# Patient Record
Sex: Female | Born: 1975
Health system: Southern US, Community
[De-identification: ages and names within clinical notes are randomized; demographics above are authoritative.]

## PROBLEM LIST (undated history)

## (undated) DIAGNOSIS — R569 Unspecified convulsions: Secondary | ICD-10-CM

## (undated) DIAGNOSIS — I619 Nontraumatic intracerebral hemorrhage, unspecified: Secondary | ICD-10-CM

## (undated) DIAGNOSIS — D649 Anemia, unspecified: Secondary | ICD-10-CM

## (undated) DIAGNOSIS — I639 Cerebral infarction, unspecified: Secondary | ICD-10-CM

## (undated) NOTE — *Deleted (*Deleted)
ANTICOAGULATION CONSULT NOTE  Pharmacy Consult for Warfarin with Lovenox bridge Indication: Dural venous sinus thrombosis  No Known Allergies  Patient Measurements: Height: 5' (152.4 cm) Weight: 66.2 kg (145 lb 15.1 oz) IBW/kg (Calculated) : 45.5 Heparin Dosing Weight: 59.7 kg  Vital Signs: Temp: 99.8 F (37.7 C) (10/20 0822) Temp Source: Oral (10/20 0822) BP: 145/76 (10/20 0900) Pulse Rate: 41 (10/20 0900)  Labs: Recent Labs    04/06/20 0735 04/06/20 0735 04/06/20 2242 04/07/20 0055 04/07/20 0602 04/07/20 0850 04/08/20 0526  HGB 7.2*   < >  --   --  10.7*  --  10.5*  HCT 24.7*  --   --   --  34.1*  --  32.6*  PLT 462*  --   --   --  422*  --  387  HEPARINUNFRC 0.80*  --  0.28*  --   --  0.28*  --   CREATININE 0.62  --   --  0.62  --   --  0.58   < > = values in this interval not displayed.    Estimated Creatinine Clearance: 75.4 mL/min (by C-G formula based on SCr of 0.58 mg/dL).   Medical History: Past Medical History:  Diagnosis Date  . 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 INR 1.8 on admission which was reversed with Vit K. Currently on treatment dose Lovenox. Now resuming warfarin    H/H low stable, Plt wnl. INR ***  Home warfarin dose: 4 mg daily   Goal of Therapy:  INR 2-3 Monitor platelets by anticoagulation protocol: Yes   Plan:  Lovenox 65 mg (1 mg/kg) twice daily  Warfarin *** Monitor daily CBC and for s/s of bleeding    Vinnie Level, PharmD., BCPS, BCCCP Clinical Pharmacist Please refer to [facility] for unit-specific pharmacist

---

## 2004-05-18 ENCOUNTER — Ambulatory Visit (HOSPITAL_COMMUNITY): Admission: RE | Admit: 2004-05-18 | Discharge: 2004-05-18 | Payer: Self-pay | Admitting: Obstetrics and Gynecology

## 2004-09-10 ENCOUNTER — Ambulatory Visit: Payer: Self-pay

## 2005-04-01 ENCOUNTER — Observation Stay: Payer: Self-pay

## 2005-04-02 ENCOUNTER — Inpatient Hospital Stay: Payer: Self-pay

## 2007-05-05 ENCOUNTER — Emergency Department: Payer: Self-pay | Admitting: Emergency Medicine

## 2008-10-14 ENCOUNTER — Ambulatory Visit: Payer: Self-pay | Admitting: Family Medicine

## 2010-06-22 ENCOUNTER — Ambulatory Visit: Payer: Self-pay | Admitting: Family Medicine

## 2010-07-02 ENCOUNTER — Inpatient Hospital Stay (HOSPITAL_COMMUNITY)
Admission: AD | Admit: 2010-07-02 | Discharge: 2010-07-02 | Payer: Self-pay | Source: Home / Self Care | Attending: Obstetrics & Gynecology | Admitting: Obstetrics & Gynecology

## 2010-07-05 LAB — URINALYSIS, ROUTINE W REFLEX MICROSCOPIC
Bilirubin Urine: NEGATIVE
Ketones, ur: NEGATIVE mg/dL
Leukocytes, UA: NEGATIVE
Nitrite: NEGATIVE
Protein, ur: NEGATIVE mg/dL
Specific Gravity, Urine: 1.015 (ref 1.005–1.030)
Urine Glucose, Fasting: NEGATIVE mg/dL
Urobilinogen, UA: 0.2 mg/dL (ref 0.0–1.0)
pH: 6.5 (ref 5.0–8.0)

## 2010-07-05 LAB — URINE MICROSCOPIC-ADD ON

## 2010-07-05 LAB — POCT PREGNANCY, URINE: Preg Test, Ur: POSITIVE

## 2010-09-13 ENCOUNTER — Encounter: Payer: Self-pay | Admitting: Obstetrics and Gynecology

## 2011-02-03 ENCOUNTER — Observation Stay: Payer: Self-pay

## 2011-02-12 ENCOUNTER — Inpatient Hospital Stay: Payer: Self-pay

## 2013-09-13 ENCOUNTER — Ambulatory Visit: Payer: Self-pay | Admitting: Family Medicine

## 2020-03-25 ENCOUNTER — Emergency Department
Admission: EM | Admit: 2020-03-25 | Discharge: 2020-03-25 | Disposition: A | Discharge status: Home / Self Care | Payer: Medicaid Other | Attending: Emergency Medicine | Admitting: Emergency Medicine

## 2020-03-25 ENCOUNTER — Other Ambulatory Visit (HOSPITAL_COMMUNITY): Payer: Self-pay

## 2020-03-25 ENCOUNTER — Inpatient Hospital Stay (HOSPITAL_COMMUNITY): Payer: Medicaid Other

## 2020-03-25 ENCOUNTER — Emergency Department: Payer: Medicaid Other

## 2020-03-25 ENCOUNTER — Other Ambulatory Visit: Payer: Self-pay

## 2020-03-25 ENCOUNTER — Emergency Department (HOSPITAL_COMMUNITY): Payer: Medicaid Other

## 2020-03-25 ENCOUNTER — Inpatient Hospital Stay (HOSPITAL_COMMUNITY)
Admission: EM | Admit: 2020-03-25 | Discharge: 2020-03-31 | DRG: 064 | Disposition: A | Discharge status: Rehabilitation Facility | Payer: Medicaid Other | Source: Other Acute Inpatient Hospital | Attending: Neurology | Admitting: Neurology

## 2020-03-25 DIAGNOSIS — Z20822 Contact with and (suspected) exposure to covid-19: Secondary | ICD-10-CM | POA: Diagnosis present

## 2020-03-25 DIAGNOSIS — Z7289 Other problems related to lifestyle: Secondary | ICD-10-CM

## 2020-03-25 DIAGNOSIS — E663 Overweight: Secondary | ICD-10-CM | POA: Diagnosis not present

## 2020-03-25 DIAGNOSIS — J690 Pneumonitis due to inhalation of food and vomit: Secondary | ICD-10-CM

## 2020-03-25 DIAGNOSIS — G08 Intracranial and intraspinal phlebitis and thrombophlebitis: Secondary | ICD-10-CM | POA: Diagnosis present

## 2020-03-25 DIAGNOSIS — Z6828 Body mass index (BMI) 28.0-28.9, adult: Secondary | ICD-10-CM

## 2020-03-25 DIAGNOSIS — I619 Nontraumatic intracerebral hemorrhage, unspecified: Secondary | ICD-10-CM

## 2020-03-25 DIAGNOSIS — E785 Hyperlipidemia, unspecified: Secondary | ICD-10-CM | POA: Diagnosis not present

## 2020-03-25 DIAGNOSIS — R42 Dizziness and giddiness: Secondary | ICD-10-CM | POA: Insufficient documentation

## 2020-03-25 DIAGNOSIS — I612 Nontraumatic intracerebral hemorrhage in hemisphere, unspecified: Secondary | ICD-10-CM

## 2020-03-25 DIAGNOSIS — R4182 Altered mental status, unspecified: Secondary | ICD-10-CM | POA: Diagnosis present

## 2020-03-25 DIAGNOSIS — I611 Nontraumatic intracerebral hemorrhage in hemisphere, cortical: Secondary | ICD-10-CM | POA: Diagnosis present

## 2020-03-25 DIAGNOSIS — H5347 Heteronymous bilateral field defects: Secondary | ICD-10-CM | POA: Diagnosis not present

## 2020-03-25 DIAGNOSIS — I677 Cerebral arteritis, not elsewhere classified: Secondary | ICD-10-CM | POA: Insufficient documentation

## 2020-03-25 DIAGNOSIS — R110 Nausea: Secondary | ICD-10-CM | POA: Diagnosis not present

## 2020-03-25 DIAGNOSIS — D649 Anemia, unspecified: Secondary | ICD-10-CM | POA: Diagnosis present

## 2020-03-25 DIAGNOSIS — G936 Cerebral edema: Secondary | ICD-10-CM | POA: Diagnosis present

## 2020-03-25 DIAGNOSIS — D6859 Other primary thrombophilia: Secondary | ICD-10-CM | POA: Diagnosis present

## 2020-03-25 DIAGNOSIS — H532 Diplopia: Secondary | ICD-10-CM | POA: Diagnosis present

## 2020-03-25 DIAGNOSIS — G441 Vascular headache, not elsewhere classified: Secondary | ICD-10-CM | POA: Diagnosis not present

## 2020-03-25 DIAGNOSIS — R569 Unspecified convulsions: Secondary | ICD-10-CM | POA: Diagnosis present

## 2020-03-25 DIAGNOSIS — R2689 Other abnormalities of gait and mobility: Secondary | ICD-10-CM | POA: Diagnosis not present

## 2020-03-25 DIAGNOSIS — I636 Cerebral infarction due to cerebral venous thrombosis, nonpyogenic: Principal | ICD-10-CM | POA: Diagnosis present

## 2020-03-25 DIAGNOSIS — I6389 Other cerebral infarction: Secondary | ICD-10-CM | POA: Diagnosis not present

## 2020-03-25 DIAGNOSIS — R519 Headache, unspecified: Secondary | ICD-10-CM | POA: Diagnosis present

## 2020-03-25 DIAGNOSIS — E876 Hypokalemia: Secondary | ICD-10-CM | POA: Diagnosis present

## 2020-03-25 DIAGNOSIS — R197 Diarrhea, unspecified: Secondary | ICD-10-CM | POA: Diagnosis not present

## 2020-03-25 DIAGNOSIS — I823 Embolism and thrombosis of renal vein: Secondary | ICD-10-CM | POA: Diagnosis present

## 2020-03-25 DIAGNOSIS — R7401 Elevation of levels of liver transaminase levels: Secondary | ICD-10-CM | POA: Diagnosis not present

## 2020-03-25 DIAGNOSIS — I8290 Acute embolism and thrombosis of unspecified vein: Secondary | ICD-10-CM

## 2020-03-25 DIAGNOSIS — E782 Mixed hyperlipidemia: Secondary | ICD-10-CM | POA: Diagnosis not present

## 2020-03-25 DIAGNOSIS — I610 Nontraumatic intracerebral hemorrhage in hemisphere, subcortical: Secondary | ICD-10-CM | POA: Diagnosis not present

## 2020-03-25 HISTORY — DX: Intracranial and intraspinal phlebitis and thrombophlebitis: G08

## 2020-03-25 HISTORY — DX: Nontraumatic intracerebral hemorrhage, unspecified: I61.9

## 2020-03-25 LAB — CBC WITH DIFFERENTIAL/PLATELET
Abs Immature Granulocytes: 0.03 10*3/uL (ref 0.00–0.07)
Basophils Absolute: 0 10*3/uL (ref 0.0–0.1)
Basophils Relative: 0 %
Eosinophils Absolute: 0 10*3/uL (ref 0.0–0.5)
Eosinophils Relative: 0 %
HCT: 28.7 % — ABNORMAL LOW (ref 36.0–46.0)
Hemoglobin: 9.2 g/dL — ABNORMAL LOW (ref 12.0–15.0)
Immature Granulocytes: 0 %
Lymphocytes Relative: 22 %
Lymphs Abs: 2.2 10*3/uL (ref 0.7–4.0)
MCH: 22.5 pg — ABNORMAL LOW (ref 26.0–34.0)
MCHC: 32.1 g/dL (ref 30.0–36.0)
MCV: 70.2 fL — ABNORMAL LOW (ref 80.0–100.0)
Monocytes Absolute: 0.5 10*3/uL (ref 0.1–1.0)
Monocytes Relative: 5 %
Neutro Abs: 7.2 10*3/uL (ref 1.7–7.7)
Neutrophils Relative %: 73 %
Platelets: 422 10*3/uL — ABNORMAL HIGH (ref 150–400)
RBC: 4.09 MIL/uL (ref 3.87–5.11)
RDW: 18.7 % — ABNORMAL HIGH (ref 11.5–15.5)
WBC: 9.9 10*3/uL (ref 4.0–10.5)
nRBC: 0 % (ref 0.0–0.2)

## 2020-03-25 LAB — GLUCOSE, CAPILLARY: Glucose-Capillary: 95 mg/dL (ref 70–99)

## 2020-03-25 LAB — APTT: aPTT: 24 seconds (ref 24–36)

## 2020-03-25 LAB — COMPREHENSIVE METABOLIC PANEL
ALT: 55 U/L — ABNORMAL HIGH (ref 0–44)
AST: 48 U/L — ABNORMAL HIGH (ref 15–41)
Albumin: 3.9 g/dL (ref 3.5–5.0)
Alkaline Phosphatase: 53 U/L (ref 38–126)
Anion gap: 9 (ref 5–15)
BUN: 10 mg/dL (ref 6–20)
CO2: 20 mmol/L — ABNORMAL LOW (ref 22–32)
Calcium: 8.8 mg/dL — ABNORMAL LOW (ref 8.9–10.3)
Chloride: 106 mmol/L (ref 98–111)
Creatinine, Ser: 0.6 mg/dL (ref 0.44–1.00)
GFR calc non Af Amer: >60 mL/min (ref >60)
Glucose, Bld: 99 mg/dL (ref 70–99)
Potassium: 3.2 mmol/L — ABNORMAL LOW (ref 3.5–5.1)
Sodium: 135 mmol/L (ref 135–145)
Total Bilirubin: 0.4 mg/dL (ref 0.3–1.2)
Total Protein: 7.4 g/dL (ref 6.5–8.1)

## 2020-03-25 LAB — PROTIME-INR
INR: 1.1 (ref 0.8–1.2)
Prothrombin Time: 13.5 seconds (ref 11.4–15.2)

## 2020-03-25 LAB — ANTITHROMBIN III: AntiThromb III Func: 100 % (ref 75–120)

## 2020-03-25 MED ORDER — CLEVIDIPINE BUTYRATE 0.5 MG/ML IV EMUL: 0.0000 mg/h | INTRAVENOUS | Status: DC

## 2020-03-25 MED ORDER — FENTANYL CITRATE (PF) 100 MCG/2ML IJ SOLN
50.0000 ug | INTRAMUSCULAR | Status: DC | PRN
  Administered 2020-03-25 (×2): 50 ug via INTRAVENOUS
  Filled 2020-03-25 (×2): qty 2

## 2020-03-25 MED ORDER — LORAZEPAM 2 MG/ML IJ SOLN
INTRAMUSCULAR | Status: AC
  Filled 2020-03-25: qty 1

## 2020-03-25 MED ORDER — HEPARIN (PORCINE) 25000 UT/250ML-% IV SOLN
900.0000 [IU]/h | INTRAVENOUS | Status: DC
  Administered 2020-03-25: 900 [IU]/h via INTRAVENOUS
  Filled 2020-03-25: qty 250

## 2020-03-25 MED ORDER — SENNOSIDES-DOCUSATE SODIUM 8.6-50 MG PO TABS
1.0000 | ORAL_TABLET | Freq: Two times a day (BID) | ORAL | Status: DC
  Administered 2020-03-26 – 2020-03-31 (×10): 1 via ORAL
  Filled 2020-03-25 (×10): qty 1

## 2020-03-25 MED ORDER — ACETAMINOPHEN 160 MG/5ML PO SOLN: 650.0000 mg | ORAL | Status: DC | PRN

## 2020-03-25 MED ORDER — LORAZEPAM 2 MG/ML IJ SOLN
2.0000 mg | Freq: Once | INTRAMUSCULAR | Status: AC
  Administered 2020-03-25: 2 mg via INTRAVENOUS

## 2020-03-25 MED ORDER — PANTOPRAZOLE SODIUM 40 MG IV SOLR
40.0000 mg | Freq: Every day | INTRAVENOUS | Status: DC
  Administered 2020-03-26: 40 mg via INTRAVENOUS
  Filled 2020-03-25: qty 40

## 2020-03-25 MED ORDER — ACETAMINOPHEN 325 MG PO TABS
650.0000 mg | ORAL_TABLET | ORAL | Status: DC | PRN
  Administered 2020-03-26 – 2020-03-31 (×17): 650 mg via ORAL
  Filled 2020-03-25 (×17): qty 2

## 2020-03-25 MED ORDER — ACETAMINOPHEN 325 MG PO TABS: 650.0000 mg | ORAL_TABLET | Freq: Four times a day (QID) | ORAL | Status: DC

## 2020-03-25 MED ORDER — POTASSIUM CHLORIDE 10 MEQ/100ML IV SOLN
10.0000 meq | INTRAVENOUS | Status: AC
  Administered 2020-03-25 – 2020-03-26 (×4): 10 meq via INTRAVENOUS
  Filled 2020-03-25 (×4): qty 100

## 2020-03-25 MED ORDER — ACETAMINOPHEN 650 MG RE SUPP: 650.0000 mg | RECTAL | Status: DC | PRN

## 2020-03-25 MED ORDER — FENTANYL CITRATE (PF) 100 MCG/2ML IJ SOLN
50.0000 ug | Freq: Once | INTRAMUSCULAR | Status: AC
Start: 2020-03-25 — End: 2020-03-25
  Administered 2020-03-25: 50 ug via INTRAVENOUS
  Filled 2020-03-25: qty 2

## 2020-03-25 MED ORDER — GADOBUTROL 1 MMOL/ML IV SOLN
6.0000 mL | Freq: Once | INTRAVENOUS | Status: AC | PRN
  Administered 2020-03-25: 6 mL via INTRAVENOUS

## 2020-03-25 MED ORDER — LEVETIRACETAM IN NACL 500 MG/100ML IV SOLN
500.0000 mg | Freq: Two times a day (BID) | INTRAVENOUS | Status: DC
  Administered 2020-03-26 – 2020-03-27 (×3): 500 mg via INTRAVENOUS
  Filled 2020-03-25 (×3): qty 100

## 2020-03-25 MED ORDER — LABETALOL HCL 5 MG/ML IV SOLN: 20.0000 mg | Freq: Once | INTRAVENOUS | Status: DC

## 2020-03-25 MED ORDER — ONDANSETRON HCL 4 MG/2ML IJ SOLN
4.0000 mg | Freq: Once | INTRAMUSCULAR | Status: AC
  Administered 2020-03-25: 4 mg via INTRAVENOUS
  Filled 2020-03-25: qty 2

## 2020-03-25 MED ORDER — STROKE: EARLY STAGES OF RECOVERY BOOK: Freq: Once | Status: DC

## 2020-03-25 NOTE — ED Provider Notes (Signed)
[facility] Emergency Department Provider Note  Time seen: 2:42 PM  I have reviewed the triage vital signs and the nursing notes. Spanish interpreter used for this evaluation  HISTORY  Chief Complaint Headache   HPI LEANNE SISLER is a 44 y.o. female with no past medical history who presents to the emergency department for 3 days a headache along with dizziness this morning.   According to the patient for the past 3 days she has been experiencing a moderate to significant headache, has been using over-the-counter medications without relief.  Patient states this morning the pain became worse along with blurred vision from the right eye, dizziness.  Patient denies any vomiting.  Denies any fever.  No focal weakness or numbness.  No difficulty speaking thinking or ambulating per patient.  History reviewed. No pertinent past medical history.  There are no problems to display for this patient.   History reviewed. No pertinent surgical history.  Prior to Admission medications   Not on File    No Known Allergies  No family history on file.  Social History Social History   Tobacco Use  . Smoking status: Never Smoker  . Smokeless tobacco: Never Used  Substance Use Topics  . Alcohol use: Not Currently  . Drug use: Never    Review of Systems Constitutional: Negative for fever.  Positive for dizziness. Cardiovascular: Negative for chest pain. Respiratory: Negative for shortness of breath. Gastrointestinal: Negative for abdominal pain, vomiting  Musculoskeletal: Negative for musculoskeletal complaints Skin: Negative for skin complaints  Neurological: Significant headache x3 days, worse this morning All other ROS negative  ____________________________________________   PHYSICAL EXAM:  VITAL SIGNS: ED Triage Vitals  Enc Vitals Group     BP 03/25/20 1228 124/63     Pulse Rate 03/25/20 1228 68     Resp 03/25/20 1228 20     Temp 03/25/20 1228 98.9  F (37.2 C)     Temp Source 03/25/20 1228 Oral     SpO2 03/25/20 1228 100 %     Weight 03/25/20 1239 140 lb (63.5 kg)     Height 03/25/20 1239 4' 11.06" (1.5 m)     Head Circumference --      Peak Flow --      Pain Score 03/25/20 1239 8     Pain Loc --      Pain Edu? --      Excl. in [place]? --     Constitutional: Alert and oriented. Well appearing and in no distress. Eyes: Normal exam ENT      Head: Normocephalic and atraumatic.      Mouth/Throat: Mucous membranes are moist. Cardiovascular: Normal rate, regular rhythm. No murmur Respiratory: Normal respiratory effort without tachypnea nor retractions. Breath sounds are clear  Gastrointestinal: Soft and nontender. No distention.   Musculoskeletal: Nontender with normal range of motion in all extremities.  Neurologic:  Normal speech and language. No gross focal neurologic deficits Skin:  Skin is warm, dry and intact.  Psychiatric: Mood and affect are normal.  ____________________________________________    EKG  EKG viewed and interpreted by myself shows a normal sinus rhythm at 63 bpm with a narrow QRS, normal axis, normal intervals, nonspecific ST changes.  ____________________________________________    RADIOLOGY  CT scan of the head shows hemorrhagic process in the left parietal brain with surrounding edema.  MRI of the brain shows what appears to be most consistent with dural sinus thrombosis with hemorrhagic conversion of an infarct  ____________________________________________  INITIAL IMPRESSION / ASSESSMENT AND PLAN / ED COURSE  Pertinent labs & imaging results that were available during my care of the patient were reviewed by me and considered in my medical decision making (see chart for details).   Patient presents to the emergency department for 3 days of headache acutely worse today along with dizziness.  CT scan of the head is concerning for possible hemorrhagic process recommend MRI.  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  Spoke to neurosurgery as well as neurology.  Neurosurgery (Dr. Cari Caraway) has been down to see the patient.  Neurology recommends transfer to neuro ICU at [facility].  She is currently speaking to her colleague [facility] to help arrange for transfer.  Neurology consultation pending for further recommendations.  Patient care signed out to Dr. Archie Balboa.  Maudean I Fasig was evaluated in Emergency Department on 03/25/2020 for the symptoms described in the history of present illness. She was evaluated in the context of the global COVID-19 pandemic, which necessitated consideration that the patient might be at risk for infection with the SARS-CoV-2 virus that causes COVID-19. Institutional protocols and algorithms that pertain to the evaluation of patients at risk for COVID-19 are in a state of rapid change based on information released by regulatory bodies including the CDC and federal and state organizations. These policies and algorithms were followed during the patient's care in the ED.  CRITICAL CARE Performed by: Harvest Dark   Total critical care time: 30 minutes  Critical care time was exclusive of separately billable procedures and treating other patients.  Critical care was necessary to treat or prevent imminent or life-threatening deterioration.  Critical care was time spent personally by me on the following activities: development of treatment plan with patient and/or surrogate as well as nursing, discussions with consultants, evaluation of patient's response to treatment, examination of patient, obtaining history from patient or surrogate, ordering and performing treatments and interventions, ordering and review of laboratory studies, ordering and review of radiographic studies, pulse oximetry and re-evaluation of patient's  condition.  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

## 2020-03-25 NOTE — Progress Notes (Signed)
vLTM setup same leads used for baseline EEG today.  Neurology notified.  Event button tested.

## 2020-03-25 NOTE — ED Notes (Signed)
Dr. Ralene Bathe noted pt to be seizing, 2mg  ativan and 1500mg  keppra given IV per verbal order. NRB applied, pt with snoring respirations, pt transported to CT with this RN and Dr. Ralene Bathe.

## 2020-03-25 NOTE — ED Triage Notes (Signed)
Pt arrived via [facility] from [facility], pt with headache. Heparin infusing @ 900 units/hr. Pt alert, having difficulty following commands.

## 2020-03-25 NOTE — Progress Notes (Cosign Needed)
EEG complete - results pending 

## 2020-03-25 NOTE — ED Triage Notes (Signed)
Pt to the er for headache x 3 days. Pt has been taking IBU with no relief. Pt states pain is sever and has difficulty with vision in the right eye. No medical hx

## 2020-03-25 NOTE — Consult Note (Signed)
Referring Physician:  No referring provider defined for this encounter.  Primary Physician:  [facility]  Chief Complaint:  headache  History of Present Illness: 03/25/2020 Erica Rosario is a 44 y.o. female who presents with the chief complaint of headache for several days, worsening today.  She was brought in for evaluation, and found to have a likely venous infarct and concern for dural sinus thrombosis of the superior sagittal sinus.  She reports some issues with her vision on the R, but feels her thinking is normal. She does report nausea and vomiting.   Review of Systems:  A 10 point review of systems is negative, except for the pertinent positives and negatives detailed in the HPI.  Past Medical History: History reviewed. No pertinent past medical history.  Past Surgical History: History reviewed. No pertinent surgical history.  Allergies: Allergies as of 03/25/2020  . (No Known Allergies)    Medications: No current facility-administered medications for this encounter. No current outpatient medications on file.   Social History: Social History   Tobacco Use  . Smoking status: Never Smoker  . Smokeless tobacco: Never Used  Substance Use Topics  . Alcohol use: Not Currently  . Drug use: Never    Family Medical History: No family history on file.  Physical Examination: Vitals:   03/25/20 1320 03/25/20 1441  BP: 113/69 (!) 113/54  Pulse: (!) 58 61  Resp: 13 17  Temp:    SpO2: 100% 100%     General: Patient is well developed, well nourished, calm, collected, and in mild apparent distress.  Psychiatric: Patient is non-anxious.  Head:  Pupils equal, round, and reactive to light.  ENT:  Oral mucosa appears well hydrated.  Neck:   Supple.  Full range of motion.  Respiratory: Patient is breathing without any difficulty.  Extremities: No edema.  Vascular: Palpable pulses in dorsal pedal vessels.  Skin:   On exposed  skin, there are no abnormal skin lesions.  NEUROLOGICAL:  General: In mild distress.  OE spontaneously. Follows commands. Awake, alert, oriented to person, place, and time.  Pupils equal round and reactive to light.  Facial tone is symmetric.  Tongue protrusion is midline.  There is no pronator drift.  VF shows deficit in the mid and lower R visual fields of both eyes.  Strength: Side Biceps Triceps Deltoid Interossei Grip Wrist Ext. Wrist Flex.  R 5 5 5 5 5 5 5   L 5 5 5 5 5 5 5    Side Iliopsoas Quads Hamstring PF DF EHL  R 5 5 5 5 5 5   L 5 5 5 5 5 5    Reflexes are 1+ and symmetric at the biceps, triceps, brachioradialis, patella and achilles.   Bilateral upper and lower extremity sensation is intact to light touch.  Gait is untested. Hoffman's is absent.  Imaging: MRI brain 03/25/20 IMPRESSION: 1. Thrombosis of the posterior aspect of the superior sagittal sinus with hemorrhagic transformation of a venous infarct in the left occipital lobe, as detailed above. There is thrombosis of a draining cortical vein in the region of hemorrhage, as detailed above. 2. There are two rounded areas of enhancement within the left occipital hemorrhage which may represent active/ongoing hemorrhage. 3. When compared to same day CT, similar surrounding edema/mass effect with slight rightward midline shift.  Critical Value/emergent results were called by telephone at the time of interpretation on 03/25/2020 at 2:38 pm to provider [facility] , who verbally acknowledged these results.  Electronically Signed   By: Margaretha Sheffield MD   On: 03/25/2020 14:51  I have personally reviewed the images and agree with the above interpretation.  Labs: CBC Latest Ref Rng & Units 03/25/2020  WBC 4.0 - 10.5 K/uL 9.9  Hemoglobin 12.0 - 15.0 g/dL 9.2(L)  Hematocrit 36 - 46 % 28.7(L)  Platelets 150 - 400 K/uL 422(H)       Assessment and Plan: [name] is a pleasant 43 y.o. 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 She has a R VF cut.  I defer management to my colleagues in neurology.  Even with evidence of intraparenchymal hemorrhage, therapeutic anticoagulation is typically the recommended treatment, though I defer to neurology for definitive management.    I would not recommend surgical intervention.    Per the neurologist's recommendation, the patient may end up transferring to a dedicated neuro ICU.   Mianna Iezzi K. Izora Ribas MD, [place] Dept. of Neurosurgery

## 2020-03-25 NOTE — ED Notes (Signed)
Called [facility] spoke with [place] ref. Potential transfer

## 2020-03-25 NOTE — Consult Note (Addendum)
Neurology Consultation Reason for Consult: CVST  Referring Physician: Dr. Harvest Dark  CC: Headache   History is obtained from: Patient and chart review   HPI: Erica Rosario is a 44 y.o. female with no significant past medical history.  She has been having headaches for the past 3 days with associated neck stiffness.  The headaches do wake her up at night out of sleep.  She does think that they are better laying down than sitting up.  She has been having some blurry vision and some double vision when looking to the right.  She denies any pulsatile tinnitus.  She denies any fevers, chills, sweats, cough, cold, rashes, urinary symptoms, bowel or bladder incontinence, constipation.  She denies any prior history of clotting disorder.  She denies any family history of clotting disorder.  She does not think she is pregnant but her next menstrual period is due to start in the next 3 days.  She states she is not sexually active.  She is not taking any medications other than pain medications for the headache which she states do help.  She received 2 doses of the Pfizer vaccine for COVID-19 back in March 2021  LKW: 3 days ago  tPA given?: No, due to hemorrhage  Premorbid modified rankin scale:      0 - No symptoms.  ICH Score: 0  Time performed: 5:00 pm on 10/6 GCS: 13-15 is 0 points Infratentorial: No.. If yes, 1 point Volume: <30cc is 0 points  Age: 44 y.o.. >80 is 1 point Intraventricular extension is 1 point A Score of 0 points has a 30 day mortality of 0%. Stroke. 2001 Apr;32(4):891-7.   ROS: A 14 point ROS was performed and is negative except as noted in the HPI.  History reviewed. No pertinent past medical history.  No family history on file. She specifically denies any family history of clotting disorder She reports her mother passed away of a stroke at age 49  Social History:  reports that she has never smoked. She has never used smokeless tobacco. She reports previous alcohol  use. She reports that she does not use drugs.   Exam: Current vital signs: BP (!) 113/54   Pulse 61   Temp 98.9 F (37.2 C) (Oral)   Resp 17   Ht 4' 11.06" (1.5 m)   Wt 63.5 kg   LMP 02/24/2020   SpO2 100%   BMI 28.22 kg/m  Vital signs in last 24 hours: Temp:  [98.9 F (37.2 C)] 98.9 F (37.2 C) (10/06 1228) Pulse Rate:  [58-68] 61 (10/06 1441) Resp:  [13-20] 17 (10/06 1441) BP: (113-124)/(54-69) 113/54 (10/06 1441) SpO2:  [100 %] 100 % (10/06 1441) Weight:  [63.5 kg] 63.5 kg (10/06 1239)   Physical Exam  Constitutional: Appears well-developed and well-nourished.  Psych: Affect appropriate to situation Eyes: No scleral injection HENT: No OP obstrucion MSK: no joint deformities.  Cardiovascular: Normal rate and regular rhythm.  Respiratory: Effort normal, non-labored breathing GI: Soft.  No distension. There is no tenderness.  Skin: WDI  Neuro: Mental Status: Patient is awake, alert, oriented to person, place, month, year, and situation. Patient is able to give a clear and coherent history. No signs of aphasia or neglect Cranial Nerves: II: Visual Fields are full. Pupils are equal, round, and reactive to light.   III,IV, VI: EOMI without ptosis; mild diplopia on right gaze  V: Facial sensation is symmetric to temperature VII: Facial movement is symmetric.  VIII: hearing is  intact to voice X: Uvula elevates symmetrically XI: Shoulder shrug is symmetric. XII: tongue is midline without atrophy or fasciculations.  Motor: Tone is normal. Bulk is normal. 5/5 strength was present in all four extremities.  Sensory: Sensation is symmetric to light touch and temperature in the arms and legs. Deep Tendon Reflexes: 2+ and symmetric in the biceps and patellae.  Cerebellar: FNF and HKS are intact bilaterally   I have reviewed labs in epic and the results pertinent to this consultation are: PT 13.5 INR 1.1 PTT 24 Hemoglobin 9.2, microcytic anemia, elevated RDW,   platelets 422  I have personally reviewed the images obtained:  Head CT with hemorrhage not respecting a typical vascular distribution MRI brain with without contrast with cerebral venous thrombosis underlying a stable hemorrhage  Impression: 44 year old woman with no significant past medical history presenting with a cerebral venous sinus thrombosis creating hemorrhage.  Patient was counseled that despite the cerebral hemorrhage she will need heparin to resolve the hemorrhage as the etiology of the hemorrhage is poor cerebral blood flow drainage due to the clot.  Hypercoagulable panel was collected prior to initiating heparin to optimize diagnostic yield.  At this time she does not report any subacute symptoms and her basic laboratory symptoms are reassuring, so I have not immediately pursued malignancy work-up, though this should be considered if the other work-up is unrevealing  Recommendations:  # CVST  -Neuro ICU admission to the stroke service at [facility] discussed with Dr. Cheral Marker and Dr. Malen Gauze -Systolic blood pressure goal less than 140 -Hypercoagulable panel -Follow-up pregnancy test -B12 and MMA given elevated RDW -Low goal no bolus heparin drip requested via pharmacy consult -Every hour neurological checks -Repeat head CT at 8 PM  # Pain control  -Reasonable to temporarily control headaches with opiates as needed, but please avoid high doses to avoid clotting neurological exam -Schedule Tylenol 650 mg every 6 hours for 2 days to help with multimodal pain control and reduce opiate burden  Patient is Spanish-speaking only and an interpreter was used to aid in performing the examination as well as explaining the diagnosis and treatment plan.  All of the patient's questions were answered. 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

## 2020-03-25 NOTE — H&P (Signed)
Neurology H&P - [place] admission   CC: headache  History is obtained from: Chart, tele neurology consult at [facility]  HPI: Erica Rosario is a 44 y.o. female no PMH, presented to [facility] regional hospital this afternoon with complaints of headache and neck stiffness.  A code stroke was activated-she was seen by Dr. Curly Shores of our service.  She reported that headaches had been going on for 3 days with associated neck stiffness.  Headaches did wake her up at nights.  Headaches were better laying down and worsened and standing up.  Also complained of blurred and double vision mostly looking to the right.  No tinnitus.  No fevers chills cough cold rashes urinary symptoms bowel or bladder incontinence.  No bleeding or clotting disorders.  No family history of clotting disorders.  Not sexually active.  Not taking any medications other than the pain medications headache. Has been vaccinated against COVID-19 in March 2021.  Brain imaging revealed ICH which is close to the superior left parietal lobe in proximity with the venous drainage making suspicion for a venous infarct that might have bled. MRI brain confirmed the bleed and also revealed a cerebral venous thrombosis on superior sagittal sinus higher up with a thrombus also in the draining cortical vein.  Transferred from the ER at [facility] to ER at [facility] as no ICU beds are available due to critical national shortage of beds.  I was asked to see and admit the patient.  As she was being evaluated by the ED providers in the ED at [facility], she had a witnessed tonic-clonic seizure for which she received 2 mg of Ativan and was loaded with Keppra.  On initial examination by ED providers here, she was not consistently following commands and was drowsy.  Examination at [facility] was essentially unremarkable and nearly normal.   Repeat head CT at [facility] showed essentially stable hemorrhagic transformed venous infarction in the  left parietal region with a possible single new focus of hemorrhage at the vertex along with somewhat hyperdense superior sagittal sinus.   LKW: 3 days ago tpa given?: no, ICH Premorbid modified Rankin scale (mRS): 0  ROS: A 14 point ROS was performed and is negative except as noted in the HPI.  Unable to obtain due to altered mental status.   No past medical history on file.  No family history on file.   Social History:   reports that she has never smoked. She has never used smokeless tobacco. She reports previous alcohol use. She reports that she does not use drugs.  Medications  Current Facility-Administered Medications:  .  LORazepam (ATIVAN) 2 MG/ML injection, , , ,  .  LORazepam (ATIVAN) injection 2 mg, 2 mg, Intravenous, Once, Quintella Reichert, MD No current outpatient medications on file.   Exam: Current vital signs: BP 112/64 (BP Location: Left Arm)   Pulse 88   Resp (!) 24   LMP 02/24/2020   SpO2 98%  Vital signs in last 24 hours: Temp:  [98 F (36.7 C)-98.9 F (37.2 C)] 98 F (36.7 C) (10/06 2041) Pulse Rate:  [50-88] 88 (10/06 2140) Resp:  [13-26] 24 (10/06 2140) BP: (108-128)/(54-69) 112/64 (10/06 2140) SpO2:  [97 %-100 %] 98 % (10/06 2140) Weight:  [63.5 kg] 63.5 kg (10/06 1239) I saw the patient immediately after she had a seizure and was given Ativan. General: Obtunded, responds only to noxious stimulation as below. HEENT: Normocephalic atraumatic CVS: Regular rate rhythm Respiratory: Maintaining airway, breathing  well saturating normally on room air with normal breath sounds. Abdomen nondistended nontender Extremities warm well perfused Neurological exam She is obtunded, does not open eyes to voice or noxious stimulation. Does not follow any commands Cranial nerves: Pupils equal round reactive to light, extraocular movements difficult to assess, no obvious gaze deviation or preference, face appears symmetric. Motor exam: To noxious immolation,  there is strong withdrawal on the left but a weaker withdrawal on the right. Sensory exam: As above Coordination cannot be assessed   Labs I have reviewed labs in epic and the results pertinent to this consultation are:  CBC    Component Value Date/Time   WBC 9.9 03/25/2020 1229   RBC 4.09 03/25/2020 1229   HGB 9.2 (L) 03/25/2020 1229   HCT 28.7 (L) 03/25/2020 1229   PLT 422 (H) 03/25/2020 1229   MCV 70.2 (L) 03/25/2020 1229   MCH 22.5 (L) 03/25/2020 1229   MCHC 32.1 03/25/2020 1229   RDW 18.7 (H) 03/25/2020 1229   LYMPHSABS 2.2 03/25/2020 1229   MONOABS 0.5 03/25/2020 1229   EOSABS 0.0 03/25/2020 1229   BASOSABS 0.0 03/25/2020 1229    CMP     Component Value Date/Time   NA 135 03/25/2020 1229   K 3.2 (L) 03/25/2020 1229   CL 106 03/25/2020 1229   CO2 20 (L) 03/25/2020 1229   GLUCOSE 99 03/25/2020 1229   BUN 10 03/25/2020 1229   CREATININE 0.60 03/25/2020 1229   CALCIUM 8.8 (L) 03/25/2020 1229   PROT 7.4 03/25/2020 1229   ALBUMIN 3.9 03/25/2020 1229   AST 48 (H) 03/25/2020 1229   ALT 55 (H) 03/25/2020 1229   ALKPHOS 53 03/25/2020 1229   BILITOT 0.4 03/25/2020 1229   GFRNONAA >60 03/25/2020 1229      Imaging I have reviewed the images obtained:  CT-scan of the brain-from [place] [facility] revealing ICH in the left parietal lobe and an atypical vascular distribution concerning for a venous infarct with hemorrhagic transformation. MRI brain confirms the bleed and also MRI brain with contrast shows a superior sagittal sinus thrombus along with a cortical vein thrombus on the left which probably caused the venous infarct and that had hemorrhagic transformation. Repeat CT head at [place] [facility] unchanged. Obtaining stat CT head at [facility] due to the recent seizure.   Assessment:  44 year old with no significant past medical history coming in with 3 days worth of headaches and noted to have an atypical looking intracerebral hemorrhage which likely is  hemorrhagic transformation of a venous infarct because the MRI imaging with contrast shows dural venous sinus thrombosis the superior sagittal sinus and a draining cortical vein. Unclear etiology-has no significant past medical history. Started on heparin drip at the outside hospital and transferred to [facility] for higher level of care. While at [facility] ER as she arrived, she had a witnessed generalized tonic-clonic seizure for which she received Ativan and was loaded with Keppra.   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 Based on EEG results we will decide if need second antiepileptic. Closely monitor for further seizure activity clinically. Low threshold for intubation. Possible aspiration pneumonia due to seizure and depressed level of consciousness-we will do chest x-ray.  Hold off antibiotics for now.  Blood pressure goal: Systolic blood pressure less than 140. Given this very tricky situation with an [place] along with dural venous sinus thrombosis, she will need to be on a heparin drip-no bolus low heparin level goal. Low threshold for repeat CT with any neuro change. Check labs in the morning Check toxicology screen Replete electrolytes as necessary N.p.o. until cleared by bedside swallow formal swallow evaluation  Full code  Present on admission: ICH, acute ischemic stroke, dural venous sinus thrombosis, seizures, status epilepticus, possible aspiration pneumonia.  Husband arrived later-spoke with him via telemetry interpreter for [place].  Answered all questions.  -- Amie Portland, MD [facility] Pager: [phone] If 7pm to 7am, please call on call as  listed on AMION.   CRITICAL CARE ATTESTATION Performed by: Amie Portland, MD Total critical care time: 17minutes Critical care time was exclusive of separately billable procedures and treating other patients and/or supervising APPs/Residents/Students Critical care was necessary to treat or prevent imminent or life-threatening deterioration due to dural venous sinus thrombosis, intracerebral hemorrhage, seizures, status epilepticus This patient is critically ill and at significant risk for neurological worsening and/or death and care requires constant monitoring. Critical care was time spent personally by me on the following activities: development of treatment plan with patient and/or surrogate as well as nursing, discussions with consultants, evaluation of patient's response to treatment, examination of patient, obtaining history from patient or surrogate, ordering and performing treatments and interventions, ordering and review of laboratory studies, ordering and review of radiographic studies, pulse oximetry, re-evaluation of patient's condition, participation in multidisciplinary rounds and medical decision making of high complexity in the care of this patient.

## 2020-03-25 NOTE — ED Notes (Signed)
Pt responsive to pain at this time, no seizure activity noted, EEG tech at bedside.

## 2020-03-25 NOTE — ED Provider Notes (Signed)
[facility] EMERGENCY DEPARTMENT Provider Note   CSN: [id number] Arrival date & time: 03/25/20  2138     History Chief Complaint  Patient presents with  . Headache    Erica Rosario is a 44 y.o. female.  The history is provided by the patient and medical records. A language interpreter was used.  Headache  Erica Rosario is a 44 y.o. female who presents to the Emergency Department complaining of headache, dural sinus thrombosis. Level V caveat due to acuity of condition and language barrier. She presents the emergency department as a transfer from [facility] for dural sinus thrombosis with hemorrhagic transformation. She was started on heparin drip. Patient complained of headache to critical care transfer service. On ED arrival patient unable to provide meaningful communication.    No past medical history on file.  Patient Active Problem List   Diagnosis Date Noted  . ICH (intracerebral hemorrhage) ([place]) 03/25/2020    No past surgical history on file.   OB History   No obstetric history on file.     No family history on file.  Social History   Tobacco Use  . Smoking status: Never Smoker  . Smokeless tobacco: Never Used  Substance Use Topics  . Alcohol use: Not Currently  . Drug use: Never    Home Medications Prior to Admission medications   Not on File    Allergies    Patient has no known allergies.  Review of Systems   Review of Systems  Unable to perform ROS: Mental status change  Neurological: Positive for headaches.    Physical Exam Updated Vital Signs BP (!) 99/48   Pulse 70   Resp 19   LMP 02/24/2020   SpO2 100%   Physical Exam Vitals and nursing note reviewed.  Constitutional:      Appearance: She is well-developed.  HENT:     Head: Normocephalic and atraumatic.  Cardiovascular:     Rate and Rhythm: Normal rate and regular rhythm.     Heart sounds: No murmur heard.   Pulmonary:      Effort: Pulmonary effort is normal. No respiratory distress.     Breath sounds: Normal breath sounds.  Abdominal:     Palpations: Abdomen is soft.     Tenderness: There is no abdominal tenderness. There is no guarding or rebound.  Musculoskeletal:        General: No tenderness.  Skin:    General: Skin is warm and dry.  Neurological:     Mental Status: She is alert.     Comments: Confused. Unable to follow basic commands. Moves all extremities but preferentially has the right arm drawn up and the right hand clenched.  Psychiatric:        Behavior: Behavior normal.     ED Results / Procedures / Treatments   Labs (all labs ordered are listed, but only abnormal results are displayed) Labs Reviewed  HIV ANTIBODY (ROUTINE TESTING W REFLEX)  HEMOGLOBIN A1C  RAPID URINE DRUG SCREEN, HOSP PERFORMED    EKG None  Radiology CT Head Wo Contrast  Result Date: 03/25/2020 CLINICAL DATA:  Initial evaluation for acute seizure. EXAM: CT HEAD WITHOUT CONTRAST TECHNIQUE: Contiguous axial images were obtained from the base of the skull through the vertex without intravenous contrast. COMPARISON:  Prior CT from earlier the same day. FINDINGS: Brain: Venous infarction with hemorrhagic conversion involving the left parieto-occipital region again seen, not significantly changed in size and morphology as compared  to prior CT from earlier the same day. Surrounding vasogenic edema and regional mass effect is relatively similar. Associated left-to-right shift measures up to 4 mm, unchanged. Partial effacement of the left lateral ventricle without hydrocephalus or trapping, also similar. Basilar cisterns remain patent. No transtentorial herniation. No other acute intracranial hemorrhage or large vessel territory infarct. No visible extra-axial fluid collection. Vascular: No hyperdense vessel. Skull: Scalp soft tissues and calvarium remain within normal limits. Sinuses/Orbits: Globes and orbital soft tissues  demonstrate no new finding.Paranasal sinuses and mastoid air cells remain clear. Other: None. IMPRESSION: 1. No significant interval change in size and morphology of left parieto-occipital lobe venous infarction with hemorrhagic conversion. Surrounding vasogenic edema and regional mass effect with 4 mm of left-to-right shift, unchanged. No hydrocephalus or trapping. 2. No other new acute intracranial abnormality. Electronically Signed   By: Jeannine Boga M.D.   On: 03/25/2020 22:51   CT HEAD WO CONTRAST  Result Date: 03/25/2020 CLINICAL DATA:  Headaches and neck stiffness.  Follow-up hemorrhage. EXAM: CT HEAD WITHOUT CONTRAST TECHNIQUE: Contiguous axial images were obtained from the base of the skull through the vertex without intravenous contrast. COMPARISON:  Earlier same day FINDINGS: Brain: Venous infarction with hemorrhagic transformation in the left parietal region shows slightly more edema. The majority of the foci of internal hemorrhage are unchanged. There is a single new focus of hemorrhage at the vertex as marked by an arrow measuring 7 mm. The others appear stable. Mild swelling and mass effect no midline shift. No new area of infarction identified. No hydrocephalus. No evidence of subarachnoid extension. Vascular: Somewhat hyperdense superior sagittal sinus consistent with thrombosis shown by MRI. Skull: Negative Sinuses/Orbits: Clear/normal Other: None IMPRESSION: 1. Venous infarction with hemorrhagic transformation in the left parietal region shows slightly more edema. The majority of the foci of internal hemorrhage are unchanged. There is a single new focus of hemorrhage at the vertex as marked by an arrow measuring 7 mm. The others appear stable. 2. Somewhat hyperdense superior sagittal sinus consistent with thrombosis shown by MRI. Electronically Signed   By: Nelson Chimes M.D.   On: 03/25/2020 20:28   CT Head Wo Contrast  Result Date: 03/25/2020 CLINICAL DATA:  Three day history of  headache and visual disturbance. EXAM: CT HEAD WITHOUT CONTRAST TECHNIQUE: Contiguous axial images were obtained from the base of the skull through the vertex without intravenous contrast. COMPARISON:  None. FINDINGS: Brain: There is a hemorrhagic process in the left posterior parietal/upper occipital region with moderate surrounding edema which appears to extend all the way to the cortex on the coronal images. This could be a hemorrhagic infarction, an AVM, a hypertensive process or less likely PRES syndrome. Recommend MRI brain without and with contrast for further evaluation. No significant mass effect or midline shift. No extra-axial fluid collections are identified. The right cerebral hemisphere appears normal. The brainstem and cerebellum are unremarkable. Vascular: No vascular calcifications or hyperdense vessels. Skull: No skull fracture or bone lesions. Sinuses/Orbits: The paranasal sinuses and mastoid air cells are clear. The globes are intact. Other: No scalp lesions or scalp hematoma. IMPRESSION: 1. Hemorrhagic process in the left posterior parietal region with moderate surrounding edema. This could be a hemorrhagic infarction, an AVM, a hypertensive process or less likely PRES syndrome. Recommend MRI brain without and with contrast for further evaluation. 2. No significant mass effect or midline shift. These results were called by telephone at the time of interpretation on 03/25/2020 at 1:16 pm to provider [facility] ,  who verbally acknowledged these results. Electronically Signed   By: Marijo Sanes M.D.   On: 03/25/2020 13:16   MR Brain W and Wo Contrast  Result Date: 03/25/2020 CLINICAL DATA:  Brain mass or lesion. EXAM: MRI HEAD WITHOUT AND WITH CONTRAST TECHNIQUE: Multiplanar, multiecho pulse sequences of the brain and surrounding structures were obtained without and with intravenous contrast. CONTRAST:  31mL GADAVIST GADOBUTROL 1 MMOL/ML IV SOLN COMPARISON:  None. FINDINGS: Brain:  Redemonstrated left occipital hemorrhage. Similar surrounding edema with local mass effect and mild rightward midline shift. No evidence of underlying mass lesion although acute blood products limit evaluation. There is a rounded area of arterial enhancement with in the hemorrhage centrally (see series 18, image 109) and superiorly within the hemorrhage (series 18, image 131) which may represent active/ongoing hemorrhage. On postcontrast imaging there is evidence of dural sinus thrombosis within the superior sagittal sinus posteriorly (for example see series 18, images 138 through 151) with thrombosis of a draining cortical vein in the region of hemorrhage (for example see series 18, images 123 through 136). No hydrocephalus. Major arterial flow voids are maintained at the skull base. Skull and upper cervical spine: Normal marrow signal. Sinuses/Orbits: Negative. Other: No mastoid effusions. IMPRESSION: 1. Thrombosis of the posterior aspect of the superior sagittal sinus with hemorrhagic transformation of a venous infarct in the left occipital lobe, as detailed above. There is thrombosis of a draining cortical vein in the region of hemorrhage, as detailed above. 2. There are two rounded areas of enhancement within the left occipital hemorrhage which may represent active/ongoing hemorrhage. 3. When compared to same day CT, similar surrounding edema/mass effect with slight rightward midline shift. Critical Value/emergent results were called by telephone at the time of interpretation on 03/25/2020 at 2:38 pm to provider [facility] , who verbally acknowledged these results. Electronically Signed   By: Margaretha Sheffield MD   On: 03/25/2020 14:51    Procedures Procedures (including critical care time) CRITICAL CARE Performed by: Quintella Reichert   Total critical care time: 35 minutes  Critical care time was exclusive of separately billable procedures and treating other patients.  Critical care was  necessary to treat or prevent imminent or life-threatening deterioration.  Critical care was time spent personally by me on the following activities: development of treatment plan with patient and/or surrogate as well as nursing, discussions with consultants, evaluation of patient's response to treatment, examination of patient, obtaining history from patient or surrogate, ordering and performing treatments and interventions, ordering and review of laboratory studies, ordering and review of radiographic studies, pulse oximetry and re-evaluation of patient's condition.  Medications Ordered in ED Medications  LORazepam (ATIVAN) 2 MG/ML injection (has no administration in time range)   stroke: mapping our early stages of recovery book (has no administration in time range)  acetaminophen (TYLENOL) tablet 650 mg (has no administration in time range)    Or  acetaminophen (TYLENOL) 160 MG/5ML solution 650 mg (has no administration in time range)    Or  acetaminophen (TYLENOL) suppository 650 mg (has no administration in time range)  senna-docusate (Senokot-S) tablet 1 tablet (1 tablet Oral Not Given 03/25/20 2306)  pantoprazole (PROTONIX) injection 40 mg (has no administration in time range)  labetalol (NORMODYNE) injection 20 mg (0 mg Intravenous Hold 03/25/20 2306)    And  clevidipine (CLEVIPREX) infusion 0.5 mg/mL (0 mg/hr Intravenous Hold 03/25/20 2324)  levETIRAcetam (KEPPRA) IVPB 500 mg/100 mL premix (has no administration in time range)  potassium chloride 10 mEq in  100 mL IVPB (10 mEq Intravenous New Bag/Given 03/25/20 2333)  LORazepam (ATIVAN) injection 2 mg (2 mg Intravenous Given 03/25/20 2210)    ED Course  I have reviewed the triage vital signs and the nursing notes.  Pertinent labs & imaging results that were available during my care of the patient were reviewed by me and considered in my medical decision making (see chart for details).    MDM Rules/Calculators/A&P                          Patient transferred from [facility] with dural sinus thrombosis with hemorrhagic transformation.  Pt with worsening mental status on ED presentation.  On recheck 10 minutes later pt aphasic but following commands, moving all extremities.  A few minutes later patient developed seizure activity with arching of back, right sided gaze.  She was treated with Keppra, ativan and Neurology consulted.  Plan to admit for further treatment.    Final Clinical Impression(s) / ED Diagnoses Final diagnoses:  Seizure ([place])  Dural sinus thrombosis  Left-sided nontraumatic intracerebral hemorrhage, unspecified cerebral location [facility])    Rx / DC Orders ED Discharge Orders    None       Quintella Reichert, MD 03/25/20 2341

## 2020-03-25 NOTE — Progress Notes (Signed)
ANTICOAGULATION CONSULT NOTE  Pharmacy Consult for heparin Indication: cerebral venous thrombosis  No Known Allergies  Patient Measurements: Height: 4' 11.06" (150 cm) Weight: 63.5 kg (140 lb) IBW/kg (Calculated) : 43.33 Heparin Dosing Weight: 57 kg  Vital Signs: Temp: 98.9 F (37.2 C) (10/06 1228) Temp Source: Oral (10/06 1228) BP: 108/60 (10/06 1600) Pulse Rate: 50 (10/06 1600)  Labs: Recent Labs    03/25/20 1229 03/25/20 1627  HGB 9.2*  --   HCT 28.7*  --   PLT 422*  --   APTT  --  24  CREATININE 0.60  --     Estimated Creatinine Clearance: 72.1 mL/min (by C-G formula based on SCr of 0.6 mg/dL).    Assessment: 44 year old female with dural venous sinus thrombosis with hemorrhage conversion of a L occipital lobe infarction. No surgical intervention per Neurosurgery. Pharmacy consulted for heparin drip for management.  Goal of Therapy:  Heparin level 0.3-0.7 units/ml Monitor platelets by anticoagulation protocol: Yes   Plan:  Consult to start heparin at low dose with no bolus. Will start heparin drip at 900 units/hr which is just below recommended starting rate for VTE management based on patient's dosing weight. Check HL 10/7 at 0100. CBC daily while on heparin drip.  Tawnya Crook, PharmD 03/25/2020,5:41 PM

## 2020-03-26 ENCOUNTER — Inpatient Hospital Stay (HOSPITAL_COMMUNITY): Payer: Medicaid Other

## 2020-03-26 DIAGNOSIS — I6389 Other cerebral infarction: Secondary | ICD-10-CM

## 2020-03-26 DIAGNOSIS — R569 Unspecified convulsions: Secondary | ICD-10-CM

## 2020-03-26 DIAGNOSIS — I611 Nontraumatic intracerebral hemorrhage in hemisphere, cortical: Secondary | ICD-10-CM

## 2020-03-26 LAB — URINALYSIS, COMPLETE (UACMP) WITH MICROSCOPIC
Bilirubin Urine: NEGATIVE
Glucose, UA: NEGATIVE mg/dL
Hgb urine dipstick: NEGATIVE
Ketones, ur: 5 mg/dL — AB
Leukocytes,Ua: NEGATIVE
Nitrite: NEGATIVE
Protein, ur: NEGATIVE mg/dL
Specific Gravity, Urine: 1.01 (ref 1.005–1.030)
pH: 6 (ref 5.0–8.0)

## 2020-03-26 LAB — COMPREHENSIVE METABOLIC PANEL
ALT: 44 U/L (ref 0–44)
AST: 32 U/L (ref 15–41)
Albumin: 3.2 g/dL — ABNORMAL LOW (ref 3.5–5.0)
Alkaline Phosphatase: 50 U/L (ref 38–126)
Anion gap: 12 (ref 5–15)
BUN: 6 mg/dL (ref 6–20)
CO2: 16 mmol/L — ABNORMAL LOW (ref 22–32)
Calcium: 8.2 mg/dL — ABNORMAL LOW (ref 8.9–10.3)
Chloride: 109 mmol/L (ref 98–111)
Creatinine, Ser: 0.61 mg/dL (ref 0.44–1.00)
GFR calc non Af Amer: >60 mL/min (ref >60)
Glucose, Bld: 94 mg/dL (ref 70–99)
Potassium: 3.9 mmol/L (ref 3.5–5.1)
Sodium: 137 mmol/L (ref 135–145)
Total Bilirubin: 0.3 mg/dL (ref 0.3–1.2)
Total Protein: 6.4 g/dL — ABNORMAL LOW (ref 6.5–8.1)

## 2020-03-26 LAB — TSH: TSH: 0.652 u[IU]/mL (ref 0.350–4.500)

## 2020-03-26 LAB — ECHOCARDIOGRAM COMPLETE
Area-P 1/2: 2.34 cm2
Height: 59 in
S' Lateral: 3.1 cm
Weight: 2239.87 oz

## 2020-03-26 LAB — LIPID PANEL
Cholesterol: 210 mg/dL — ABNORMAL HIGH (ref 0–200)
HDL: 53 mg/dL (ref >40)
Total CHOL/HDL Ratio: 4 RATIO
Triglycerides: 132 mg/dL (ref <150)
VLDL: 26 mg/dL (ref 0–40)

## 2020-03-26 LAB — RESPIRATORY PANEL BY RT PCR (FLU A&B, COVID)
Influenza A by PCR: NEGATIVE
Influenza B by PCR: NEGATIVE
SARS Coronavirus 2 by RT PCR: NEGATIVE

## 2020-03-26 LAB — HEPARIN LEVEL (UNFRACTIONATED)
Heparin Unfractionated: 0.39 IU/mL (ref 0.30–0.70)
Heparin Unfractionated: 0.48 IU/mL (ref 0.30–0.70)

## 2020-03-26 LAB — CBC
HCT: 28.4 % — ABNORMAL LOW (ref 36.0–46.0)
Hemoglobin: 8.6 g/dL — ABNORMAL LOW (ref 12.0–15.0)
MCH: 22.5 pg — ABNORMAL LOW (ref 26.0–34.0)
MCHC: 30.3 g/dL (ref 30.0–36.0)
MCV: 74.2 fL — ABNORMAL LOW (ref 80.0–100.0)
Platelets: 359 10*3/uL (ref 150–400)
RBC: 3.83 MIL/uL — ABNORMAL LOW (ref 3.87–5.11)
RDW: 18.6 % — ABNORMAL HIGH (ref 11.5–15.5)
WBC: 13.9 10*3/uL — ABNORMAL HIGH (ref 4.0–10.5)
nRBC: 0 % (ref 0.0–0.2)

## 2020-03-26 LAB — HIV ANTIBODY (ROUTINE TESTING W REFLEX): HIV Screen 4th Generation wRfx: NONREACTIVE

## 2020-03-26 LAB — RPR: RPR Ser Ql: NONREACTIVE

## 2020-03-26 LAB — HOMOCYSTEINE: Homocysteine: 4.9 umol/L (ref 0.0–14.5)

## 2020-03-26 LAB — MRSA PCR SCREENING: MRSA by PCR: NEGATIVE

## 2020-03-26 LAB — HEMOGLOBIN A1C
Hgb A1c MFr Bld: 5.6 % (ref 4.8–5.6)
Mean Plasma Glucose: 114.02 mg/dL

## 2020-03-26 LAB — RAPID URINE DRUG SCREEN, HOSP PERFORMED
Amphetamines: NOT DETECTED
Barbiturates: NOT DETECTED
Benzodiazepines: POSITIVE — AB
Cocaine: NOT DETECTED
Opiates: NOT DETECTED
Tetrahydrocannabinol: NOT DETECTED

## 2020-03-26 LAB — VITAMIN B12: Vitamin B-12: 266 pg/mL (ref 180–914)

## 2020-03-26 LAB — LDL CHOLESTEROL, DIRECT: Direct LDL: 151.9 mg/dL — ABNORMAL HIGH (ref 0–99)

## 2020-03-26 MED ORDER — SODIUM CHLORIDE 0.9 % IV SOLN: INTRAVENOUS | Status: DC

## 2020-03-26 MED ORDER — TRAMADOL HCL 50 MG PO TABS
50.0000 mg | ORAL_TABLET | Freq: Four times a day (QID) | ORAL | Status: DC | PRN
  Administered 2020-03-26 – 2020-03-27 (×2): 50 mg via ORAL
  Filled 2020-03-26 (×2): qty 1

## 2020-03-26 MED ORDER — HEPARIN (PORCINE) 25000 UT/250ML-% IV SOLN
650.0000 [IU]/h | INTRAVENOUS | Status: DC
  Administered 2020-03-26 – 2020-03-28 (×2): 900 [IU]/h via INTRAVENOUS
  Administered 2020-03-29 – 2020-03-31 (×2): 650 [IU]/h via INTRAVENOUS
  Filled 2020-03-26 (×4): qty 250

## 2020-03-26 MED ORDER — CHLORHEXIDINE GLUCONATE CLOTH 2 % EX PADS
6.0000 | MEDICATED_PAD | Freq: Every day | CUTANEOUS | Status: DC
  Administered 2020-03-26 – 2020-03-28 (×2): 6 via TOPICAL

## 2020-03-26 NOTE — Progress Notes (Signed)
ANTICOAGULATION CONSULT NOTE - Follow Up Consult  Pharmacy Consult for Heparin Indication: Dural SVT with hemorrhagic conversion  No Known Allergies  Patient Measurements: Height: 4\' 11"  (149.9 cm) Weight: 63.5 kg (139 lb 15.9 oz) IBW/kg (Calculated) : 43.2 Heparin Dosing Weight: 56.8  Vital Signs: Temp: 99.6 F (37.6 C) (10/07 0105) Temp Source: Axillary (10/07 0105) BP: 93/51 (10/07 0600) Pulse Rate: 60 (10/07 0600)  Labs: Recent Labs    03/25/20 1229 03/25/20 1627 03/25/20 1749  HGB 9.2*  --   --   HCT 28.7*  --   --   PLT 422*  --   --   APTT  --  24  --   LABPROT  --   --  13.5  INR  --   --  1.1  CREATININE 0.60  --   --     Estimated Creatinine Clearance: 71.9 mL/min (by C-G formula based on SCr of 0.6 mg/dL).   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 Pharmacy was consulted to start Heparin for anticoagulation - low goal and no bolus.  Heparin was started at [facility] around 1800 on 10/6 with the initial level due at 0100 on 10/7 AM. The patient transferred to [facility] on 10/6 and heparin was continued overnight without any levels drawn.   A heparin level morning was therapeutic (HL 0.39, goal of 0.3-0.5). Hgb/Hct slight drop, plts wnl. No bleeding or issues noted per RN.   Goal of Therapy:  Heparin level 0.3-0.5 units/ml Monitor platelets by anticoagulation protocol: Yes   Plan:  - Continue Heparin at 900 units/hr - Will continue to monitor for any signs/symptoms of bleeding and will follow up with heparin level in 6 hours to confirm therapeutic  Thank you for allowing pharmacy to be a part of this patients care.  Alycia Rossetti, PharmD, BCPS Clinical Pharmacist Clinical phone for 03/26/2020:  [phone] 03/26/2020 8:44 AM   **Pharmacist phone directory can now be found on [email or web address] ([email or web address]).  Listed under [place].

## 2020-03-26 NOTE — Progress Notes (Signed)
ANTICOAGULATION CONSULT NOTE - Follow Up Consult  Pharmacy Consult for Heparin Indication: Dural SVT with hemorrhagic conversion  No Known Allergies  Patient Measurements: Height: 4\' 11"  (149.9 cm) Weight: 63.5 kg (139 lb 15.9 oz) IBW/kg (Calculated) : 43.2 Heparin Dosing Weight: 56.8  Vital Signs: Temp: 99.6 F (37.6 C) (10/07 1700) Temp Source: Oral (10/07 1700) BP: 118/67 (10/07 1700) Pulse Rate: 72 (10/07 1700)  Labs: Recent Labs    03/25/20 1229 03/25/20 1627 03/25/20 1749 03/26/20 1010 03/26/20 1549  HGB 9.2*  --   --  8.6*  --   HCT 28.7*  --   --  28.4*  --   PLT 422*  --   --  359  --   APTT  --  24  --   --   --   LABPROT  --   --  13.5  --   --   INR  --   --  1.1  --   --   HEPARINUNFRC  --   --   --  0.39 0.48  CREATININE 0.60  --   --  0.61  --     Estimated Creatinine Clearance: 71.9 mL/min (by C-G formula based on SCr of 0.61 mg/dL).   Medications:  Infusions:  . sodium chloride 50 mL/hr at 03/26/20 1735  . clevidipine Stopped (03/25/20 2324)  . heparin 900 Units/hr (03/26/20 1400)  . 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 Pharmacy was consulted to start Heparin for anticoagulation - low goal and no bolus. It is noted that Heparin was started at [facility] around 1800 on 10/6 with the initial level due at 0100 on 10/7 AM. The patient transferred to [facility] on 10/6 and heparin was continued overnight without any levels drawn.   Heparin level remains therapeutic at 0.48 this PM (therapeutic x2 results) but is trending up to higher end of goal range 0.3 to 0.5. H/H is slightly down from admission. Platelets are within normal limits. No bleeding or issues noted per RN.   Goal of Therapy:  Heparin level 0.3-0.5 units/ml Monitor platelets by anticoagulation protocol: Yes   Plan:  - Reduce to  850 units/hr to prevent accumulation above goal.  - Will continue to monitor for any signs/symptoms of bleeding and will follow up with heparin level in 6 hours to confirm therapeutic  Thank you for allowing pharmacy to be a part of this patient's care.  Sloan Leiter, PharmD, BCPS, BCCCP Clinical Pharmacist Please refer to [facility] for [place] numbers 03/26/2020 6:42 PM   **Pharmacist phone directory can now be found on [email or web address] ([email or web address]).  Listed under [place].

## 2020-03-26 NOTE — Progress Notes (Signed)
PT Cancellation Note  Patient Details Name: Erica Rosario MRN: [id number] DOB: 1976-04-05   Cancelled Treatment:    Reason Eval/Treat Not Completed: Active bedrest order; will await updated activity orders.    Reginia Naas 03/26/2020, 8:36 AM  Magda Kiel, PT Acute Rehabilitation Services OJJKK:[phone] [phone] 03/26/2020

## 2020-03-26 NOTE — Progress Notes (Signed)
LTM reconnected- pt was moved. Maintenance also completed; no skin breakdown was seen under Fp1, Fp2 or A2.

## 2020-03-26 NOTE — Progress Notes (Signed)
STROKE TEAM PROGRESS NOTE   INTERVAL HISTORY Her RN, husband and niece are at the bedside. Spanish interpreter also present on the [place]. Pt still complain of slight HA and neck stiffness but no N/V. Still complain of vision changes. On exam, she has right HH. She admitted that she is on OCP with OTC 28 (ortho tri-cyclen), but not smoking.   Vitals:   03/26/20 0830 03/26/20 0845 03/26/20 0900 03/26/20 1000  BP: 107/71  103/74 (!) 98/59  Pulse: 76 76 72 74  Resp: 14 (!) 21 14 18   Temp: 99.6 F (37.6 C)     TempSrc: Oral     SpO2: 100% 100% 100% 99%  Weight:      Height:       CBC:  Recent Labs  Lab 03/25/20 1229  WBC 9.9  NEUTROABS 7.2  HGB 9.2*  HCT 28.7*  MCV 70.2*  PLT 947*   Basic Metabolic Panel:  Recent Labs  Lab 03/25/20 1229  NA 135  K 3.2*  CL 106  CO2 20*  GLUCOSE 99  BUN 10  CREATININE 0.60  CALCIUM 8.8*   Lipid Panel:  Recent Labs  Lab 03/26/20 1010  CHOL 210*  TRIG 132  HDL 53  CHOLHDL 4.0  VLDL 26  LDLCALC NOT CALCULATED   HgbA1c:  Recent Labs  Lab 03/26/20 0353  HGBA1C 5.6   Urine Drug Screen: No results for input(s): LABOPIA, COCAINSCRNUR, LABBENZ, AMPHETMU, THCU, LABBARB in the last 168 hours.  Alcohol Level No results for input(s): ETH in the last 168 hours.  IMAGING past 24 hours CT Head Wo Contrast  Result Date: 03/25/2020 CLINICAL DATA:  Initial evaluation for acute seizure. EXAM: CT HEAD WITHOUT CONTRAST TECHNIQUE: Contiguous axial images were obtained from the base of the skull through the vertex without intravenous contrast. COMPARISON:  Prior CT from earlier the same day. FINDINGS: Brain: Venous infarction with hemorrhagic conversion involving the left parieto-occipital region again seen, not significantly changed in size and morphology as compared to prior CT from earlier the same day. Surrounding vasogenic edema and regional mass effect is relatively similar. Associated left-to-right shift measures up to 4 mm, unchanged. Partial  effacement of the left lateral ventricle without hydrocephalus or trapping, also similar. Basilar cisterns remain patent. No transtentorial herniation. No other acute intracranial hemorrhage or large vessel territory infarct. No visible extra-axial fluid collection. Vascular: No hyperdense vessel. Skull: Scalp soft tissues and calvarium remain within normal limits. Sinuses/Orbits: Globes and orbital soft tissues demonstrate no new finding.Paranasal sinuses and mastoid air cells remain clear. Other: None. IMPRESSION: 1. No significant interval change in size and morphology of left parieto-occipital lobe venous infarction with hemorrhagic conversion. Surrounding vasogenic edema and regional mass effect with 4 mm of left-to-right shift, unchanged. No hydrocephalus or trapping. 2. No other new acute intracranial abnormality. Electronically Signed   By: Jeannine Boga M.D.   On: 03/25/2020 22:51   CT HEAD WO CONTRAST  Result Date: 03/25/2020 CLINICAL DATA:  Headaches and neck stiffness.  Follow-up hemorrhage. EXAM: CT HEAD WITHOUT CONTRAST TECHNIQUE: Contiguous axial images were obtained from the base of the skull through the vertex without intravenous contrast. COMPARISON:  Earlier same day FINDINGS: Brain: Venous infarction with hemorrhagic transformation in the left parietal region shows slightly more edema. The majority of the foci of internal hemorrhage are unchanged. There is a single new focus of hemorrhage at the vertex as marked by an arrow measuring 7 mm. The others appear stable. Mild swelling and mass effect  no midline shift. No new area of infarction identified. No hydrocephalus. No evidence of subarachnoid extension. Vascular: Somewhat hyperdense superior sagittal sinus consistent with thrombosis shown by MRI. Skull: Negative Sinuses/Orbits: Clear/normal Other: None IMPRESSION: 1. Venous infarction with hemorrhagic transformation in the left parietal region shows slightly more edema. The majority  of the foci of internal hemorrhage are unchanged. There is a single new focus of hemorrhage at the vertex as marked by an arrow measuring 7 mm. The others appear stable. 2. Somewhat hyperdense superior sagittal sinus consistent with thrombosis shown by MRI. Electronically Signed   By: Nelson Chimes M.D.   On: 03/25/2020 20:28   CT Head Wo Contrast  Result Date: 03/25/2020 CLINICAL DATA:  Three day history of headache and visual disturbance. EXAM: CT HEAD WITHOUT CONTRAST TECHNIQUE: Contiguous axial images were obtained from the base of the skull through the vertex without intravenous contrast. COMPARISON:  None. FINDINGS: Brain: There is a hemorrhagic process in the left posterior parietal/upper occipital region with moderate surrounding edema which appears to extend all the way to the cortex on the coronal images. This could be a hemorrhagic infarction, an AVM, a hypertensive process or less likely PRES syndrome. Recommend MRI brain without and with contrast for further evaluation. No significant mass effect or midline shift. No extra-axial fluid collections are identified. The right cerebral hemisphere appears normal. The brainstem and cerebellum are unremarkable. Vascular: No vascular calcifications or hyperdense vessels. Skull: No skull fracture or bone lesions. Sinuses/Orbits: The paranasal sinuses and mastoid air cells are clear. The globes are intact. Other: No scalp lesions or scalp hematoma. IMPRESSION: 1. Hemorrhagic process in the left posterior parietal region with moderate surrounding edema. This could be a hemorrhagic infarction, an AVM, a hypertensive process or less likely PRES syndrome. Recommend MRI brain without and with contrast for further evaluation. 2. No significant mass effect or midline shift. These results were called by telephone at the time of interpretation on 03/25/2020 at 1:16 pm to provider [facility] , who verbally acknowledged these results. Electronically Signed   By: Marijo Sanes M.D.   On: 03/25/2020 13:16   MR Brain W and Wo Contrast  Result Date: 03/25/2020 CLINICAL DATA:  Brain mass or lesion. EXAM: MRI HEAD WITHOUT AND WITH CONTRAST TECHNIQUE: Multiplanar, multiecho pulse sequences of the brain and surrounding structures were obtained without and with intravenous contrast. CONTRAST:  34mL GADAVIST GADOBUTROL 1 MMOL/ML IV SOLN COMPARISON:  None. FINDINGS: Brain: Redemonstrated left occipital hemorrhage. Similar surrounding edema with local mass effect and mild rightward midline shift. No evidence of underlying mass lesion although acute blood products limit evaluation. There is a rounded area of arterial enhancement with in the hemorrhage centrally (see series 18, image 109) and superiorly within the hemorrhage (series 18, image 131) which may represent active/ongoing hemorrhage. On postcontrast imaging there is evidence of dural sinus thrombosis within the superior sagittal sinus posteriorly (for example see series 18, images 138 through 151) with thrombosis of a draining cortical vein in the region of hemorrhage (for example see series 18, images 123 through 136). No hydrocephalus. Major arterial flow voids are maintained at the skull base. Skull and upper cervical spine: Normal marrow signal. Sinuses/Orbits: Negative. Other: No mastoid effusions. IMPRESSION: 1. Thrombosis of the posterior aspect of the superior sagittal sinus with hemorrhagic transformation of a venous infarct in the left occipital lobe, as detailed above. There is thrombosis of a draining cortical vein in the region of hemorrhage, as detailed above. 2. There are  two rounded areas of enhancement within the left occipital hemorrhage which may represent active/ongoing hemorrhage. 3. When compared to same day CT, similar surrounding edema/mass effect with slight rightward midline shift. Critical Value/emergent results were called by telephone at the time of interpretation on 03/25/2020 at 2:38 pm to provider  [facility] , who verbally acknowledged these results. Electronically Signed   By: Margaretha Sheffield MD   On: 03/25/2020 14:51   Chest Port 1 View  Result Date: 03/26/2020 CLINICAL DATA:  Aspiration pneumonia EXAM: PORTABLE CHEST 1 VIEW COMPARISON:  None. FINDINGS: The heart size and mediastinal contours are within normal limits. Hazy airspace opacity seen at the left lung base. There is streaky airspace opacity seen within the right mid lung. The visualized skeletal structures are unremarkable. IMPRESSION: Hazy airspace opacity at the left lung base, likely atelectasis and/or infectious etiology. Electronically Signed   By: Prudencio Pair M.D.   On: 03/26/2020 00:43    PHYSICAL EXAM  Temp:  [98 F (36.7 C)-99.6 F (37.6 C)] 99.6 F (37.6 C) (10/07 0830) Pulse Rate:  [50-88] 74 (10/07 1000) Resp:  [13-27] 18 (10/07 1000) BP: (93-128)/(48-109) 98/59 (10/07 1000) SpO2:  [97 %-100 %] 99 % (10/07 1000) Weight:  [63.5 kg] 63.5 kg (10/07 0800)  General - Well nourished, well developed, in no apparent distress.  Ophthalmologic - fundi not visualized due to noncooperation.  Cardiovascular - Regular rhythm and rate.  Mental Status -  Level of arousal and orientation to month, place, and person were intact, but not to year. Language including expression, naming, repetition, comprehension was assessed and found intact. Fund of Knowledge was assessed and was intact.  Cranial Nerves II - XII - II - Visual field right hemianopia. III, IV, VI - Extraocular movements intact. V - Facial sensation intact bilaterally. VII - Facial movement intact bilaterally. VIII - Hearing & vestibular intact bilaterally. X - Palate elevates symmetrically. XI - Chin turning & shoulder shrug intact bilaterally. XII - Tongue protrusion intact.  Motor Strength - The patient's strength was normal in all extremities and pronator drift was absent.  Bulk was normal and fasciculations were absent.   Motor Tone -  Muscle tone was assessed at the neck and appendages and was normal.  Reflexes - The patient's reflexes were symmetrical in all extremities and she had no pathological reflexes.  Sensory - Light touch, temperature/pinprick were assessed and were symmetrical.    Coordination - The patient had normal movements in the hands with no ataxia or dysmetria.  Tremor was absent.  Gait and Station - deferred.   ASSESSMENT/PLAN Ms. Adler I Comes is a 44 y.o. female with no PMH presenting ro [facility] with HA and neck stiffness.   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 Also thrombosis draining cortical vein. 2 areas enhancement in L occipital hemorrhage possible ongoing hemorrhage. Similar edema w/ slight R shift.  CT head 10/6 2028 venous infarct w/ hemorrhagic transformation L parietal slight increase edema, most hemorrhage foci unchanged. Single new hemorrhage at vertex. Hyperdense sagittal sinus (thrombosis).  CT head 10/6 2251 stable  Pan CT to rule out malignancy  2D Echo pending  LDL UTC, TC 210, TG 132, direct LDL pending  HgbA1c 5.6  UDS pending  Hypercoagulable labs pending  VTE prophylaxis - SCDs   No antithrombotic prior to admission, now on heparin IV.   Therapy recommendations:  pending   Disposition:  pending   Seizure   GTC in ER   S/p ativan and keppra load  Continue keppra 500 bid  On LTM EEG - report pending  Seizure precautions  OCP use  On OTC 28 for contraception   Not smoker  Recommend to avoid OCP use, especially estrogen use  Other Stroke Risk Factors  Hx ETOH use  Overweight, Body mass index is 28.27 kg/m., recommend weight loss, diet and exercise  as appropriate   Other Active Problems  Anemia, Hgb 9.2->8.6  Leukocytosis WBC 9.9->13.9 - UA pending  Hypokalemia, K 3.2->3.9    Hospital day # 1  This patient is critically ill due to 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 This patient's care requires constant monitoring of vital signs, hemodynamics, respiratory and cardiac monitoring, review of multiple databases, neurological assessment, discussion with family, other specialists and medical decision making of high complexity. I spent 45 minutes of neurocritical care time in the care of this patient. I had long discussion with husband and niece at bedside, updated pt current condition, treatment plan and potential prognosis, and answered all the questions.  They expressed understanding and appreciation.    Rosalin Hawking, MD PhD Stroke Neurology 03/26/2020 4:47 PM   To contact Stroke Continuity provider, please refer to [email or web address] After hours, contact General Neurology

## 2020-03-26 NOTE — Progress Notes (Signed)
OT Cancellation Note  Patient Details Name: Erica Rosario MRN: [id number] DOB: [birth year]   Cancelled Treatment:    Reason Eval/Treat Not Completed: Active bedrest order  [place], OT/L   Acute OT Clinical Specialist Acute Rehabilitation Services Pager [phone] Office [phone]  03/26/2020, 6:42 AM

## 2020-03-26 NOTE — Evaluation (Signed)
Speech Language Pathology Evaluation Patient Details Name: Erica Rosario MRN: [id number] DOB: [birth year] Today's Date: 03/26/2020 Time: 3664-4034 SLP Time Calculation (min) (ACUTE ONLY): 26 min  Problem List:  Patient Active Problem List   Diagnosis Date Noted  . ICH (intracerebral hemorrhage) ([place]) 03/25/2020   Past Medical History: No past medical history on file. Past Surgical History: No past surgical history on file. HPI:  Erica Rosario is a 44 y.o. female no PMH, presented to [facility] ED with complaints of ongoing headache and neck stiffness. Additional complaints of blurred and double vision mostly looking to the right. CT-scan of the brain from [facility] revealing [place] in the left parietal lobe and an atypical vascular distribution concerning for a venous infarct with hemorrhagic transformation. 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 As she was being evaluated by the ED providers after transfer to [facility], she had a witnessed tonic-clonic seizure. Possible aspiration pneumonia due to seizure and depressed level of consciousness- CXR revealed hazy airspace opacity at the left lung base, likely atelectasis and/or infectious etiology.   Assessment / Plan / Recommendation Clinical Impression  Pt demonstrates primary cognitive and visual impairments that impact her ability to complete basic functional and verbal problem solving tasks. Scored 10/30 on SLUMS administered in [place]. Pts working memory is significantly impaired. Her pain level at the time of assessment was only a two, but pt was able to store or retrieve information for delayed recall or a simple verbal reasoning task. Language comprehension is in tact, but pt need multiple frequent repetition to process multistep tasks. Her right field of vision is significanlty impaired, and she  particularly struggles with reading and writing tasks with some perseveration, poor formulation of letters and numbers. She will need f/u SLP intervenitons to target cognition and visual language. Will f/u acutely, recommend CIR at next level of care.     SLP Assessment       Follow Up Recommendations  Inpatient Rehab    Frequency and Duration min 2x/week  2 weeks      SLP Evaluation Cognition  Overall Cognitive Status: Impaired/Different from baseline Arousal/Alertness: Awake/alert Orientation Level: Oriented to person;Oriented to time;Oriented to situation Attention: Focused;Sustained;Selective Focused Attention: Appears intact Sustained Attention: Appears intact Selective Attention: Impaired Memory: Impaired Memory Impairment: Storage deficit;Retrieval deficit Awareness: Appears intact Problem Solving: Impaired Problem Solving Impairment: Verbal basic;Functional basic Safety/Judgment: Appears intact       Comprehension  Auditory Comprehension Overall Auditory Comprehension: Appears within functional limits for tasks assessed Visual Recognition/Discrimination Discrimination: Not tested Reading Comprehension Reading Status: Impaired    Expression Verbal Expression Overall Verbal Expression: Appears within functional limits for tasks assessed   Oral / Motor  Oral Motor/Sensory Function Overall Oral Motor/Sensory Function: Within functional limits Motor Speech Overall Motor Speech: Appears within functional limits for tasks assessed   GO                   Herbie [place], MA [place] Pager [phone] Office [phone]  Lynann Beaver 03/26/2020, 11:52 AM

## 2020-03-26 NOTE — Progress Notes (Signed)
Stat EEG with no status epilepticus. Formal read in AM -- Amie Portland, MD [facility] Pager: [phone] If 7pm to 7am, please call on call as listed on AMION.

## 2020-03-26 NOTE — Procedures (Signed)
Patient Name: Erica Rosario  MRN: [id number]  Epilepsy Attending: Lora Havens  Referring Physician/Provider: Dr Amie Portland Duration: 03/25/2020 2318 to 0112, 03/26/2020 0836 to 03/27/2020 0636  Patient history: 44yo F with venous ischemic infarct with hemorrhagic transformation presented with seizure. EEG to evaluate for seizure.   Level of alertness: awake, asleep  AEDs during EEG study: LEV  Technical aspects: This EEG study was done with scalp electrodes positioned according to the 10-20 International system of electrode placement. Electrical activity was acquired at a sampling rate of 500Hz  and reviewed with a high frequency filter of 70Hz  and a low frequency filter of 1Hz . EEG data were recorded continuously and digitally stored.   Description: EEG showed posterior dominant rhythm of 9 Hz activity of moderate voltage (25-35 uV) seen predominantly in posterior head regions, symmetric and reactive to eye opening and eye closing. Sleep was characterized by vertex waves, sleep spindles (12-14hz ), maximal frontocentral region. EEG also showed continuous 3-6Hz  theta- delta slowing in left frontotemporal region. Spikes were also seen in bilateral parieto-occipital region. Hyperventilation and photic stimulation were not performed.     ABNORMALITY - Continuous slow, left frontotemporal - Spikes, bilateral parieto-occipital region  IMPRESSION: This study showed evidence of epileptogenicity arising from bilateral parieto-occipital region as well as cortical dysfunction in left frontotemporal region likely secondary to underlying hemorrhage. No seizures were seen during this study.  Latosha Gaylord Barbra Sarks

## 2020-03-26 NOTE — Progress Notes (Signed)
Still awaiting Hep level results. [place] discovered first phlebotomist did not get a sample. (I saw her in room applying tournaquet and assumed she had drawn sample). New phlebotomist to bedside, Erica Rosario drew lab and is tubing it down at this time (1014).

## 2020-03-26 NOTE — Progress Notes (Signed)
  Echocardiogram 2D Echocardiogram has been performed.  Jennette Dubin 03/26/2020, 2:23 PM

## 2020-03-26 NOTE — Progress Notes (Signed)
Pt with infusing heparin gtt and no heparin level. Stat heparin level ordered and MD notified.

## 2020-03-27 ENCOUNTER — Inpatient Hospital Stay (HOSPITAL_COMMUNITY): Payer: Medicaid Other

## 2020-03-27 DIAGNOSIS — E782 Mixed hyperlipidemia: Secondary | ICD-10-CM

## 2020-03-27 DIAGNOSIS — I8290 Acute embolism and thrombosis of unspecified vein: Secondary | ICD-10-CM

## 2020-03-27 LAB — CARDIOLIPIN ANTIBODIES, IGG, IGM, IGA
Anticardiolipin IgA: <9 APL U/mL (ref 0–11)
Anticardiolipin IgG: <9 GPL U/mL (ref 0–14)
Anticardiolipin IgM: 19 MPL U/mL — ABNORMAL HIGH (ref 0–12)

## 2020-03-27 LAB — BASIC METABOLIC PANEL
Anion gap: 7 (ref 5–15)
BUN: 6 mg/dL (ref 6–20)
CO2: 19 mmol/L — ABNORMAL LOW (ref 22–32)
Calcium: 7.8 mg/dL — ABNORMAL LOW (ref 8.9–10.3)
Chloride: 111 mmol/L (ref 98–111)
Creatinine, Ser: 0.68 mg/dL (ref 0.44–1.00)
GFR calc non Af Amer: >60 mL/min (ref >60)
Glucose, Bld: 96 mg/dL (ref 70–99)
Potassium: 3.7 mmol/L (ref 3.5–5.1)
Sodium: 137 mmol/L (ref 135–145)

## 2020-03-27 LAB — PROTEIN C ACTIVITY: Protein C Activity: 116 % (ref 73–180)

## 2020-03-27 LAB — PROTEIN S, TOTAL: Protein S Ag, Total: 55 % — ABNORMAL LOW (ref 60–150)

## 2020-03-27 LAB — PROTEIN C, TOTAL: Protein C, Total: 106 % (ref 60–150)

## 2020-03-27 LAB — CBC
HCT: 26 % — ABNORMAL LOW (ref 36.0–46.0)
Hemoglobin: 7.8 g/dL — ABNORMAL LOW (ref 12.0–15.0)
MCH: 22.1 pg — ABNORMAL LOW (ref 26.0–34.0)
MCHC: 30 g/dL (ref 30.0–36.0)
MCV: 73.7 fL — ABNORMAL LOW (ref 80.0–100.0)
Platelets: 325 10*3/uL (ref 150–400)
RBC: 3.53 MIL/uL — ABNORMAL LOW (ref 3.87–5.11)
RDW: 18.8 % — ABNORMAL HIGH (ref 11.5–15.5)
WBC: 8.9 10*3/uL (ref 4.0–10.5)
nRBC: 0 % (ref 0.0–0.2)

## 2020-03-27 LAB — LUPUS ANTICOAGULANT PANEL
DRVVT: 31.8 s (ref 0.0–47.0)
PTT Lupus Anticoagulant: 24.3 s (ref 0.0–51.9)

## 2020-03-27 LAB — BETA-2-GLYCOPROTEIN I ABS, IGG/M/A
Beta-2 Glyco I IgG: <9 GPI IgG units (ref 0–20)
Beta-2-Glycoprotein I IgA: 17 GPI IgA units (ref 0–25)
Beta-2-Glycoprotein I IgM: <9 GPI IgM units (ref 0–32)

## 2020-03-27 LAB — HEPARIN LEVEL (UNFRACTIONATED)
Heparin Unfractionated: 0.29 IU/mL — ABNORMAL LOW (ref 0.30–0.70)
Heparin Unfractionated: 0.29 IU/mL — ABNORMAL LOW (ref 0.30–0.70)
Heparin Unfractionated: 0.38 IU/mL (ref 0.30–0.70)

## 2020-03-27 LAB — PROTEIN S ACTIVITY: Protein S Activity: 42 % — ABNORMAL LOW (ref 63–140)

## 2020-03-27 LAB — HEMOGLOBIN AND HEMATOCRIT, BLOOD
HCT: 26.6 % — ABNORMAL LOW (ref 36.0–46.0)
Hemoglobin: 7.9 g/dL — ABNORMAL LOW (ref 12.0–15.0)

## 2020-03-27 MED ORDER — BUTALBITAL-APAP-CAFFEINE 50-325-40 MG PO TABS
1.0000 | ORAL_TABLET | Freq: Three times a day (TID) | ORAL | Status: DC | PRN
  Administered 2020-03-27 – 2020-03-31 (×7): 1 via ORAL
  Filled 2020-03-27 (×7): qty 1

## 2020-03-27 MED ORDER — IOHEXOL 300 MG/ML  SOLN
100.0000 mL | Freq: Once | INTRAMUSCULAR | Status: AC | PRN
  Administered 2020-03-27: 100 mL via INTRAVENOUS

## 2020-03-27 MED ORDER — ATORVASTATIN CALCIUM 40 MG PO TABS
40.0000 mg | ORAL_TABLET | Freq: Every day | ORAL | Status: DC
  Administered 2020-03-27 – 2020-03-31 (×5): 40 mg via ORAL
  Filled 2020-03-27 (×5): qty 1

## 2020-03-27 MED ORDER — LEVETIRACETAM 500 MG PO TABS
500.0000 mg | ORAL_TABLET | Freq: Two times a day (BID) | ORAL | Status: DC
  Administered 2020-03-27 – 2020-03-31 (×8): 500 mg via ORAL
  Filled 2020-03-27 (×8): qty 1

## 2020-03-27 MED ORDER — PANTOPRAZOLE SODIUM 40 MG PO TBEC
40.0000 mg | DELAYED_RELEASE_TABLET | Freq: Every day | ORAL | Status: DC
  Administered 2020-03-27 – 2020-03-31 (×5): 40 mg via ORAL
  Filled 2020-03-27 (×5): qty 1

## 2020-03-27 MED ORDER — LABETALOL HCL 5 MG/ML IV SOLN: 10.0000 mg | INTRAVENOUS | Status: DC | PRN

## 2020-03-27 NOTE — Progress Notes (Signed)
ANTICOAGULATION CONSULT NOTE - Follow Up Consult  Pharmacy Consult for Heparin Indication: Dural SVT with hemorrhagic conversion  No Known Allergies  Patient Measurements: Height: 4\' 11"  (149.9 cm) Weight: 63.5 kg (139 lb 15.9 oz) IBW/kg (Calculated) : 43.2 Heparin Dosing Weight: 56.8  Vital Signs: Temp: 99 F (37.2 C) (10/08 0400) Temp Source: Oral (10/08 0400) BP: 105/64 (10/08 0900) Pulse Rate: 68 (10/08 0900)  Labs: Recent Labs    03/25/20 1229 03/25/20 1229 03/25/20 1627 03/25/20 1749 03/26/20 1010 03/26/20 1010 03/26/20 1549 03/27/20 0109 03/27/20 0704  HGB 9.2*   < >  --   --  8.6*   < >  --  7.8* 7.9*  HCT 28.7*   < >  --   --  28.4*  --   --  26.0* 26.6*  PLT 422*  --   --   --  359  --   --  325  --   APTT  --   --  24  --   --   --   --   --   --   LABPROT  --   --   --  13.5  --   --   --   --   --   INR  --   --   --  1.1  --   --   --   --   --   HEPARINUNFRC  --   --   --   --  0.39   < > 0.48 0.29* 0.29*  CREATININE 0.60  --   --   --  0.61  --   --  0.68  --    < > = values in this interval not displayed.    Estimated Creatinine Clearance: 71.9 mL/min (by C-G formula based on SCr of 0.68 mg/dL).  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  Heparin just below goal range.   Goal of Therapy:  Heparin level 0.3-0.5 units/ml Monitor platelets by anticoagulation protocol: Yes   Plan:  Will increase heparin to 900 units/hr Recheck Heparin level in 6 hours Monitor for signs and symptoms of bleeding, qam HL and CBC  Alanda Slim, PharmD, [facility] Clinical Pharmacist Please see AMION for all Pharmacists' Contact Phone Numbers 03/27/2020, 9:31 AM

## 2020-03-27 NOTE — Progress Notes (Signed)
ANTICOAGULATION CONSULT NOTE - Follow Up Consult  Pharmacy Consult for Heparin Indication: Dural SVT with hemorrhagic conversion  No Known Allergies  Patient Measurements: Height: 4\' 11"  (149.9 cm) Weight: 63.5 kg (139 lb 15.9 oz) IBW/kg (Calculated) : 43.2 Heparin Dosing Weight: 56.8  Vital Signs: Temp: 99.4 F (37.4 C) (10/08 0000) Temp Source: Axillary (10/08 0000) BP: 99/57 (10/08 0200) Pulse Rate: 57 (10/08 0200)  Labs: Recent Labs    03/25/20 1229 03/25/20 1229 03/25/20 1627 03/25/20 1749 03/26/20 1010 03/26/20 1549 03/27/20 0109  HGB 9.2*   < >  --   --  8.6*  --  7.8*  HCT 28.7*  --   --   --  28.4*  --  26.0*  PLT 422*  --   --   --  359  --  325  APTT  --   --  24  --   --   --   --   LABPROT  --   --   --  13.5  --   --   --   INR  --   --   --  1.1  --   --   --   HEPARINUNFRC  --   --   --   --  0.39 0.48 0.29*  CREATININE 0.60  --   --   --  0.61  --  0.68   < > = values in this interval not displayed.    Estimated Creatinine Clearance: 71.9 mL/min (by C-G formula based on SCr of 0.68 mg/dL).  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  Heparin just below goal range after previous dose change.   Goal of Therapy:  Heparin level 0.3-0.5 units/ml Monitor platelets by anticoagulation protocol: Yes   Plan:  Will continue heparin at current for now, recheck level in 6 hours, and adjust rate at that time if level drops further or remains the same  Phillis Knack, PharmD, BCPS

## 2020-03-27 NOTE — Progress Notes (Signed)
Inpatient Rehab Admissions Coordinator Note:   Per PT/OT/ST recommendations, pt was screened for CIR candidacy by Gayland Curry, MS, CCC-SLP.  At this time we are recommending an Inpatient Rehab consult.  [facility] will consult attending to request consult order.  Please contact me with questions.    Gayland Curry, [place], [place] Admissions Coordinator [phone] 03/27/20 4:15 PM

## 2020-03-27 NOTE — Evaluation (Signed)
Occupational Therapy Evaluation Patient Details Name: Erica Rosario MRN: [id number] DOB: [birth year] Today's Date: 03/27/2020    History of Present Illness Erica Rosario is a 44 y.o. female no PMH, presented to [facility] regional hospital this afternoon with complaints of headache and neck stiffness.Brain imaging revealed ICH which is close to the superior left parietal lobe in proximity with the venous drainage making suspicion for a venous infarct that might have bled.MRI brain confirmed the bleed and also revealed a cerebral venous thrombosis on superior sagittal sinus higher up with a thrombus also in the draining cortical vein.   Clinical Impression   This 44 yo female admitted with above presents to acute OT with PLOF of being totally independent with her basic ADLs, IADLs, driving, and working occasionally as a Regulatory affairs officer. Currently she is setup/S-min A with basic ADLs with a very slow pace when she is up on her feet due to feeling dizzy and decreased vision on the right. She will benefit from acute OT with follow up OT on CIR to work on balance and vision which are affecting her safety and independence with basic ADLs. We will continue to follow.    Follow Up Recommendations  CIR;Supervision/Assistance - 24 hour    Equipment Recommendations  Tub/shower seat       Precautions / Restrictions Precautions Precautions: Fall Restrictions Weight Bearing Restrictions: No      Mobility Bed Mobility Overal bed mobility: Needs Assistance Bed Mobility: Supine to Sit     Supine to sit: Min guard     General bed mobility comments: Pt reported feeling dizzy when she first sat up (tendency to keep eyes shut--reports she feels like she is moving not the room)  Transfers Overall transfer level: Needs assistance Equipment used: 1 person hand held assist Transfers: Sit to/from [facility] Sit to Stand: Min assist Stand pivot transfers: Min assist;+2 safety/equipment             Balance Overall balance assessment: Needs assistance Sitting-balance support: No upper extremity supported;Feet unsupported Sitting balance-Leahy Scale: Good Sitting balance - Comments: Able to sit EOB and donn socks by crossing one leg over the other without LOB, did have to take extra time before she did this due to feeling dizzy   Standing balance support: Single extremity supported Standing balance-Leahy Scale: Poor Standing balance comment: reports dizziness with this position change as well, but abates with time. Reports she feels off balance on her feet                           ADL either performed or assessed with clinical judgement   ADL Overall ADL's : Needs assistance/impaired Eating/Feeding: Supervision/ safety;Sitting Eating/Feeding Details (indicate cue type and reason): due to vision Grooming: Set up;Supervision/safety;Sitting Grooming Details (indicate cue type and reason): due to vision Upper Body Bathing: Set up;Supervision/ safety;Sitting Upper Body Bathing Details (indicate cue type and reason): due to vision Lower Body Bathing: Minimal assistance;Sit to/from stand   Upper Body Dressing : Supervision/safety;Set up;Sitting Upper Body Dressing Details (indicate cue type and reason): due to vision Lower Body Dressing: Minimal assistance;Sit to/from stand   Toilet Transfer: Minimal assistance;Ambulation Toilet Transfer Details (indicate cue type and reason): bed>recliner>door>back to recliner (slow pace due to decreased balance) Toileting- Clothing Manipulation and Hygiene: Minimal assistance;Sit to/from stand               Vision Baseline Vision/History: Wears glasses Wears Glasses: Reading only Patient Visual Report:  No change from baseline Vision Assessment?: Yes Eye Alignment: Within Functional Limits Ocular Range of Motion: Within Functional Limits Tracking/Visual Pursuits: Able to track stimulus in all quads without  difficulty Saccades: Additional eye shifts occurred during testing;Decreased speed of saccadic movement Visual Fields:  (definitely right eye with lateral vision deficits unsure if left eye with right visual deficits as well)            Pertinent Vitals/Pain Pain Assessment: No/denies pain     Hand Dominance  right   Extremity/Trunk Assessment Upper Extremity Assessment Upper Extremity Assessment: RUE deficits/detail RUE Deficits / Details: very slight drift when eyes are closed and asked to keep arms up in the air           Communication  prefers Spanish   Cognition Arousal/Alertness: Awake/alert Behavior During Therapy: WFL for tasks assessed/performed Overall Cognitive Status: Impaired/Different from baseline Area of Impairment: Safety/judgement;Problem solving                         Safety/Judgement: Decreased awareness of safety;Decreased awareness of deficits   Problem Solving: Requires verbal cues General Comments: Due to decreased vision on right she is unaware of items on her right              Home Living Family/patient expects to be discharged to:: Private residence Living Arrangements: Spouse/significant other Available Help at Discharge: Family;Available 24 hours/day Type of Home: House Home Access: Level entry     Home Layout: Multi-level (pt does not use basement)     Bathroom Shower/Tub: Tub/shower unit;Curtain   Bathroom Toilet: Standard     Home Equipment: Grab bars - tub/shower;Hand held shower head;Shower seat;Grab bars - toilet (toilet bar on left)                   OT Problem List: Decreased activity tolerance;Impaired balance (sitting and/or standing);Impaired vision/perception;Decreased safety awareness;Decreased cognition      OT Treatment/Interventions: Self-care/ADL training;DME and/or AE instruction;Patient/family education;Balance training;Visual/perceptual remediation/compensation    OT Goals(Current goals  can be found in the care plan section) Acute Rehab OT Goals Patient Stated Goal: to get better and go home OT Goal Formulation: With patient/family Time For Goal Achievement: 04/10/20 Potential to Achieve Goals: Good  OT Frequency: Min 2X/week           Co-evaluation PT/OT/SLP Co-Evaluation/Treatment: Yes Reason for Co-Treatment: For patient/therapist safety;To address functional/ADL transfers   OT goals addressed during session: Strengthening/ROM;ADL's and self-care      AM-PAC OT "6 Clicks" Daily Activity     Outcome Measure Help from another person eating meals?: A Little Help from another person taking care of personal grooming?: A Little Help from another person toileting, which includes using toliet, bedpan, or urinal?: A Little Help from another person bathing (including washing, rinsing, drying)?: A Little Help from another person to put on and taking off regular upper body clothing?: A Little Help from another person to put on and taking off regular lower body clothing?: A Little 6 Click Score: 18   End of Session Equipment Utilized During Treatment: Gait belt Nurse Communication: Mobility status (pt reports nausea, decreased vision on right)  Activity Tolerance:  (limited by intermittent dizziness and at end of session reported nausea as well) Patient left: in chair;with call bell/phone within reach;with chair alarm set;with family/visitor present  OT Visit Diagnosis: Unsteadiness on feet (R26.81);Other abnormalities of gait and mobility (R26.89);Low vision, both eyes (H54.2)  Time: 4235-3614 OT Time Calculation (min): 44 min Charges:  OT General Charges $OT Visit: 1 Visit OT Evaluation $OT Eval Moderate Complexity: 1 Mod OT Treatments $Self Care/Home Management : 8-22 mins  Golden Circle, OTR/L Acute [employer] Pager [phone] Office [phone]     Almon Register 03/27/2020, 1:38 PM

## 2020-03-27 NOTE — Procedures (Signed)
Patient Name: Erica Rosario  MRN: [id number]  Epilepsy Attending: Lora Havens  Referring Physician/Provider: Dr Amie Portland Duration: 03/27/2020 0636 to 03/27/2020 1124  Patient history: 44yo F with venous ischemic infarct with hemorrhagic transformation presented with seizure. EEG to evaluate for seizure.   Level of alertness: awake, asleep  AEDs during EEG study: LEV  Technical aspects: This EEG study was done with scalp electrodes positioned according to the 10-20 International system of electrode placement. Electrical activity was acquired at a sampling rate of 500Hz  and reviewed with a high frequency filter of 70Hz  and a low frequency filter of 1Hz . EEG data were recorded continuously and digitally stored.   Description: EEG showed posterior dominant rhythm of 9 Hz activity of moderate voltage (25-35 uV) seen predominantly in posterior head regions, symmetric and reactive to eye opening and eye closing. Sleep was characterized by vertex waves, sleep spindles (12-14hz ), maximal frontocentral region. EEG also showed continuous 3-6Hz  theta- delta slowing in left frontotemporal region. Spikes were also seen in bilateral parieto-occipital region. Hyperventilation and photic stimulation were not performed.     ABNORMALITY - Continuous slow, left frontotemporal - Spikes, bilateral parieto-occipital region  IMPRESSION: This study showed evidence of epileptogenicity arising from bilateral parieto-occipital region as well as cortical dysfunction in left frontotemporal region likely secondary to underlying hemorrhage. No seizures were seen during this study.  Shia Delaine Barbra Sarks

## 2020-03-27 NOTE — Progress Notes (Signed)
LTM EEG discontinued - no skin breakdown at unhook.   

## 2020-03-27 NOTE — Progress Notes (Signed)
Pt transferred to 3 [facility] room 6. Husband with pt. Report given to [facility].

## 2020-03-27 NOTE — Progress Notes (Signed)
OT Cancellation Note  Patient Details Name: Erica Rosario MRN: [id number] DOB: 1975/07/13   Cancelled Treatment:    Reason Eval/Treat Not Completed: Active bedrest order. Pt continues on bedrest, will monitor for increased activity orders and eval as appropriate.  Golden Circle, OTR/L Acute Rehab Services Pager [phone] Office [phone]     Erica Rosario 03/27/2020, 8:29 AM

## 2020-03-27 NOTE — Progress Notes (Signed)
ANTICOAGULATION CONSULT NOTE - Follow Up Consult  Pharmacy Consult for Heparin Indication: Dural SVT with Hemorrhagic Conversion  No Known Allergies  Patient Measurements: Height: 4\' 11"  (149.9 cm) Weight: 63.5 kg (139 lb 15.9 oz) IBW/kg (Calculated) : 43.2 Heparin Dosing Weight: 56.8 kg  Vital Signs: Temp: 98.4 F (36.9 C) (10/08 1554) Temp Source: Oral (10/08 1554) BP: 114/62 (10/08 1554) Pulse Rate: 56 (10/08 1554)  Labs: Recent Labs    03/25/20 1229 03/25/20 1229 03/25/20 1627 03/25/20 1749 03/26/20 1010 03/26/20 1549 03/27/20 0109 03/27/20 0704 03/27/20 1604  HGB 9.2*   < >  --   --  8.6*  --  7.8* 7.9*  --   HCT 28.7*   < >  --   --  28.4*  --  26.0* 26.6*  --   PLT 422*  --   --   --  359  --  325  --   --   APTT  --   --  24  --   --   --   --   --   --   LABPROT  --   --   --  13.5  --   --   --   --   --   INR  --   --   --  1.1  --   --   --   --   --   HEPARINUNFRC  --   --   --   --  0.39   < > 0.29* 0.29* 0.38  CREATININE 0.60  --   --   --  0.61  --  0.68  --   --    < > = values in this interval not displayed.    Estimated Creatinine Clearance: 71.9 mL/min (by C-G formula based on SCr of 0.68 mg/dL).  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  Heparin level ~6 hrs after heparin infusion was increased to 900 units/hr was 0.38 units/ml, which is within the goal range for this pt. H/H 7.9/26.6, platelets 325. Per RN, no issues with IV or bleeding observed.  Goal of Therapy:  Heparin level 0.3-0.5 units/ml Monitor platelets by anticoagulation protocol: Yes   Plan:  Continue heparin infusion at 900 units/hr Check confirmatory heparin level in ~6 hrs Monitor daily heparin level, CBC Monitor for signs and symptoms of bleeding  Gillermina Hu, PharmD, BCPS, [facility] Clinical Pharmacist 03/27/2020, 6:52 PM

## 2020-03-27 NOTE — Evaluation (Signed)
Physical Therapy Evaluation Patient Details Name: Erica Rosario MRN: [id number] DOB: 10-01-1975 Today's Date: 03/27/2020   History of Present Illness  Erica Rosario is a 44 y.o. female no PMH, presented to [facility] regional hospital this afternoon with complaints of headache and neck stiffness.Brain imaging revealed ICH which is close to the superior left parietal lobe in proximity with the venous drainage making suspicion for a venous infarct that might have bled.MRI brain confirmed the bleed and also revealed a cerebral venous thrombosis on superior sagittal sinus higher up with a thrombus also in the draining cortical vein.  Clinical Impression  Pt presents to PT with deficits in functional mobility, gait, balance, vision, attention. Pt reports dizziness throughout session which is improved with closure of one eye. Pt is generally unsteady and demonstrates impaired R sided vision, bumping into multiple objects on R side during ambulation. Pt also demonstrates impaired awareness of these deficits and is unable to implement compensatory strategies after provided by PT during session. Pt will benefit from continued acute PT POC to improve gait, balance, and safety awareness. PT recommends CIR placement at this time as the pt demonstrates the potential to return to independent mobility and has great family support.    Follow Up Recommendations CIR;Supervision/Assistance - 24 hour    Equipment Recommendations   (RW vs cane pending progress)    Recommendations for Other Services Rehab consult     Precautions / Restrictions Precautions Precautions: Fall Precaution Comments: R sided vision deficits Restrictions Weight Bearing Restrictions: No      Mobility  Bed Mobility Overal bed mobility: Needs Assistance Bed Mobility: Supine to Sit     Supine to sit: Min guard     General bed mobility comments: Pt reported feeling dizzy when she first sat up (tendency to keep eyes shut--reports  she feels like she is moving not the room)  Transfers Overall transfer level: Needs assistance Equipment used: 1 person hand held assist Transfers: Sit to/from [facility] Sit to Stand: Min assist Stand pivot transfers: Min assist;+2 safety/equipment       General transfer comment: increased lateral sway  Ambulation/Gait Ambulation/Gait assistance: Min assist Gait Distance (Feet): 25 Feet Assistive device: 1 person hand held assist Gait Pattern/deviations: Step-to pattern Gait velocity: reduced Gait velocity interpretation: <1.31 ft/sec, indicative of household ambulator General Gait Details: pt with increased lateral sway, impaired attention to right side bumping into interpreter Ipad as well as hospital bed  Stairs            Wheelchair Mobility    Modified Rankin (Stroke Patients Only) Modified Rankin (Stroke Patients Only) Pre-Morbid Rankin Score: No symptoms Modified Rankin: Moderately severe disability     Balance Overall balance assessment: Needs assistance Sitting-balance support: No upper extremity supported;Feet unsupported Sitting balance-Leahy Scale: Good Sitting balance - Comments: Able to sit EOB and donn socks by crossing one leg over the other without LOB, did have to take extra time before she did this due to feeling dizzy   Standing balance support: Single extremity supported Standing balance-Leahy Scale: Poor Standing balance comment: reports dizziness with this position change as well, but abates with time. Reports she feels off balance on her feet                             Pertinent Vitals/Pain Pain Assessment: No/denies pain    Home Living Family/patient expects to be discharged to:: Private residence Living Arrangements: Spouse/significant other Available  Help at Discharge: Family;Available 24 hours/day Type of Home: House Home Access: Level entry     Home Layout: Multi-level (basement, does not need to  access) Home Equipment: Grab bars - tub/shower;Hand held shower head;Shower seat;Grab bars - toilet      Prior Function Level of Independence: Independent         Comments: works sewing     Journalist, newspaper        Extremity/Trunk Assessment   Upper Extremity Assessment Upper Extremity Assessment: RUE deficits/detail RUE Deficits / Details: very slight drift when eyes are closed and asked to keep arms up in the air    Lower Extremity Assessment Lower Extremity Assessment: Overall WFL for tasks assessed    Cervical / Trunk Assessment Cervical / Trunk Assessment: Normal  Communication   Communication: Prefers language other than English  Cognition Arousal/Alertness: Awake/alert Behavior During Therapy: WFL for tasks assessed/performed Overall Cognitive Status: Impaired/Different from baseline Area of Impairment: Safety/judgement;Problem solving                         Safety/Judgement: Decreased awareness of safety;Decreased awareness of deficits Awareness: Emergent Problem Solving: Requires verbal cues General Comments: Due to decreased vision on right she is unaware of items on her right      General Comments General comments (skin integrity, edema, etc.): VSS on RA, BP stable despite reports of dizziness. Pt with slowed coordination of RUE as well as impaired saccades to R side    Exercises     Assessment/Plan    PT Assessment Patient needs continued PT services  PT Problem List Decreased balance;Decreased mobility;Decreased safety awareness;Decreased knowledge of precautions       PT Treatment Interventions DME instruction;Gait training;Functional mobility training;Balance training;Therapeutic activities;Neuromuscular re-education;Cognitive remediation;Patient/family education    PT Goals (Current goals can be found in the Care Plan section)  Acute Rehab PT Goals Patient Stated Goal: to get better and go home PT Goal Formulation: With  patient/family Time For Goal Achievement: 04/10/20 Potential to Achieve Goals: Good    Frequency Min 4X/week   Barriers to discharge        Co-evaluation PT/OT/SLP Co-Evaluation/Treatment: Yes Reason for Co-Treatment: For patient/therapist safety;To address functional/ADL transfers PT goals addressed during session: Mobility/safety with mobility;Balance;Strengthening/ROM OT goals addressed during session: Strengthening/ROM;ADL's and self-care       AM-PAC PT "6 Clicks" Mobility  Outcome Measure Help needed turning from your back to your side while in a flat bed without using bedrails?: None Help needed moving from lying on your back to sitting on the side of a flat bed without using bedrails?: None Help needed moving to and from a bed to a chair (including a wheelchair)?: A Little Help needed standing up from a chair using your arms (e.g., wheelchair or bedside chair)?: A Little Help needed to walk in hospital room?: A Little Help needed climbing 3-5 steps with a railing? : A Lot 6 Click Score: 19    End of Session Equipment Utilized During Treatment: Gait belt Activity Tolerance: Patient tolerated treatment well Patient left: in chair;with call bell/phone within reach;with chair alarm set;with family/visitor present Nurse Communication: Mobility status PT Visit Diagnosis: Unsteadiness on feet (R26.81);Other abnormalities of gait and mobility (R26.89);Other symptoms and signs involving the nervous system (R29.898)    Time: 9604-5409 PT Time Calculation (min) (ACUTE ONLY): 42 min   Charges:   PT Evaluation $PT Eval Moderate Complexity: 1 Mod  Zenaida Niece, PT, DPT Acute Rehabilitation Pager: [phone]   Zenaida Niece 03/27/2020, 3:25 PM

## 2020-03-27 NOTE — Progress Notes (Addendum)
STROKE TEAM PROGRESS NOTE   INTERVAL HISTORY Husband at bedside.  Spanish interpreter via [employer].  Patient still complaining of mild headache bilateral frontal area, no nausea vomiting.  Slight lethargic today, but as per RN, neuro unchanged.  On heparin IV, pending CT in a.m.  Pan CT no malignancy but found to have nonocclusive thrombus identified in both the left and right renal veins.  Vitals:   03/27/20 0600 03/27/20 0700 03/27/20 0800 03/27/20 0900  BP:   101/64 105/64  Pulse: (!) 55 73 (!) 54 68  Resp: 15 17 (!) 0 19  Temp:      TempSrc:      SpO2: 99% 98% 98% 99%  Weight:      Height:       CBC:  Recent Labs  Lab 03/25/20 1229 03/25/20 1229 03/26/20 1010 03/26/20 1010 03/27/20 0109 03/27/20 0704  WBC 9.9   < > 13.9*  --  8.9  --   NEUTROABS 7.2  --   --   --   --   --   HGB 9.2*   < > 8.6*   < > 7.8* 7.9*  HCT 28.7*   < > 28.4*   < > 26.0* 26.6*  MCV 70.2*   < > 74.2*  --  73.7*  --   PLT 422*   < > 359  --  325  --    < > = values in this interval not displayed.   Basic Metabolic Panel:  Recent Labs  Lab 03/26/20 1010 03/27/20 0109  NA 137 137  K 3.9 3.7  CL 109 111  CO2 16* 19*  GLUCOSE 94 96  BUN 6 6  CREATININE 0.61 0.68  CALCIUM 8.2* 7.8*   Lipid Panel:  Recent Labs  Lab 03/26/20 1010  CHOL 210*  TRIG 132  HDL 53  CHOLHDL 4.0  VLDL 26  LDLCALC NOT CALCULATED   HgbA1c:  Recent Labs  Lab 03/26/20 0353  HGBA1C 5.6   Urine Drug Screen:  Recent Labs  Lab 03/26/20 1647  LABOPIA NONE DETECTED  COCAINSCRNUR NONE DETECTED  LABBENZ POSITIVE*  AMPHETMU NONE DETECTED  THCU NONE DETECTED  LABBARB NONE DETECTED    Alcohol Level No results for input(s): ETH in the last 168 hours.  IMAGING past 24 hours CT CHEST ABDOMEN PELVIS W CONTRAST  Result Date: 03/27/2020 CLINICAL DATA:  Left parietooccipital lobe venous infarct. EXAM: CT CHEST, ABDOMEN, AND PELVIS WITH CONTRAST TECHNIQUE: Multidetector CT imaging of the chest, abdomen and pelvis  was performed following the standard protocol during bolus administration of intravenous contrast. CONTRAST:  165mL OMNIPAQUE IOHEXOL 300 MG/ML  SOLN COMPARISON:  None. FINDINGS: CT CHEST FINDINGS Cardiovascular: The heart size is normal. No substantial pericardial effusion. No thoracic aortic aneurysm. Mediastinum/Nodes: No mediastinal lymphadenopathy. There is no hilar lymphadenopathy. The esophagus has normal imaging features. There is no axillary lymphadenopathy. Lungs/Pleura: Dependent basilar atelectasis noted in both lower lobes. Somewhat confluent opacity in the left lower lobe on 77/7 is probably atelectatic although component of infectious/inflammatory airspace disease not entirely excluded. No pleural effusion. Musculoskeletal: No worrisome lytic or sclerotic osseous abnormality. CT ABDOMEN PELVIS FINDINGS Hepatobiliary: 11 mm hypervascular lesion noted inferior right liver (54/3). Geographic fatty deposition noted in the liver parenchyma with some sparing along the gallbladder fossa. There is no evidence for gallstones, gallbladder wall thickening, or pericholecystic fluid. No intrahepatic or extrahepatic biliary dilation. Pancreas: No focal mass lesion. No dilatation of the main duct. No intraparenchymal cyst. No  peripancreatic edema. Spleen: No splenomegaly. No focal mass lesion. Adrenals/Urinary Tract: No adrenal nodule or mass. Kidneys unremarkable. No evidence for hydroureter. The urinary bladder appears normal for the degree of distention. Stomach/Bowel: Stomach is unremarkable. No gastric wall thickening. No evidence of outlet obstruction. Duodenum is normally positioned as is the ligament of Treitz. No small bowel wall thickening. No small bowel dilatation. The terminal ileum is normal. The appendix is normal. No gross colonic mass. No colonic wall thickening. Vascular/Lymphatic: No abdominal aortic aneurysm. No abdominal aortic atherosclerotic calcification. The portal vein, superior mesenteric  vein and splenic vein are patent. Nonocclusive filling defects are identified in both left renal veins, visible on axial image 59/3 and confirmed on renal delay image 16/8. Both defects are well demonstrated on coronal imaging (see left renal vein on 69/5 and right renal vein on 74/5). Reproductive: Uterus appears heterogeneous with probable posterior left subserosal fibroid. Uterus measures 4.8 x 11.9 x 6.5 cm. There is no adnexal mass. Other: No intraperitoneal free fluid. Musculoskeletal: No worrisome lytic or sclerotic osseous abnormality. IMPRESSION: 1. Nonocclusive thrombus identified in both the left and right renal veins. 2. 11 mm hypervascular lesion noted inferior right liver. This is probably benign and may be a a flash filling hemangioma or vascular malformation. Abdominal MRI without and with contrast recommended to further evaluate. 3. Dependent basilar atelectasis in both lower lobes. Somewhat confluent opacity in the left lower lobe is probably atelectatic although component of infectious/inflammatory airspace disease not entirely excluded. 4. Hepatic steatosis. Electronically Signed   By: Misty Stanley M.D.   On: 03/27/2020 07:25   ECHOCARDIOGRAM COMPLETE  Result Date: 03/26/2020    ECHOCARDIOGRAM REPORT   Patient Name:   Erica Rosario Date of Exam: 03/26/2020 Medical Rec #:  [id number]       Height:       59.1 in Accession #:    [id number]      Weight:       140.0 lb Date of Birth:  1975-12-12      BSA:          1.586 m Patient Age:    44 years        BP:           113/59 mmHg Patient Gender: F               HR:           77 bpm. Exam Location:  Inpatient Procedure: 2D Echo Indications:    Stroke I163.9  History:        Patient has no prior history of Echocardiogram examinations.                 Intracerebral Hemorrhage.  Sonographer:    Mikki Santee RDCS (AE) Referring Phys: [id number] [place]  1. Left ventricular ejection fraction, by estimation, is 60 to 65%. The left  ventricle has normal function. The left ventricle has no regional wall motion abnormalities. Left ventricular diastolic parameters were normal.  2. Right ventricular systolic function is normal. The right ventricular size is normal. There is normal pulmonary artery systolic pressure. The estimated right ventricular systolic pressure is 29.5 mmHg.  3. The mitral valve is normal in structure. No evidence of mitral valve regurgitation. No evidence of mitral stenosis.  4. The aortic valve is tricuspid. Aortic valve regurgitation is not visualized. No aortic stenosis is present.  5. The inferior vena cava is dilated in size with >50% respiratory variability, suggesting right atrial  pressure of 8 mmHg. FINDINGS  Left Ventricle: Left ventricular ejection fraction, by estimation, is 60 to 65%. The left ventricle has normal function. The left ventricle has no regional wall motion abnormalities. The left ventricular internal cavity size was normal in size. There is  no left ventricular hypertrophy. Left ventricular diastolic parameters were normal. Right Ventricle: The right ventricular size is normal. No increase in right ventricular wall thickness. Right ventricular systolic function is normal. There is normal pulmonary artery systolic pressure. The tricuspid regurgitant velocity is 2.01 m/s, and  with an assumed right atrial pressure of 8 mmHg, the estimated right ventricular systolic pressure is 40.0 mmHg. Left Atrium: Left atrial size was normal in size. Right Atrium: Right atrial size was normal in size. Pericardium: There is no evidence of pericardial effusion. Mitral Valve: The mitral valve is normal in structure. No evidence of mitral valve regurgitation. No evidence of mitral valve stenosis. Tricuspid Valve: The tricuspid valve is normal in structure. Tricuspid valve regurgitation is trivial. Aortic Valve: The aortic valve is tricuspid. Aortic valve regurgitation is not visualized. No aortic stenosis is present.  Pulmonic Valve: The pulmonic valve was normal in structure. Pulmonic valve regurgitation is not visualized. Aorta: The aortic root is normal in size and structure. Venous: The inferior vena cava is dilated in size with greater than 50% respiratory variability, suggesting right atrial pressure of 8 mmHg. IAS/Shunts: No atrial level shunt detected by color flow Doppler.  LEFT VENTRICLE PLAX 2D LVIDd:         4.50 cm  Diastology LVIDs:         3.10 cm  LV e' medial:    12.20 cm/s LV PW:         0.90 cm  LV E/e' medial:  6.3 LV IVS:        0.70 cm  LV e' lateral:   8.59 cm/s LVOT diam:     1.90 cm  LV E/e' lateral: 8.9 LV SV:         65 LV SV Index:   41 LVOT Area:     2.84 cm  RIGHT VENTRICLE RV S prime:     13.40 cm/s TAPSE (M-mode): 1.7 cm LEFT ATRIUM             Index       RIGHT ATRIUM           Index LA diam:        3.40 cm 2.14 cm/m  RA Area:     11.80 cm LA Vol (A2C):   49.1 ml 30.96 ml/m RA Volume:   23.10 ml  14.57 ml/m LA Vol (A4C):   32.1 ml 20.24 ml/m LA Biplane Vol: 41.8 ml 26.36 ml/m  AORTIC VALVE LVOT Vmax:   123.00 cm/s LVOT Vmean:  86.500 cm/s LVOT VTI:    0.230 m  AORTA Ao Root diam: 2.70 cm MITRAL VALVE               TRICUSPID VALVE MV Area (PHT): 2.34 cm    TR Peak grad:   16.2 mmHg MV Decel Time: 324 msec    TR Vmax:        201.00 cm/s MV E velocity: 76.50 cm/s MV A velocity: 44.10 cm/s  SHUNTS MV E/A ratio:  1.73        Systemic VTI:  0.23 m  Systemic Diam: 1.90 cm Loralie Champagne MD Electronically signed by Loralie Champagne MD Signature Date/Time: 03/26/2020/5:01:38 PM    Final     PHYSICAL EXAM    Temp:  [98.8 F (37.1 C)-99.9 F (37.7 C)] 99 F (37.2 C) (10/08 0400) Pulse Rate:  [54-88] 68 (10/08 0900) Resp:  [0-23] 19 (10/08 0900) BP: (99-118)/(56-68) 105/64 (10/08 0900) SpO2:  [98 %-100 %] 99 % (10/08 0900)  General - Well nourished, well developed, in no apparent distress.  Ophthalmologic - fundi not visualized due to  noncooperation.  Cardiovascular - Regular rhythm and rate.  Mental Status -  Level of arousal and orientation to time, place, and person were intact. Language including expression, naming, repetition, comprehension was assessed and found intact. Fund of Knowledge was assessed and was intact.  Cranial Nerves II - XII - II - Visual field right hemianopia but able to see shadow on the right visual field. III, IV, VI - Extraocular movements intact. V - Facial sensation intact bilaterally. VII - Facial movement intact bilaterally. VIII - Hearing & vestibular intact bilaterally. X - Palate elevates symmetrically. XI - Chin turning & shoulder shrug intact bilaterally. XII - Tongue protrusion intact.  Motor Strength - The patient's strength was normal in all extremities and pronator drift was absent.  Bulk was normal and fasciculations were absent.   Motor Tone - Muscle tone was assessed at the neck and appendages and was normal.  Reflexes - The patient's reflexes were symmetrical in all extremities and she had no pathological reflexes.  Sensory - Light touch, temperature/pinprick were assessed and were symmetrical.    Coordination - The patient had normal movements in the hands with no ataxia or dysmetria.  Tremor was absent.  Gait and Station - deferred.   ASSESSMENT/PLAN Erica Rosario is a 44 y.o. female with no PMH presenting ro [facility] with HA and neck stiffness.   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 Also thrombosis draining cortical vein. 2 areas enhancement in L occipital hemorrhage possible ongoing hemorrhage. Similar edema w/ slight R shift.  CT head 10/6  2028 venous infarct w/ hemorrhagic transformation L parietal slight increase edema, most hemorrhage foci unchanged. Single new hemorrhage at vertex. Hyperdense sagittal sinus (thrombosis).  CT head 10/6 2251 stable  Pan CT neg for malignancy, B renal vein nonocclusive thrombosis.  CT/CTA/CTV pending in am  2D Echo EF 60-65%. No source of embolus   LDL UTC, TC 210, TG 132, direct LDL 151.9  HgbA1c 5.6  UDS +benzos  Hypercoagulable labs pending  (prot S total and activity mildly low - not accurate on heparin)  VTE prophylaxis - heparin IV and SCDs   No antithrombotic prior to admission, now on heparin IV.   Therapy recommendations:  pending   Disposition:  pending   Seizure   GTC seizure in ER   S/p ativan and keppra load  Continue keppra 500 bid  LTM EEG - L frontotemporal continuous slowing, B parieto-occipital spikes   D/c LTM EEG  Seizure precautions  ?? Hypercoagulable state  Cerebral central venous thrombosis  Bilateral renal vein nonocclusive thrombosis  OCP use  Hypercoagulable work-up pending  (prot S total and activity mildly low - but on heparin IV so  not accurate)  On heparin IV  OCP use  On OTC 28 for contraception   Not smoker  Recommend to avoid OCP use, especially estrogen use  Hyperlipidemia  LDL UTC, TC 210, TG 132, direct LDL 151.9  on no statin PTA  Add lipitor 40  Continue lipitor on discharge   Other Stroke Risk Factors  Hx ETOH use  Overweight, Body mass index is 28.27 kg/m., recommend weight loss, diet and exercise as appropriate   Other Active Problems  Anemia, Hgb 9.2->8.6->7.8->7.9  Leukocytosis WBC 9.9->13.9->8.9 UA neg  Hypokalemia, K 3.2->3.9->3.7    Hospital day # 2  This patient is critically ill due to hypercoagulable state, cerebral venous thrombosis, renal vein thrombosis, venous infarct with hemorrhagic conversion, seizure and at significant risk of neurological worsening, death form venous  thrombosis tension, hemorrhage due to heparin, status epilepticus, brain herniation, cerebral edema. This patient's care requires constant monitoring of vital signs, hemodynamics, respiratory and cardiac monitoring, review of multiple databases, neurological assessment, discussion with family, other specialists and medical decision making of high complexity. I spent 40 minutes of neurocritical care time in the care of this patient. I had long discussion with patient and husband at bedside, updated pt current condition, treatment plan and potential prognosis, and answered all the questions.  They expressed understanding and appreciation.    Rosalin Hawking, MD PhD Stroke Neurology 03/27/2020 10:55 AM   To contact Stroke Continuity provider, please refer to [email or web address] After hours, contact General Neurology

## 2020-03-28 ENCOUNTER — Inpatient Hospital Stay (HOSPITAL_COMMUNITY): Payer: Medicaid Other

## 2020-03-28 ENCOUNTER — Encounter (HOSPITAL_COMMUNITY): Payer: Self-pay | Admitting: Student in an Organized Health Care Education/Training Program

## 2020-03-28 DIAGNOSIS — I610 Nontraumatic intracerebral hemorrhage in hemisphere, subcortical: Secondary | ICD-10-CM

## 2020-03-28 LAB — HEPARIN LEVEL (UNFRACTIONATED)
Heparin Unfractionated: 0.62 IU/mL (ref 0.30–0.70)
Heparin Unfractionated: 0.58 IU/mL (ref 0.30–0.70)

## 2020-03-28 LAB — BASIC METABOLIC PANEL
Anion gap: 10 (ref 5–15)
BUN: <5 mg/dL — ABNORMAL LOW (ref 6–20)
CO2: 19 mmol/L — ABNORMAL LOW (ref 22–32)
Calcium: 7.9 mg/dL — ABNORMAL LOW (ref 8.9–10.3)
Chloride: 110 mmol/L (ref 98–111)
Creatinine, Ser: 0.62 mg/dL (ref 0.44–1.00)
GFR, Estimated: >60 mL/min (ref >60)
Glucose, Bld: 86 mg/dL (ref 70–99)
Potassium: 3.3 mmol/L — ABNORMAL LOW (ref 3.5–5.1)
Sodium: 139 mmol/L (ref 135–145)

## 2020-03-28 LAB — CBC
HCT: 25.8 % — ABNORMAL LOW (ref 36.0–46.0)
Hemoglobin: 7.6 g/dL — ABNORMAL LOW (ref 12.0–15.0)
MCH: 22 pg — ABNORMAL LOW (ref 26.0–34.0)
MCHC: 29.5 g/dL — ABNORMAL LOW (ref 30.0–36.0)
MCV: 74.8 fL — ABNORMAL LOW (ref 80.0–100.0)
Platelets: 312 10*3/uL (ref 150–400)
RBC: 3.45 MIL/uL — ABNORMAL LOW (ref 3.87–5.11)
RDW: 18.9 % — ABNORMAL HIGH (ref 11.5–15.5)
WBC: 8 10*3/uL (ref 4.0–10.5)
nRBC: 0 % (ref 0.0–0.2)

## 2020-03-28 MED ORDER — IOHEXOL 350 MG/ML SOLN
75.0000 mL | Freq: Once | INTRAVENOUS | Status: AC | PRN
  Administered 2020-03-28: 75 mL via INTRAVENOUS

## 2020-03-28 MED ORDER — WARFARIN SODIUM 6 MG PO TABS
6.0000 mg | ORAL_TABLET | Freq: Once | ORAL | Status: AC
  Administered 2020-03-28: 6 mg via ORAL
  Filled 2020-03-28: qty 1

## 2020-03-28 MED ORDER — COUMADIN BOOK
Freq: Once | Status: DC
  Filled 2020-03-28 (×2): qty 1

## 2020-03-28 MED ORDER — POTASSIUM CHLORIDE CRYS ER 20 MEQ PO TBCR
20.0000 meq | EXTENDED_RELEASE_TABLET | Freq: Two times a day (BID) | ORAL | Status: AC
  Administered 2020-03-28 – 2020-03-29 (×4): 20 meq via ORAL
  Filled 2020-03-28 (×4): qty 1

## 2020-03-28 MED ORDER — WARFARIN - PHARMACIST DOSING INPATIENT: Freq: Every day | Status: DC

## 2020-03-28 NOTE — Progress Notes (Signed)
STROKE TEAM PROGRESS NOTE   INTERVAL HISTORY Family member present and acts as interpreter. Pt speaks little [place]. Start warfarin per pharmacy. Pt instructed not to take BCPs.  She states she is doing well.  She had mild headache yesterday which responded to Tylenol.  She has no complaints this morning.  She wants to go home.  Hypercoagulable lab work show slightly decreased protein S and IgM antiphospholipid antibodies marginally elevated at 19.  Patient has had a history of only one miscarriage at 4 weeks but no history of spontaneous DVT or pulmonary embolism in the past  Vitals:   03/27/20 2331 03/28/20 0413 03/28/20 0832 03/28/20 1154  BP: (!) 99/54 (!) 105/51 116/68 111/64  Pulse: (!) 57 (!) 57 60 63  Resp: 18 17 18 18   Temp: 98.9 F (37.2 C) 98.8 F (37.1 C) 98.4 F (36.9 C) 98.7 F (37.1 C)  TempSrc: Oral Oral Oral Oral  SpO2: 99% 99% 100% 100%  Weight:      Height:       CBC:  Recent Labs  Lab 03/25/20 1229 03/26/20 1010 03/27/20 0109 03/27/20 0109 03/27/20 0704 03/28/20 0116  WBC 9.9   < > 8.9  --   --  8.0  NEUTROABS 7.2  --   --   --   --   --   HGB 9.2*   < > 7.8*   < > 7.9* 7.6*  HCT 28.7*   < > 26.0*   < > 26.6* 25.8*  MCV 70.2*   < > 73.7*  --   --  74.8*  PLT 422*   < > 325  --   --  312   < > = values in this interval not displayed.   Basic Metabolic Panel:  Recent Labs  Lab 03/27/20 0109 03/28/20 0116  NA 137 139  K 3.7 3.3*  CL 111 110  CO2 19* 19*  GLUCOSE 96 86  BUN 6 <5*  CREATININE 0.68 0.62  CALCIUM 7.8* 7.9*   Lipid Panel:  Recent Labs  Lab 03/26/20 1010  CHOL 210*  TRIG 132  HDL 53  CHOLHDL 4.0  VLDL 26  LDLCALC NOT CALCULATED   HgbA1c:  Recent Labs  Lab 03/26/20 0353  HGBA1C 5.6   Urine Drug Screen:  Recent Labs  Lab 03/26/20 1647  LABOPIA NONE DETECTED  COCAINSCRNUR NONE DETECTED  LABBENZ POSITIVE*  AMPHETMU NONE DETECTED  THCU NONE DETECTED  LABBARB NONE DETECTED    Alcohol Level No results for input(s):  ETH in the last 168 hours.  IMAGING past 24 hours CT ANGIO HEAD W OR WO CONTRAST  Result Date: 03/28/2020 CLINICAL DATA:  Follow-up venous sinus thrombosis with left parietal venous infarction. EXAM: CT HEAD WITHOUT CONTRAST CT arteriography and venography OF THE HEAD and neck TECHNIQUE: Contiguous axial images were obtained from the base of the skull through the vertex without intravenous contrast. Multidetector CT imaging of the head was performed using the standard protocol during bolus administration of intravenous contrast. Multiplanar CT image reconstructions and MIPs were obtained to evaluate the vascular anatomy. CONTRAST:  13mL OMNIPAQUE IOHEXOL 350 MG/ML SOLN COMPARISON:  Multiple previous exams most recently 03/25/2020 FINDINGS: CT HEAD Brain: Hemorrhagic infarction in the left parietal cortical and subcortical brain redemonstrated. Low-density is slightly increased, particularly along the anterior margin. Hemorrhagic foci are stable with no new or enlarging bleeds. Mild swelling with mass-effect and 1 or 2 mm of left-to-right shift and flattening of the left lateral ventricle. Vascular:  See below Skull: Negative Sinuses/Orbits: Clear/normal CTA neck: Aortic arch is normal. Right common carotid artery is widely patent to the bifurcation. Normal carotid bifurcation. Normal cervical ICA. Left common carotid artery widely patent to the bifurcation. Carotid bifurcation is normal. Normal left ICA. Both vertebral arteries widely patent at their origins and through the cervical region to the foramen magnum. CTA HEAD Anterior circulation: Both internal carotid arteries widely patent through the skull base and siphon regions. The anterior and middle cerebral vessels are normal without proximal stenosis, aneurysm or vascular malformation. Posterior circulation: Both vertebral arteries widely patent to the basilar. No basilar stenosis. Posterior circulation branch vessels are normal. Venous sinuses: See results  of CT venography. Anatomic variants: None significant. CT venography of the head Anterior aspect of the superior sagittal sinus is patent. Again demonstrated is partial thrombosis of the superior sagittal sinus at the vertex. This is not occlusive as flow can be demonstrated around the margins of the thrombus. This is fairly segmental, involving a 5-6 cm segment of the sinus. Beyond that, the distal superior sagittal sinus is patent. Both transverse sinuses are patent. Compared to the initial presentation noncontrast CT and MRI, the thrombus does not appear progressive. Thrombosed superficial draining vein on the left again visible, similar in appearance. IMPRESSION: 1. No arterial pathology in the neck. 2. No intracranial large or medium vessel occlusion or correctable proximal stenosis. 3. Partial thrombosis of the superior sagittal sinus at the vertex, not occlusive as flow can be demonstrated around the margins of the thrombus. This is fairly segmental, involving a 5-6 cm segment of the sinus. Beyond that, the distal superior sagittal sinus is patent. Thrombosed superficial draining vein on the left again visible, similar in appearance. 4. Hemorrhagic infarction in the left parietal cortical and subcortical brain redemonstrated. Low-density edema is slightly increased, particularly along the anterior margin. No new or enlarging bleeds. Mild swelling with mass-effect and 1 or 2 mm of left-to-right shift and flattening of the left lateral ventricle. Electronically Signed   By: Nelson Chimes M.D.   On: 03/28/2020 07:15   CT ANGIO NECK W OR WO CONTRAST  Result Date: 03/28/2020 CLINICAL DATA:  Follow-up venous sinus thrombosis with left parietal venous infarction. EXAM: CT HEAD WITHOUT CONTRAST CT arteriography and venography OF THE HEAD and neck TECHNIQUE: Contiguous axial images were obtained from the base of the skull through the vertex without intravenous contrast. Multidetector CT imaging of the head was  performed using the standard protocol during bolus administration of intravenous contrast. Multiplanar CT image reconstructions and MIPs were obtained to evaluate the vascular anatomy. CONTRAST:  64mL OMNIPAQUE IOHEXOL 350 MG/ML SOLN COMPARISON:  Multiple previous exams most recently 03/25/2020 FINDINGS: CT HEAD Brain: Hemorrhagic infarction in the left parietal cortical and subcortical brain redemonstrated. Low-density is slightly increased, particularly along the anterior margin. Hemorrhagic foci are stable with no new or enlarging bleeds. Mild swelling with mass-effect and 1 or 2 mm of left-to-right shift and flattening of the left lateral ventricle. Vascular: See below Skull: Negative Sinuses/Orbits: Clear/normal CTA neck: Aortic arch is normal. Right common carotid artery is widely patent to the bifurcation. Normal carotid bifurcation. Normal cervical ICA. Left common carotid artery widely patent to the bifurcation. Carotid bifurcation is normal. Normal left ICA. Both vertebral arteries widely patent at their origins and through the cervical region to the foramen magnum. CTA HEAD Anterior circulation: Both internal carotid arteries widely patent through the skull base and siphon regions. The anterior and middle cerebral vessels are  normal without proximal stenosis, aneurysm or vascular malformation. Posterior circulation: Both vertebral arteries widely patent to the basilar. No basilar stenosis. Posterior circulation branch vessels are normal. Venous sinuses: See results of CT venography. Anatomic variants: None significant. CT venography of the head Anterior aspect of the superior sagittal sinus is patent. Again demonstrated is partial thrombosis of the superior sagittal sinus at the vertex. This is not occlusive as flow can be demonstrated around the margins of the thrombus. This is fairly segmental, involving a 5-6 cm segment of the sinus. Beyond that, the distal superior sagittal sinus is patent. Both  transverse sinuses are patent. Compared to the initial presentation noncontrast CT and MRI, the thrombus does not appear progressive. Thrombosed superficial draining vein on the left again visible, similar in appearance. IMPRESSION: 1. No arterial pathology in the neck. 2. No intracranial large or medium vessel occlusion or correctable proximal stenosis. 3. Partial thrombosis of the superior sagittal sinus at the vertex, not occlusive as flow can be demonstrated around the margins of the thrombus. This is fairly segmental, involving a 5-6 cm segment of the sinus. Beyond that, the distal superior sagittal sinus is patent. Thrombosed superficial draining vein on the left again visible, similar in appearance. 4. Hemorrhagic infarction in the left parietal cortical and subcortical brain redemonstrated. Low-density edema is slightly increased, particularly along the anterior margin. No new or enlarging bleeds. Mild swelling with mass-effect and 1 or 2 mm of left-to-right shift and flattening of the left lateral ventricle. Electronically Signed   By: Nelson Chimes M.D.   On: 03/28/2020 07:15   CT VENOGRAM HEAD  Result Date: 03/28/2020 CLINICAL DATA:  Follow-up venous sinus thrombosis with left parietal venous infarction. EXAM: CT HEAD WITHOUT CONTRAST CT arteriography and venography OF THE HEAD and neck TECHNIQUE: Contiguous axial images were obtained from the base of the skull through the vertex without intravenous contrast. Multidetector CT imaging of the head was performed using the standard protocol during bolus administration of intravenous contrast. Multiplanar CT image reconstructions and MIPs were obtained to evaluate the vascular anatomy. CONTRAST:  54mL OMNIPAQUE IOHEXOL 350 MG/ML SOLN COMPARISON:  Multiple previous exams most recently 03/25/2020 FINDINGS: CT HEAD Brain: Hemorrhagic infarction in the left parietal cortical and subcortical brain redemonstrated. Low-density is slightly increased, particularly  along the anterior margin. Hemorrhagic foci are stable with no new or enlarging bleeds. Mild swelling with mass-effect and 1 or 2 mm of left-to-right shift and flattening of the left lateral ventricle. Vascular: See below Skull: Negative Sinuses/Orbits: Clear/normal CTA neck: Aortic arch is normal. Right common carotid artery is widely patent to the bifurcation. Normal carotid bifurcation. Normal cervical ICA. Left common carotid artery widely patent to the bifurcation. Carotid bifurcation is normal. Normal left ICA. Both vertebral arteries widely patent at their origins and through the cervical region to the foramen magnum. CTA HEAD Anterior circulation: Both internal carotid arteries widely patent through the skull base and siphon regions. The anterior and middle cerebral vessels are normal without proximal stenosis, aneurysm or vascular malformation. Posterior circulation: Both vertebral arteries widely patent to the basilar. No basilar stenosis. Posterior circulation branch vessels are normal. Venous sinuses: See results of CT venography. Anatomic variants: None significant. CT venography of the head Anterior aspect of the superior sagittal sinus is patent. Again demonstrated is partial thrombosis of the superior sagittal sinus at the vertex. This is not occlusive as flow can be demonstrated around the margins of the thrombus. This is fairly segmental, involving a 5-6 cm segment  of the sinus. Beyond that, the distal superior sagittal sinus is patent. Both transverse sinuses are patent. Compared to the initial presentation noncontrast CT and MRI, the thrombus does not appear progressive. Thrombosed superficial draining vein on the left again visible, similar in appearance. IMPRESSION: 1. No arterial pathology in the neck. 2. No intracranial large or medium vessel occlusion or correctable proximal stenosis. 3. Partial thrombosis of the superior sagittal sinus at the vertex, not occlusive as flow can be  demonstrated around the margins of the thrombus. This is fairly segmental, involving a 5-6 cm segment of the sinus. Beyond that, the distal superior sagittal sinus is patent. Thrombosed superficial draining vein on the left again visible, similar in appearance. 4. Hemorrhagic infarction in the left parietal cortical and subcortical brain redemonstrated. Low-density edema is slightly increased, particularly along the anterior margin. No new or enlarging bleeds. Mild swelling with mass-effect and 1 or 2 mm of left-to-right shift and flattening of the left lateral ventricle. Electronically Signed   By: Nelson Chimes M.D.   On: 03/28/2020 07:15    PHYSICAL EXAM    Temp:  [98.4 F (36.9 C)-98.9 F (37.2 C)] 98.7 F (37.1 C) (10/09 1154) Pulse Rate:  [56-63] 63 (10/09 1154) Resp:  [17-18] 18 (10/09 1154) BP: (99-116)/(51-68) 111/64 (10/09 1154) SpO2:  [99 %-100 %] 100 % (10/09 1154)  General - Well nourished, well developed middle-aged Hispanic lady, in no apparent distress.  Ophthalmologic - fundi not visualized due to noncooperation.  Cardiovascular - Regular rhythm and rate.  Mental Status -  Level of arousal and orientation to time, place, and person were intact. Language including expression, naming, repetition, comprehension was assessed and found intact. Fund of Knowledge was assessed and was intact.  Cranial Nerves II - XII - II - Visual field shows mild right hemianopsia.   III, IV, VI - Extraocular movements intact. V - Facial sensation intact bilaterally. VII - Facial movement intact bilaterally. VIII - Hearing & vestibular intact bilaterally. X - Palate elevates symmetrically. XI - Chin turning & shoulder shrug intact bilaterally. XII - Tongue protrusion intact.  Motor Strength - The patient's strength was normal in all extremities and pronator drift was absent.  Bulk was normal and fasciculations were absent.   Motor Tone - Muscle tone was assessed at the neck and appendages  and was normal.  Reflexes - The patient's reflexes were symmetrical in all extremities and she had no pathological reflexes.  Sensory - Light touch, temperature/pinprick were assessed and were symmetrical.    Coordination - The patient had normal movements in the hands with no ataxia or dysmetria.  Tremor was absent.  Gait and Station - deferred.   ASSESSMENT/PLAN Ms. Vanda I Azizi is a 44 y.o. female with no PMH presenting ro [facility] with HA and neck stiffness.   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 Also thrombosis draining cortical vein. 2 areas enhancement in L occipital hemorrhage possible ongoing hemorrhage. Similar edema w/ slight R shift.  CT head 10/6 2028 venous infarct w/ hemorrhagic transformation L parietal slight increase edema, most hemorrhage foci unchanged. Single new hemorrhage at vertex. Hyperdense sagittal sinus (thrombosis).  CT head 10/6 2251 stable  Pan CT neg for malignancy, B renal vein  nonocclusive thrombosis.  CT Venogram and CTA H&N - No arterial pathology in the neck. No intracranial large or medium vessel occlusion or correctable proximal stenosis. Partial thrombosis of the superior sagittal sinus at the vertex, not occlusive as flow can be demonstrated around the margins of the thrombus. This is fairly segmental, involving a 5-6 cm segment of the sinus. Beyond that, the distal superior sagittal sinus is patent. Thrombosed superficial draining vein on the left again visible, similar in appearance. Hemorrhagic infarction in the left parietal cortical and subcortical brain redemonstrated. Low-density edema is slightly increased, particularly along  the anterior margin. No new or enlarging bleeds. Mild swelling with mass-effect and 1 or 2 mm of left-to-right shift and flattening of the left lateral ventricle.  2D Echo EF 60-65%. No source of embolus   LDL UTC, TC 210, TG 132, direct LDL 151.9  HgbA1c 5.6  UDS +benzos  Hypercoagulable labs pending  (prot S total and activity mildly low - not accurate on heparin)  VTE prophylaxis - heparin IV and SCDs   No antithrombotic prior to admission, now on heparin IV.   Therapy recommendations:  CIR  Disposition:  pending   Seizure   GTC seizure in ER   S/p ativan and keppra load  Continue keppra 500 bid  LTM EEG - L frontotemporal continuous slowing, B parieto-occipital spikes   D/c LTM EEG  Seizure precautions  ?? Hypercoagulable state  Cerebral central venous thrombosis  Bilateral renal vein nonocclusive thrombosis  OCP use  Hypercoagulable work-up pending  (prot S total and activity mildly low - but on heparin IV so not accurate)  On heparin IV  OCP use  On OTC 28 for contraception   Not smoker  Recommend to avoid OCP use, especially estrogen use  Hyperlipidemia  LDL UTC, TC 210, TG 132, direct LDL 151.9  on no statin PTA  Add lipitor 40  Continue lipitor on discharge   Other Stroke Risk Factors  Hx ETOH use  Overweight, Body mass index is 28.27 kg/m., recommend weight loss, diet and exercise as appropriate   Other Active Problems  Anemia, Hgb 9.2->8.6->7.8->7.9->7.6 - monitor - starting warfarin.   Leukocytosis WBC 9.9->13.9->8.9 UA neg -> 8.0  Hypokalemia, K 3.2->3.9->3.7->3.3 supplement   Hospital day # 3 Continue IV heparin and start warfarin today.  Patient has no insurance will not be able to afford Pradaxa or Eliquis.  She will need at least to 3 days of overlap of IV heparin and warfarin before she can be discharged.  Pain should encouraged to keep yourself well-hydrated and drink plenty of fluids.  Continue Keppra for seizures  greater than 50% time during this 35-minute visit was spent in counseling and coordination of care about venous sinus thrombosis and hypercoagulability and answering questions.  Antony Contras, MD    To contact Stroke Continuity provider, please refer to [email or web address] After hours, contact General Neurology

## 2020-03-28 NOTE — Progress Notes (Signed)
Inpatient Rehab Admissions Coordinator:   I met with Pt. And her husband to discuss potential CIR admit. Pt. Would like to think about it , but would prefer to go home with home health or outpatient therapies if possible. She states she would like to see how she progresses with therapies for a few days before making a decision regarding CIR. I will follow up with her on Monday.  Clemens Catholic, [place], [place] Admissions Coordinator  [phone] ([place]) [phone] (office)

## 2020-03-28 NOTE — Plan of Care (Signed)
  Problem: Education: Goal: Knowledge of disease or condition will improve Outcome: Progressing Goal: Knowledge of secondary prevention will improve Outcome: Progressing Goal: Knowledge of patient specific risk factors addressed and post discharge goals established will improve Outcome: Progressing Goal: Individualized Educational Video(s) Outcome: Progressing   Problem: Coping: Goal: Will verbalize positive feelings about self Outcome: Progressing Goal: Will identify appropriate support needs Outcome: Progressing   Problem: Health Behavior/Discharge Planning: Goal: Ability to manage health-related needs will improve Outcome: Progressing   Problem: Self-Care: Goal: Ability to participate in self-care as condition permits will improve Outcome: Progressing Goal: Verbalization of feelings and concerns over difficulty with self-care will improve Outcome: Progressing   Problem: Nutrition: Goal: Risk of aspiration will decrease Outcome: Progressing Goal: Dietary intake will improve Outcome: Progressing   Problem: Nutrition: Goal: Risk of aspiration will decrease Outcome: Progressing Goal: Dietary intake will improve Outcome: Progressing   Problem: Intracerebral Hemorrhage Tissue Perfusion: Goal: Complications of Intracerebral Hemorrhage will be minimized Outcome: Progressing   Problem: Intracerebral Hemorrhage Tissue Perfusion: Goal: Complications of Intracerebral Hemorrhage will be minimized Outcome: Progressing

## 2020-03-28 NOTE — Progress Notes (Signed)
ANTICOAGULATION CONSULT NOTE - Follow Up Consult  Pharmacy Consult for Heparin and Coumadin Indication: Dural venous sinus thrombosis  No Known Allergies  Patient Measurements: Height: 4\' 11"  (149.9 cm) Weight: 63.5 kg (139 lb 15.9 oz) IBW/kg (Calculated) : 43.2 Heparin Dosing Weight:   Vital Signs: Temp: 98.7 F (37.1 C) (10/09 1154) Temp Source: Oral (10/09 1154) BP: 111/64 (10/09 1154) Pulse Rate: 63 (10/09 1154)  Labs: Recent Labs     0000 03/25/20 1627 03/25/20 1749 03/26/20 1010 03/26/20 1549 03/27/20 0109 03/27/20 0109 03/27/20 0704 03/27/20 0704 03/27/20 1604 03/28/20 0116 03/28/20 1102  HGB   < >  --   --  8.6*  --  7.8*   < > 7.9*  --   --  7.6*  --   HCT   < >  --   --  28.4*  --  26.0*  --  26.6*  --   --  25.8*  --   PLT  --   --   --  359  --  325  --   --   --   --  312  --   APTT  --  24  --   --   --   --   --   --   --   --   --   --   LABPROT  --   --  13.5  --   --   --   --   --   --   --   --   --   INR  --   --  1.1  --   --   --   --   --   --   --   --   --   HEPARINUNFRC  --   --   --  0.39   < > 0.29*   < > 0.29*   < > 0.38 0.62 0.58  CREATININE  --   --   --  0.61  --  0.68  --   --   --   --  0.62  --    < > = values in this interval not displayed.    Estimated Creatinine Clearance: 71.9 mL/min (by C-G formula based on SCr of 0.62 mg/dL).   Assessment: Anticoag: Heparin for dural venous sinus thrombosis, low goal and no bolus. Started at [place] level 0.58, Start Coumadin today. Hgb 7.6 low but stable.  Goal of Therapy:  Heparin level 0.3-0.5 units/ml Monitor platelets by anticoagulation protocol: Yes INR 2-3   Plan:  Dec heparin to 800 units/hr Coumadin 6mg  po x 1 tonight Daily HL, CBC, and INR Educate pt and husband.   Orbin Mayeux S. Alford Highland, PharmD, BCPS Clinical Staff Pharmacist [email or web address] Alford Highland, [employer] 03/28/2020,12:42 PM

## 2020-03-28 NOTE — Progress Notes (Signed)
ANTICOAGULATION CONSULT NOTE - Follow Up Consult  Pharmacy Consult for Heparin Indication: Dural SVT with Hemorrhagic Conversion  No Known Allergies  Patient Measurements: Height: 4\' 11"  (149.9 cm) Weight: 63.5 kg (139 lb 15.9 oz) IBW/kg (Calculated) : 43.2 Heparin Dosing Weight: 56.8 kg  Vital Signs: Temp: 98.9 F (37.2 C) (10/08 1955) Temp Source: Oral (10/08 1955) BP: 113/67 (10/08 1955) Pulse Rate: 56 (10/08 1955)  Labs: Recent Labs    03/25/20 1229 03/25/20 1627 03/25/20 1749 03/26/20 1010 03/26/20 1549 03/27/20 0109 03/27/20 0109 03/27/20 0704 03/27/20 1604 03/28/20 0116  HGB   < >  --   --  8.6*  --  7.8*   < > 7.9*  --  7.6*  HCT   < >  --   --  28.4*  --  26.0*  --  26.6*  --  25.8*  PLT   < >  --   --  359  --  325  --   --   --  312  APTT  --  24  --   --   --   --   --   --   --   --   LABPROT  --   --  13.5  --   --   --   --   --   --   --   INR  --   --  1.1  --   --   --   --   --   --   --   HEPARINUNFRC  --   --   --  0.39   < > 0.29*   < > 0.29* 0.38 0.62  CREATININE   < >  --   --  0.61  --  0.68  --   --   --  0.62   < > = values in this interval not displayed.    Estimated Creatinine Clearance: 71.9 mL/min (by C-G formula based on SCr of 0.62 mg/dL).  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  Heparin level ~6 hrs after heparin infusion was increased to 900 units/hr was 0.38 units/ml, which is within the goal range for this pt. H/H 7.9/26.6, platelets 325. Per RN, no issues with IV or bleeding observed.  10/9 AM update:  Heparin level above goal this AM No issue per RN Hgb down some-watch closely   Goal of Therapy:  Heparin level 0.3-0.5 units/ml Monitor platelets by anticoagulation protocol: Yes   Plan:  Dec heparin to 850 units/hr 1100 heparin level Monitor daily heparin level, CBC Monitor for signs and symptoms of bleeding  Narda Bonds,  PharmD, BCPS Clinical Pharmacist Phone: [phone]

## 2020-03-29 LAB — HEPARIN LEVEL (UNFRACTIONATED)
Heparin Unfractionated: 1.02 IU/mL — ABNORMAL HIGH (ref 0.30–0.70)
Heparin Unfractionated: 0.95 IU/mL — ABNORMAL HIGH (ref 0.30–0.70)
Heparin Unfractionated: 0.65 IU/mL (ref 0.30–0.70)

## 2020-03-29 LAB — BASIC METABOLIC PANEL
Anion gap: 10 (ref 5–15)
BUN: <5 mg/dL — ABNORMAL LOW (ref 6–20)
CO2: 20 mmol/L — ABNORMAL LOW (ref 22–32)
Calcium: 8.5 mg/dL — ABNORMAL LOW (ref 8.9–10.3)
Chloride: 109 mmol/L (ref 98–111)
Creatinine, Ser: 0.67 mg/dL (ref 0.44–1.00)
GFR, Estimated: >60 mL/min (ref >60)
Glucose, Bld: 90 mg/dL (ref 70–99)
Potassium: 3.6 mmol/L (ref 3.5–5.1)
Sodium: 139 mmol/L (ref 135–145)

## 2020-03-29 LAB — CBC
HCT: 27.9 % — ABNORMAL LOW (ref 36.0–46.0)
Hemoglobin: 8.2 g/dL — ABNORMAL LOW (ref 12.0–15.0)
MCH: 22 pg — ABNORMAL LOW (ref 26.0–34.0)
MCHC: 29.4 g/dL — ABNORMAL LOW (ref 30.0–36.0)
MCV: 74.8 fL — ABNORMAL LOW (ref 80.0–100.0)
Platelets: 362 10*3/uL (ref 150–400)
RBC: 3.73 MIL/uL — ABNORMAL LOW (ref 3.87–5.11)
RDW: 19 % — ABNORMAL HIGH (ref 11.5–15.5)
WBC: 7.4 10*3/uL (ref 4.0–10.5)
nRBC: 0 % (ref 0.0–0.2)

## 2020-03-29 LAB — PROTIME-INR
INR: 1.3 — ABNORMAL HIGH (ref 0.8–1.2)
Prothrombin Time: 16.1 seconds — ABNORMAL HIGH (ref 11.4–15.2)

## 2020-03-29 MED ORDER — WARFARIN SODIUM 3 MG PO TABS
3.0000 mg | ORAL_TABLET | Freq: Once | ORAL | Status: AC
  Administered 2020-03-29: 3 mg via ORAL
  Filled 2020-03-29: qty 1

## 2020-03-29 NOTE — Progress Notes (Signed)
STROKE TEAM PROGRESS NOTE   INTERVAL HISTORY Family member present and acts as interpreter.  Started warfarin per pharmacy.  INR today is 1.3.  She remains on IV heparin.    She still  had mild headache yesterday which responded to Tylenol.  Vitals:   03/28/20 1520 03/28/20 1920 03/28/20 2326 03/29/20 0342  BP: 111/60 104/71 111/63 116/65  Pulse: 65 (!) 55 63 (!) 54  Resp: 18 18 15 17   Temp: 98.8 F (37.1 C) 99.2 F (37.3 C) 98.7 F (37.1 C) 98.4 F (36.9 C)  TempSrc: Oral Oral Oral Oral  SpO2: 100% 99% 100% 100%  Weight:      Height:       CBC:  Recent Labs  Lab 03/25/20 1229 03/26/20 1010 03/28/20 0116 03/29/20 0608  WBC 9.9   < > 8.0 7.4  NEUTROABS 7.2  --   --   --   HGB 9.2*   < > 7.6* 8.2*  HCT 28.7*   < > 25.8* 27.9*  MCV 70.2*   < > 74.8* 74.8*  PLT 422*   < > 312 362   < > = values in this interval not displayed.   Basic Metabolic Panel:  Recent Labs  Lab 03/27/20 0109 03/28/20 0116  NA 137 139  K 3.7 3.3*  CL 111 110  CO2 19* 19*  GLUCOSE 96 86  BUN 6 <5*  CREATININE 0.68 0.62  CALCIUM 7.8* 7.9*   Lipid Panel:  Recent Labs  Lab 03/26/20 1010  CHOL 210*  TRIG 132  HDL 53  CHOLHDL 4.0  VLDL 26  LDLCALC NOT CALCULATED   HgbA1c:  Recent Labs  Lab 03/26/20 0353  HGBA1C 5.6   Urine Drug Screen:  Recent Labs  Lab 03/26/20 1647  LABOPIA NONE DETECTED  COCAINSCRNUR NONE DETECTED  LABBENZ POSITIVE*  AMPHETMU NONE DETECTED  THCU NONE DETECTED  LABBARB NONE DETECTED    Alcohol Level No results for input(s): ETH in the last 168 hours.  IMAGING past 24 hours No results found.  PHYSICAL EXAM    Temp:  [98.4 F (36.9 C)-99.2 F (37.3 C)] 98.4 F (36.9 C) (10/10 0342) Pulse Rate:  [54-65] 54 (10/10 0342) Resp:  [15-18] 17 (10/10 0342) BP: (104-116)/(60-71) 116/65 (10/10 0342) SpO2:  [99 %-100 %] 100 % (10/10 0342)  General - Well nourished, well developed middle-aged Hispanic lady, in no apparent distress.  Ophthalmologic - fundi  not visualized due to noncooperation.  Cardiovascular - Regular rhythm and rate.  Mental Status -  Level of arousal and orientation to time, place, and person were intact. Language including expression, naming, repetition, comprehension was assessed and found intact. Fund of Knowledge was assessed and was intact.  Cranial Nerves II - XII - II - Visual field shows mild right hemianopsia.   III, IV, VI - Extraocular movements intact. V - Facial sensation intact bilaterally. VII - Facial movement intact bilaterally. VIII - Hearing & vestibular intact bilaterally. X - Palate elevates symmetrically. XI - Chin turning & shoulder shrug intact bilaterally. XII - Tongue protrusion intact.  Motor Strength - The patient's strength was normal in all extremities and pronator drift was absent.  Bulk was normal and fasciculations were absent.   Motor Tone - Muscle tone was assessed at the neck and appendages and was normal.  Reflexes - The patient's reflexes were symmetrical in all extremities and she had no pathological reflexes.  Sensory - Light touch, temperature/pinprick were assessed and were symmetrical.  Coordination - The patient had normal movements in the hands with no ataxia or dysmetria.  Tremor was absent.  Gait and Station - deferred.   ASSESSMENT/PLAN Erica Rosario is a 44 y.o. female with no PMH presenting ro [facility] with HA and neck stiffness.   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 Also thrombosis draining cortical vein. 2 areas enhancement in L occipital hemorrhage possible ongoing hemorrhage. Similar edema w/ slight R shift.  CT head 10/6 2028 venous  infarct w/ hemorrhagic transformation L parietal slight increase edema, most hemorrhage foci unchanged. Single new hemorrhage at vertex. Hyperdense sagittal sinus (thrombosis).  CT head 10/6 2251 stable  Pan CT neg for malignancy, B renal vein nonocclusive thrombosis.  CT Venogram and CTA H&N - No arterial pathology in the neck. No intracranial large or medium vessel occlusion or correctable proximal stenosis. Partial thrombosis of the superior sagittal sinus at the vertex, not occlusive as flow can be demonstrated around the margins of the thrombus. This is fairly segmental, involving a 5-6 cm segment of the sinus. Beyond that, the distal superior sagittal sinus is patent. Thrombosed superficial draining vein on the left again visible, similar in appearance. Hemorrhagic infarction in the left parietal cortical and subcortical brain redemonstrated. Low-density edema is slightly increased, particularly along the anterior margin. No new or enlarging bleeds. Mild swelling with mass-effect and 1 or 2 mm of left-to-right shift and flattening of the left lateral ventricle.  2D Echo EF 60-65%. No source of embolus   LDL UTC, TC 210, TG 132, direct LDL 151.9  HgbA1c 5.6  UDS +benzos  Hypercoagulable labs pending  (prot S total and activity mildly low - not accurate on heparin)  VTE prophylaxis - heparin IV and SCDs   No antithrombotic prior to admission, now on heparin IV.   Therapy recommendations:  CIR  Disposition:  pending   Seizure   GTC seizure in ER   S/p ativan and keppra load  Continue keppra 500 bid  LTM EEG - L frontotemporal continuous slowing, B parieto-occipital spikes   D/c LTM EEG  Seizure precautions  ?? Hypercoagulable state  Cerebral central venous thrombosis  Bilateral renal vein nonocclusive thrombosis  OCP use  Hypercoagulable work-up pending  (prot S total and activity mildly low - but on heparin IV so not accurate)  On heparin IV  OCP use  On OTC  28 for contraception   Not smoker  Recommend to avoid OCP use, especially estrogen use  Hyperlipidemia  LDL UTC, TC 210, TG 132, direct LDL 151.9  on no statin PTA  Add lipitor 40  Continue lipitor on discharge   Other Stroke Risk Factors  Hx ETOH use  Overweight, Body mass index is 28.27 kg/m., recommend weight loss, diet and exercise as appropriate   Other Active Problems  Anemia, Hgb 9.2->8.6->7.8->7.9->7.6 - monitor - starting warfarin.   Leukocytosis WBC 9.9->13.9->8.9 UA neg -> 8.0  Hypokalemia, K 3.2->3.9->3.7->3.3 supplement   Hospital day # 4 Continue IV heparin and started warfarin y`day.  Patient has no insurance will not be able to afford Pradaxa or Eliquis.  She will need at least to 3 days of overlap of IV heparin and warfarin before she can be discharged.  Pain should encouraged to keep yourself well-hydrated and drink plenty of fluids.  Continue Keppra for seizures greater than 50% time during this 25-minute visit was spent in counseling and coordination of care about venous sinus thrombosis and hypercoagulability and answering questions.  Antony Contras, MD    To contact Stroke Continuity provider, please refer to [email or web address] After hours, contact General Neurology

## 2020-03-29 NOTE — Progress Notes (Signed)
Physical Therapy Treatment Patient Details Name: Erica Rosario MRN: [id number] DOB: 21-Jan-1976 Today's Date: 03/29/2020    History of Present Illness Erica Rosario is a 44 y.o. female no PMH, presented to [facility] this afternoon with complaints of headache and neck stiffness.Brain imaging revealed ICH which is close to the superior left parietal lobe in proximity with the venous drainage making suspicion for a venous infarct that might have bled.MRI brain confirmed the bleed and also revealed a cerebral venous thrombosis on superior sagittal sinus higher up with a thrombus also in the draining cortical vein.    PT Comments    Pt making progress towards her goals. Pt continues to display R-sided visual deficits, impacting her safety as she continues to bump her R side of the anterior RW into about half the obstacles on her R. When she does bump an obstacle she tends to move the RW slightly but not fully correct it, even when stuck on the obstacle. Pt requires cues to scan and turn her head to acknowledge the obstacles, with mod success and carryover noted this date. She also becomes dizzy, but covering the R eye and focusing on an object decreases the dizziness. She requires minA for transfers and gait due to her vision deficits and unsteadiness, placing her at risk for falls. Secondary to her young age, current functional status varying greatly from her PLOF, her making progress, and her ability to withstand inc periods of time for therapy CIR continues to be an appropriate recommendation. Will continue to follow-up with acute PT services to maximize her independence and safety with all functional mobility.  Follow Up Recommendations  CIR;Supervision/Assistance - 24 hour     Equipment Recommendations   (RW vs cane pending progress)    Recommendations for Other Services Rehab consult     Precautions / Restrictions Precautions Precautions: Fall Precaution Comments: R  sided vision deficits, Spanish interpreter needed Restrictions Weight Bearing Restrictions: No    Mobility  Bed Mobility Overal bed mobility: Needs Assistance Bed Mobility: Supine to Sit;Sit to Supine     Supine to sit: Min guard;HOB elevated Sit to supine: Min guard;HOB elevated   General bed mobility comments: Pt able to transition supine <> sit R EOB with HOB elevated with increased time.  Transfers Overall transfer level: Needs assistance Equipment used: Rolling walker (2 wheeled) Transfers: Sit to/from Stand Sit to Stand: Min assist         General transfer comment: Pt able to come to stand with slight unsteadiness noted upon coming to stand resulting in the pt requiring a brief moment prior to ambulating. MinA for safety reasons.  Ambulation/Gait Ambulation/Gait assistance: Min assist Gait Distance (Feet): 200 Feet Assistive device: Rolling walker (2 wheeled) Gait Pattern/deviations: Step-through pattern;Decreased stride length;Narrow base of support Gait velocity: reduced Gait velocity interpretation: <1.31 ft/sec, indicative of household ambulator General Gait Details: Displays some trunk sway and decreased B step length, cuing pt to correct with min momentary success. MinA to maintain balance as pt would tend to hit obstacles with the R anterior portion of the RW. Cued pt to turn head regularly to scan surroundings to avoid obstacles, with mod success and carryover. Pt hit ~1/2 of the obstacles in the hall and in her room, no injury, pt allowed to hit obstacles for learning purpose.   Stairs             Wheelchair Mobility    Modified Rankin (Stroke Patients Only) Modified Rankin (Stroke Patients Only)  Pre-Morbid Rankin Score: No symptoms Modified Rankin: Moderately severe disability     Balance Overall balance assessment: Needs assistance Sitting-balance support: No upper extremity supported;Feet unsupported Sitting balance-Leahy Scale: Good Sitting  balance - Comments: Able to sit EOB statically without UE support without LOB.   Standing balance support: Bilateral upper extremity supported Standing balance-Leahy Scale: Poor Standing balance comment: Unsteadiness noted with standing mobility, requiring assistance secondary to vision deficits and dizziness.                            Cognition Arousal/Alertness: Awake/alert Behavior During Therapy: WFL for tasks assessed/performed Overall Cognitive Status: Impaired/Different from baseline Area of Impairment: Safety/judgement;Awareness;Problem solving                         Safety/Judgement: Decreased awareness of safety;Decreased awareness of deficits Awareness: Emergent Problem Solving: Requires verbal cues General Comments: Pt required multiple cues to turn head to scan for objects to her R due to her decreased vision impacting her safety.      Exercises      General Comments General comments (skin integrity, edema, etc.): Utilized translator: [name] [phone]      Pertinent Vitals/Pain Pain Assessment: No/denies pain    Home Living                      Prior Function            PT Goals (current goals can now be found in the care plan section) Acute Rehab PT Goals Patient Stated Goal: to get better and go home PT Goal Formulation: With patient/family Time For Goal Achievement: 04/10/20 Potential to Achieve Goals: Good Progress towards PT goals: Progressing toward goals    Frequency    Min 4X/week      PT Plan Current plan remains appropriate    Co-evaluation     PT goals addressed during session: Mobility/safety with mobility;Proper use of DME        AM-PAC PT "6 Clicks" Mobility   Outcome Measure  Help needed turning from your back to your side while in a flat bed without using bedrails?: None Help needed moving from lying on your back to sitting on the side of a flat bed without using bedrails?: None Help needed  moving to and from a bed to a chair (including a wheelchair)?: A Little Help needed standing up from a chair using your arms (e.g., wheelchair or bedside chair)?: A Little Help needed to walk in hospital room?: A Little Help needed climbing 3-5 steps with a railing? : A Lot 6 Click Score: 19    End of Session Equipment Utilized During Treatment: Gait belt Activity Tolerance: Patient tolerated treatment well Patient left: in bed;with call bell/phone within reach;with family/visitor present (niece present during session)   PT Visit Diagnosis: Unsteadiness on feet (R26.81);Other abnormalities of gait and mobility (R26.89);Muscle weakness (generalized) (M62.81);Difficulty in walking, not elsewhere classified (R26.2);Other symptoms and signs involving the nervous system (R29.898);Hemiplegia and hemiparesis Hemiplegia - Right/Left: Right Hemiplegia - caused by: Other Nontraumatic intracranial hemorrhage     Time: 2703-5009 PT Time Calculation (min) (ACUTE ONLY): 17 min  Charges:  $Gait Training: 8-22 mins                     Moishe Spice, PT, DPT Acute Rehabilitation Services  Pager: [phone] Office: [place] 03/29/2020, 6:07 PM

## 2020-03-29 NOTE — Plan of Care (Signed)
  Problem: Education: Goal: Knowledge of disease or condition will improve Outcome: Progressing Goal: Knowledge of secondary prevention will improve Outcome: Progressing Goal: Knowledge of patient specific risk factors addressed and post discharge goals established will improve Outcome: Progressing Goal: Individualized Educational Video(s) Outcome: Progressing   Problem: Coping: Goal: Will verbalize positive feelings about self Outcome: Progressing Goal: Will identify appropriate support needs Outcome: Progressing   Problem: Health Behavior/Discharge Planning: Goal: Ability to manage health-related needs will improve Outcome: Progressing   Problem: Self-Care: Goal: Ability to participate in self-care as condition permits will improve Outcome: Progressing Goal: Verbalization of feelings and concerns over difficulty with self-care will improve Outcome: Progressing   Problem: Nutrition: Goal: Risk of aspiration will decrease Outcome: Progressing Goal: Dietary intake will improve Outcome: Progressing   Problem: Intracerebral Hemorrhage Tissue Perfusion: Goal: Complications of Intracerebral Hemorrhage will be minimized Outcome: Progressing   Problem: Education: Goal: Knowledge of General Education information will improve Description: Including pain rating scale, medication(s)/side effects and non-pharmacologic comfort measures Outcome: Progressing   Problem: Health Behavior/Discharge Planning: Goal: Ability to manage health-related needs will improve Outcome: Progressing   Problem: Clinical Measurements: Goal: Ability to maintain clinical measurements within normal limits will improve Outcome: Progressing Goal: Will remain free from infection Outcome: Progressing Goal: Diagnostic test results will improve Outcome: Progressing Goal: Respiratory complications will improve Outcome: Progressing Goal: Cardiovascular complication will be avoided Outcome: Progressing    Problem: Activity: Goal: Risk for activity intolerance will decrease Outcome: Progressing   Problem: Nutrition: Goal: Adequate nutrition will be maintained Outcome: Progressing   Problem: Coping: Goal: Level of anxiety will decrease Outcome: Progressing   Problem: Elimination: Goal: Will not experience complications related to bowel motility Outcome: Progressing Goal: Will not experience complications related to urinary retention Outcome: Progressing   Problem: Pain Managment: Goal: General experience of comfort will improve Outcome: Progressing   Problem: Safety: Goal: Ability to remain free from injury will improve Outcome: Progressing   Problem: Skin Integrity: Goal: Risk for impaired skin integrity will decrease Outcome: Progressing   Problem: Education: Goal: Expressions of having a comfortable level of knowledge regarding the disease process will increase Outcome: Progressing   Problem: Coping: Goal: Ability to adjust to condition or change in health will improve Outcome: Progressing Goal: Ability to identify appropriate support needs will improve Outcome: Progressing   Problem: Health Behavior/Discharge Planning: Goal: Compliance with prescribed medication regimen will improve Outcome: Progressing   Problem: Medication: Goal: Risk for medication side effects will decrease Outcome: Progressing   Problem: Clinical Measurements: Goal: Complications related to the disease process, condition or treatment will be avoided or minimized Outcome: Progressing Goal: Diagnostic test results will improve Outcome: Progressing   Problem: Self-Concept: Goal: Level of anxiety will decrease Outcome: Progressing Goal: Ability to verbalize feelings about condition will improve Outcome: Progressing

## 2020-03-29 NOTE — Plan of Care (Signed)
  Problem: Education: Goal: Knowledge of disease or condition will improve Outcome: Progressing Goal: Knowledge of secondary prevention will improve Outcome: Progressing Goal: Knowledge of patient specific risk factors addressed and post discharge goals established will improve Outcome: Progressing Goal: Individualized Educational Video(s) Outcome: Progressing   Problem: Coping: Goal: Will verbalize positive feelings about self Outcome: Progressing Goal: Will identify appropriate support needs Outcome: Progressing   Problem: Health Behavior/Discharge Planning: Goal: Ability to manage health-related needs will improve Outcome: Progressing   Problem: Self-Care: Goal: Ability to participate in self-care as condition permits will improve Outcome: Progressing Goal: Verbalization of feelings and concerns over difficulty with self-care will improve Outcome: Progressing   Problem: Nutrition: Goal: Risk of aspiration will decrease Outcome: Progressing Goal: Dietary intake will improve Outcome: Progressing   Problem: Intracerebral Hemorrhage Tissue Perfusion: Goal: Complications of Intracerebral Hemorrhage will be minimized Outcome: Progressing   Problem: Education: Goal: Knowledge of General Education information will improve Description: Including pain rating scale, medication(s)/side effects and non-pharmacologic comfort measures Outcome: Progressing   Problem: Health Behavior/Discharge Planning: Goal: Ability to manage health-related needs will improve Outcome: Progressing   Problem: Clinical Measurements: Goal: Ability to maintain clinical measurements within normal limits will improve Outcome: Progressing Goal: Will remain free from infection Outcome: Progressing Goal: Diagnostic test results will improve Outcome: Progressing Goal: Respiratory complications will improve Outcome: Progressing Goal: Cardiovascular complication will be avoided Outcome: Progressing    Problem: Activity: Goal: Risk for activity intolerance will decrease Outcome: Progressing   Problem: Nutrition: Goal: Adequate nutrition will be maintained Outcome: Progressing   Problem: Coping: Goal: Level of anxiety will decrease Outcome: Progressing   Problem: Elimination: Goal: Will not experience complications related to bowel motility Outcome: Progressing Goal: Will not experience complications related to urinary retention Outcome: Progressing   Problem: Safety: Goal: Ability to remain free from injury will improve Outcome: Progressing   Problem: Skin Integrity: Goal: Risk for impaired skin integrity will decrease Outcome: Progressing   Problem: Education: Goal: Expressions of having a comfortable level of knowledge regarding the disease process will increase Outcome: Progressing   Problem: Coping: Goal: Ability to adjust to condition or change in health will improve Outcome: Progressing Goal: Ability to identify appropriate support needs will improve Outcome: Progressing   Problem: Health Behavior/Discharge Planning: Goal: Compliance with prescribed medication regimen will improve Outcome: Progressing   Problem: Medication: Goal: Risk for medication side effects will decrease Outcome: Progressing   Problem: Clinical Measurements: Goal: Complications related to the disease process, condition or treatment will be avoided or minimized Outcome: Progressing Goal: Diagnostic test results will improve Outcome: Progressing   Problem: Safety: Goal: Verbalization of understanding the information provided will improve Outcome: Progressing   Problem: Self-Concept: Goal: Level of anxiety will decrease Outcome: Progressing Goal: Ability to verbalize feelings about condition will improve Outcome: Progressing

## 2020-03-29 NOTE — Progress Notes (Signed)
ANTICOAGULATION CONSULT NOTE - Follow Up Consult  Pharmacy Consult for Heparin and Coumadin Indication: Dural venous sinus thrombosis  No Known Allergies  Patient Measurements: Height: 4\' 11"  (149.9 cm) Weight: 63.5 kg (139 lb 15.9 oz) IBW/kg (Calculated) : 43.2 Heparin Dosing Weight:   Vital Signs: Temp: 98.3 F (36.8 C) (10/10 0812) Temp Source: Oral (10/10 0812) BP: 114/61 (10/10 0812) Pulse Rate: 58 (10/10 0812)  Labs: Recent Labs    03/27/20 0109 03/27/20 0109 03/27/20 0704 03/27/20 1604 03/28/20 0116 03/28/20 0116 03/28/20 1102 03/29/20 0608 03/29/20 0818  HGB 7.8*   < > 7.9*  --  7.6*  --   --  8.2*  --   HCT 26.0*   < > 26.6*  --  25.8*  --   --  27.9*  --   PLT 325  --   --   --  312  --   --  362  --   LABPROT  --   --   --   --   --   --   --  16.1*  --   INR  --   --   --   --   --   --   --  1.3*  --   HEPARINUNFRC 0.29*   < > 0.29*   < > 0.62   < > 0.58 1.02* 0.95*  CREATININE 0.68  --   --   --  0.62  --   --  0.67  --    < > = values in this interval not displayed.    Estimated Creatinine Clearance: 71.9 mL/min (by C-G formula based on SCr of 0.67 mg/dL).   Assessment:  Anticoag: Heparin for venous sinus thrombosis with hemorrhagic transformation + Bilateral renal vein nonocclusive thrombosis, low goal and no bolus. Started at [facility] - Hep level 1.02 with repeat 0.95 unexpectedly high, INR 1.1>1.3, Hgb 8.2  Goal of Therapy:  Heparin level 0.3-0.5 units/ml Monitor platelets by anticoagulation protocol: Yes INR 2-3   Plan:  Dec heparin to 650 units/hr Recheck in 6 hrs. Coumadin 3mg  po x 1 tonight Daily HL, CBC, and INR   Lamarius Dirr S. Alford Highland, PharmD, BCPS Clinical Staff Pharmacist [email or web address] Alford Highland, [employer] 03/29/2020,8:55 AM

## 2020-03-29 NOTE — Progress Notes (Addendum)
ANTICOAGULATION CONSULT NOTE - Follow Up Consult  Pharmacy Consult for Heparin and Coumadin Indication: Dural venous sinus thrombosis  No Known Allergies  Patient Measurements: Height: 4\' 11"  (149.9 cm) Weight: 63.5 kg (139 lb 15.9 oz) IBW/kg (Calculated) : 43.2 Heparin Dosing Weight:   Vital Signs: Temp: 98.4 F (36.9 C) (10/10 1152) Temp Source: Oral (10/10 1152) BP: 116/63 (10/10 1152) Pulse Rate: 58 (10/10 1152)  Labs: Recent Labs    03/27/20 0109 03/27/20 0109 03/27/20 0704 03/27/20 1604 03/28/20 0116 03/28/20 1102 03/29/20 0608 03/29/20 0818 03/29/20 1428  HGB 7.8*   < > 7.9*  --  7.6*  --  8.2*  --   --   HCT 26.0*   < > 26.6*  --  25.8*  --  27.9*  --   --   PLT 325  --   --   --  312  --  362  --   --   LABPROT  --   --   --   --   --   --  16.1*  --   --   INR  --   --   --   --   --   --  1.3*  --   --   HEPARINUNFRC 0.29*   < > 0.29*   < > 0.62   < > 1.02* 0.95* 0.65  CREATININE 0.68  --   --   --  0.62  --  0.67  --   --    < > = values in this interval not displayed.    Estimated Creatinine Clearance: 71.9 mL/min (by C-G formula based on SCr of 0.67 mg/dL).   Assessment:  Anticoag: Heparin for venous sinus thrombosis with hemorrhagic transformation + Bilateral renal vein nonocclusive thrombosis, low goal and no bolus. Started at [facility] level= 0.65 on 650 units/hr  Goal of Therapy:  Heparin level 0.3-0.5 units/ml Monitor platelets by anticoagulation protocol: Yes INR 2-3   Plan:  Decrease heparin to 550 units/hr  Daily HL, CBC, and INR  Hildred Laser, PharmD Clinical Pharmacist **Pharmacist phone directory can now be found on [email or web address] ([email or web address]).  Listed under [place].

## 2020-03-30 DIAGNOSIS — I610 Nontraumatic intracerebral hemorrhage in hemisphere, subcortical: Secondary | ICD-10-CM

## 2020-03-30 LAB — BASIC METABOLIC PANEL
Anion gap: 11 (ref 5–15)
BUN: <5 mg/dL — ABNORMAL LOW (ref 6–20)
CO2: 19 mmol/L — ABNORMAL LOW (ref 22–32)
Calcium: 8.7 mg/dL — ABNORMAL LOW (ref 8.9–10.3)
Chloride: 109 mmol/L (ref 98–111)
Creatinine, Ser: 0.67 mg/dL (ref 0.44–1.00)
GFR, Estimated: >60 mL/min (ref >60)
Glucose, Bld: 91 mg/dL (ref 70–99)
Potassium: 4 mmol/L (ref 3.5–5.1)
Sodium: 139 mmol/L (ref 135–145)

## 2020-03-30 LAB — CBC
HCT: 27.7 % — ABNORMAL LOW (ref 36.0–46.0)
Hemoglobin: 8.2 g/dL — ABNORMAL LOW (ref 12.0–15.0)
MCH: 22 pg — ABNORMAL LOW (ref 26.0–34.0)
MCHC: 29.6 g/dL — ABNORMAL LOW (ref 30.0–36.0)
MCV: 74.5 fL — ABNORMAL LOW (ref 80.0–100.0)
Platelets: 347 K/uL (ref 150–400)
RBC: 3.72 MIL/uL — ABNORMAL LOW (ref 3.87–5.11)
RDW: 18.8 % — ABNORMAL HIGH (ref 11.5–15.5)
WBC: 7.1 K/uL (ref 4.0–10.5)
nRBC: 0 % (ref 0.0–0.2)

## 2020-03-30 LAB — FACTOR 5 LEIDEN

## 2020-03-30 LAB — PROTIME-INR
INR: 1.7 — ABNORMAL HIGH (ref 0.8–1.2)
Prothrombin Time: 19.1 seconds — ABNORMAL HIGH (ref 11.4–15.2)

## 2020-03-30 LAB — HEPARIN LEVEL (UNFRACTIONATED)
Heparin Unfractionated: 0.3 [IU]/mL (ref 0.30–0.70)
Heparin Unfractionated: 0.27 IU/mL — ABNORMAL LOW (ref 0.30–0.70)

## 2020-03-30 MED ORDER — WARFARIN SODIUM 2 MG PO TABS
2.0000 mg | ORAL_TABLET | Freq: Once | ORAL | Status: AC
  Administered 2020-03-30: 2 mg via ORAL
  Filled 2020-03-30 (×2): qty 1

## 2020-03-30 NOTE — Progress Notes (Signed)
STROKE TEAM PROGRESS NOTE   INTERVAL HISTORY Patient is sitting up in chair.  She states she is doing well.  The headache is quite minimum now.  She remains on IV heparin and warfarin and INR is up to 1.7 this morning.  She has no complaints.  Vital signs stable. Factor V Leiden is negative.  Homocystine level is normal.  Lupus anticoagulant is negative.  Prothrombin gene mutation is yet pending.  Antithrombin III is normal. Vitals:   03/29/20 1938 03/29/20 2327 03/30/20 0359 03/30/20 0834  BP: (!) 101/51 111/61 102/61 111/69  Pulse: (!) 55 61 (!) 58 61  Resp: 18 18 18 16   Temp: 98.8 F (37.1 C) 98.5 F (36.9 C) 98.5 F (36.9 C) 98.6 F (37 C)  TempSrc: Oral Oral Oral Oral  SpO2: 100%  100% 100%  Weight:      Height:       CBC:  Recent Labs  Lab 03/25/20 1229 03/26/20 1010 03/29/20 0608 03/30/20 0046  WBC 9.9   < > 7.4 7.1  NEUTROABS 7.2  --   --   --   HGB 9.2*   < > 8.2* 8.2*  HCT 28.7*   < > 27.9* 27.7*  MCV 70.2*   < > 74.8* 74.5*  PLT 422*   < > 362 347   < > = values in this interval not displayed.   Basic Metabolic Panel:  Recent Labs  Lab 03/29/20 0608 03/30/20 0046  NA 139 139  K 3.6 4.0  CL 109 109  CO2 20* 19*  GLUCOSE 90 91  BUN <5* <5*  CREATININE 0.67 0.67  CALCIUM 8.5* 8.7*   IMAGING past 24 hours No results found.  PHYSICAL EXAM     Temp:  [98.4 F (36.9 C)-98.8 F (37.1 C)] 98.6 F (37 C) (10/11 0834) Pulse Rate:  [55-64] 61 (10/11 0834) Resp:  [16-18] 16 (10/11 0834) BP: (101-116)/(51-69) 111/69 (10/11 0834) SpO2:  [98 %-100 %] 100 % (10/11 0834)  General - Well nourished, well developed middle-aged Hispanic lady, in no apparent distress.  Ophthalmologic - fundi not visualized due to noncooperation.  Cardiovascular - Regular rhythm and rate.  Mental Status -  Level of arousal and orientation to time, place, and person were intact. Language including expression, naming, repetition, comprehension was assessed and found  intact. Fund of Knowledge was assessed and was intact.  Cranial Nerves II - XII - II - Visual field shows mild right hemianopsia.   III, IV, VI - Extraocular movements intact. V - Facial sensation intact bilaterally. VII - Facial movement intact bilaterally. VIII - Hearing & vestibular intact bilaterally. X - Palate elevates symmetrically. XI - Chin turning & shoulder shrug intact bilaterally. XII - Tongue protrusion intact.  Motor Strength - The patient's strength was normal in all extremities and pronator drift was absent.  Bulk was normal and fasciculations were absent.   Motor Tone - Muscle tone was assessed at the neck and appendages and was normal.  Reflexes - The patient's reflexes were symmetrical in all extremities and she had no pathological reflexes.  Sensory - Light touch, temperature/pinprick were assessed and were symmetrical.    Coordination - The patient had normal movements in the hands with no ataxia or dysmetria.  Tremor was absent.  Gait and Station - deferred.   ASSESSMENT/PLAN [name] is a 44 y.o. female with no PMH presenting ro [facility] with HA and neck stiffness.   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 Also thrombosis draining cortical vein. 2 areas enhancement in L occipital hemorrhage possible ongoing hemorrhage. Similar edema w/ slight R shift.  CT head 10/6 2028 venous infarct w/ hemorrhagic transformation L parietal slight increase edema, most hemorrhage foci unchanged. Single new hemorrhage at vertex. Hyperdense sagittal sinus (thrombosis).  CT head 10/6 2251 stable  Pan CT neg for malignancy, B renal vein nonocclusive  thrombosis.  CT Venogram and CTA H&N - No arterial pathology in the neck. No intracranial large or medium vessel occlusion or correctable proximal stenosis. Partial thrombosis of the superior sagittal sinus at the vertex, not occlusive as flow can be demonstrated around the margins of the thrombus. This is fairly segmental, involving a 5-6 cm segment of the sinus. Beyond that, the distal superior sagittal sinus is patent. Thrombosed superficial draining vein on the left again visible, similar in appearance. Hemorrhagic infarction in the left parietal cortical and subcortical brain redemonstrated. Low-density edema is slightly increased, particularly along the anterior margin. No new or enlarging bleeds. Mild swelling with mass-effect and 1 or 2 mm of left-to-right shift and flattening of the left lateral ventricle.  2D Echo EF 60-65%. No source of embolus   LDL UTC, TC 210, TG 132, direct LDL 151.9  HgbA1c 5.6  UDS +benzos  Hypercoagulable labs  Anticardiolipin IgM 19 (indeterminate) (prot S total and activity mildly low - not accurate on heparin)  VTE prophylaxis - heparin IV   No antithrombotic prior to admission, now on heparin IV and warfarin daily. INR 1.7.   Therapy recommendations:  CIR  Disposition:  pending   Medically ready for d/c when CIR bed available  Seizure   GTC seizure in ER   S/p ativan and keppra load  Continue keppra 500 bid  LTM EEG - L frontotemporal continuous slowing, B parieto-occipital spikes   D/c LTM EEG  Seizure precautions  ?? Hypercoagulable state  Cerebral central venous thrombosis  Bilateral renal vein nonocclusive thrombosis  OCP use  Hypercoagulable labs  Anticardiolipin IgM 19 (indeterminate) (prot S total and activity mildly low - not accurate on heparin)  On heparin IV  INR 1.7  OCP use  On OTC 28 for contraception   Not smoker  Recommend to avoid OCP use, especially estrogen use  Hyperlipidemia  LDL UTC, TC 210, TG  132, direct LDL 151.9  on no statin PTA  Add lipitor 40  Continue lipitor on discharge   Other Stroke Risk Factors  Hx ETOH use  Overweight, Body mass index is 28.27 kg/m., recommend weight loss, diet and exercise as appropriate   Other Active Problems  Anemia, Hgb 9.2->8.6->7.8->7.9->7.6->8.2->8.2 - monitor - starting warfarin.   Leukocytosis WBC 9.9->13.9->8.9 UA neg -> 8.0->7.4->7.1 - resolved  Hypokalemia, K 3.2->3.9->3.7->3.3->3.6->4.0 - resolved  Hospital day # 5  Continue IV heparin bridge until INR is optimal and near 2.  Likely discharge home tomorrow if INR is optimal.  Patient encouraged to keep yourself well-hydrated.  Antony Contras, MD    To contact Stroke Continuity provider, please refer to [email or web address] After hours, contact General Neurology

## 2020-03-30 NOTE — Progress Notes (Signed)
ANTICOAGULATION CONSULT NOTE - Follow Up Consult  Pharmacy Consult for Heparin and Coumadin Indication: Dural venous sinus thrombosis  No Known Allergies  Patient Measurements: Height: 4\' 11"  (149.9 cm) Weight: 63.5 kg (139 lb 15.9 oz) IBW/kg (Calculated) : 43.2 Heparin Dosing Weight:   Vital Signs: Temp: 98.5 F (36.9 C) (10/11 0359) Temp Source: Oral (10/11 0359) BP: 102/61 (10/11 0359) Pulse Rate: 58 (10/11 0359)  Labs: Recent Labs    03/28/20 0116 03/28/20 1102 03/29/20 0608 03/29/20 0608 03/29/20 0818 03/29/20 1428 03/30/20 0046  HGB 7.6*  --  8.2*  --   --   --  8.2*  HCT 25.8*  --  27.9*  --   --   --  27.7*  PLT 312  --  362  --   --   --  347  LABPROT  --   --  16.1*  --   --   --  19.1*  INR  --   --  1.3*  --   --   --  1.7*  HEPARINUNFRC 0.62   < > 1.02*   < > 0.95* 0.65 0.30  CREATININE 0.62  --  0.67  --   --   --  0.67   < > = values in this interval not displayed.    Estimated Creatinine Clearance: 71.9 mL/min (by C-G formula based on SCr of 0.67 mg/dL).   Assessment:  Anticoag: Heparin for venous sinus thrombosis with hemorrhagic transformation + Bilateral renal vein nonocclusive thrombosis, low goal and no bolus. Started at [facility] - Hep level 0.3 (lowest end of range), INR 1.1>1.3>1.7, Hgb 8.2  Goal of Therapy:  Heparin level 0.3-0.5 units/ml Monitor platelets by anticoagulation protocol: Yes INR 2-3   Plan:  Increase heparin to 600 units/hr Recheck in 6 hrs. Coumadin 2 mg po x 1 tonight Daily HL, CBC, and INR   Erica Rosario A. Levada Dy, PharmD, BCPS, FNKF Clinical Pharmacist [place] Please utilize Amion for appropriate phone number to reach the unit pharmacist ([place])   03/30/2020,7:27 AM

## 2020-03-30 NOTE — Progress Notes (Addendum)
ANTICOAGULATION CONSULT NOTE - Follow Up Consult  Pharmacy Consult for Heparin  Indication: Dural venous sinus thrombosis  No Known Allergies  Patient Measurements: Height: 4\' 11"  (149.9 cm) Weight: 63.5 kg (139 lb 15.9 oz) IBW/kg (Calculated) : 43.2 Heparin Dosing Weight: 56.8 kg  Vital Signs: Temp: 98.3 F (36.8 C) (10/11 1618) Temp Source: Oral (10/11 1618) BP: 113/68 (10/11 1618) Pulse Rate: 58 (10/11 1618)  Labs: Recent Labs    03/28/20 0116 03/28/20 1102 03/29/20 0608 03/29/20 0818 03/29/20 1428 03/30/20 0046 03/30/20 1524  HGB 7.6*  --  8.2*  --   --  8.2*  --   HCT 25.8*  --  27.9*  --   --  27.7*  --   PLT 312  --  362  --   --  347  --   LABPROT  --   --  16.1*  --   --  19.1*  --   INR  --   --  1.3*  --   --  1.7*  --   HEPARINUNFRC 0.62   < > 1.02*   < > 0.65 0.30 0.27*  CREATININE 0.62  --  0.67  --   --  0.67  --    < > = values in this interval not displayed.    Estimated Creatinine Clearance: 71.9 mL/min (by C-G formula based on SCr of 0.67 mg/dL).   Assessment:  Anticoag: Heparin for venous sinus thrombosis with hemorrhagic transformation + Bilateral renal vein nonocclusive thrombosis, low goal and no bolus. Started at [facility]  -This PM Heparin level is down to 0.27 - decreased below goal after slightly increased rate of 600 units/hr. CBC stable. No issues with infusion.   Goal of Therapy:  Heparin level 0.3-0.5 units/ml Monitor platelets by anticoagulation protocol: Yes INR 2-3   Plan:  Increase heparin to 650 units/hr. Daily HL, CBC, and INR  Sloan Leiter, PharmD, BCPS, BCCCP Clinical Pharmacist Please refer to [facility] for [place] numbers 03/30/2020,4:30 PM

## 2020-03-30 NOTE — Progress Notes (Signed)
Occupational Therapy Treatment Patient Details Name: Erica Rosario MRN: [id number] DOB: [birth year] Today's Date: 03/30/2020    History of present illness Erica Rosario is a 44 y.o. female no PMH, presented to [facility] this afternoon with complaints of headache and neck stiffness.Brain imaging revealed ICH which is close to the superior left parietal lobe in proximity with the venous drainage making suspicion for a venous infarct that might have bled.MRI brain confirmed the bleed and also revealed a cerebral venous thrombosis on superior sagittal sinus higher up with a thrombus also in the draining cortical vein.   OT comments  This 44 yo female admitted with above presents to acute OT with realizing that she needs to turn her head to far right so she can use her left vision or hold paper to far left when it comes to pen and paper tasks, but this is still a challenge for her. We worked on other compensation strategies as well. We also talked about safety in home as it pertains to kitchen tasks. She will continue to benefit from acute OT with follow up on CIR.  Follow Up Recommendations  CIR;Supervision/Assistance - 24 hour    Equipment Recommendations  Tub/shower seat       Precautions / Restrictions Precautions Precautions: Fall Precaution Comments: R sided vision deficits, Spanish interpreter needed Restrictions Weight Bearing Restrictions: No       Mobility Bed Mobility Overal bed mobility: Independent Bed Mobility: Supine to Sit;Sit to Supine     Supine to sit: Supervision Sit to supine: Supervision    Transfers Overall transfer level: Needs assistance Equipment used: None Transfers: Sit to/from Stand Sit to Stand: Min guard            Balance Overall balance assessment: Needs assistance Sitting-balance support: Feet supported;No upper extremity supported Sitting balance-Leahy Scale: Good Sitting balance - Comments: Able to sit EOB  statically without UE support without LOB.                        Vision Baseline Vision/History: Wears glasses Wears Glasses: Reading only Vision Assessment?: Yes Eye Alignment: Within Functional Limits Ocular Range of Motion: Within Functional Limits Tracking/Visual Pursuits: Able to track stimulus in all quads without difficulty Visual Fields: Right homonymous hemianopsia          Cognition Arousal/Alertness: Awake/alert Behavior During Therapy: WFL for tasks assessed/performed Overall Cognitive Status: Impaired/Different from baseline Area of Impairment: Safety/judgement;Awareness                         Safety/Judgement: Decreased awareness of safety Awareness: Emergent Problem Solving: Requires verbal cues General Comments: Pt working on strategies for visual compensation        Exercises Other Exercises Other Exercises: Had pt work on pen/paper tasks (line bi-section, tracing objects, reading, letter scanning, finding letters called out) and also finding items in her room. Pt turns her head to far right to look at paper tasks with left vision only--this does pretty good for reading but not for tracing shapes. We talked about and I had her practice using her finger to follow along as she read as well as perhaps using a piece of paper to shift down the page as she read line by line. We also talked about safety in the kitchen as it pertains to sharp knifes and using the stove top.      General Comments Utilized translator: [place] via [employer]  Pertinent Vitals/ Pain       Pain Assessment: No/denies pain Pain Score: 9  Pain Location: head Pain Descriptors / Indicators: Discomfort;Grimacing Pain Intervention(s): Limited activity within patient's tolerance;Monitored during session;Patient requesting pain meds-RN notified         Frequency  Min 2X/week        Progress Toward Goals  OT Goals(current goals can now be found in the care plan  section)  Progress towards OT goals: Progressing toward goals  Acute Rehab OT Goals Patient Stated Goal: to get better and go home OT Goal Formulation: With patient/family Time For Goal Achievement: 04/10/20 Potential to Achieve Goals: Good  Plan Discharge plan remains appropriate       AM-PAC OT "6 Clicks" Daily Activity     Outcome Measure   Help from another person eating meals?: None Help from another person taking care of personal grooming?: A Little Help from another person toileting, which includes using toliet, bedpan, or urinal?: A Little Help from another person bathing (including washing, rinsing, drying)?: A Little Help from another person to put on and taking off regular upper body clothing?: A Little Help from another person to put on and taking off regular lower body clothing?: A Little 6 Click Score: 19    End of Session    OT Visit Diagnosis: Unsteadiness on feet (R26.81);Other abnormalities of gait and mobility (R26.89);Low vision, both eyes (H54.2)   Activity Tolerance Patient tolerated treatment well   Patient Left in bed;with call bell/phone within reach;with family/visitor present           Time: 1354-1416 OT Time Calculation (min): 22 min  Charges: OT General Charges $OT Visit: 1 Visit OT Treatments $Therapeutic Activity: 8-22 mins  Erica Rosario, OTR/L Acute [employer] Pager [phone] Office [phone]      Erica Rosario 03/30/2020, 6:06 PM

## 2020-03-30 NOTE — Progress Notes (Signed)
Inpatient Rehab Admissions Coordinator:   I met with patient at bedside and discussed potential CIR admission Financial information was reviewed. She states that she would like to come to CIR. Will pursue for potential admit this week, pending bed availability.   Clemens Catholic, [place], [place] Admissions Coordinator  [phone] ([place]) [phone] (office)

## 2020-03-30 NOTE — Consult Note (Signed)
Physical Medicine and Rehabilitation Consult Reason for Consult: Decreased functional mobility with headache Referring Physician: Dr. Leonie Man   HPI: Erica Rosario is a 44 y.o. right-handed female with unremarkable past medical history on no prescription medications.  Per chart review patient lives with spouse independent prior to admission.  Multilevel home.  Presented to [facility] 03/25/2020 with headache and neck stiffness as well as blurred vision.  Patient has been vaccinated against Covid 06 September 2019.  There was a reported seizure in the ED.  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  There is a thrombosis of a draining cortical vein in the region of hemorrhage.  2 rounded areas of enhancement within the left occipital hemorrhage possibly representing active ongoing hemorrhage.  CT angiogram of head and neck no arterial pathology in the neck.  No intracranial large or medium vessel occlusion.  Admission chemistries potassium 3.2, AST 48, ALT 55, hemoglobin 9.2.  EEG negative for seizure.  Echocardiogram with ejection fraction of 60 to 65% no wall motion abnormalities.  Neurology follow-up placed on Keppra for seizure prophylaxis.  Intravenous heparin initiated transitioned to Coumadin.  Therapy evaluations completed with recommendations of physical medicine rehab consult.   Review of Systems  Constitutional: Negative for chills and fever.  HENT: Negative for hearing loss.   Eyes: Positive for blurred vision.  Respiratory: Negative for cough and shortness of breath.   Cardiovascular: Negative for chest pain and palpitations.  Gastrointestinal: Positive for constipation and nausea. Negative for heartburn and vomiting.  Genitourinary: Negative for dysuria, flank pain and hematuria.  Musculoskeletal: Positive for myalgias.  Neurological: Positive for  headaches.  All other systems reviewed and are negative.  History reviewed. No pertinent past medical history. No past surgical history on file. No family history on file. Social History:  reports that she has never smoked. She has never used smokeless tobacco. She reports previous alcohol use. She reports that she does not use drugs. Allergies: No Known Allergies No medications prior to admission.    Home: Home Living Family/patient expects to be discharged to:: Private residence Living Arrangements: Spouse/significant other Available Help at Discharge: Family, Available 24 hours/day Type of Home: House Home Access: Level entry Home Layout: Multi-level (basement, does not need to access) Bathroom Shower/Tub: Tub/shower unit, Architectural technologist: Standard Home Equipment: Grab bars - tub/shower, Hand held shower head, Shower seat, Grab bars - toilet  Lives With: Spouse  Functional History: Prior Function Level of Independence: Independent Comments: works sewing Functional Status:  Mobility: Bed Mobility Overal bed mobility: Needs Assistance Bed Mobility: Supine to Sit, Sit to Supine Supine to sit: Min guard, HOB elevated Sit to supine: Min guard, HOB elevated General bed mobility comments: Pt able to transition supine <> sit R EOB with HOB elevated with increased time. Transfers Overall transfer level: Needs assistance Equipment used: Rolling walker (2 wheeled) Transfers: Sit to/from Stand Sit to Stand: Min assist Stand pivot transfers: Min assist, +2 safety/equipment General transfer comment: Pt able to come to stand with slight unsteadiness noted upon coming to stand resulting in the pt requiring a brief moment prior to ambulating. MinA for safety reasons. Ambulation/Gait Ambulation/Gait assistance: Min assist Gait Distance (Feet): 200 Feet Assistive device: Rolling walker (2 wheeled) Gait Pattern/deviations: Step-through pattern, Decreased stride length, Narrow  base of support General Gait Details: Displays some trunk sway and decreased B step length,  cuing pt to correct with min momentary success. MinA to maintain balance as pt would tend to hit obstacles with the R anterior portion of the RW. Cued pt to turn head regularly to scan surroundings to avoid obstacles, with mod success and carryover. Pt hit ~1/2 of the obstacles in the hall and in her room, no injury, pt allowed to hit obstacles for learning purpose. Gait velocity: reduced Gait velocity interpretation: <1.31 ft/sec, indicative of household ambulator    ADL: ADL Overall ADL's : Needs assistance/impaired Eating/Feeding: Supervision/ safety, Sitting Eating/Feeding Details (indicate cue type and reason): due to vision Grooming: Set up, Supervision/safety, Sitting Grooming Details (indicate cue type and reason): due to vision Upper Body Bathing: Set up, Supervision/ safety, Sitting Upper Body Bathing Details (indicate cue type and reason): due to vision Lower Body Bathing: Minimal assistance, Sit to/from stand Upper Body Dressing : Supervision/safety, Set up, Sitting Upper Body Dressing Details (indicate cue type and reason): due to vision Lower Body Dressing: Minimal assistance, Sit to/from stand Toilet Transfer: Minimal assistance, Ambulation Toilet Transfer Details (indicate cue type and reason): bed>recliner>door>back to recliner (slow pace due to decreased balance) Toileting- Clothing Manipulation and Hygiene: Minimal assistance, Sit to/from stand  Cognition: Cognition Overall Cognitive Status: Impaired/Different from baseline Arousal/Alertness: Awake/alert Orientation Level: Oriented X4 Attention: Focused, Sustained, Selective Focused Attention: Appears intact Sustained Attention: Appears intact Selective Attention: Impaired Memory: Impaired Memory Impairment: Storage deficit, Retrieval deficit Awareness: Appears intact Problem Solving: Impaired Problem Solving Impairment:  Verbal basic, Functional basic Safety/Judgment: Appears intact Cognition Arousal/Alertness: Awake/alert Behavior During Therapy: WFL for tasks assessed/performed Overall Cognitive Status: Impaired/Different from baseline Area of Impairment: Safety/judgement, Awareness, Problem solving Safety/Judgement: Decreased awareness of safety, Decreased awareness of deficits Awareness: Emergent Problem Solving: Requires verbal cues General Comments: Pt required multiple cues to turn head to scan for objects to her R due to her decreased vision impacting her safety.  Blood pressure 102/61, pulse (!) 58, temperature 98.5 F (36.9 C), temperature source Oral, resp. rate 18, height 4\' 11"  (1.499 m), weight 63.5 kg, SpO2 100 %. Physical Exam General: Alert, No apparent distress HEENT: Head is normocephalic, atraumatic, PERRLA, EOMI, sclera anicteric, oral mucosa pink and moist, dentition intact, ext ear canals clear,  Neck: Supple without JVD or lymphadenopathy Heart: Bradycardic. No murmurs rubs or gallops Chest: CTA bilaterally without wheezes, rales, or rhonchi; no distress Abdomen: Soft, non-tender, non-distended, bowel sounds positive. Extremities: No clubbing, cyanosis, or edema. Pulses are 2+ Skin: Clean and intact without signs of breakdown Neuro: Patient is a bit lethargic but arousable. Impaired memory. Makes eye contact with examiner.  Provides name and age.  Follows simple commands. 5/5 strength throughout  Psych: Pt's affect is appropriate. Pt is cooperative    Results for orders placed or performed during the hospital encounter of 03/25/20 (from the past 24 hour(s))  Basic metabolic panel     Status: Abnormal   Collection Time: 03/29/20  6:08 AM  Result Value Ref Range   Sodium 139 135 - 145 mmol/L   Potassium 3.6 3.5 - 5.1 mmol/L   Chloride 109 98 - 111 mmol/L   CO2 20 (L) 22 - 32 mmol/L   Glucose, Bld 90 70 - 99 mg/dL   BUN <5 (L) 6 - 20 mg/dL   Creatinine, Ser 0.67 0.44 - 1.00  mg/dL   Calcium 8.5 (L) 8.9 - 10.3 mg/dL   GFR, Estimated >60 >60 mL/min   Anion gap 10 5 - 15  CBC     Status: Abnormal  Collection Time: 03/29/20  6:08 AM  Result Value Ref Range   WBC 7.4 4.0 - 10.5 K/uL   RBC 3.73 (L) 3.87 - 5.11 MIL/uL   Hemoglobin 8.2 (L) 12.0 - 15.0 g/dL   HCT 27.9 (L) 36 - 46 %   MCV 74.8 (L) 80.0 - 100.0 fL   MCH 22.0 (L) 26.0 - 34.0 pg   MCHC 29.4 (L) 30.0 - 36.0 g/dL   RDW 19.0 (H) 11.5 - 15.5 %   Platelets 362 150 - 400 K/uL   nRBC 0.0 0.0 - 0.2 %  Protime-INR     Status: Abnormal   Collection Time: 03/29/20  6:08 AM  Result Value Ref Range   Prothrombin Time 16.1 (H) 11.4 - 15.2 seconds   INR 1.3 (H) 0.8 - 1.2  Heparin level (unfractionated)     Status: Abnormal   Collection Time: 03/29/20  6:08 AM  Result Value Ref Range   Heparin Unfractionated 1.02 (H) 0.30 - 0.70 IU/mL  Heparin level (unfractionated)     Status: Abnormal   Collection Time: 03/29/20  8:18 AM  Result Value Ref Range   Heparin Unfractionated 0.95 (H) 0.30 - 0.70 IU/mL  Heparin level (unfractionated)     Status: None   Collection Time: 03/29/20  2:28 PM  Result Value Ref Range   Heparin Unfractionated 0.65 0.30 - 0.70 IU/mL  Basic metabolic panel     Status: Abnormal   Collection Time: 03/30/20 12:46 AM  Result Value Ref Range   Sodium 139 135 - 145 mmol/L   Potassium 4.0 3.5 - 5.1 mmol/L   Chloride 109 98 - 111 mmol/L   CO2 19 (L) 22 - 32 mmol/L   Glucose, Bld 91 70 - 99 mg/dL   BUN <5 (L) 6 - 20 mg/dL   Creatinine, Ser 0.67 0.44 - 1.00 mg/dL   Calcium 8.7 (L) 8.9 - 10.3 mg/dL   GFR, Estimated >60 >60 mL/min   Anion gap 11 5 - 15  CBC     Status: Abnormal   Collection Time: 03/30/20 12:46 AM  Result Value Ref Range   WBC 7.1 4.0 - 10.5 K/uL   RBC 3.72 (L) 3.87 - 5.11 MIL/uL   Hemoglobin 8.2 (L) 12.0 - 15.0 g/dL   HCT 27.7 (L) 36 - 46 %   MCV 74.5 (L) 80.0 - 100.0 fL   MCH 22.0 (L) 26.0 - 34.0 pg   MCHC 29.6 (L) 30.0 - 36.0 g/dL   RDW 18.8 (H) 11.5 - 15.5 %    Platelets 347 150 - 400 K/uL   nRBC 0.0 0.0 - 0.2 %  Protime-INR     Status: Abnormal   Collection Time: 03/30/20 12:46 AM  Result Value Ref Range   Prothrombin Time 19.1 (H) 11.4 - 15.2 seconds   INR 1.7 (H) 0.8 - 1.2  Heparin level (unfractionated)     Status: None   Collection Time: 03/30/20 12:46 AM  Result Value Ref Range   Heparin Unfractionated 0.30 0.30 - 0.70 IU/mL   CT ANGIO HEAD W OR WO CONTRAST  Result Date: 03/28/2020 CLINICAL DATA:  Follow-up venous sinus thrombosis with left parietal venous infarction. EXAM: CT HEAD WITHOUT CONTRAST CT arteriography and venography OF THE HEAD and neck TECHNIQUE: Contiguous axial images were obtained from the base of the skull through the vertex without intravenous contrast. Multidetector CT imaging of the head was performed using the standard protocol during bolus administration of intravenous contrast. Multiplanar CT image reconstructions and MIPs were  obtained to evaluate the vascular anatomy. CONTRAST:  5mL OMNIPAQUE IOHEXOL 350 MG/ML SOLN COMPARISON:  Multiple previous exams most recently 03/25/2020 FINDINGS: CT HEAD Brain: Hemorrhagic infarction in the left parietal cortical and subcortical brain redemonstrated. Low-density is slightly increased, particularly along the anterior margin. Hemorrhagic foci are stable with no new or enlarging bleeds. Mild swelling with mass-effect and 1 or 2 mm of left-to-right shift and flattening of the left lateral ventricle. Vascular: See below Skull: Negative Sinuses/Orbits: Clear/normal CTA neck: Aortic arch is normal. Right common carotid artery is widely patent to the bifurcation. Normal carotid bifurcation. Normal cervical ICA. Left common carotid artery widely patent to the bifurcation. Carotid bifurcation is normal. Normal left ICA. Both vertebral arteries widely patent at their origins and through the cervical region to the foramen magnum. CTA HEAD Anterior circulation: Both internal carotid arteries widely  patent through the skull base and siphon regions. The anterior and middle cerebral vessels are normal without proximal stenosis, aneurysm or vascular malformation. Posterior circulation: Both vertebral arteries widely patent to the basilar. No basilar stenosis. Posterior circulation branch vessels are normal. Venous sinuses: See results of CT venography. Anatomic variants: None significant. CT venography of the head Anterior aspect of the superior sagittal sinus is patent. Again demonstrated is partial thrombosis of the superior sagittal sinus at the vertex. This is not occlusive as flow can be demonstrated around the margins of the thrombus. This is fairly segmental, involving a 5-6 cm segment of the sinus. Beyond that, the distal superior sagittal sinus is patent. Both transverse sinuses are patent. Compared to the initial presentation noncontrast CT and MRI, the thrombus does not appear progressive. Thrombosed superficial draining vein on the left again visible, similar in appearance. IMPRESSION: 1. No arterial pathology in the neck. 2. No intracranial large or medium vessel occlusion or correctable proximal stenosis. 3. Partial thrombosis of the superior sagittal sinus at the vertex, not occlusive as flow can be demonstrated around the margins of the thrombus. This is fairly segmental, involving a 5-6 cm segment of the sinus. Beyond that, the distal superior sagittal sinus is patent. Thrombosed superficial draining vein on the left again visible, similar in appearance. 4. Hemorrhagic infarction in the left parietal cortical and subcortical brain redemonstrated. Low-density edema is slightly increased, particularly along the anterior margin. No new or enlarging bleeds. Mild swelling with mass-effect and 1 or 2 mm of left-to-right shift and flattening of the left lateral ventricle. Electronically Signed   By: Nelson Chimes M.D.   On: 03/28/2020 07:15   CT ANGIO NECK W OR WO CONTRAST  Result Date:  03/28/2020 CLINICAL DATA:  Follow-up venous sinus thrombosis with left parietal venous infarction. EXAM: CT HEAD WITHOUT CONTRAST CT arteriography and venography OF THE HEAD and neck TECHNIQUE: Contiguous axial images were obtained from the base of the skull through the vertex without intravenous contrast. Multidetector CT imaging of the head was performed using the standard protocol during bolus administration of intravenous contrast. Multiplanar CT image reconstructions and MIPs were obtained to evaluate the vascular anatomy. CONTRAST:  67mL OMNIPAQUE IOHEXOL 350 MG/ML SOLN COMPARISON:  Multiple previous exams most recently 03/25/2020 FINDINGS: CT HEAD Brain: Hemorrhagic infarction in the left parietal cortical and subcortical brain redemonstrated. Low-density is slightly increased, particularly along the anterior margin. Hemorrhagic foci are stable with no new or enlarging bleeds. Mild swelling with mass-effect and 1 or 2 mm of left-to-right shift and flattening of the left lateral ventricle. Vascular: See below Skull: Negative Sinuses/Orbits: Clear/normal CTA neck: Aortic  arch is normal. Right common carotid artery is widely patent to the bifurcation. Normal carotid bifurcation. Normal cervical ICA. Left common carotid artery widely patent to the bifurcation. Carotid bifurcation is normal. Normal left ICA. Both vertebral arteries widely patent at their origins and through the cervical region to the foramen magnum. CTA HEAD Anterior circulation: Both internal carotid arteries widely patent through the skull base and siphon regions. The anterior and middle cerebral vessels are normal without proximal stenosis, aneurysm or vascular malformation. Posterior circulation: Both vertebral arteries widely patent to the basilar. No basilar stenosis. Posterior circulation branch vessels are normal. Venous sinuses: See results of CT venography. Anatomic variants: None significant. CT venography of the head Anterior aspect of  the superior sagittal sinus is patent. Again demonstrated is partial thrombosis of the superior sagittal sinus at the vertex. This is not occlusive as flow can be demonstrated around the margins of the thrombus. This is fairly segmental, involving a 5-6 cm segment of the sinus. Beyond that, the distal superior sagittal sinus is patent. Both transverse sinuses are patent. Compared to the initial presentation noncontrast CT and MRI, the thrombus does not appear progressive. Thrombosed superficial draining vein on the left again visible, similar in appearance. IMPRESSION: 1. No arterial pathology in the neck. 2. No intracranial large or medium vessel occlusion or correctable proximal stenosis. 3. Partial thrombosis of the superior sagittal sinus at the vertex, not occlusive as flow can be demonstrated around the margins of the thrombus. This is fairly segmental, involving a 5-6 cm segment of the sinus. Beyond that, the distal superior sagittal sinus is patent. Thrombosed superficial draining vein on the left again visible, similar in appearance. 4. Hemorrhagic infarction in the left parietal cortical and subcortical brain redemonstrated. Low-density edema is slightly increased, particularly along the anterior margin. No new or enlarging bleeds. Mild swelling with mass-effect and 1 or 2 mm of left-to-right shift and flattening of the left lateral ventricle. Electronically Signed   By: Nelson Chimes M.D.   On: 03/28/2020 07:15   CT VENOGRAM HEAD  Result Date: 03/28/2020 CLINICAL DATA:  Follow-up venous sinus thrombosis with left parietal venous infarction. EXAM: CT HEAD WITHOUT CONTRAST CT arteriography and venography OF THE HEAD and neck TECHNIQUE: Contiguous axial images were obtained from the base of the skull through the vertex without intravenous contrast. Multidetector CT imaging of the head was performed using the standard protocol during bolus administration of intravenous contrast. Multiplanar CT image  reconstructions and MIPs were obtained to evaluate the vascular anatomy. CONTRAST:  46mL OMNIPAQUE IOHEXOL 350 MG/ML SOLN COMPARISON:  Multiple previous exams most recently 03/25/2020 FINDINGS: CT HEAD Brain: Hemorrhagic infarction in the left parietal cortical and subcortical brain redemonstrated. Low-density is slightly increased, particularly along the anterior margin. Hemorrhagic foci are stable with no new or enlarging bleeds. Mild swelling with mass-effect and 1 or 2 mm of left-to-right shift and flattening of the left lateral ventricle. Vascular: See below Skull: Negative Sinuses/Orbits: Clear/normal CTA neck: Aortic arch is normal. Right common carotid artery is widely patent to the bifurcation. Normal carotid bifurcation. Normal cervical ICA. Left common carotid artery widely patent to the bifurcation. Carotid bifurcation is normal. Normal left ICA. Both vertebral arteries widely patent at their origins and through the cervical region to the foramen magnum. CTA HEAD Anterior circulation: Both internal carotid arteries widely patent through the skull base and siphon regions. The anterior and middle cerebral vessels are normal without proximal stenosis, aneurysm or vascular malformation. Posterior circulation: Both vertebral arteries  widely patent to the basilar. No basilar stenosis. Posterior circulation branch vessels are normal. Venous sinuses: See results of CT venography. Anatomic variants: None significant. CT venography of the head Anterior aspect of the superior sagittal sinus is patent. Again demonstrated is partial thrombosis of the superior sagittal sinus at the vertex. This is not occlusive as flow can be demonstrated around the margins of the thrombus. This is fairly segmental, involving a 5-6 cm segment of the sinus. Beyond that, the distal superior sagittal sinus is patent. Both transverse sinuses are patent. Compared to the initial presentation noncontrast CT and MRI, the thrombus does not  appear progressive. Thrombosed superficial draining vein on the left again visible, similar in appearance. IMPRESSION: 1. No arterial pathology in the neck. 2. No intracranial large or medium vessel occlusion or correctable proximal stenosis. 3. Partial thrombosis of the superior sagittal sinus at the vertex, not occlusive as flow can be demonstrated around the margins of the thrombus. This is fairly segmental, involving a 5-6 cm segment of the sinus. Beyond that, the distal superior sagittal sinus is patent. Thrombosed superficial draining vein on the left again visible, similar in appearance. 4. Hemorrhagic infarction in the left parietal cortical and subcortical brain redemonstrated. Low-density edema is slightly increased, particularly along the anterior margin. No new or enlarging bleeds. Mild swelling with mass-effect and 1 or 2 mm of left-to-right shift and flattening of the left lateral ventricle. Electronically Signed   By: Nelson Chimes M.D.   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  1. Does the need for close, 24 hr/day medical supervision in concert with the patient's rehab needs make it unreasonable for this patient to be served in a less intensive setting? Yes 2. Co-Morbidities requiring supervision/potential complications: seizure, hypercoagulable state, OCP use, HLD, hx of ETOH ise, overweight (BMI 28.27) 3. Due to bladder management, bowel management, safety, skin/wound care, disease management, medication administration, pain management and patient education, does the patient require 24 hr/day rehab nursing? Yes 4. Does the patient require coordinated care of a physician, rehab nurse, therapy disciplines of PT, OT, SLP to address physical and functional deficits in the context of the above medical diagnosis(es)? Yes Addressing deficits in the following areas: balance, endurance,  locomotion, strength, transferring, bowel/bladder control, bathing, dressing, feeding, grooming, toileting, cognition and psychosocial support 5. Can the patient actively participate in an intensive therapy program of at least 3 hrs of therapy per day at least 5 days per week? Yes 6. The potential for patient to make measurable gains while on inpatient rehab is excellent 7. Anticipated functional outcomes upon discharge from inpatient rehab are modified independent  with PT, modified independent with OT, modified independent with SLP. 8. Estimated rehab length of stay to reach the above functional goals is: 10-12 days 9. Anticipated discharge destination: Home 10. Overall Rehab/Functional Prognosis: excellent  RECOMMENDATIONS: This patient's condition is appropriate for continued rehabilitative care in the following setting: CIR Patient has agreed to participate in recommended program. Yes Note that insurance prior authorization may be required for reimbursement for recommended care.  Comment: Thank you for this consult. Admission coordinator to follow.   I have personally performed a face to face diagnostic evaluation, including, but not limited to relevant history and physical exam findings, of this patient and developed relevant assessment and plan.  Additionally, I have reviewed and concur with the physician assistant's documentation above.  Leeroy Cha, MD  Lavon Paganini  Brinson, PA-C 03/30/2020

## 2020-03-30 NOTE — Progress Notes (Signed)
  Speech Language Pathology Treatment: Cognitive-Linquistic  Patient Details Name: Erica Rosario MRN: [id number] DOB: 1975-09-07 Today's Date: 03/30/2020 Time: 8242-3536 SLP Time Calculation (min) (ACUTE ONLY): 35 min  Assessment / Plan / Recommendation Clinical Impression  Pt continues to demonstrate severe working memory, visual and emergent awareness impairments. Today pt attempted to count coins. Initial attempt was independent with no accuracy, pt repeatedly recounted coins, could not keep a running total in her head. Repeated task with verbal cues for organization and total assist to demonstrate the benefit of note taking as a compensatory strategy. Pt able to complete task with max assist. Pt verbalized improved intellectual awareness of impairment after session. She will continue to need intensive interventions to start implementing strategies during ADL's. Strongly suggested pt should consider inpatient rehabilitation if it is offered to her given the severity of her impairment.    HPI HPI: Erica Rosario is a 44 y.o. female no PMH, presented to [facility] ED with complaints of ongoing headache and neck stiffness. Additional complaints of blurred and double vision mostly looking to the right. CT-scan of the brain from [facility] revealing [employer] in the left parietal lobe and an atypical vascular distribution concerning for a venous infarct with hemorrhagic transformation. 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 As she was being evaluated by the ED providers after transfer to [facility], she had a witnessed tonic-clonic seizure. Possible aspiration pneumonia due to seizure and depressed level of consciousness- CXR revealed hazy airspace opacity at the left lung base, likely atelectasis and/or infectious etiology.      SLP Plan  Continue with current plan of  care       Recommendations                   Follow up Recommendations: Inpatient Rehab Plan: Continue with current plan of care       GO               Herbie Baltimore, MA [place] Pager [phone] Office [phone]  Lynann Beaver 03/30/2020, 10:05 AM

## 2020-03-30 NOTE — PMR Pre-admission (Signed)
PMR Admission Coordinator Pre-Admission Assessment  Patient: Erica Rosario is an 44 y.o., female MRN: [id number] DOB: 10-20-1975 Height: 4\' 11"  (149.9 cm) Weight: 63.5 kg              Insurance Information HMO:     PPO:      PCP:      IPA:      80/20:      OTHER:  PRIMARY: Uninsured-Medicaid Potential  SECONDARY:       Policy#:       Phone#:   Development worker, community:       Phone#:   The Therapist, art Information Summary" for patients in Inpatient Rehabilitation Facilities with attached "Privacy Act [place] Records" was provided and verbally reviewed with: N/A  Emergency Contact Information Contact Information    Name Relation Home Work Mobile   [place] Spouse [phone]  [phone]   [facility] Sister [phone]       Current Medical History  Patient Admitting Diagnosis: ICH History of Present Illness: Erica Rosario is a 44 y.o. right-handed female with unremarkable past medical history on no prescription medications.  Per chart review patient lives with spouse independent prior to admission.  Multilevel home.  Presented to [facility] 03/25/2020 with headache and neck stiffness as well as blurred vision.  Patient has been vaccinated against Covid 06 September 2019.  There was a reported seizure in the ED.  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  There is a thrombosis of a draining cortical vein in the region of hemorrhage.  2 rounded areas of enhancement within the left occipital hemorrhage possibly representing active ongoing hemorrhage.  CT angiogram of head and neck no arterial pathology in the neck.  No intracranial large or medium vessel occlusion.  Admission chemistries potassium 3.2, AST 48, ALT 55, hemoglobin 9.2.  EEG negative for seizure.  Echocardiogram with ejection fraction of 60 to 65% no wall motion  abnormalities.  Neurology follow-up placed on Keppra for seizure prophylaxis.  Intravenous heparin initiated transitioned to Coumadin.  Therapy evaluations completed with recommendations of physical medicine rehab consult. Complete NIHSS TOTAL: 5 Glasgow Coma Scale Score: 15  Past Medical History  History reviewed. No pertinent past medical history.  Family History  family history is not on file.  Prior Rehab/Hospitalizations:  Has the patient had prior rehab or hospitalizations prior to admission? No  Has the patient had major surgery during 100 days prior to admission? No  Current Medications   Current Facility-Administered Medications:  .   stroke: mapping our early stages of recovery book, , Does not apply, Once, Amie Portland, MD .  0.9 %  sodium chloride infusion, , Intravenous, Continuous, Rosalin Hawking, MD, Last Rate: 50 mL/hr at 03/29/20 2143, New Bag at 03/29/20 2143 .  acetaminophen (TYLENOL) tablet 650 mg, 650 mg, Oral, Q4H PRN, 650 mg at 03/30/20 1611 **OR** acetaminophen (TYLENOL) 160 MG/5ML solution 650 mg, 650 mg, Per Tube, Q4H PRN **OR** acetaminophen (TYLENOL) suppository 650 mg, 650 mg, Rectal, Q4H PRN, Amie Portland, MD .  atorvastatin (LIPITOR) tablet 40 mg, 40 mg, Oral, Daily, Rosalin Hawking, MD, 40 mg at 03/30/20 1001 .  butalbital-acetaminophen-caffeine (FIORICET) 50-325-40 MG per tablet 1 tablet, 1 tablet, Oral, Q8H PRN, Rosalin Hawking, MD, 1 tablet at 03/30/20 0810 .  coumadin book, , Does not apply, Once, Karren Cobble, RPH .  heparin ADULT infusion 100 units/mL (25000  units/221mL sodium chloride 0.45%), 650 Units/hr, Intravenous, Continuous, Priscella Mann, RPH, Last Rate: 6 mL/hr at 03/30/20 0812, 600 Units/hr at 03/30/20 0812 .  labetalol (NORMODYNE) injection 10-20 mg, 10-20 mg, Intravenous, Q2H PRN, Rosalin Hawking, MD .  levETIRAcetam (KEPPRA) tablet 500 mg, 500 mg, Oral, BID, Rosalin Hawking, MD, 500 mg at 03/30/20 1001 .  pantoprazole (PROTONIX) EC tablet 40  mg, 40 mg, Oral, Daily, Rosalin Hawking, MD, 40 mg at 03/30/20 1001 .  senna-docusate (Senokot-S) tablet 1 tablet, 1 tablet, Oral, BID, Amie Portland, MD, 1 tablet at 03/30/20 1001 .  warfarin (COUMADIN) tablet 2 mg, 2 mg, Oral, ONCE-1600, Pierce, Dwayne A, RPH .  Warfarin - Pharmacist Dosing Inpatient, , Does not apply, q1600, Karren Cobble [facility], Given at 03/29/20 1642  Patients Current Diet:  Diet Order            Diet regular Room service appropriate? Yes with Assist; Fluid consistency: Thin  Diet effective now                 Precautions / Restrictions Precautions Precautions: Fall Precaution Comments: R sided vision deficits, Spanish interpreter needed Restrictions Weight Bearing Restrictions: No   Has the patient had 2 or more falls or a fall with injury in the past year?No  Prior Activity Level Community (5-7x/wk): Pt. was active in the community PTA  Prior Functional Level Prior Function Level of Independence: Independent Comments: works sewing  Self Care: Did the patient need help bathing, dressing, using the toilet or eating?  Independent  Indoor Mobility: Did the patient need assistance with walking from room to room (with or without device)? Independent  Stairs: Did the patient need assistance with internal or external stairs (with or without device)? Independent  Functional Cognition: Did the patient need help planning regular tasks such as shopping or remembering to take medications? Independent  Home Assistive Devices / Equipment Home Equipment: Grab bars - tub/shower, Hand held shower head, Shower seat, Grab bars - toilet  Prior Device Use: Indicate devices/aids used by the patient prior to current illness, exacerbation or injury? None of the above  Current Functional Level Cognition  Arousal/Alertness: Awake/alert Overall Cognitive Status: Impaired/Different from baseline Orientation Level: Oriented X4 Safety/Judgement: Decreased awareness of  safety, Decreased awareness of deficits General Comments: Pt continues to require cues to scan and turn head to R to be aware of her surroundings for safety measures. Pt required cues to manage lines this date. Extra time and several explanations to perform difficult taks requiring multiple steps. Attention: Focused, Sustained, Selective Focused Attention: Appears intact Sustained Attention: Appears intact Selective Attention: Impaired Memory: Impaired Memory Impairment: Storage deficit, Retrieval deficit Awareness: Appears intact Problem Solving: Impaired Problem Solving Impairment: Verbal basic, Functional basic Safety/Judgment: Appears intact    Extremity Assessment (includes Sensation/Coordination)  Upper Extremity Assessment: RUE deficits/detail RUE Deficits / Details: very slight drift when eyes are closed and asked to keep arms up in the air  Lower Extremity Assessment: Overall WFL for tasks assessed    ADLs  Overall ADL's : Needs assistance/impaired Eating/Feeding: Supervision/ safety, Sitting Eating/Feeding Details (indicate cue type and reason): due to vision Grooming: Set up, Supervision/safety, Sitting Grooming Details (indicate cue type and reason): due to vision Upper Body Bathing: Set up, Supervision/ safety, Sitting Upper Body Bathing Details (indicate cue type and reason): due to vision Lower Body Bathing: Minimal assistance, Sit to/from stand Upper Body Dressing : Supervision/safety, Set up, Sitting Upper Body Dressing Details (indicate cue type and reason): due  to vision Lower Body Dressing: Minimal assistance, Sit to/from stand Toilet Transfer: Minimal assistance, Ambulation Toilet Transfer Details (indicate cue type and reason): bed>recliner>door>back to recliner (slow pace due to decreased balance) Toileting- Clothing Manipulation and Hygiene: Minimal assistance, Sit to/from stand    Mobility  Overal bed mobility: Needs Assistance Bed Mobility: Supine to  Sit, Sit to Supine Supine to sit: Supervision Sit to supine: Supervision General bed mobility comments: Bed flat, pt utilizing end of bed rail to pull trunk more anteriorly and to push herself towards EOB.     Transfers  Overall transfer level: Needs assistance Equipment used: None Transfers: Sit to/from Stand Sit to Stand: Min assist Stand pivot transfers: Min assist, +2 safety/equipment General transfer comment: Pt able to come to stand with slight unsteadiness noted upon coming to stand resulting in the pt requiring a brief moment prior to ambulating. MinA for safety reasons.    Ambulation / Gait / Stairs / Wheelchair Mobility  Ambulation/Gait Ambulation/Gait assistance: Herbalist (Feet): 220 Feet Assistive device: IV Pole Gait Pattern/deviations: Step-through pattern, Decreased stride length, Shuffle, Drifts right/left General Gait Details: Pt pushed IV pole in R hand during gait, and despite cues to bring IV pole more lateral to pt she continued to place it proximal to R foot. Required assistance to manage IV pole, esp with turns, and minA and intermittent modA for balance with her drifting towards the R on occasion. Gait velocity: reduced Gait velocity interpretation: <1.31 ft/sec, indicative of household ambulator    Posture / Balance Dynamic Sitting Balance Sitting balance - Comments: Able to sit EOB statically without UE support without LOB. Balance Overall balance assessment: Needs assistance Sitting-balance support: No upper extremity supported, Feet unsupported Sitting balance-Leahy Scale: Good Sitting balance - Comments: Able to sit EOB statically without UE support without LOB. Standing balance support: Single extremity supported Standing balance-Leahy Scale: Poor Standing balance comment: Unsteadiness noted with standing mobility, requiring assistance secondary to vision deficits and dizziness. Standardized Balance Assessment Standardized Balance  Assessment : Dynamic Gait Index Dynamic Gait Index Level Surface: Mild Impairment Change in Gait Speed: Mild Impairment Gait with Horizontal Head Turns: Severe Impairment Gait with Vertical Head Turns: Moderate Impairment Gait and Pivot Turn: Mild Impairment Step Over Obstacle: Severe Impairment Step Around Obstacles: Severe Impairment Steps: Moderate Impairment (assumed, did not test) Total Score: 8    Special needs/care consideration  Special service needs Pt. And family require Spanish translator     Previous Home Environment (from acute therapy documentation) Living Arrangements: Spouse/significant other  Lives With: Spouse Available Help at Discharge: Family, Available 24 hours/day Type of Home: House Home Layout: One level Home Access: Level entry Bathroom Shower/Tub: Tub/shower unit, Architectural technologist: Standard  Discharge Living Setting Plans for Discharge Living Setting: Patient's home Type of Home at Discharge: House Discharge Home Layout: One level Discharge Home Access: Stairs to enter Entrance Stairs-Rails: Can reach both Entrance Stairs-Number of Steps: 2 Discharge Bathroom Shower/Tub: Tub/shower unit Discharge Bathroom Toilet: Standard Discharge Bathroom Accessibility: Yes How Accessible: Accessible via walker Does the patient have any problems obtaining your medications?: No  Social/Family/Support Systems Patient Roles: Spouse Contact Information:  [phone]  Anticipated Caregiver: Heikes,Justiniano  Anticipated Caregiver's Contact Information:  [phone]  Ability/Limitations of Caregiver:  (none) Caregiver Availability: Intermittent Discharge Plan Discussed with Primary Caregiver: Yes Is Caregiver In Agreement with Plan?: Yes Does Caregiver/Family have Issues with Lodging/Transportation while Pt is in Rehab?: Yes   Goals Patient/Family Goal for Rehab: PT/OT/SLP mod I Expected length  of stay: 5-7 days Cultural Considerations: Needs  Spanish Interpreter Pt/Family Agrees to Admission and willing to participate: Yes Program Orientation Provided & Reviewed with Pt/Caregiver Including Roles  & Responsibilities: Yes   Decrease burden of Care through IP rehab admission: Specialzed equipment needs, Decrease number of caregivers and Patient/family education   Possible need for SNF placement upon discharge: Not anticipated   Patient Condition: This patient's condition remains as documented in the consult dated 10/, in which the Rehabilitation Physician determined and documented that/ the patient's condition is appropriate for intensive rehabilitative care in an inpatient rehabilitation facility. Will admit to inpatient rehab today.   Preadmission Screen Completed By:  Genella Mech, CCC-SLP, 03/30/2020 4:37 PM ______________________________________________________________________   Discussed status with Dr. Ranell Patrick on at  and received approval for admission today.  Admission Coordinator:  Genella Mech, [id number] Sudie Grumbling 04/01/2019

## 2020-03-30 NOTE — Progress Notes (Signed)
Physical Therapy Treatment Patient Details Name: Erica Rosario MRN: [id number] DOB: 12/01/1975 Today's Date: 03/30/2020    History of Present Illness Erica Rosario is a 44 y.o. female no PMH, presented to [facility] this afternoon with complaints of headache and neck stiffness.Brain imaging revealed ICH which is close to the superior left parietal lobe in proximity with the venous drainage making suspicion for a venous infarct that might have bled.MRI brain confirmed the bleed and also revealed a cerebral venous thrombosis on superior sagittal sinus higher up with a thrombus also in the draining cortical vein.    PT Comments    Pt continues to make progress towards her goals as she was able to perform all bed mobility with supervision this date, but did utilize the rail at the end of the bed. She continues to report dizziness, impacting her safety with mobility. She requires minA for transfers secondary to her unsteadiness, and minA with intermittent modA when pt loses balance during gait. She tends to drift towards her R and secondary to her visual deficits on the R she was cued to scan her surroundings by turning her head during gait to point out decorations on the walls, with her stopping to look around often. Her DGI score was 8 this date, suggesting the patient is at risk for falls, limiting her safety with mobility. CIR continues to be an appropriate recommendation based on her safety concerns, young age, willingness/motivation to participate and improve, and her PLOF varying greatly from her current level of function. Will continue to follow-up with acute PT services to maximize her independence and safety with mobility.  Follow Up Recommendations  CIR;Supervision/Assistance - 24 hour     Equipment Recommendations  3in1 (PT);Rolling walker with 5" wheels (TBD as pt progresses)    Recommendations for Other Services Rehab consult     Precautions / Restrictions  Precautions Precautions: Fall Precaution Comments: R sided vision deficits, Spanish interpreter needed Restrictions Weight Bearing Restrictions: No    Mobility  Bed Mobility Overal bed mobility: Needs Assistance Bed Mobility: Supine to Sit;Sit to Supine     Supine to sit: Supervision Sit to supine: Supervision   General bed mobility comments: Bed flat, pt utilizing end of bed rail to pull trunk more anteriorly and to push herself towards EOB.   Transfers Overall transfer level: Needs assistance Equipment used: None Transfers: Sit to/from Stand Sit to Stand: Min assist         General transfer comment: Pt able to come to stand with slight unsteadiness noted upon coming to stand resulting in the pt requiring a brief moment prior to ambulating. MinA for safety reasons.  Ambulation/Gait Ambulation/Gait assistance: Min Web designer (Feet): 220 Feet Assistive device: IV Pole Gait Pattern/deviations: Step-through pattern;Decreased stride length;Shuffle;Drifts right/left Gait velocity: reduced Gait velocity interpretation: <1.31 ft/sec, indicative of household ambulator General Gait Details: Pt pushed IV pole in R hand during gait, and despite cues to bring IV pole more lateral to pt she continued to place it proximal to R foot. Required assistance to manage IV pole, esp with turns, and minA and intermittent modA for balance with her drifting towards the R on occasion.   Stairs             Wheelchair Mobility    Modified Rankin (Stroke Patients Only) Modified Rankin (Stroke Patients Only) Pre-Morbid Rankin Score: No symptoms Modified Rankin: Moderately severe disability     Balance Overall balance assessment: Needs assistance Sitting-balance support: No upper  extremity supported;Feet unsupported Sitting balance-Leahy Scale: Good Sitting balance - Comments: Able to sit EOB statically without UE support without LOB.   Standing balance support: Single  extremity supported Standing balance-Leahy Scale: Poor Standing balance comment: Unsteadiness noted with standing mobility, requiring assistance secondary to vision deficits and dizziness.                 Standardized Balance Assessment Standardized Balance Assessment : Dynamic Gait Index   Dynamic Gait Index Level Surface: Mild Impairment Change in Gait Speed: Mild Impairment Gait with Horizontal Head Turns: Severe Impairment Gait with Vertical Head Turns: Moderate Impairment Gait and Pivot Turn: Mild Impairment Step Over Obstacle: Severe Impairment Step Around Obstacles: Severe Impairment Steps: Moderate Impairment (assumed, did not test) Total Score: 8      Cognition Arousal/Alertness: Awake/alert Behavior During Therapy: WFL for tasks assessed/performed Overall Cognitive Status: Impaired/Different from baseline Area of Impairment: Safety/judgement;Awareness;Problem solving                         Safety/Judgement: Decreased awareness of safety;Decreased awareness of deficits Awareness: Emergent Problem Solving: Requires verbal cues General Comments: Pt continues to require cues to scan and turn head to R to be aware of her surroundings for safety measures. Pt required cues to manage lines this date. Extra time and several explanations to perform difficult taks requiring multiple steps.      Exercises      General Comments General comments (skin integrity, edema, etc.): Utilized translator: Ana      Pertinent Vitals/Pain Pain Assessment: 0-10 Pain Score: 9  Pain Location: head Pain Descriptors / Indicators: Discomfort;Grimacing Pain Intervention(s): Limited activity within patient's tolerance;Monitored during session;Patient requesting pain meds-RN notified    Home Living                      Prior Function            PT Goals (current goals can now be found in the care plan section) Acute Rehab PT Goals Patient Stated Goal: to get  better and go home PT Goal Formulation: With patient/family Time For Goal Achievement: 04/10/20 Potential to Achieve Goals: Good Progress towards PT goals: Progressing toward goals    Frequency    Min 4X/week      PT Plan Current plan remains appropriate    Co-evaluation     PT goals addressed during session: Mobility/safety with mobility;Balance        AM-PAC PT "6 Clicks" Mobility   Outcome Measure  Help needed turning from your back to your side while in a flat bed without using bedrails?: None Help needed moving from lying on your back to sitting on the side of a flat bed without using bedrails?: None Help needed moving to and from a bed to a chair (including a wheelchair)?: A Little Help needed standing up from a chair using your arms (e.g., wheelchair or bedside chair)?: A Little Help needed to walk in hospital room?: A Little Help needed climbing 3-5 steps with a railing? : A Lot 6 Click Score: 19    End of Session Equipment Utilized During Treatment: Gait belt Activity Tolerance: Patient tolerated treatment well Patient left: in bed;with call bell/phone within reach;with family/visitor present (husband present) Nurse Communication: Patient requests pain meds PT Visit Diagnosis: Unsteadiness on feet (R26.81);Other abnormalities of gait and mobility (R26.89);Muscle weakness (generalized) (M62.81);Difficulty in walking, not elsewhere classified (R26.2);Other symptoms and signs involving the nervous system (R29.898);Hemiplegia and  hemiparesis Hemiplegia - Right/Left: Right 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

## 2020-03-31 ENCOUNTER — Other Ambulatory Visit: Payer: Self-pay

## 2020-03-31 ENCOUNTER — Encounter (HOSPITAL_COMMUNITY): Payer: Self-pay | Admitting: Physical Medicine & Rehabilitation

## 2020-03-31 ENCOUNTER — Inpatient Hospital Stay (HOSPITAL_COMMUNITY)
Admission: RE | Admit: 2020-03-31 | Discharge: 2020-04-03 | DRG: 091 | Disposition: A | Discharge status: Home / Self Care | Payer: Medicaid Other | Source: Intra-hospital | Attending: Physical Medicine & Rehabilitation | Admitting: Physical Medicine & Rehabilitation

## 2020-03-31 DIAGNOSIS — E785 Hyperlipidemia, unspecified: Secondary | ICD-10-CM | POA: Diagnosis present

## 2020-03-31 DIAGNOSIS — I636 Cerebral infarction due to cerebral venous thrombosis, nonpyogenic: Secondary | ICD-10-CM

## 2020-03-31 DIAGNOSIS — I611 Nontraumatic intracerebral hemorrhage in hemisphere, cortical: Secondary | ICD-10-CM | POA: Diagnosis present

## 2020-03-31 DIAGNOSIS — E663 Overweight: Secondary | ICD-10-CM | POA: Diagnosis present

## 2020-03-31 DIAGNOSIS — G08 Intracranial and intraspinal phlebitis and thrombophlebitis: Secondary | ICD-10-CM

## 2020-03-31 DIAGNOSIS — R7401 Elevation of levels of liver transaminase levels: Secondary | ICD-10-CM | POA: Diagnosis present

## 2020-03-31 DIAGNOSIS — R110 Nausea: Secondary | ICD-10-CM

## 2020-03-31 DIAGNOSIS — R197 Diarrhea, unspecified: Secondary | ICD-10-CM | POA: Diagnosis present

## 2020-03-31 DIAGNOSIS — D649 Anemia, unspecified: Secondary | ICD-10-CM | POA: Diagnosis present

## 2020-03-31 DIAGNOSIS — I619 Nontraumatic intracerebral hemorrhage, unspecified: Secondary | ICD-10-CM | POA: Diagnosis present

## 2020-03-31 DIAGNOSIS — R2689 Other abnormalities of gait and mobility: Principal | ICD-10-CM | POA: Diagnosis present

## 2020-03-31 DIAGNOSIS — G441 Vascular headache, not elsewhere classified: Secondary | ICD-10-CM | POA: Diagnosis present

## 2020-03-31 DIAGNOSIS — G3184 Mild cognitive impairment, so stated: Secondary | ICD-10-CM | POA: Diagnosis present

## 2020-03-31 DIAGNOSIS — R569 Unspecified convulsions: Secondary | ICD-10-CM

## 2020-03-31 DIAGNOSIS — I610 Nontraumatic intracerebral hemorrhage in hemisphere, subcortical: Secondary | ICD-10-CM | POA: Diagnosis not present

## 2020-03-31 DIAGNOSIS — D6859 Other primary thrombophilia: Secondary | ICD-10-CM | POA: Diagnosis present

## 2020-03-31 LAB — BASIC METABOLIC PANEL
Anion gap: 9 (ref 5–15)
BUN: <5 mg/dL — ABNORMAL LOW (ref 6–20)
CO2: 22 mmol/L (ref 22–32)
Calcium: 8.9 mg/dL (ref 8.9–10.3)
Chloride: 109 mmol/L (ref 98–111)
Creatinine, Ser: 0.7 mg/dL (ref 0.44–1.00)
GFR, Estimated: >60 mL/min (ref >60)
Glucose, Bld: 84 mg/dL (ref 70–99)
Potassium: 3.7 mmol/L (ref 3.5–5.1)
Sodium: 140 mmol/L (ref 135–145)

## 2020-03-31 LAB — CBC
HCT: 27.9 % — ABNORMAL LOW (ref 36.0–46.0)
Hemoglobin: 8.3 g/dL — ABNORMAL LOW (ref 12.0–15.0)
MCH: 22.1 pg — ABNORMAL LOW (ref 26.0–34.0)
MCHC: 29.7 g/dL — ABNORMAL LOW (ref 30.0–36.0)
MCV: 74.2 fL — ABNORMAL LOW (ref 80.0–100.0)
Platelets: 377 10*3/uL (ref 150–400)
RBC: 3.76 MIL/uL — ABNORMAL LOW (ref 3.87–5.11)
RDW: 18.7 % — ABNORMAL HIGH (ref 11.5–15.5)
WBC: 6.3 10*3/uL (ref 4.0–10.5)
nRBC: 0.3 % — ABNORMAL HIGH (ref 0.0–0.2)

## 2020-03-31 LAB — PROTHROMBIN GENE MUTATION

## 2020-03-31 LAB — HEPARIN LEVEL (UNFRACTIONATED)
Heparin Unfractionated: 0.5 IU/mL (ref 0.30–0.70)
Heparin Unfractionated: 0.37 IU/mL (ref 0.30–0.70)

## 2020-03-31 LAB — PROTIME-INR
INR: 1.8 — ABNORMAL HIGH (ref 0.8–1.2)
Prothrombin Time: 20.3 seconds — ABNORMAL HIGH (ref 11.4–15.2)

## 2020-03-31 MED ORDER — SENNOSIDES-DOCUSATE SODIUM 8.6-50 MG PO TABS
1.0000 | ORAL_TABLET | Freq: Two times a day (BID) | ORAL | Status: DC
  Administered 2020-03-31 – 2020-04-03 (×2): 1 via ORAL
  Filled 2020-03-31 (×4): qty 1

## 2020-03-31 MED ORDER — WARFARIN - PHARMACIST DOSING INPATIENT: Freq: Every day | Status: DC

## 2020-03-31 MED ORDER — WARFARIN SODIUM 4 MG PO TABS
4.0000 mg | ORAL_TABLET | Freq: Once | ORAL | Status: DC
  Filled 2020-03-31: qty 1

## 2020-03-31 MED ORDER — ACETAMINOPHEN 160 MG/5ML PO SOLN: 650.0000 mg | ORAL | Status: DC | PRN

## 2020-03-31 MED ORDER — BUTALBITAL-APAP-CAFFEINE 50-325-40 MG PO TABS: 1.0000 | ORAL_TABLET | Freq: Three times a day (TID) | ORAL | 0 refills | Status: DC | PRN

## 2020-03-31 MED ORDER — HEPARIN (PORCINE) 25000 UT/250ML-% IV SOLN
750.0000 [IU]/h | INTRAVENOUS | Status: DC
  Administered 2020-04-01: 750 [IU]/h via INTRAVENOUS
  Filled 2020-03-31 (×2): qty 250

## 2020-03-31 MED ORDER — HEPARIN (PORCINE) 25000 UT/250ML-% IV SOLN: 650.0000 [IU]/h | INTRAVENOUS | Status: DC

## 2020-03-31 MED ORDER — WARFARIN SODIUM 4 MG PO TABS
4.0000 mg | ORAL_TABLET | Freq: Once | ORAL | Status: AC
  Administered 2020-03-31: 4 mg via ORAL
  Filled 2020-03-31 (×2): qty 1

## 2020-03-31 MED ORDER — PANTOPRAZOLE SODIUM 40 MG PO TBEC
40.0000 mg | DELAYED_RELEASE_TABLET | Freq: Every day | ORAL | Status: DC
  Administered 2020-04-01 – 2020-04-03 (×3): 40 mg via ORAL
  Filled 2020-03-31 (×4): qty 1

## 2020-03-31 MED ORDER — LEVETIRACETAM 500 MG PO TABS
500.0000 mg | ORAL_TABLET | Freq: Two times a day (BID) | ORAL | Status: DC
  Administered 2020-03-31 – 2020-04-03 (×6): 500 mg via ORAL
  Filled 2020-03-31 (×6): qty 1

## 2020-03-31 MED ORDER — ATORVASTATIN CALCIUM 40 MG PO TABS
40.0000 mg | ORAL_TABLET | Freq: Every day | ORAL | Status: DC
  Administered 2020-04-01 – 2020-04-03 (×3): 40 mg via ORAL
  Filled 2020-03-31 (×3): qty 1

## 2020-03-31 MED ORDER — ATORVASTATIN CALCIUM 40 MG PO TABS: 40.0000 mg | ORAL_TABLET | Freq: Every day | ORAL | Status: DC

## 2020-03-31 MED ORDER — ACETAMINOPHEN 325 MG PO TABS
650.0000 mg | ORAL_TABLET | ORAL | Status: DC | PRN
  Administered 2020-04-01 – 2020-04-03 (×5): 650 mg via ORAL
  Filled 2020-03-31 (×5): qty 2

## 2020-03-31 MED ORDER — SODIUM CHLORIDE 0.9 % IV SOLN: 50.0000 mL | INTRAVENOUS | 0 refills | Status: DC

## 2020-03-31 MED ORDER — SENNOSIDES-DOCUSATE SODIUM 8.6-50 MG PO TABS: 1.0000 | ORAL_TABLET | Freq: Two times a day (BID) | ORAL | Status: DC

## 2020-03-31 MED ORDER — BUTALBITAL-APAP-CAFFEINE 50-325-40 MG PO TABS
1.0000 | ORAL_TABLET | Freq: Three times a day (TID) | ORAL | Status: DC | PRN
  Administered 2020-03-31 – 2020-04-03 (×7): 1 via ORAL
  Filled 2020-03-31 (×7): qty 1

## 2020-03-31 MED ORDER — LEVETIRACETAM 500 MG PO TABS: 500.0000 mg | ORAL_TABLET | Freq: Two times a day (BID) | ORAL | Status: DC

## 2020-03-31 MED ORDER — ACETAMINOPHEN 650 MG RE SUPP: 650.0000 mg | RECTAL | Status: DC | PRN

## 2020-03-31 MED ORDER — ACETAMINOPHEN 325 MG PO TABS: 650.0000 mg | ORAL_TABLET | ORAL | Status: DC | PRN

## 2020-03-31 MED ORDER — WARFARIN SODIUM 4 MG PO TABS: 4.0000 mg | ORAL_TABLET | Freq: Once | ORAL | Status: DC

## 2020-03-31 NOTE — Discharge Summary (Addendum)
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   atorvastatin 40 MG tablet Commonly known as: LIPITOR Take 1 tablet (40 mg total) by mouth daily. Start taking on: April 01, 2020   butalbital-acetaminophen-caffeine 50-325-40 MG tablet Commonly known as: FIORICET Take 1 tablet by mouth every 8 (eight) hours as needed for headache.   heparin 25000-0.45 UT/250ML-% infusion Inject 650 Units/hr into the vein continuous.   levETIRAcetam 500 MG tablet Commonly known as: KEPPRA Take 1 tablet (500 mg total) by mouth 2 (two) times daily.   senna-docusate 8.6-50 MG tablet Commonly known as: Senokot-S Take 1 tablet by mouth 2 (two) times daily.   sodium chloride 0.9 % infusion Inject 50 mLs into the vein continuous.   warfarin 4 MG tablet Commonly known as: COUMADIN Take 1 tablet (4 mg total) by mouth one time only at 4 PM.        LABORATORY STUDIES CBC    Component Value Date/Time   WBC 6.3 03/31/2020 0323   RBC 3.76 (L) 03/31/2020  0323   HGB 8.3 (L) 03/31/2020 0323   HCT 27.9 (L) 03/31/2020 0323   PLT 377 03/31/2020 0323   MCV 74.2 (L) 03/31/2020 0323   MCH 22.1 (L) 03/31/2020 0323   MCHC 29.7 (L) 03/31/2020 0323   RDW 18.7 (H) 03/31/2020 0323   LYMPHSABS 2.2 03/25/2020 1229   MONOABS 0.5 03/25/2020 1229   EOSABS 0.0 03/25/2020 1229   BASOSABS 0.0 03/25/2020 1229   CMP    Component Value Date/Time   NA 140 03/31/2020 0323   K 3.7 03/31/2020 0323   CL 109 03/31/2020 0323   CO2 22 03/31/2020 0323   GLUCOSE 84 03/31/2020 0323   BUN <5 (L) 03/31/2020 0323   CREATININE 0.70 03/31/2020 0323   CALCIUM 8.9 03/31/2020 0323   PROT 6.4 (L) 03/26/2020 1010   ALBUMIN 3.2 (L) 03/26/2020 1010   AST 32 03/26/2020 1010   ALT 44 03/26/2020 1010   ALKPHOS 50 03/26/2020 1010   BILITOT 0.3 03/26/2020 1010   GFRNONAA >60 03/31/2020 0323   COAGS Lab Results  Component Value Date   INR 1.8 (H) 03/31/2020   INR 1.7 (H) 03/30/2020   INR 1.3 (H) 03/29/2020   Lipid Panel    Component Value Date/Time   CHOL 210 (H) 03/26/2020 1010   TRIG 132 03/26/2020 1010   HDL 53 03/26/2020 1010   CHOLHDL 4.0 03/26/2020 1010   VLDL 26 03/26/2020 1010   LDLCALC NOT CALCULATED  03/26/2020 1010   HgbA1C  Lab Results  Component Value Date   HGBA1C 5.6 03/26/2020   Urinalysis    Component Value Date/Time   COLORURINE YELLOW 03/26/2020 1647   APPEARANCEUR CLEAR 03/26/2020 1647   LABSPEC 1.010 03/26/2020 1647   PHURINE 6.0 03/26/2020 1647   GLUCOSEU NEGATIVE 03/26/2020 1647   HGBUR NEGATIVE 03/26/2020 1647   BILIRUBINUR NEGATIVE 03/26/2020 1647   KETONESUR 5 (A) 03/26/2020 1647   PROTEINUR NEGATIVE 03/26/2020 1647   UROBILINOGEN 0.2 07/02/2010 0959   NITRITE NEGATIVE 03/26/2020 1647   LEUKOCYTESUR NEGATIVE 03/26/2020 1647   Urine Drug Screen     Component Value Date/Time   LABOPIA NONE DETECTED 03/26/2020 1647   COCAINSCRNUR NONE DETECTED 03/26/2020 1647   LABBENZ POSITIVE (A) 03/26/2020 1647   AMPHETMU NONE DETECTED  03/26/2020 1647   THCU NONE DETECTED 03/26/2020 1647   LABBARB NONE DETECTED 03/26/2020 1647     SIGNIFICANT DIAGNOSTIC STUDIES CT ANGIO HEAD W OR WO CONTRAST  Result Date: 03/28/2020 CLINICAL DATA:  Follow-up venous sinus thrombosis with left parietal venous infarction. EXAM: CT HEAD WITHOUT CONTRAST CT arteriography and venography OF THE HEAD and neck TECHNIQUE: Contiguous axial images were obtained from the base of the skull through the vertex without intravenous contrast. Multidetector CT imaging of the head was performed using the standard protocol during bolus administration of intravenous contrast. Multiplanar CT image reconstructions and MIPs were obtained to evaluate the vascular anatomy. CONTRAST:  57mL OMNIPAQUE IOHEXOL 350 MG/ML SOLN COMPARISON:  Multiple previous exams most recently 03/25/2020 FINDINGS: CT HEAD Brain: Hemorrhagic infarction in the left parietal cortical and subcortical brain redemonstrated. Low-density is slightly increased, particularly along the anterior margin. Hemorrhagic foci are stable with no new or enlarging bleeds. Mild swelling with mass-effect and 1 or 2 mm of left-to-right shift and flattening of the left lateral ventricle. Vascular: See below Skull: Negative Sinuses/Orbits: Clear/normal CTA neck: Aortic arch is normal. Right common carotid artery is widely patent to the bifurcation. Normal carotid bifurcation. Normal cervical ICA. Left common carotid artery widely patent to the bifurcation. Carotid bifurcation is normal. Normal left ICA. Both vertebral arteries widely patent at their origins and through the cervical region to the foramen magnum. CTA HEAD Anterior circulation: Both internal carotid arteries widely patent through the skull base and siphon regions. The anterior and middle cerebral vessels are normal without proximal stenosis, aneurysm or vascular malformation. Posterior circulation: Both vertebral arteries widely patent to the basilar. No basilar  stenosis. Posterior circulation branch vessels are normal. Venous sinuses: See results of CT venography. Anatomic variants: None significant. CT venography of the head Anterior aspect of the superior sagittal sinus is patent. Again demonstrated is partial thrombosis of the superior sagittal sinus at the vertex. This is not occlusive as flow can be demonstrated around the margins of the thrombus. This is fairly segmental, involving a 5-6 cm segment of the sinus. Beyond that, the distal superior sagittal sinus is patent. Both transverse sinuses are patent. Compared to the initial presentation noncontrast CT and MRI, the thrombus does not appear progressive. Thrombosed superficial draining vein on the left again visible, similar in appearance. IMPRESSION: 1. No arterial pathology in the neck. 2. No intracranial large or medium vessel occlusion or correctable proximal stenosis. 3. Partial thrombosis of the superior sagittal sinus at the vertex, not occlusive as flow can be demonstrated around the margins of the thrombus. This is fairly segmental, involving a 5-6 cm segment of the sinus. Beyond that, the distal superior sagittal sinus is  patent. Thrombosed superficial draining vein on the left again visible, similar in appearance. 4. Hemorrhagic infarction in the left parietal cortical and subcortical brain redemonstrated. Low-density edema is slightly increased, particularly along the anterior margin. No new or enlarging bleeds. Mild swelling with mass-effect and 1 or 2 mm of left-to-right shift and flattening of the left lateral ventricle. Electronically Signed   By: Nelson Chimes M.D.   On: 03/28/2020 07:15   CT Head Wo Contrast  Result Date: 03/25/2020 CLINICAL DATA:  Initial evaluation for acute seizure. EXAM: CT HEAD WITHOUT CONTRAST TECHNIQUE: Contiguous axial images were obtained from the base of the skull through the vertex without intravenous contrast. COMPARISON:  Prior CT from earlier the same day.  FINDINGS: Brain: Venous infarction with hemorrhagic conversion involving the left parieto-occipital region again seen, not significantly changed in size and morphology as compared to prior CT from earlier the same day. Surrounding vasogenic edema and regional mass effect is relatively similar. Associated left-to-right shift measures up to 4 mm, unchanged. Partial effacement of the left lateral ventricle without hydrocephalus or trapping, also similar. Basilar cisterns remain patent. No transtentorial herniation. No other acute intracranial hemorrhage or large vessel territory infarct. No visible extra-axial fluid collection. Vascular: No hyperdense vessel. Skull: Scalp soft tissues and calvarium remain within normal limits. Sinuses/Orbits: Globes and orbital soft tissues demonstrate no new finding.Paranasal sinuses and mastoid air cells remain clear. Other: None. IMPRESSION: 1. No significant interval change in size and morphology of left parieto-occipital lobe venous infarction with hemorrhagic conversion. Surrounding vasogenic edema and regional mass effect with 4 mm of left-to-right shift, unchanged. No hydrocephalus or trapping. 2. No other new acute intracranial abnormality. Electronically Signed   By: Jeannine Boga M.D.   On: 03/25/2020 22:51   CT HEAD WO CONTRAST  Result Date: 03/25/2020 CLINICAL DATA:  Headaches and neck stiffness.  Follow-up hemorrhage. EXAM: CT HEAD WITHOUT CONTRAST TECHNIQUE: Contiguous axial images were obtained from the base of the skull through the vertex without intravenous contrast. COMPARISON:  Earlier same day FINDINGS: Brain: Venous infarction with hemorrhagic transformation in the left parietal region shows slightly more edema. The majority of the foci of internal hemorrhage are unchanged. There is a single new focus of hemorrhage at the vertex as marked by an arrow measuring 7 mm. The others appear stable. Mild swelling and mass effect no midline shift. No new area of  infarction identified. No hydrocephalus. No evidence of subarachnoid extension. Vascular: Somewhat hyperdense superior sagittal sinus consistent with thrombosis shown by MRI. Skull: Negative Sinuses/Orbits: Clear/normal Other: None IMPRESSION: 1. Venous infarction with hemorrhagic transformation in the left parietal region shows slightly more edema. The majority of the foci of internal hemorrhage are unchanged. There is a single new focus of hemorrhage at the vertex as marked by an arrow measuring 7 mm. The others appear stable. 2. Somewhat hyperdense superior sagittal sinus consistent with thrombosis shown by MRI. Electronically Signed   By: Nelson Chimes M.D.   On: 03/25/2020 20:28   CT Head Wo Contrast  Result Date: 03/25/2020 CLINICAL DATA:  Three day history of headache and visual disturbance. EXAM: CT HEAD WITHOUT CONTRAST TECHNIQUE: Contiguous axial images were obtained from the base of the skull through the vertex without intravenous contrast. COMPARISON:  None. FINDINGS: Brain: There is a hemorrhagic process in the left posterior parietal/upper occipital region with moderate surrounding edema which appears to extend all the way to the cortex on the coronal images. This could be a hemorrhagic infarction, an AVM, a  hypertensive process or less likely PRES syndrome. Recommend MRI brain without and with contrast for further evaluation. No significant mass effect or midline shift. No extra-axial fluid collections are identified. The right cerebral hemisphere appears normal. The brainstem and cerebellum are unremarkable. Vascular: No vascular calcifications or hyperdense vessels. Skull: No skull fracture or bone lesions. Sinuses/Orbits: The paranasal sinuses and mastoid air cells are clear. The globes are intact. Other: No scalp lesions or scalp hematoma. IMPRESSION: 1. Hemorrhagic process in the left posterior parietal region with moderate surrounding edema. This could be a hemorrhagic infarction, an AVM, a  hypertensive process or less likely PRES syndrome. Recommend MRI brain without and with contrast for further evaluation. 2. No significant mass effect or midline shift. These results were called by telephone at the time of interpretation on 03/25/2020 at 1:16 pm to provider [facility] , who verbally acknowledged these results. Electronically Signed   By: Marijo Sanes M.D.   On: 03/25/2020 13:16   CT ANGIO NECK W OR WO CONTRAST  Result Date: 03/28/2020 CLINICAL DATA:  Follow-up venous sinus thrombosis with left parietal venous infarction. EXAM: CT HEAD WITHOUT CONTRAST CT arteriography and venography OF THE HEAD and neck TECHNIQUE: Contiguous axial images were obtained from the base of the skull through the vertex without intravenous contrast. Multidetector CT imaging of the head was performed using the standard protocol during bolus administration of intravenous contrast. Multiplanar CT image reconstructions and MIPs were obtained to evaluate the vascular anatomy. CONTRAST:  43mL OMNIPAQUE IOHEXOL 350 MG/ML SOLN COMPARISON:  Multiple previous exams most recently 03/25/2020 FINDINGS: CT HEAD Brain: Hemorrhagic infarction in the left parietal cortical and subcortical brain redemonstrated. Low-density is slightly increased, particularly along the anterior margin. Hemorrhagic foci are stable with no new or enlarging bleeds. Mild swelling with mass-effect and 1 or 2 mm of left-to-right shift and flattening of the left lateral ventricle. Vascular: See below Skull: Negative Sinuses/Orbits: Clear/normal CTA neck: Aortic arch is normal. Right common carotid artery is widely patent to the bifurcation. Normal carotid bifurcation. Normal cervical ICA. Left common carotid artery widely patent to the bifurcation. Carotid bifurcation is normal. Normal left ICA. Both vertebral arteries widely patent at their origins and through the cervical region to the foramen magnum. CTA HEAD Anterior circulation: Both internal carotid  arteries widely patent through the skull base and siphon regions. The anterior and middle cerebral vessels are normal without proximal stenosis, aneurysm or vascular malformation. Posterior circulation: Both vertebral arteries widely patent to the basilar. No basilar stenosis. Posterior circulation branch vessels are normal. Venous sinuses: See results of CT venography. Anatomic variants: None significant. CT venography of the head Anterior aspect of the superior sagittal sinus is patent. Again demonstrated is partial thrombosis of the superior sagittal sinus at the vertex. This is not occlusive as flow can be demonstrated around the margins of the thrombus. This is fairly segmental, involving a 5-6 cm segment of the sinus. Beyond that, the distal superior sagittal sinus is patent. Both transverse sinuses are patent. Compared to the initial presentation noncontrast CT and MRI, the thrombus does not appear progressive. Thrombosed superficial draining vein on the left again visible, similar in appearance. IMPRESSION: 1. No arterial pathology in the neck. 2. No intracranial large or medium vessel occlusion or correctable proximal stenosis. 3. Partial thrombosis of the superior sagittal sinus at the vertex, not occlusive as flow can be demonstrated around the margins of the thrombus. This is fairly segmental, involving a 5-6 cm segment of the sinus. Beyond  that, the distal superior sagittal sinus is patent. Thrombosed superficial draining vein on the left again visible, similar in appearance. 4. Hemorrhagic infarction in the left parietal cortical and subcortical brain redemonstrated. Low-density edema is slightly increased, particularly along the anterior margin. No new or enlarging bleeds. Mild swelling with mass-effect and 1 or 2 mm of left-to-right shift and flattening of the left lateral ventricle. Electronically Signed   By: Nelson Chimes M.D.   On: 03/28/2020 07:15   MR Brain W and Wo Contrast  Result Date:  03/25/2020 CLINICAL DATA:  Brain mass or lesion. EXAM: MRI HEAD WITHOUT AND WITH CONTRAST TECHNIQUE: Multiplanar, multiecho pulse sequences of the brain and surrounding structures were obtained without and with intravenous contrast. CONTRAST:  35mL GADAVIST GADOBUTROL 1 MMOL/ML IV SOLN COMPARISON:  None. FINDINGS: Brain: Redemonstrated left occipital hemorrhage. Similar surrounding edema with local mass effect and mild rightward midline shift. No evidence of underlying mass lesion although acute blood products limit evaluation. There is a rounded area of arterial enhancement with in the hemorrhage centrally (see series 18, image 109) and superiorly within the hemorrhage (series 18, image 131) which may represent active/ongoing hemorrhage. On postcontrast imaging there is evidence of dural sinus thrombosis within the superior sagittal sinus posteriorly (for example see series 18, images 138 through 151) with thrombosis of a draining cortical vein in the region of hemorrhage (for example see series 18, images 123 through 136). No hydrocephalus. Major arterial flow voids are maintained at the skull base. Skull and upper cervical spine: Normal marrow signal. Sinuses/Orbits: Negative. Other: No mastoid effusions. IMPRESSION: 1. Thrombosis of the posterior aspect of the superior sagittal sinus with hemorrhagic transformation of a venous infarct in the left occipital lobe, as detailed above. There is thrombosis of a draining cortical vein in the region of hemorrhage, as detailed above. 2. There are two rounded areas of enhancement within the left occipital hemorrhage which may represent active/ongoing hemorrhage. 3. When compared to same day CT, similar surrounding edema/mass effect with slight rightward midline shift. Critical Value/emergent results were called by telephone at the time of interpretation on 03/25/2020 at 2:38 pm to provider [facility] , who verbally acknowledged these results. Electronically Signed    By: Margaretha Sheffield MD   On: 03/25/2020 14:51   CT CHEST ABDOMEN PELVIS W CONTRAST  Result Date: 03/27/2020 CLINICAL DATA:  Left parietooccipital lobe venous infarct. EXAM: CT CHEST, ABDOMEN, AND PELVIS WITH CONTRAST TECHNIQUE: Multidetector CT imaging of the chest, abdomen and pelvis was performed following the standard protocol during bolus administration of intravenous contrast. CONTRAST:  170mL OMNIPAQUE IOHEXOL 300 MG/ML  SOLN COMPARISON:  None. FINDINGS: CT CHEST FINDINGS Cardiovascular: The heart size is normal. No substantial pericardial effusion. No thoracic aortic aneurysm. Mediastinum/Nodes: No mediastinal lymphadenopathy. There is no hilar lymphadenopathy. The esophagus has normal imaging features. There is no axillary lymphadenopathy. Lungs/Pleura: Dependent basilar atelectasis noted in both lower lobes. Somewhat confluent opacity in the left lower lobe on 77/7 is probably atelectatic although component of infectious/inflammatory airspace disease not entirely excluded. No pleural effusion. Musculoskeletal: No worrisome lytic or sclerotic osseous abnormality. CT ABDOMEN PELVIS FINDINGS Hepatobiliary: 11 mm hypervascular lesion noted inferior right liver (54/3). Geographic fatty deposition noted in the liver parenchyma with some sparing along the gallbladder fossa. There is no evidence for gallstones, gallbladder wall thickening, or pericholecystic fluid. No intrahepatic or extrahepatic biliary dilation. Pancreas: No focal mass lesion. No dilatation of the main duct. No intraparenchymal cyst. No peripancreatic edema. Spleen: No splenomegaly. No  focal mass lesion. Adrenals/Urinary Tract: No adrenal nodule or mass. Kidneys unremarkable. No evidence for hydroureter. The urinary bladder appears normal for the degree of distention. Stomach/Bowel: Stomach is unremarkable. No gastric wall thickening. No evidence of outlet obstruction. Duodenum is normally positioned as is the ligament of Treitz. No small  bowel wall thickening. No small bowel dilatation. The terminal ileum is normal. The appendix is normal. No gross colonic mass. No colonic wall thickening. Vascular/Lymphatic: No abdominal aortic aneurysm. No abdominal aortic atherosclerotic calcification. The portal vein, superior mesenteric vein and splenic vein are patent. Nonocclusive filling defects are identified in both left renal veins, visible on axial image 59/3 and confirmed on renal delay image 16/8. Both defects are well demonstrated on coronal imaging (see left renal vein on 69/5 and right renal vein on 74/5). Reproductive: Uterus appears heterogeneous with probable posterior left subserosal fibroid. Uterus measures 4.8 x 11.9 x 6.5 cm. There is no adnexal mass. Other: No intraperitoneal free fluid. Musculoskeletal: No worrisome lytic or sclerotic osseous abnormality. IMPRESSION: 1. Nonocclusive thrombus identified in both the left and right renal veins. 2. 11 mm hypervascular lesion noted inferior right liver. This is probably benign and may be a a flash filling hemangioma or vascular malformation. Abdominal MRI without and with contrast recommended to further evaluate. 3. Dependent basilar atelectasis in both lower lobes. Somewhat confluent opacity in the left lower lobe is probably atelectatic although component of infectious/inflammatory airspace disease not entirely excluded. 4. Hepatic steatosis. Electronically Signed   By: Misty Stanley M.D.   On: 03/27/2020 07:25   Chest Port 1 View  Result Date: 03/26/2020 CLINICAL DATA:  Aspiration pneumonia EXAM: PORTABLE CHEST 1 VIEW COMPARISON:  None. FINDINGS: The heart size and mediastinal contours are within normal limits. Hazy airspace opacity seen at the left lung base. There is streaky airspace opacity seen within the right mid lung. The visualized skeletal structures are unremarkable. IMPRESSION: Hazy airspace opacity at the left lung base, likely atelectasis and/or infectious etiology.  Electronically Signed   By: Prudencio Pair M.D.   On: 03/26/2020 00:43   EEG adult  Result Date: 03/26/2020 Lora Havens, MD     03/27/2020  8:43 AM Patient Name: LUX SKILTON MRN: [id number] Epilepsy Attending: Lora Havens Referring Physician/Provider: Dr Amie [place] Duration: 03/25/2020 2318 to 0112, 03/26/2020 0836 to 03/27/2020 0636 Patient history: 44yo F with venous ischemic infarct with hemorrhagic transformation presented with seizure. EEG to evaluate for seizure. Level of alertness: awake, asleep AEDs during EEG study: LEV Technical aspects: This EEG study was done with scalp electrodes positioned according to the 10-20 International system of electrode placement. Electrical activity was acquired at a sampling rate of 500Hz  and reviewed with a high frequency filter of 70Hz  and a low frequency filter of 1Hz . EEG data were recorded continuously and digitally stored. Description: EEG showed posterior dominant rhythm of 9 Hz activity of moderate voltage (25-35 uV) seen predominantly in posterior head regions, symmetric and reactive to eye opening and eye closing. Sleep was characterized by vertex waves, sleep spindles (12-14hz ), maximal frontocentral region. EEG also showed continuous 3-6Hz  theta- delta slowing in left frontotemporal region. Spikes were also seen in bilateral parieto-occipital region. Hyperventilation and photic stimulation were not performed.   ABNORMALITY - Continuous slow, left frontotemporal - Spikes, bilateral parieto-occipital region IMPRESSION: This study showed evidence of epileptogenicity arising from bilateral parieto-occipital region as well as cortical dysfunction in left frontotemporal region likely secondary to underlying hemorrhage. No seizures were seen during this  study. Lora Havens   ECHOCARDIOGRAM COMPLETE  Result Date: 03/26/2020    ECHOCARDIOGRAM REPORT   Patient Name:   EMOJEAN GERTZ Date of Exam: 03/26/2020 Medical Rec #:  [id number]       Height:        59.1 in Accession #:    [id number]      Weight:       140.0 lb Date of Birth:  01-21-1976      BSA:          1.586 m Patient Age:    44 years        BP:           113/59 mmHg Patient Gender: F               HR:           77 bpm. Exam Location:  Inpatient Procedure: 2D Echo Indications:    Stroke I163.9  History:        Patient has no prior history of Echocardiogram examinations.                 Intracerebral Hemorrhage.  Sonographer:    Mikki Santee RDCS (AE) Referring Phys: [id number] [place]  1. Left ventricular ejection fraction, by estimation, is 60 to 65%. The left ventricle has normal function. The left ventricle has no regional wall motion abnormalities. Left ventricular diastolic parameters were normal.  2. Right ventricular systolic function is normal. The right ventricular size is normal. There is normal pulmonary artery systolic pressure. The estimated right ventricular systolic pressure is 30.0 mmHg.  3. The mitral valve is normal in structure. No evidence of mitral valve regurgitation. No evidence of mitral stenosis.  4. The aortic valve is tricuspid. Aortic valve regurgitation is not visualized. No aortic stenosis is present.  5. The inferior vena cava is dilated in size with >50% respiratory variability, suggesting right atrial pressure of 8 mmHg. FINDINGS  Left Ventricle: Left ventricular ejection fraction, by estimation, is 60 to 65%. The left ventricle has normal function. The left ventricle has no regional wall motion abnormalities. The left ventricular internal cavity size was normal in size. There is  no left ventricular hypertrophy. Left ventricular diastolic parameters were normal. Right Ventricle: The right ventricular size is normal. No increase in right ventricular wall thickness. Right ventricular systolic function is normal. There is normal pulmonary artery systolic pressure. The tricuspid regurgitant velocity is 2.01 m/s, and  with an assumed right atrial pressure of 8  mmHg, the estimated right ventricular systolic pressure is 76.2 mmHg. Left Atrium: Left atrial size was normal in size. Right Atrium: Right atrial size was normal in size. Pericardium: There is no evidence of pericardial effusion. Mitral Valve: The mitral valve is normal in structure. No evidence of mitral valve regurgitation. No evidence of mitral valve stenosis. Tricuspid Valve: The tricuspid valve is normal in structure. Tricuspid valve regurgitation is trivial. Aortic Valve: The aortic valve is tricuspid. Aortic valve regurgitation is not visualized. No aortic stenosis is present. Pulmonic Valve: The pulmonic valve was normal in structure. Pulmonic valve regurgitation is not visualized. Aorta: The aortic root is normal in size and structure. Venous: The inferior vena cava is dilated in size with greater than 50% respiratory variability, suggesting right atrial pressure of 8 mmHg. IAS/Shunts: No atrial level shunt detected by color flow Doppler.  LEFT VENTRICLE PLAX 2D LVIDd:         4.50 cm  Diastology LVIDs:  3.10 cm  LV e' medial:    12.20 cm/s LV PW:         0.90 cm  LV E/e' medial:  6.3 LV IVS:        0.70 cm  LV e' lateral:   8.59 cm/s LVOT diam:     1.90 cm  LV E/e' lateral: 8.9 LV SV:         65 LV SV Index:   41 LVOT Area:     2.84 cm  RIGHT VENTRICLE RV S prime:     13.40 cm/s TAPSE (M-mode): 1.7 cm LEFT ATRIUM             Index       RIGHT ATRIUM           Index LA diam:        3.40 cm 2.14 cm/m  RA Area:     11.80 cm LA Vol (A2C):   49.1 ml 30.96 ml/m RA Volume:   23.10 ml  14.57 ml/m LA Vol (A4C):   32.1 ml 20.24 ml/m LA Biplane Vol: 41.8 ml 26.36 ml/m  AORTIC VALVE LVOT Vmax:   123.00 cm/s LVOT Vmean:  86.500 cm/s LVOT VTI:    0.230 m  AORTA Ao Root diam: 2.70 cm MITRAL VALVE               TRICUSPID VALVE MV Area (PHT): 2.34 cm    TR Peak grad:   16.2 mmHg MV Decel Time: 324 msec    TR Vmax:        201.00 cm/s MV E velocity: 76.50 cm/s MV A velocity: 44.10 cm/s  SHUNTS MV E/A ratio:   1.73        Systemic VTI:  0.23 m                            Systemic Diam: 1.90 cm Loralie Champagne MD Electronically signed by Loralie Champagne MD Signature Date/Time: 03/26/2020/5:01:38 PM    Final    CT VENOGRAM HEAD  Result Date: 03/28/2020 CLINICAL DATA:  Follow-up venous sinus thrombosis with left parietal venous infarction. EXAM: CT HEAD WITHOUT CONTRAST CT arteriography and venography OF THE HEAD and neck TECHNIQUE: Contiguous axial images were obtained from the base of the skull through the vertex without intravenous contrast. Multidetector CT imaging of the head was performed using the standard protocol during bolus administration of intravenous contrast. Multiplanar CT image reconstructions and MIPs were obtained to evaluate the vascular anatomy. CONTRAST:  70mL OMNIPAQUE IOHEXOL 350 MG/ML SOLN COMPARISON:  Multiple previous exams most recently 03/25/2020 FINDINGS: CT HEAD Brain: Hemorrhagic infarction in the left parietal cortical and subcortical brain redemonstrated. Low-density is slightly increased, particularly along the anterior margin. Hemorrhagic foci are stable with no new or enlarging bleeds. Mild swelling with mass-effect and 1 or 2 mm of left-to-right shift and flattening of the left lateral ventricle. Vascular: See below Skull: Negative Sinuses/Orbits: Clear/normal CTA neck: Aortic arch is normal. Right common carotid artery is widely patent to the bifurcation. Normal carotid bifurcation. Normal cervical ICA. Left common carotid artery widely patent to the bifurcation. Carotid bifurcation is normal. Normal left ICA. Both vertebral arteries widely patent at their origins and through the cervical region to the foramen magnum. CTA HEAD Anterior circulation: Both internal carotid arteries widely patent through the skull base and siphon regions. The anterior and middle cerebral vessels are normal without proximal stenosis, aneurysm or vascular malformation. Posterior circulation:  Both vertebral  arteries widely patent to the basilar. No basilar stenosis. Posterior circulation branch vessels are normal. Venous sinuses: See results of CT venography. Anatomic variants: None significant. CT venography of the head Anterior aspect of the superior sagittal sinus is patent. Again demonstrated is partial thrombosis of the superior sagittal sinus at the vertex. This is not occlusive as flow can be demonstrated around the margins of the thrombus. This is fairly segmental, involving a 5-6 cm segment of the sinus. Beyond that, the distal superior sagittal sinus is patent. Both transverse sinuses are patent. Compared to the initial presentation noncontrast CT and MRI, the thrombus does not appear progressive. Thrombosed superficial draining vein on the left again visible, similar in appearance. IMPRESSION: 1. No arterial pathology in the neck. 2. No intracranial large or medium vessel occlusion or correctable proximal stenosis. 3. Partial thrombosis of the superior sagittal sinus at the vertex, not occlusive as flow can be demonstrated around the margins of the thrombus. This is fairly segmental, involving a 5-6 cm segment of the sinus. Beyond that, the distal superior sagittal sinus is patent. Thrombosed superficial draining vein on the left again visible, similar in appearance. 4. Hemorrhagic infarction in the left parietal cortical and subcortical brain redemonstrated. Low-density edema is slightly increased, particularly along the anterior margin. No new or enlarging bleeds. Mild swelling with mass-effect and 1 or 2 mm of left-to-right shift and flattening of the left lateral ventricle. Electronically Signed   By: Nelson Chimes M.D.   On: 03/28/2020 07:15       HISTORY OF PRESENT ILLNESS Ranata I Currier is a 44 y.o. female with no PMH, presented to [facility] with complaints of headache and neck stiffness.  A code stroke was activated-she was seen by Dr. Curly [place].  She reported that headaches  had been going on for 3 days with associated neck stiffness.  Headaches did wake her up at nights.  Headaches were better laying down and worsened when standing up.  Also complained of blurred and double vision mostly looking to the right.  No tinnitus.  No fevers chills cough cold rashes urinary symptoms bowel or bladder incontinence.  No bleeding or clotting disorders.  No family history of clotting disorders.  Not sexually active.  Not taking any medications other than the pain medications headache. Was vaccinated against COVID-19 in March 2021.   Brain imaging revealed ICH which is close to the superior left parietal lobe in proximity with the venous drainage making suspicion for a venous infarct that might have bled. MRI brain confirmed the bleed and also revealed a cerebral venous thrombosis on superior sagittal sinus higher up with a thrombus also in the draining cortical vein.   Transferred from the ER at [facility] to ER at [facility] as no ICU beds readily available. Dr. Rory Percy was asked to see and admit the patient.    As she was being evaluated by the ED providers in the ED at [facility], she had a witnessed tonic-clonic seizure for which she received 2 mg of Ativan and was loaded with Keppra.  On initial examination by ED providers , she was not consistently following commands and was drowsy.  Examination at [facility] regional was essentially unremarkable and nearly normal.    Repeat head CT showed essentially stable hemorrhagic transformed venous infarction in the left parietal region with a possible single new focus of hemorrhage at the vertex along with somewhat hyperdense superior sagittal sinus.  She was LKW 3 days  prior to admission. mRS: 0    HOSPITAL COURSE Ms. Sivan I Prue is a 44 y.o. female with no PMH presenting ro [facility] with HA and neck stiffness.    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 Also thrombosis draining cortical vein. 2 areas enhancement in L occipital hemorrhage possible ongoing hemorrhage. Similar edema w/ slight R shift. CT head 10/6 2028 venous infarct w/ hemorrhagic transformation L parietal slight increase edema, most hemorrhage foci unchanged. Single new hemorrhage at vertex. Hyperdense sagittal sinus (thrombosis). CT head 10/6 2251 stable Pan CT neg for malignancy, B renal vein nonocclusive thrombosis. CT Venogram and CTA H&N - No arterial pathology in the neck. No intracranial large or medium vessel occlusion or correctable proximal stenosis. Partial thrombosis of the superior sagittal sinus at the vertex, not occlusive as flow can be demonstrated around the margins of the thrombus. This is fairly segmental, involving a 5-6 cm segment of the sinus. Beyond that, the distal superior sagittal sinus is patent. Thrombosed superficial draining vein on the left again visible, similar in appearance. Hemorrhagic infarction in the left parietal cortical and subcortical brain redemonstrated. Low-density edema is slightly increased, particularly along the anterior margin. No new or enlarging bleeds. Mild swelling with mass-effect and 1 or 2 mm of left-to-right shift and flattening of the left lateral ventricle. 2D Echo EF 60-65%. No source of embolus  LDL UTC, TC 210, TG 132, direct LDL 151.9 HgbA1c 5.6 UDS +benzos Hypercoagulable labs  Anticardiolipin IgM 19 (indeterminate) (prot S total and activity mildly low - not accurate on heparin) VTE prophylaxis - heparin IV  No antithrombotic prior to admission, now on heparin IV and warfarin daily. INR 1.8 Therapy recommendations:  CIR Disposition:  CIR0  Seizure  GTC seizure in ER  S/p ativan and  keppra load Continue keppra 500 bid LTM EEG - L frontotemporal continuous slowing, B parieto-occipital spikes  D/c LTM EEG Seizure precautions According to [place] law, pt can not drive until seizure free for 6 months and under physician's care.    ?? 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 OCP use Hypercoagulable labs  Anticardiolipin IgM 19 (indeterminate) (prot S total and activity mildly low - not accurate on heparin) On heparin IV INR 1.8   OCP use On OTC 28 for contraception  Not smoker Recommend to avoid OCP use, especially estrogen use   Hyperlipidemia LDL UTC, TC 210, TG 132, direct LDL 151.9 on no statin PTA Add lipitor 40 Continue lipitor on discharge    Other Stroke Risk Factors Hx ETOH use Overweight, Body mass index is 28.27 kg/m., recommend weight loss, diet and exercise as appropriate    Other Active Problems Anemia, Hgb 9.2->8.6->7.8->7.9->7.6->8.2->8.2->8.3 - monitor - starting warfarin.  Leukocytosis WBC 9.9->13.9->8.9 UA neg -> 8.0->7.4->7.1->6.3 - resolved Hypokalemia, K 3.2->3.9->3.7->3.3->3.6->4.0->3.7 - resolved  DISCHARGE EXAM Blood pressure 115/64, pulse 69, temperature 99.4 F (37.4 C), temperature source Oral, resp. rate 18, height 4\' 11"  (1.499 m), weight 63.5 kg, SpO2 100 %. General - Well nourished, well developed middle-aged Hispanic lady, in no apparent distress.   Ophthalmologic - fundi not visualized due to noncooperation.   Cardiovascular - Regular rhythm and rate.   Mental Status -  Level of arousal and  orientation to time, place, and person were intact. Language including expression, naming, repetition, comprehension was assessed and found intact. Fund of Knowledge was assessed and was intact.   Cranial Nerves II - XII - II - Visual field shows mild right hemianopsia.   III, IV, VI - Extraocular movements intact. V - Facial sensation intact bilaterally. VII - Facial  movement intact bilaterally. VIII - Hearing & vestibular intact bilaterally. X - Palate elevates symmetrically. XI - Chin turning & shoulder shrug intact bilaterally. XII - Tongue protrusion intact.   Motor Strength - The patients strength was normal in all extremities and pronator drift was absent.  Bulk was normal and fasciculations were absent.   Motor Tone - Muscle tone was assessed at the neck and appendages and was normal.   Reflexes - The patients reflexes were symmetrical in all extremities and she had no pathological reflexes.   Sensory - Light touch, temperature/pinprick were assessed and were symmetrical.     Coordination - The patient had normal movements in the hands with no ataxia or dysmetria.  Tremor was absent.   Gait and Station - deferred.   Discharge Diet   Regular thin liquids  DISCHARGE PLAN Disposition:  Transfer to [place] for ongoing PT, OT and ST heparin IV and warfarin daily for secondary stroke prevention. INR 1.8. continue IV heparin as bridge to therapeutic INR goal 2-3 No hormones, life long  Recommend ongoing stroke risk factor control by Primary Care Physician at time of discharge from inpatient rehabilitation. Follow-up PCP Center, [place] in 2 weeks following discharge from rehab. Follow-up in [facility] in 4 weeks following discharge from rehab, office to schedule an appointment.   35 minutes were spent preparing discharge.  Burnetta Sabin, MSN, APRN, ANVP-BC, AGPCNP-BC Advanced Practice Stroke Nurse [place] for Schedule & Pager information 03/31/2020 11:55 AM  I have personally obtained history,examined this patient, reviewed notes, independently viewed imaging studies, participated in medical decision making and plan of care.ROS completed by me personally and pertinent positives fully documented  I have made any additions or clarifications  directly to the above note. Agree with note above.    Antony Contras, MD Medical Director [facility] Pager: [phone] 03/31/2020 2:46 PM

## 2020-03-31 NOTE — Progress Notes (Addendum)
ANTICOAGULATION CONSULT NOTE - Follow Up Consult  Pharmacy Consult for Heparin and Coumadin Indication: Dural venous sinus thrombosis  No Known Allergies  Patient Measurements: Height: 4\' 11"  (149.9 cm) Weight: 63.5 kg (139 lb 15.9 oz) IBW/kg (Calculated) : 43.2 Heparin Dosing Weight:   Vital Signs: Temp: 98 F (36.7 C) (10/12 0350) Temp Source: Oral (10/12 0350) BP: 107/65 (10/12 0350) Pulse Rate: 54 (10/12 0350)  Labs: Recent Labs    03/29/20 0608 03/29/20 0818 03/30/20 0046 03/30/20 1524 03/31/20 0323  HGB 8.2*   < > 8.2*  --  8.3*  HCT 27.9*  --  27.7*  --  27.9*  PLT 362  --  347  --  377  LABPROT 16.1*  --  19.1*  --  20.3*  INR 1.3*  --  1.7*  --  1.8*  HEPARINUNFRC 1.02*   < > 0.30 0.27* 0.50  CREATININE 0.67  --  0.67  --  0.70   < > = values in this interval not displayed.    Estimated Creatinine Clearance: 71.9 mL/min (by C-G formula based on SCr of 0.7 mg/dL).   Assessment:  Anticoag: Heparin for venous sinus thrombosis with hemorrhagic transformation + Bilateral renal vein nonocclusive thrombosis, low goal and no bolus. Started at [facility] - Hep level 0.5 (in range), INR 1.1>1.3>1.7>1.8, Hgb 8.3  Goal of Therapy:  Heparin level 0.3-0.5 units/ml Monitor platelets by anticoagulation protocol: Yes INR 2-3   Plan:  Continue heparin at 650 units/hr Recheck confirmatory heparin level. Coumadin 4 mg po x 1 tonight Daily HL, CBC, and INR   Aniqua Briere A. Levada Dy, PharmD, BCPS, FNKF Clinical Pharmacist [place] Please utilize Amion for appropriate phone number to reach the unit pharmacist ([place])   03/31/2020,7:36 AM   Addendum: confirmatory heparin level 0.37 on recheck. Still in range. Continue current rate.   Star Cheese A. Levada Dy, PharmD, BCPS, FNKF Clinical Pharmacist [place] Please utilize Amion for appropriate phone number to reach the unit pharmacist ([place])

## 2020-03-31 NOTE — Progress Notes (Signed)
Inpatient Rehabilitation Medication Review by a Pharmacist  A complete drug regimen review was completed for this patient to identify any potential clinically significant medication issues.  Clinically significant medication issues were identified:  no  Check AMION for pharmacist assigned to patient if future medication questions/issues arise during this admission.  Pharmacist comments:   Time spent performing this drug regimen review (minutes):  [place], PharmD, BCPS Please check AMION for all [place] contact numbers Clinical Pharmacist 03/31/2020 5:20 PM

## 2020-03-31 NOTE — Plan of Care (Signed)
Pt alert and oriented. Concerns over night low HR (40's) and dizziness. Heparin at 6.5 IV. Pt is on RA. Right side weakness.   Problem: Education: Goal: Knowledge of disease or condition will improve Outcome: Progressing Goal: Knowledge of secondary prevention will improve Outcome: Progressing Goal: Knowledge of patient specific risk factors addressed and post discharge goals established will improve Outcome: Progressing Goal: Individualized Educational Video(s) Outcome: Progressing   Problem: Coping: Goal: Will verbalize positive feelings about self Outcome: Progressing Goal: Will identify appropriate support needs Outcome: Progressing   Problem: Health Behavior/Discharge Planning: Goal: Ability to manage health-related needs will improve Outcome: Progressing   Problem: Self-Care: Goal: Ability to participate in self-care as condition permits will improve Outcome: Progressing Goal: Verbalization of feelings and concerns over difficulty with self-care will improve Outcome: Progressing   Problem: Nutrition: Goal: Risk of aspiration will decrease Outcome: Progressing Goal: Dietary intake will improve Outcome: Progressing   Problem: Intracerebral Hemorrhage Tissue Perfusion: Goal: Complications of Intracerebral Hemorrhage will be minimized Outcome: Progressing   Problem: Education: Goal: Knowledge of General Education information will improve Description: Including pain rating scale, medication(s)/side effects and non-pharmacologic comfort measures Outcome: Progressing   Problem: Health Behavior/Discharge Planning: Goal: Ability to manage health-related needs will improve Outcome: Progressing   Problem: Clinical Measurements: Goal: Ability to maintain clinical measurements within normal limits will improve Outcome: Progressing Goal: Will remain free from infection Outcome: Progressing Goal: Diagnostic test results will improve Outcome: Progressing Goal: Respiratory  complications will improve Outcome: Progressing Goal: Cardiovascular complication will be avoided Outcome: Progressing   Problem: Activity: Goal: Risk for activity intolerance will decrease Outcome: Progressing   Problem: Nutrition: Goal: Adequate nutrition will be maintained Outcome: Progressing   Problem: Coping: Goal: Level of anxiety will decrease Outcome: Progressing   Problem: Elimination: Goal: Will not experience complications related to bowel motility Outcome: Progressing Goal: Will not experience complications related to urinary retention Outcome: Progressing   Problem: Pain Managment: Goal: General experience of comfort will improve Outcome: Progressing   Problem: Safety: Goal: Ability to remain free from injury will improve Outcome: Progressing   Problem: Skin Integrity: Goal: Risk for impaired skin integrity will decrease Outcome: Progressing   Problem: Education: Goal: Expressions of having a comfortable level of knowledge regarding the disease process will increase Outcome: Progressing   Problem: Coping: Goal: Ability to adjust to condition or change in health will improve Outcome: Progressing Goal: Ability to identify appropriate support needs will improve Outcome: Progressing   Problem: Health Behavior/Discharge Planning: Goal: Compliance with prescribed medication regimen will improve Outcome: Progressing   Problem: Medication: Goal: Risk for medication side effects will decrease Outcome: Progressing   Problem: Clinical Measurements: Goal: Complications related to the disease process, condition or treatment will be avoided or minimized Outcome: Progressing Goal: Diagnostic test results will improve Outcome: Progressing   Problem: Safety: Goal: Verbalization of understanding the information provided will improve Outcome: Progressing   Problem: Self-Concept: Goal: Level of anxiety will decrease Outcome: Progressing Goal: Ability to  verbalize feelings about condition will improve Outcome: Progressing

## 2020-03-31 NOTE — H&P (Signed)
[facility]   Physical Medicine and Rehabilitation Admission H&P    Chief Complaint  Patient presents with  . Headache  : HPI: Erica Rosario is a 44 year old right-handed female unremarkable past medical history no prescription medications.  Per chart review lives with spouse independent prior to admission.  Multilevel home.  Entry [facility] hospital 03/[facility]/2020 with headache and stiffness of neck as well as blurred vision.  Patient had been vaccinated COVID-06 September 2019 there was reported seizure in the ED.  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  There is a thrombosis of a draining cortical vein in the region of hemorrhage.  2 rounded areas of enhancement within the left occipital hemorrhage possibly representing active ongoing hemorrhage.  CT angiogram of head and neck no arterial pathology of the neck.  No intracranial large or medium vessel occlusion.  Admission chemistries potassium 3.2, AST 48, ALT 55, hemoglobin 9.2.  EEG negative for seizure.  Echocardiogram with ejection fraction of 60 to 65% no wall motion abnormalities.  Neurology follow-up placed on Keppra for seizure prophylaxis.  Intravenous heparin initiated transitioned to Coumadin.  Therapy evaluations completed and patient was admitted for a comprehensive rehab program  Review of Systems  Constitutional: Negative for chills and fever.  HENT: Negative for hearing loss.   Eyes: Positive for blurred vision.  Respiratory: Negative for cough and shortness of breath.   Cardiovascular: Negative for chest pain, palpitations and leg swelling.  Gastrointestinal: Positive for constipation and nausea.  Genitourinary: Negative for dysuria, flank pain and hematuria.  Musculoskeletal: Positive for myalgias.  Skin: Negative for rash.  Neurological: Positive for headaches.  All other systems reviewed and are  negative.  History reviewed. No pertinent past medical history. No past surgical history on file. No family history on file. Social History:  reports that she has never smoked. She has never used smokeless tobacco. She reports previous alcohol use. She reports that she does not use drugs. Allergies: No Known Allergies No medications prior to admission.    Drug Regimen Review Drug regimen was reviewed and remains appropriate with no significant issues identified  Home: Home Living Family/patient expects to be discharged to:: Private residence Living Arrangements: Spouse/significant other Available Help at Discharge: Family, Available 24 hours/day Type of Home: House Home Access: Level entry Home Layout: One level Bathroom Shower/Tub: Tub/shower unit, Architectural technologist: Standard Home Equipment: Grab bars - tub/shower, Hand held shower head, Shower seat, Grab bars - toilet  Lives With: Spouse   Functional History: Prior Function Level of Independence: Independent Comments: works sewing  Functional Status:  Mobility: Bed Mobility Overal bed mobility: Independent Bed Mobility: Supine to Sit, Sit to Supine Supine to sit: Supervision Sit to supine: Supervision General bed mobility comments: Bed flat, pt utilizing end of bed rail to pull trunk more anteriorly and to push herself towards EOB.  Transfers Overall transfer level: Needs assistance Equipment used: None Transfers: Sit to/from Stand Sit to Stand: Min guard Stand pivot transfers: Min assist, +2 safety/equipment General transfer comment: Pt able to come to stand with slight unsteadiness noted upon coming to stand resulting in the pt requiring a brief moment prior to ambulating. MinA for safety reasons. Ambulation/Gait Ambulation/Gait assistance: Min assist Gait Distance (Feet): 220 Feet Assistive device: IV Pole Gait Pattern/deviations: Step-through pattern, Decreased stride length, Shuffle, Drifts  right/left General Gait Details: Pt pushed IV pole in R hand during gait, and  despite cues to bring IV pole more lateral to pt she continued to place it proximal to R foot. Required assistance to manage IV pole, esp with turns, and minA and intermittent modA for balance with her drifting towards the R on occasion. Gait velocity: reduced Gait velocity interpretation: <1.31 ft/sec, indicative of household ambulator    ADL: ADL Overall ADL's : Needs assistance/impaired Eating/Feeding: Supervision/ safety, Sitting Eating/Feeding Details (indicate cue type and reason): due to vision Grooming: Set up, Supervision/safety, Sitting Grooming Details (indicate cue type and reason): due to vision Upper Body Bathing: Set up, Supervision/ safety, Sitting Upper Body Bathing Details (indicate cue type and reason): due to vision Lower Body Bathing: Minimal assistance, Sit to/from stand Upper Body Dressing : Supervision/safety, Set up, Sitting Upper Body Dressing Details (indicate cue type and reason): due to vision Lower Body Dressing: Minimal assistance, Sit to/from stand Toilet Transfer: Minimal assistance, Ambulation Toilet Transfer Details (indicate cue type and reason): bed>recliner>door>back to recliner (slow pace due to decreased balance) Toileting- Clothing Manipulation and Hygiene: Minimal assistance, Sit to/from stand  Cognition: Cognition Overall Cognitive Status: Impaired/Different from baseline Arousal/Alertness: Awake/alert Orientation Level: Oriented X4 Attention: Focused, Sustained, Selective Focused Attention: Appears intact Sustained Attention: Appears intact Selective Attention: Impaired Memory: Impaired Memory Impairment: Storage deficit, Retrieval deficit Awareness: Appears intact Problem Solving: Impaired Problem Solving Impairment: Verbal basic, Functional basic Safety/Judgment: Appears intact Cognition Arousal/Alertness: Awake/alert Behavior During Therapy: WFL for  tasks assessed/performed Overall Cognitive Status: Impaired/Different from baseline Area of Impairment: Safety/judgement, Awareness Safety/Judgement: Decreased awareness of safety Awareness: Emergent Problem Solving: Requires verbal cues General Comments: Pt working on strategies for visual compensation in pen and paper tasks  Physical Exam: Blood pressure 115/64, pulse 69, temperature 99.4 F (37.4 C), temperature source Oral, resp. rate 18, height 4\' 11"  (1.499 m), weight 63.5 kg, SpO2 100 %. Physical Exam  General: Alert and oriented x 2 (unable to state date, but can state month and day of week), No apparent distress HEENT: Head is normocephalic, atraumatic, PERRLA, EOMI, sclera anicteric, oral mucosa pink and moist, dentition intact, ext ear canals clear,  Neck: Supple without JVD or lymphadenopathy Heart: Reg rate and rhythm. No murmurs rubs or gallops Chest: CTA bilaterally without wheezes, rales, or rhonchi; no distress Abdomen: Soft, non-tender, non-distended, bowel sounds positive. Extremities: No clubbing, cyanosis, or edema. Pulses are 2+ Skin: Clean and intact without signs of breakdown Neuro: Patient is alert.  Makes eye contact with examiner.  She does have some impaired memory but was able to provide her name and place.  Follows simple commands. 5/5 strength throughout   Psych: Pt's affect is appropriate. Pt is cooperative   Results for orders placed or performed during the hospital encounter of 03/25/20 (from the past 48 hour(s))  Heparin level (unfractionated)     Status: None   Collection Time: 03/29/20  2:28 PM  Result Value Ref Range   Heparin Unfractionated 0.65 0.30 - 0.70 IU/mL    Comment: (NOTE) If heparin results are below expected values, and patient dosage has  been confirmed, suggest follow up testing of antithrombin III levels. Performed at [facility], [place] [address], [place], [place] [address]   Basic metabolic panel     Status: Abnormal    Collection Time: 03/30/20 12:46 AM  Result Value Ref Range   Sodium 139 135 - 145 mmol/L   Potassium 4.0 3.5 - 5.1 mmol/L   Chloride 109 98 - 111 mmol/L   CO2 19 (L) 22 - 32 mmol/L  Glucose, Bld 91 70 - 99 mg/dL    Comment: Glucose reference range applies only to samples taken after fasting for at least 8 hours.   BUN <5 (L) 6 - 20 mg/dL   Creatinine, Ser 0.67 0.44 - 1.00 mg/dL   Calcium 8.7 (L) 8.9 - 10.3 mg/dL   GFR, Estimated >60 >60 mL/min   Anion gap 11 5 - 15    Comment: Performed at [facility] [address] [place], [place] [address]  CBC     Status: Abnormal   Collection Time: 03/30/20 12:46 AM  Result Value Ref Range   WBC 7.1 4.0 - 10.5 K/uL   RBC 3.72 (L) 3.87 - 5.11 MIL/uL   Hemoglobin 8.2 (L) 12.0 - 15.0 g/dL    Comment: Reticulocyte Hemoglobin testing may be clinically indicated, consider ordering this additional test [id number]    HCT 27.7 (L) 36 - 46 %   MCV 74.5 (L) 80.0 - 100.0 fL   MCH 22.0 (L) 26.0 - 34.0 pg   MCHC 29.6 (L) 30.0 - 36.0 g/dL   RDW 18.8 (H) 11.5 - 15.5 %   Platelets 347 150 - 400 K/uL   nRBC 0.0 0.0 - 0.2 %    Comment: Performed at [facility], [place] [address] [place], [place] [address]  Protime-INR     Status: Abnormal   Collection Time: 03/30/20 12:46 AM  Result Value Ref Range   Prothrombin Time 19.1 (H) 11.4 - 15.2 seconds   INR 1.7 (H) 0.8 - 1.2    Comment: (NOTE) INR goal varies based on device and disease states. Performed at [facility], [place] [address], [place], [place] [address]   Heparin level (unfractionated)     Status: None   Collection Time: 03/30/20 12:46 AM  Result Value Ref Range   Heparin Unfractionated 0.30 0.30 - 0.70 IU/mL    Comment: (NOTE) If heparin results are below expected values, and patient dosage has  been confirmed, suggest follow up testing of antithrombin III levels. Performed at [facility], [place] [address], [place], [place] [address]   Heparin level (unfractionated)      Status: Abnormal   Collection Time: 03/30/20  3:24 PM  Result Value Ref Range   Heparin Unfractionated 0.27 (L) 0.30 - 0.70 IU/mL    Comment: (NOTE) If heparin results are below expected values, and patient dosage has  been confirmed, suggest follow up testing of antithrombin III levels. Performed at [facility], [place] [address] [place], [place] [address]   Basic metabolic panel     Status: Abnormal   Collection Time: 03/31/20  3:23 AM  Result Value Ref Range   Sodium 140 135 - 145 mmol/L   Potassium 3.7 3.5 - 5.1 mmol/L   Chloride 109 98 - 111 mmol/L   CO2 22 22 - 32 mmol/L   Glucose, Bld 84 70 - 99 mg/dL    Comment: Glucose reference range applies only to samples taken after fasting for at least 8 hours.   BUN <5 (L) 6 - 20 mg/dL   Creatinine, Ser 0.70 0.44 - 1.00 mg/dL   Calcium 8.9 8.9 - 10.3 mg/dL   GFR, Estimated >60 >60 mL/min   Anion gap 9 5 - 15    Comment: Performed at [facility] [address], [place], [place] [address]  CBC     Status: Abnormal   Collection Time: 03/31/20  3:23 AM  Result Value  Ref Range   WBC 6.3 4.0 - 10.5 K/uL   RBC 3.76 (L) 3.87 - 5.11 MIL/uL   Hemoglobin 8.3 (L) 12.0 - 15.0 g/dL    Comment: Reticulocyte Hemoglobin testing may be clinically indicated, consider ordering this additional test [id number]    HCT 27.9 (L) 36 - 46 %   MCV 74.2 (L) 80.0 - 100.0 fL   MCH 22.1 (L) 26.0 - 34.0 pg   MCHC 29.7 (L) 30.0 - 36.0 g/dL   RDW 18.7 (H) 11.5 - 15.5 %   Platelets 377 150 - 400 K/uL   nRBC 0.3 (H) 0.0 - 0.2 %    Comment: Performed at [facility], [place] [address] [place], [place] [address]  Heparin level (unfractionated)     Status: None   Collection Time: 03/31/20  3:23 AM  Result Value Ref Range   Heparin Unfractionated 0.50 0.30 - 0.70 IU/mL    Comment: (NOTE) If heparin results are below expected values, and patient dosage has  been confirmed, suggest follow up testing of antithrombin III levels. Performed at  [facility], [place] [address], [place], [place] [address]   Protime-INR     Status: Abnormal   Collection Time: 03/31/20  3:23 AM  Result Value Ref Range   Prothrombin Time 20.3 (H) 11.4 - 15.2 seconds   INR 1.8 (H) 0.8 - 1.2    Comment: (NOTE) INR goal varies based on device and disease states. Performed at [facility], [place] [address], [place], [place] [address]    No results found.     Medical Problem List and Plan: 1.  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  Antithrombotics: -DVT/anticoagulation: Coumadin  -antiplatelet therapy: N/A 3. Pain Management: Fioricet as needed. Denies pain 4. Mood: Provide emotional support  -antipsychotic agents: N/A 5. Neuropsych: This patient is capable of making decisions on her own behalf. 6. Skin/Wound Care: Routine skin checks 7. Fluids/Electrolytes/Nutrition: Routine in and outs with follow-up chemistries 8.  Seizure prophylaxis.  Keppra 500 mg twice daily. Denies seizures.  9.  Hyperlipidemia.  Lipitor 10. Disposition: Lives with husband and two sons aged 4 and 15. Husband can take time off work if needed to care for her.    Lavon Paganini Angiulli, PA-C 03/31/2020   I have personally performed a face to face diagnostic evaluation, including, but not limited to relevant history and physical exam findings, of this patient and developed relevant assessment and plan.  Additionally, I have reviewed and concur with the physician assistant's documentation above.  Leeroy Cha, MD

## 2020-03-31 NOTE — Progress Notes (Signed)
Physical Therapy Treatment Patient Details Name: Erica Rosario MRN: [id number] DOB: 1976/05/08 Today's Date: 03/31/2020    History of Present Illness Erica Rosario is a 44 y.o. female no PMH, presented to [facility] hospital this afternoon with complaints of headache and neck stiffness.Brain imaging revealed ICH which is close to the superior left parietal lobe in proximity with the venous drainage making suspicion for a venous infarct that might have bled.MRI brain confirmed the bleed and also revealed a cerebral venous thrombosis on superior sagittal sinus higher up with a thrombus also in the draining cortical vein.    PT Comments    Pt making progress towards her goals as she attempted to negotiate stairs this date. She demonstrates difficulty sequencing and poor memory with which LE to lead with when ascending and descending, despite continuous cues. She required minA and B HHA on therapist or RW to ascend and modA with B UE support to descend a transportable single step, x4 reps. CIR continues to be an appropriate recommendation, and will continue to follow acutely to maximize her safety and independence with functional mobility.  Follow Up Recommendations  CIR;Supervision/Assistance - 24 hour     Equipment Recommendations  3in1 (PT);Rolling walker with 5" wheels (TBD as pt progresses)    Recommendations for Other Services Rehab consult     Precautions / Restrictions Precautions Precautions: Fall Precaution Comments: R sided vision deficits, Spanish interpreter needed Restrictions Weight Bearing Restrictions: No    Mobility  Bed Mobility Overal bed mobility: Modified Independent Bed Mobility: Supine to Sit;Sit to Supine     Supine to sit: Modified independent (Device/Increase time);HOB elevated Sit to supine: Modified independent (Device/Increase time);HOB elevated   General bed mobility comments: HOB elevated, able to transition supine <> sit EOB in  appropriate amount of time safely.  Transfers Overall transfer level: Needs assistance Equipment used: None Transfers: Sit to/from Stand Sit to Stand: Min assist         General transfer comment: Pt able to come to stand with slight unsteadiness noted upon coming to stand resulting in the pt requiring a brief moment prior to ambulating. MinA for safety reasons.  Ambulation/Gait Ambulation/Gait assistance: Min assist Gait Distance (Feet): 10 Feet Assistive device: 1 person hand held assist;None Gait Pattern/deviations: Step-through pattern;Decreased stride length;Shuffle Gait velocity: reduced Gait velocity interpretation: <1.31 ft/sec, indicative of household ambulator General Gait Details: Ambulates at slow speed, looking down at feet. Poor B feet clearance as she is shuffling with decreased stride length this date, slight unsteadiness noted. Assistance for balance and to manage lines.   Stairs Stairs: Yes Stairs assistance: Min assist;Mod assist Stair Management: Step to pattern;With walker (HHA) Number of Stairs: 4 General stair comments: Initial 2 steps utilized RW placed over top of transportable single step for B UE support and safety. Cued pt to lead with L when ascending and lead with R when descending, requriing reminders as she would still attempt to step down with L despite cues. Unsteadiness, especially with descending noted, thereby requiring modA to descend and minA to ascend. During final 2 steps, required B HHA on therapist for [place]. Minor LOB noted with modA recover.   Wheelchair Mobility    Modified Rankin (Stroke Patients Only) Modified Rankin (Stroke Patients Only) Pre-Morbid Rankin Score: No symptoms Modified Rankin: Moderately severe disability     Balance Overall balance assessment: Needs assistance Sitting-balance support: Feet supported;No upper extremity supported Sitting balance-Leahy Scale: Good Sitting balance - Comments: Able to sit EOB  statically  without UE support without LOB.   Standing balance support: Single extremity supported;No upper extremity supported Standing balance-Leahy Scale: Fair Standing balance comment: Unsteadiness noted with standing mobility, requiring assistance secondary to vision deficits and dizziness.                            Cognition Arousal/Alertness: Awake/alert Behavior During Therapy: WFL for tasks assessed/performed Overall Cognitive Status: Impaired/Different from baseline Area of Impairment: Memory;Safety/judgement;Awareness;Problem solving                     Memory: Decreased recall of precautions;Decreased short-term memory   Safety/Judgement: Decreased awareness of safety Awareness: Emergent Problem Solving: Requires verbal cues General Comments: Required reminders on which LE to lead with when ascendign and descending stairs as she continued to attempt to choose incorrect LE. Cued pt to look twards her R to ensure safety as she has visual deficits to that side.      Exercises      General Comments General comments (skin integrity, edema, etc.): Utilized translator: Rip Harbour [phone]      Pertinent Vitals/Pain Pain Assessment: No/denies pain Pain Intervention(s): Monitored during session    Home Living                      Prior Function            PT Goals (current goals can now be found in the care plan section) Acute Rehab PT Goals Patient Stated Goal: to get better and go home PT Goal Formulation: With patient/family Time For Goal Achievement: 04/10/20 Potential to Achieve Goals: Good Progress towards PT goals: Progressing toward goals    Frequency    Min 4X/week      PT Plan Current plan remains appropriate    Co-evaluation     PT goals addressed during session: Mobility/safety with mobility        AM-PAC PT "6 Clicks" Mobility   Outcome Measure  Help needed turning from your back to your side while in a flat bed  without using bedrails?: None Help needed moving from lying on your back to sitting on the side of a flat bed without using bedrails?: None Help needed moving to and from a bed to a chair (including a wheelchair)?: A Little Help needed standing up from a chair using your arms (e.g., wheelchair or bedside chair)?: A Little Help needed to walk in hospital room?: A Little Help needed climbing 3-5 steps with a railing? 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

## 2020-03-31 NOTE — Progress Notes (Signed)
Patient admitted on the unit, educated on medication list, therapy schedule and plan of care. All questions and concerns addressed

## 2020-03-31 NOTE — H&P (Signed)
[place]   Physical Medicine and Rehabilitation Admission H&P  CC: I[facility]   HPI: Erica Rosario is a 44 year old right-handed female unremarkable past medical history no prescription medications.  Per chart review lives with spouse independent prior to admission.  Multilevel home.  Entry [facility] 03/25/2020 with headache and stiffness of neck as well as blurred vision.  Patient had been vaccinated COVID-06 September 2019 there was reported seizure in the ED.  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  There is a thrombosis of a draining cortical vein in the region of hemorrhage.  2 rounded areas of enhancement within the left occipital hemorrhage possibly representing active ongoing hemorrhage.  CT angiogram of head and neck no arterial pathology of the neck.  No intracranial large or medium vessel occlusion.  Admission chemistries potassium 3.2, AST 48, ALT 55, hemoglobin 9.2.  EEG negative for seizure.  Echocardiogram with ejection fraction of 60 to 65% no wall motion abnormalities.  Neurology follow-up placed on Keppra for seizure prophylaxis.  Intravenous heparin initiated transitioned to Coumadin.  Therapy evaluations completed and patient was admitted for a comprehensive rehab program  Review of Systems  Constitutional: Negative for chills and fever.  HENT: Negative for hearing loss.   Eyes: Positive for blurred vision.  Respiratory: Negative for cough and shortness of breath.   Cardiovascular: Negative for chest pain, palpitations and leg swelling.  Gastrointestinal: Positive for constipation and nausea.  Genitourinary: Negative for dysuria, flank pain and hematuria.  Musculoskeletal: Positive for myalgias.  Skin: Negative for rash.  Neurological: Positive for headaches.  All other systems reviewed and are negative.  No past medical history on file. No past surgical  history on file. No family history on file. Social History:  reports that she has never smoked. She has never used smokeless tobacco. She reports previous alcohol use. She reports that she does not use drugs. Allergies: No Known Allergies Medications Prior to Admission  Medication Sig Dispense Refill  . acetaminophen (TYLENOL) 325 MG tablet Take 2 tablets (650 mg total) by mouth every 4 (four) hours as needed for mild pain (or temp > 37.5 C (99.5 F)).    Derrill Memo ON 04/01/2020] atorvastatin (LIPITOR) 40 MG tablet Take 1 tablet (40 mg total) by mouth daily.    . butalbital-acetaminophen-caffeine (FIORICET) 50-325-40 MG tablet Take 1 tablet by mouth every 8 (eight) hours as needed for headache. 14 tablet 0  . heparin 25000-0.45 UT/250ML-% infusion Inject 650 Units/hr into the vein continuous.    Marland Kitchen levETIRAcetam (KEPPRA) 500 MG tablet Take 1 tablet (500 mg total) by mouth 2 (two) times daily.    Marland Kitchen senna-docusate (SENOKOT-S) 8.6-50 MG tablet Take 1 tablet by mouth 2 (two) times daily.    . sodium chloride 0.9 % infusion Inject 50 mLs into the vein continuous.  0  . warfarin (COUMADIN) 4 MG tablet Take 1 tablet (4 mg total) by mouth one time only at 4 PM.      Drug Regimen Review Drug regimen was reviewed and remains appropriate with no significant issues identified  Home: Home Living Family/patient expects to be discharged to:: Private residence Living Arrangements: Spouse/significant other Available Help at Discharge: Family, Available 24 hours/day Type of Home: House Home Access: Level entry Home Layout: One level Bathroom Shower/Tub: Tub/shower unit, Architectural technologist: Standard Home Equipment: Grab bars - tub/shower, Hand held shower head, Shower seat, Grab bars -  toilet  Lives With: Spouse   Functional History: Prior Function Level of Independence: Independent Comments: works sewing  Functional Status:  Mobility: Bed Mobility Overal bed mobility: Independent Bed  Mobility: Supine to Sit, Sit to Supine Supine to sit: Supervision Sit to supine: Supervision General bed mobility comments: Bed flat, pt utilizing end of bed rail to pull trunk more anteriorly and to push herself towards EOB.  Transfers Overall transfer level: Needs assistance Equipment used: None Transfers: Sit to/from Stand Sit to Stand: Min guard Stand pivot transfers: Min assist, +2 safety/equipment General transfer comment: Pt able to come to stand with slight unsteadiness noted upon coming to stand resulting in the pt requiring a brief moment prior to ambulating. MinA for safety reasons. Ambulation/Gait Ambulation/Gait assistance: Min Web designer (Feet): 220 Feet Assistive device: IV Pole Gait Pattern/deviations: Step-through pattern, Decreased stride length, Shuffle, Drifts right/left General Gait Details: Pt pushed IV pole in R hand during gait, and despite cues to bring IV pole more lateral to pt she continued to place it proximal to R foot. Required assistance to manage IV pole, esp with turns, and minA and intermittent modA for balance with her drifting towards the R on occasion. Gait velocity: reduced Gait velocity interpretation: <1.31 ft/sec, indicative of household ambulator  ADL: ADL Overall ADL's : Needs assistance/impaired Eating/Feeding: Supervision/ safety, Sitting Eating/Feeding Details (indicate cue type and reason): due to vision Grooming: Set up, Supervision/safety, Sitting Grooming Details (indicate cue type and reason): due to vision Upper Body Bathing: Set up, Supervision/ safety, Sitting Upper Body Bathing Details (indicate cue type and reason): due to vision Lower Body Bathing: Minimal assistance, Sit to/from stand Upper Body Dressing : Supervision/safety, Set up, Sitting Upper Body Dressing Details (indicate cue type and reason): due to vision Lower Body Dressing: Minimal assistance, Sit to/from stand Toilet Transfer: Minimal assistance,  Ambulation Toilet Transfer Details (indicate cue type and reason): bed>recliner>door>back to recliner (slow pace due to decreased balance) Toileting- Clothing Manipulation and Hygiene: Minimal assistance, Sit to/from stand  Cognition: Cognition Overall Cognitive Status: Impaired/Different from baseline Arousal/Alertness: Awake/alert Orientation Level: Oriented X4 Attention: Focused, Sustained, Selective Focused Attention: Appears intact Sustained Attention: Appears intact Selective Attention: Impaired Memory: Impaired Memory Impairment: Storage deficit, Retrieval deficit Awareness: Appears intact Problem Solving: Impaired Problem Solving Impairment: Verbal basic, Functional basic Safety/Judgment: Appears intact Cognition Arousal/Alertness: Awake/alert Behavior During Therapy: WFL for tasks assessed/performed Overall Cognitive Status: Impaired/Different from baseline Area of Impairment: Safety/judgement, Awareness Safety/Judgement: Decreased awareness of safety Awareness: Emergent Problem Solving: Requires verbal cues General Comments: Pt working on strategies for visual compensation in pen and paper tasks  Physical Exam: There were no vitals taken for this visit. Physical Exam  General: Alert and oriented x 2 (unable to state date, but can state month and day of week), No apparent distress HEENT: Head is normocephalic, atraumatic, PERRLA, EOMI, sclera anicteric, oral mucosa pink and moist, dentition intact, ext ear canals clear,  Neck: Supple without JVD or lymphadenopathy Heart: Reg rate and rhythm. No murmurs rubs or gallops Chest: CTA bilaterally without wheezes, rales, or rhonchi; no distress Abdomen: Soft, non-tender, non-distended, bowel sounds positive. Extremities: No clubbing, cyanosis, or edema. Pulses are 2+ Skin: Clean and intact without signs of breakdown Neuro: Patient is alert.  Makes eye contact with examiner.  She does have some impaired memory but was able  to provide her name and place.  Follows simple commands. 5/5 strength throughout  Psych: Pt's affect is pleasant. Pt is cooperative   Results for orders  placed or performed during the hospital encounter of 03/25/20 (from the past 48 hour(s))  Basic metabolic panel     Status: Abnormal   Collection Time: 03/30/20 12:46 AM  Result Value Ref Range   Sodium 139 135 - 145 mmol/L   Potassium 4.0 3.5 - 5.1 mmol/L   Chloride 109 98 - 111 mmol/L   CO2 19 (L) 22 - 32 mmol/L   Glucose, Bld 91 70 - 99 mg/dL    Comment: Glucose reference range applies only to samples taken after fasting for at least 8 hours.   BUN <5 (L) 6 - 20 mg/dL   Creatinine, Ser 0.67 0.44 - 1.00 mg/dL   Calcium 8.7 (L) 8.9 - 10.3 mg/dL   GFR, Estimated >60 >60 mL/min   Anion gap 11 5 - 15    Comment: Performed at [facility] [address], [place], [place] [address]  CBC     Status: Abnormal   Collection Time: 03/30/20 12:46 AM  Result Value Ref Range   WBC 7.1 4.0 - 10.5 K/uL   RBC 3.72 (L) 3.87 - 5.11 MIL/uL   Hemoglobin 8.2 (L) 12.0 - 15.0 g/dL    Comment: Reticulocyte Hemoglobin testing may be clinically indicated, consider ordering this additional test [id number]    HCT 27.7 (L) 36 - 46 %   MCV 74.5 (L) 80.0 - 100.0 fL   MCH 22.0 (L) 26.0 - 34.0 pg   MCHC 29.6 (L) 30.0 - 36.0 g/dL   RDW 18.8 (H) 11.5 - 15.5 %   Platelets 347 150 - 400 K/uL   nRBC 0.0 0.0 - 0.2 %    Comment: Performed at [facility], [place] [address], [place], [place] [address]  Protime-INR     Status: Abnormal   Collection Time: 03/30/20 12:46 AM  Result Value Ref Range   Prothrombin Time 19.1 (H) 11.4 - 15.2 seconds   INR 1.7 (H) 0.8 - 1.2    Comment: (NOTE) INR goal varies based on device and disease states. Performed at [facility], [place] [address], [place], [place] [address]   Heparin level (unfractionated)     Status: None   Collection Time: 03/30/20 12:46 AM  Result Value Ref Range   Heparin Unfractionated  0.30 0.30 - 0.70 IU/mL    Comment: (NOTE) If heparin results are below expected values, and patient dosage has  been confirmed, suggest follow up testing of antithrombin III levels. Performed at [facility], [place] [address], [place], [place] [address]   Heparin level (unfractionated)     Status: Abnormal   Collection Time: 03/30/20  3:24 PM  Result Value Ref Range   Heparin Unfractionated 0.27 (L) 0.30 - 0.70 IU/mL    Comment: (NOTE) If heparin results are below expected values, and patient dosage has  been confirmed, suggest follow up testing of antithrombin III levels. Performed at [facility], [place] [address], [place], [place] [address]   Basic metabolic panel     Status: Abnormal   Collection Time: 03/31/20  3:23 AM  Result Value Ref Range   Sodium 140 135 - 145 mmol/L   Potassium 3.7 3.5 - 5.1 mmol/L   Chloride 109 98 - 111 mmol/L   CO2 22 22 - 32 mmol/L   Glucose, Bld 84 70 - 99 mg/dL    Comment: Glucose reference range applies only to samples taken after fasting for at least 8 hours.   BUN <5 (L) 6 -  20 mg/dL   Creatinine, Ser 0.70 0.44 - 1.00 mg/dL   Calcium 8.9 8.9 - 10.3 mg/dL   GFR, Estimated >60 >60 mL/min   Anion gap 9 5 - 15    Comment: Performed at [facility] [address] [place], [place] [address]  CBC     Status: Abnormal   Collection Time: 03/31/20  3:23 AM  Result Value Ref Range   WBC 6.3 4.0 - 10.5 K/uL   RBC 3.76 (L) 3.87 - 5.11 MIL/uL   Hemoglobin 8.3 (L) 12.0 - 15.0 g/dL    Comment: Reticulocyte Hemoglobin testing may be clinically indicated, consider ordering this additional test [id number]    HCT 27.9 (L) 36 - 46 %   MCV 74.2 (L) 80.0 - 100.0 fL   MCH 22.1 (L) 26.0 - 34.0 pg   MCHC 29.7 (L) 30.0 - 36.0 g/dL   RDW 18.7 (H) 11.5 - 15.5 %   Platelets 377 150 - 400 K/uL   nRBC 0.3 (H) 0.0 - 0.2 %    Comment: Performed at [facility], [place] [address], [place], [place] [address]  Heparin level (unfractionated)      Status: None   Collection Time: 03/31/20  3:23 AM  Result Value Ref Range   Heparin Unfractionated 0.50 0.30 - 0.70 IU/mL    Comment: (NOTE) If heparin results are below expected values, and patient dosage has  been confirmed, suggest follow up testing of antithrombin III levels. Performed at [facility], [place] [address], [place], [place] [address]   Protime-INR     Status: Abnormal   Collection Time: 03/31/20  3:23 AM  Result Value Ref Range   Prothrombin Time 20.3 (H) 11.4 - 15.2 seconds   INR 1.8 (H) 0.8 - 1.2    Comment: (NOTE) INR goal varies based on device and disease states. Performed at [facility], [place] [address], [place], [place] [address]   Heparin level (unfractionated)     Status: None   Collection Time: 03/31/20  9:33 AM  Result Value Ref Range   Heparin Unfractionated 0.37 0.30 - 0.70 IU/mL    Comment: (NOTE) If heparin results are below expected values, and patient dosage has  been confirmed, suggest follow up testing of antithrombin III levels. Performed at [facility], [place] [address] [place], [place] [address]    No results found.     Medical Problem List and Plan: 1.  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  Antithrombotics: -DVT/anticoagulation: Coumadin  -antiplatelet therapy: N/A 3. Pain Management: Fioricet as needed. Denies pain 4. Mood: Provide emotional support  -antipsychotic agents: N/A 5. Neuropsych: This patient is capable of making decisions on her own behalf. 6. Skin/Wound Care: Routine skin checks 7. Fluids/Electrolytes/Nutrition: Routine in and outs with follow-up chemistries 8.  Seizure prophylaxis.  Keppra 500 mg twice daily. Denies recent seizures.  9.  Hyperlipidemia.  Lipitor 10. Disposition: Lives with husband and two sons aged 20 and 69. Husband  can take time off work if needed to care for her.   Lavon Paganini Angiulli, PA-C  I have personally performed a face to face diagnostic evaluation, including, but not limited to relevant history and physical exam findings, of this patient and developed relevant assessment and plan.  Additionally, I have reviewed and concur with the physician assistant's documentation above.  Leeroy Cha, MD

## 2020-03-31 NOTE — IPOC Note (Signed)
Individualized overall Plan of Care [facility]) Patient Details Name: AKERIA HEDSTROM MRN: [id number] DOB: 1976/05/01  Admitting Diagnosis: Thrombosis of superior sagittal sinus  Hospital Problems: Principal Problem:   Thrombosis of superior sagittal sinus Active Problems:   Left-sided nontraumatic intracerebral hemorrhage (HCC)   Cerebral venous sinus thrombosis   Transaminitis   Diarrhea     Functional Problem List: Nursing Endurance, Medication Management, Motor, Safety  PT Balance, Other (comment) (Vision)  OT Balance, Cognition, Endurance, Vision, Motor  SLP Cognition  TR         Basic ADL's: OT Grooming, Bathing, Dressing, Eating, Toileting     Advanced  ADL's: OT Simple Meal Preparation     Transfers: PT Bed Mobility, Car, Bed to Chair, Manufacturing systems engineer, Tub/Shower     Locomotion: PT Ambulation, Stairs     Additional Impairments: OT Fuctional Use of Upper Extremity  SLP None      TR      Anticipated Outcomes Item Anticipated Outcome  Self Feeding mod I  Swallowing      Basic self-care  mod I to supervision  Toileting  mod I   Bathroom Transfers mod I  Bowel/Bladder  To remain continent of bowel/bladder with mod I while in rehab  Transfers  supervision  Locomotion  supervision with LRAD  Communication     Cognition  supervision to mod I  Pain  <2  Safety/Judgment  able to call for help   Therapy Plan: PT Intensity: Minimum of 1-2 x/day ,45 to 90 minutes PT Duration Estimated Length of Stay: 2-3 days OT Intensity: Minimum of 1-2 x/day, 45 to 90 minutes OT Frequency: 5 out of 7 days OT Duration/Estimated Length of Stay: 7 days SLP Intensity: Minumum of 1-2 x/day, 30 to 90 minutes SLP Frequency: 3 to 5 out of 7 days SLP Duration/Estimated Length of Stay: 5-7 days    Team Interventions: Nursing Interventions Patient/Family Education, Disease Management/Prevention, Cognitive Remediation/Compensation, Medication Management, Discharge  Planning  PT interventions Ambulation/gait training, Discharge planning, Functional mobility training, Therapeutic Activities, Visual/perceptual remediation/compensation, Therapeutic Exercise, Neuromuscular re-education, Disease management/prevention, Cognitive remediation/compensation, Balance/vestibular training, Pain management, DME/adaptive equipment instruction, UE/LE Strength taining/ROM, UE/LE Coordination activities, Stair training, Patient/family education, Community reintegration  OT Interventions Training and development officer, Discharge planning, Self Care/advanced ADL retraining, Therapeutic Activities, UE/LE Coordination activities, Cognitive remediation/compensation, Disease mangement/prevention, Functional mobility training, Patient/family education, Therapeutic Exercise, Visual/perceptual remediation/compensation, DME/adaptive equipment instruction, Neuromuscular re-education, Psychosocial support, UE/LE Strength taining/ROM  SLP Interventions Cognitive remediation/compensation, Functional tasks, Internal/external aids, Patient/family education  TR Interventions    SW/CM Interventions Discharge Planning, Psychosocial Support, Patient/Family Education   Barriers to Discharge MD  Medical stability  Nursing      PT Home environment access/layout 3 STE  OT      SLP Other (comments) N/A  SW Other (comments) uninsured   Team Discharge Planning: Destination: PT-Home ,OT- Home , SLP-Home Projected Follow-up: PT-24 hour supervision/assistance, OT-  Other (comment) (intermittent supervision; HH vs OP OT), SLP-None, 24 hour supervision/assistance Projected Equipment Needs: PT-None recommended by PT, OT- To be determined, SLP-None recommended by SLP Equipment Details: PT- , OT-  Patient/family involved in discharge planning: PT- Family member/caregiver, Patient,  OT-Patient, SLP-Patient  MD ELOS: 3-4 days. Medical Rehab Prognosis:  Excellent Assessment: 44 year old right-handed female  unremarkable past medical history no prescription medications.  [facility]ed to [facility] hospital [facility]/2020 with headache and stiffness of neck as well as blurred vision.  Patient had been vaccinated COVID-06 September 2019 there was reported seizure in the ED.  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  There is a thrombosis of a draining cortical vein in the region of hemorrhage.  2 rounded areas of enhancement within the left occipital hemorrhage possibly representing active ongoing hemorrhage.  CT angiogram of head and neck no arterial pathology of the neck.  No intracranial large or medium vessel occlusion.  Admission chemistries potassium 3.2, AST 48, ALT 55, hemoglobin 9.2.  EEG negative for seizure.  Echocardiogram with ejection fraction of 60 to 65% no wall motion abnormalities.  Neurology follow-up placed on Keppra for seizure prophylaxis.  Intravenous heparin initiated transitioning to Coumadin.  Patient with resulting functional deficits with mobility, transfers, cognition, self-care.  Will set goals for Mod I/Supervision with PT/OT/SLP.  Due to the current state of emergency, patients may not be receiving their 3-hours of Medicare-mandated therapy.  See Team Conference Notes for weekly updates to the plan of care

## 2020-03-31 NOTE — Progress Notes (Signed)
Occupational Therapy Treatment Patient Details Name: Erica Rosario MRN: [id number] DOB: 1976-05-05 Today's Date: 03/31/2020    History of present illness Erica Rosario is a 44 y.o. female no PMH, presented to [facility] regional hospital this afternoon with complaints of headache and neck stiffness.Brain imaging revealed ICH which is close to the superior left parietal lobe in proximity with the venous drainage making suspicion for a venous infarct that might have bled.MRI brain confirmed the bleed and also revealed a cerebral venous thrombosis on superior sagittal sinus higher up with a thrombus also in the draining cortical vein.   OT comments  This 44 yo female seen today to continue working on pen and paper tasks with her right homonymous hemianopsia. She is able to do better with them when combining following with her finger and what she is trying to read is turned 90 degrees to her left (reading from bottom to top) than when paper is in normal position and also using her finger to follow to guide her. She still has difficulty tracing objects no matter how paper is turned and she cannot write numbers on a line in vertical or horizontal. I left her worksheets to work on. She did c/o dizziness with position changes today (which was new with me). She will greatly benefit from follow up on CIR.   Follow Up Recommendations  CIR;Supervision/Assistance - 24 hour    Equipment Recommendations  Tub/shower seat       Precautions / Restrictions Precautions Precautions: Fall Precaution Comments: R sided vision deficits, Spanish interpreter needed                 Vision Baseline Vision/History: Wears glasses Wears Glasses: Reading only (not all the time) Vision Assessment?: Yes Eye Alignment: Within Functional Limits Ocular Range of Motion: Within Functional Limits Tracking/Visual Pursuits: Able to track stimulus in all quads without difficulty Saccades: Additional eye shifts occurred  during testing;Decreased speed of saccadic movement Visual Fields: Right homonymous hemianopsia          Cognition Arousal/Alertness: Awake/alert Behavior During Therapy: WFL for tasks assessed/performed Overall Cognitive Status: Impaired/Different from baseline Area of Impairment: Safety/judgement;Awareness                         Safety/Judgement: Decreased awareness of safety   Problem Solving: Requires verbal cues General Comments: Pt working on strategies for visual compensation in pen and paper tasks        Exercises Other Exercises Other Exercises: working on pen and paper tasks of visual scanning both in vertical and horizontal planes, using finger as a guide at times, additional piece of paper as a guide at times, head and eye turns as needed, rotation of paper as needed, marking right hand side of paper to signify border.           Pertinent Vitals/ Pain       Pain Assessment: No/denies pain         Frequency  Min 2X/week        Progress Toward Goals  OT Goals(current goals can now be found in the care plan section)  Progress towards OT goals: Progressing toward goals     Plan Discharge plan remains appropriate       AM-PAC OT "6 Clicks" Daily Activity     Outcome Measure   Help from another person eating meals?: None Help from another person taking care of personal grooming?: A Little Help from another person toileting, which  includes using toliet, bedpan, or urinal?: A Little Help from another person bathing (including washing, rinsing, drying)?: A Little Help from another person to put on and taking off regular upper body clothing?: A Little Help from another person to put on and taking off regular lower body clothing?: A Little 6 Click Score: 19    End of Session    OT Visit Diagnosis: Unsteadiness on feet (R26.81);Other abnormalities of gait and mobility (R26.89);Low vision, both eyes (H54.2)   Activity Tolerance Patient  tolerated treatment well   Patient Left in bed;with call bell/phone within reach;with bed alarm set           Time: 3154-0086 OT Time Calculation (min): 15 min  Charges: OT General Charges $OT Visit: 1 Visit OT Treatments $Therapeutic Activity: 8-22 mins  [address]en Circle, OTR/L Acute [employer] Pager [phone] Office [phone]      Almon Register 03/31/2020, 11:21 AM

## 2020-03-31 NOTE — Progress Notes (Addendum)
PMR Admission Coordinator Pre-Admission Assessment   Patient: Erica Rosario is an 44 y.o., female MRN: [id number] DOB: Jan 21, 1976 Height: 4\' 11"  (149.9 cm) Weight: 63.5 kg                                                                                                                                                  Insurance Information HMO:     PPO:      PCP:      IPA:      80/20:      OTHER:  PRIMARY: Uninsured-Medicaid Potential  SECONDARY:       Policy#:       Phone#:    Development worker, community:       Phone#:    The Therapist, art Information Summary" for patients in Inpatient Rehabilitation Facilities with attached "Privacy Act [place] Records" was provided and verbally reviewed with: N/A   Emergency Contact Information         Contact Information     Name Relation Home Work Mobile    [place] Spouse [phone]   [phone]    [facility] Sister [phone]           Current Medical History  Patient Admitting Diagnosis: ICH History of Present Illness: Erica Rosario is a 44 y.o. right-handed female with unremarkable past medical history on no prescription medications.  Per chart review patient lives with spouse independent prior to admission.  Multilevel home.  Presented to [facility] 03/25/2020 with headache and neck stiffness as well as blurred vision.  Patient has been vaccinated against Covid 06 September 2019.  There was a reported seizure in the ED.  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  There is a thrombosis of a draining cortical vein in the region of hemorrhage.  2 rounded areas of enhancement within the left occipital hemorrhage possibly representing active ongoing hemorrhage.  CT angiogram of head and neck no arterial pathology in the neck.  No intracranial large or medium vessel occlusion.  Admission  chemistries potassium 3.2, AST 48, ALT 55, hemoglobin 9.2.  EEG negative for seizure.  Echocardiogram with ejection fraction of 60 to 65% no wall motion abnormalities.  Neurology follow-up placed on Keppra for seizure prophylaxis.  Intravenous heparin initiated transitioned to Coumadin.  Therapy evaluations completed with recommendations of physical medicine rehab consult. Complete NIHSS TOTAL: 5 Glasgow Coma Scale Score: 15   Past Medical History  History reviewed. No pertinent past medical history.   Family History  family history is not on file.   Prior Rehab/Hospitalizations:  Has the patient had prior rehab or hospitalizations prior to admission? No   Has the patient had major surgery during 100 days prior to admission? No   Current Medications  Current Facility-Administered Medications:  .   stroke: mapping our early stages of recovery book, , Does not apply, Once, Amie Portland, MD .  0.9 %  sodium chloride infusion, , Intravenous, Continuous, Rosalin Hawking, MD, Last Rate: 50 mL/hr at 03/29/20 2143, New Bag at 03/29/20 2143 .  acetaminophen (TYLENOL) tablet 650 mg, 650 mg, Oral, Q4H PRN, 650 mg at 03/30/20 1611 **OR** acetaminophen (TYLENOL) 160 MG/5ML solution 650 mg, 650 mg, Per Tube, Q4H PRN **OR** acetaminophen (TYLENOL) suppository 650 mg, 650 mg, Rectal, Q4H PRN, Amie Portland, MD .  atorvastatin (LIPITOR) tablet 40 mg, 40 mg, Oral, Daily, Rosalin Hawking, MD, 40 mg at 03/30/20 1001 .  butalbital-acetaminophen-caffeine (FIORICET) 50-325-40 MG per tablet 1 tablet, 1 tablet, Oral, Q8H PRN, Rosalin Hawking, MD, 1 tablet at 03/30/20 0810 .  coumadin book, , Does not apply, Once, Karren Cobble, RPH .  heparin ADULT infusion 100 units/mL (25000 units/239mL sodium chloride 0.45%), 650 Units/hr, Intravenous, Continuous, Millen, Jessica B, RPH, Last Rate: 6 mL/hr at 03/30/20 0812, 600 Units/hr at 03/30/20 6045 .  labetalol (NORMODYNE) injection 10-20 mg, 10-20 mg, Intravenous, Q2H PRN,  Rosalin Hawking, MD .  levETIRAcetam (KEPPRA) tablet 500 mg, 500 mg, Oral, BID, Rosalin Hawking, MD, 500 mg at 03/30/20 1001 .  pantoprazole (PROTONIX) EC tablet 40 mg, 40 mg, Oral, Daily, Rosalin Hawking, MD, 40 mg at 03/30/20 1001 .  senna-docusate (Senokot-S) tablet 1 tablet, 1 tablet, Oral, BID, Amie Portland, MD, 1 tablet at 03/30/20 1001 .  warfarin (COUMADIN) tablet 2 mg, 2 mg, Oral, ONCE-1600, Pierce, Dwayne A, RPH .  Warfarin - Pharmacist Dosing Inpatient, , Does not apply, q1600, Karren Cobble [facility], Given at 03/29/20 1642   Patients Current Diet:     Diet Order                      Diet regular Room service appropriate? Yes with Assist; Fluid consistency: Thin  Diet effective now                      Precautions / Restrictions Precautions Precautions: Fall Precaution Comments: R sided vision deficits, Spanish interpreter needed Restrictions Weight Bearing Restrictions: No    Has the patient had 2 or more falls or a fall with injury in the past year?No   Prior Activity Level Community (5-7x/wk): Pt. was active in the community PTA   Prior Functional Level Prior Function Level of Independence: Independent Comments: works sewing   Self Care: Did the patient need help bathing, dressing, using the toilet or eating?  Independent   Indoor Mobility: Did the patient need assistance with walking from room to room (with or without device)? Independent   Stairs: Did the patient need assistance with internal or external stairs (with or without device)? Independent   Functional Cognition: Did the patient need help planning regular tasks such as shopping or remembering to take medications? Independent   Home Assistive Devices / Equipment Home Equipment: Grab bars - tub/shower, Hand held shower head, Shower seat, Grab bars - toilet   Prior Device Use: Indicate devices/aids used by the patient prior to current illness, exacerbation or injury? None of the above   Current  Functional Level Cognition   Arousal/Alertness: Awake/alert Overall Cognitive Status: Impaired/Different from baseline Orientation Level: Oriented X4 Safety/Judgement: Decreased awareness of safety, Decreased awareness of deficits General Comments: Pt continues to require cues to scan and turn head to R to be aware of her surroundings for safety  measures. Pt required cues to manage lines this date. Extra time and several explanations to perform difficult taks requiring multiple steps. Attention: Focused, Sustained, Selective Focused Attention: Appears intact Sustained Attention: Appears intact Selective Attention: Impaired Memory: Impaired Memory Impairment: Storage deficit, Retrieval deficit Awareness: Appears intact Problem Solving: Impaired Problem Solving Impairment: Verbal basic, Functional basic Safety/Judgment: Appears intact    Extremity Assessment (includes Sensation/Coordination)   Upper Extremity Assessment: RUE deficits/detail RUE Deficits / Details: very slight drift when eyes are closed and asked to keep arms up in the air  Lower Extremity Assessment: Overall WFL for tasks assessed     ADLs   Overall ADL's : Needs assistance/impaired Eating/Feeding: Supervision/ safety, Sitting Eating/Feeding Details (indicate cue type and reason): due to vision Grooming: Set up, Supervision/safety, Sitting Grooming Details (indicate cue type and reason): due to vision Upper Body Bathing: Set up, Supervision/ safety, Sitting Upper Body Bathing Details (indicate cue type and reason): due to vision Lower Body Bathing: Minimal assistance, Sit to/from stand Upper Body Dressing : Supervision/safety, Set up, Sitting Upper Body Dressing Details (indicate cue type and reason): due to vision Lower Body Dressing: Minimal assistance, Sit to/from stand Toilet Transfer: Minimal assistance, Ambulation Toilet Transfer Details (indicate cue type and reason): bed>recliner>door>back to recliner (slow  pace due to decreased balance) Toileting- Clothing Manipulation and Hygiene: Minimal assistance, Sit to/from stand     Mobility   Overal bed mobility: Needs Assistance Bed Mobility: Supine to Sit, Sit to Supine Supine to sit: Supervision Sit to supine: Supervision General bed mobility comments: Bed flat, pt utilizing end of bed rail to pull trunk more anteriorly and to push herself towards EOB.      Transfers   Overall transfer level: Needs assistance Equipment used: None Transfers: Sit to/from Stand Sit to Stand: Min assist Stand pivot transfers: Min assist, +2 safety/equipment General transfer comment: Pt able to come to stand with slight unsteadiness noted upon coming to stand resulting in the pt requiring a brief moment prior to ambulating. MinA for safety reasons.     Ambulation / Gait / Stairs / Wheelchair Mobility   Ambulation/Gait Ambulation/Gait assistance: Herbalist (Feet): 220 Feet Assistive device: IV Pole Gait Pattern/deviations: Step-through pattern, Decreased stride length, Shuffle, Drifts right/left General Gait Details: Pt pushed IV pole in R hand during gait, and despite cues to bring IV pole more lateral to pt she continued to place it proximal to R foot. Required assistance to manage IV pole, esp with turns, and minA and intermittent modA for balance with her drifting towards the R on occasion. Gait velocity: reduced Gait velocity interpretation: <1.31 ft/sec, indicative of household ambulator     Posture / Balance Dynamic Sitting Balance Sitting balance - Comments: Able to sit EOB statically without UE support without LOB. Balance Overall balance assessment: Needs assistance Sitting-balance support: No upper extremity supported, Feet unsupported Sitting balance-Leahy Scale: Good Sitting balance - Comments: Able to sit EOB statically without UE support without LOB. Standing balance support: Single extremity supported Standing balance-Leahy Scale:  Poor Standing balance comment: Unsteadiness noted with standing mobility, requiring assistance secondary to vision deficits and dizziness. Standardized Balance Assessment Standardized Balance Assessment : Dynamic Gait Index Dynamic Gait Index Level Surface: Mild Impairment Change in Gait Speed: Mild Impairment Gait with Horizontal Head Turns: Severe Impairment Gait with Vertical Head Turns: Moderate Impairment Gait and Pivot Turn: Mild Impairment Step Over Obstacle: Severe Impairment Step Around Obstacles: Severe Impairment Steps: Moderate Impairment (assumed, did not test) Total Score: 8  Special needs/care consideration  Special service needs Pt. And family require Spanish translator        Previous Home Environment (from acute therapy documentation) Living Arrangements: Spouse/significant other  Lives With: Spouse Available Help at Discharge: Family, Available 24 hours/day Type of Home: House Home Layout: One level Home Access: Level entry Bathroom Shower/Tub: Tub/shower unit, Architectural technologist: Standard   Discharge Living Setting Plans for Discharge Living Setting: Patient's home Type of Home at Discharge: House Discharge Home Layout: One level Discharge Home Access: Stairs to enter Entrance Stairs-Rails: Can reach both Entrance Stairs-Number of Steps: 2 Discharge Bathroom Shower/Tub: Tub/shower unit Discharge Bathroom Toilet: Standard Discharge Bathroom Accessibility: Yes How Accessible: Accessible via walker Does the patient have any problems obtaining your medications?: No   Social/Family/Support Systems Patient Roles: Spouse Contact Information:  [phone]  Anticipated Caregiver: Setterlund,Justiniano  Anticipated Caregiver's Contact Information:  [phone]  Ability/Limitations of Caregiver:  (none) Caregiver Availability: Intermittent Discharge Plan Discussed with Primary Caregiver: Yes Is Caregiver In Agreement with Plan?: Yes Does  Caregiver/Family have Issues with Lodging/Transportation while Pt is in Rehab?: Yes     Goals Patient/Family Goal for Rehab: PT/OT/SLP mod I Expected length of stay: 5-7 days Cultural Considerations: Needs Spanish Interpreter Pt/Family Agrees to Admission and willing to participate: Yes Program Orientation Provided & Reviewed with Pt/Caregiver Including Roles  & Responsibilities: Yes     Decrease burden of Care through IP rehab admission: Specialzed equipment needs, Decrease number of caregivers and Patient/family education     Possible need for SNF placement upon discharge: Not anticipated     Patient Condition: This patient's condition remains as documented in the consult dated 10/, in which the Rehabilitation Physician determined and documented that/ the patient's condition is appropriate for intensive rehabilitative care in an inpatient rehabilitation facility. Will admit to inpatient rehab today.    Preadmission Screen Completed By:  Genella Mech, CCC-SLP, 03/30/2020 4:37 PM ______________________________________________________________________   Discussed status with Dr. Ranell Patrick on at  and received approval for admission today.

## 2020-04-01 ENCOUNTER — Inpatient Hospital Stay (HOSPITAL_COMMUNITY): Payer: Self-pay | Admitting: Occupational Therapy

## 2020-04-01 ENCOUNTER — Inpatient Hospital Stay (HOSPITAL_COMMUNITY): Payer: Self-pay

## 2020-04-01 ENCOUNTER — Inpatient Hospital Stay (HOSPITAL_COMMUNITY): Payer: Self-pay | Admitting: Speech Pathology

## 2020-04-01 DIAGNOSIS — G08 Intracranial and intraspinal phlebitis and thrombophlebitis: Secondary | ICD-10-CM

## 2020-04-01 DIAGNOSIS — R7401 Elevation of levels of liver transaminase levels: Secondary | ICD-10-CM

## 2020-04-01 DIAGNOSIS — I636 Cerebral infarction due to cerebral venous thrombosis, nonpyogenic: Secondary | ICD-10-CM

## 2020-04-01 DIAGNOSIS — R197 Diarrhea, unspecified: Secondary | ICD-10-CM

## 2020-04-01 DIAGNOSIS — D649 Anemia, unspecified: Secondary | ICD-10-CM

## 2020-04-01 LAB — CBC WITH DIFFERENTIAL/PLATELET
Abs Immature Granulocytes: 0.02 10*3/uL (ref 0.00–0.07)
Basophils Absolute: 0 10*3/uL (ref 0.0–0.1)
Basophils Relative: 1 %
Eosinophils Absolute: 0.1 10*3/uL (ref 0.0–0.5)
Eosinophils Relative: 2 %
HCT: 29.4 % — ABNORMAL LOW (ref 36.0–46.0)
Hemoglobin: 8.7 g/dL — ABNORMAL LOW (ref 12.0–15.0)
Immature Granulocytes: 0 %
Lymphocytes Relative: 38 %
Lymphs Abs: 2.5 10*3/uL (ref 0.7–4.0)
MCH: 21.6 pg — ABNORMAL LOW (ref 26.0–34.0)
MCHC: 29.6 g/dL — ABNORMAL LOW (ref 30.0–36.0)
MCV: 73 fL — ABNORMAL LOW (ref 80.0–100.0)
Monocytes Absolute: 0.5 10*3/uL (ref 0.1–1.0)
Monocytes Relative: 8 %
Neutro Abs: 3.4 10*3/uL (ref 1.7–7.7)
Neutrophils Relative %: 51 %
Platelets: 429 10*3/uL — ABNORMAL HIGH (ref 150–400)
RBC: 4.03 MIL/uL (ref 3.87–5.11)
RDW: 18.4 % — ABNORMAL HIGH (ref 11.5–15.5)
WBC: 6.6 10*3/uL (ref 4.0–10.5)
nRBC: 0 % (ref 0.0–0.2)

## 2020-04-01 LAB — COMPREHENSIVE METABOLIC PANEL
ALT: 262 U/L — ABNORMAL HIGH (ref 0–44)
AST: 150 U/L — ABNORMAL HIGH (ref 15–41)
Albumin: 3.3 g/dL — ABNORMAL LOW (ref 3.5–5.0)
Alkaline Phosphatase: 78 U/L (ref 38–126)
Anion gap: 11 (ref 5–15)
BUN: 6 mg/dL (ref 6–20)
CO2: 22 mmol/L (ref 22–32)
Calcium: 9 mg/dL (ref 8.9–10.3)
Chloride: 106 mmol/L (ref 98–111)
Creatinine, Ser: 0.7 mg/dL (ref 0.44–1.00)
GFR, Estimated: >60 mL/min (ref >60)
Glucose, Bld: 85 mg/dL (ref 70–99)
Potassium: 3.8 mmol/L (ref 3.5–5.1)
Sodium: 139 mmol/L (ref 135–145)
Total Bilirubin: 0.3 mg/dL (ref 0.3–1.2)
Total Protein: 6.9 g/dL (ref 6.5–8.1)

## 2020-04-01 LAB — PROTIME-INR
INR: 2 — ABNORMAL HIGH (ref 0.8–1.2)
Prothrombin Time: 22.4 seconds — ABNORMAL HIGH (ref 11.4–15.2)

## 2020-04-01 LAB — HEPARIN LEVEL (UNFRACTIONATED): Heparin Unfractionated: 0.25 IU/mL — ABNORMAL LOW (ref 0.30–0.70)

## 2020-04-01 MED ORDER — WARFARIN SODIUM 4 MG PO TABS
4.0000 mg | ORAL_TABLET | Freq: Once | ORAL | Status: AC
  Administered 2020-04-01: 4 mg via ORAL
  Filled 2020-04-01: qty 1

## 2020-04-01 NOTE — Progress Notes (Addendum)
Patient Details  Name: Erica Rosario MRN: [id number] Date of Birth: Jul 11, 1975  Today's Date: 04/01/2020  Hospital Problems: Principal Problem:   Thrombosis of superior sagittal sinus Active Problems:   Left-sided nontraumatic intracerebral hemorrhage (HCC)   Cerebral venous sinus thrombosis   Transaminitis   Diarrhea  Past Medical History: History reviewed. No pertinent past medical history. Past Surgical History: History reviewed. No pertinent surgical history. Social History:  reports that she has never smoked. She has never used smokeless tobacco. She reports previous alcohol use. She reports that she does not use drugs.  Family / Support Systems Marital Status: Married Patient Roles: Spouse, Parent, Other (Comment) (employee) Spouse/Significant Other: Justinaino-husband [phone]-home Children: They have a 53 & 29 yo son's Other Supports: Special educational needs teacher Anticipated Caregiver: Husband and other family members Ability/Limitations of Caregiver: can take time off work and they have extended family members to assist Caregiver Availability: 24/7 (For short time) Family Dynamics: Close knit family and extended family who will pull together when one of them has needs. Both pt and husband feel very blessed for the family they have and know they will have care if needed.  Social History Preferred language: Spanish Religion: Catholic Cultural Background: From [place]-primary language is spanish Education: HS Read: Yes (spanish) Write: Yes (spanish) Employment Status: Employed Name of Employer: [employer] Return to Work Plans: Plans to return once recovered Public relations account executive Issues: No issues Guardian/Conservator: None-according to MD pt is capable of making her own decisions while here. Husband is here today and has an interpreter for her therapy sessions   Abuse/Neglect Abuse/Neglect Assessment Can Be Completed: Yes Physical Abuse:  Denies Verbal Abuse: Denies Sexual Abuse: Denies Exploitation of patient/patient's resources: Denies Self-Neglect: Denies  Emotional Status Pt's affect, behavior and adjustment status: Pt is motivated to do well and feels she is recovering from this event. She tries to take care of herself and goes to the MD when she is ill. She is motivated to get home but wants to be ready first. Recent Psychosocial Issues: thought she was healthy prior to this happening Psychiatric History: No issues-would benefit from seeing neuro-psych while here due to young age and for coping Substance Abuse History: No issues  Patient / Family Perceptions, Expectations & Goals Pt/Family understanding of illness & functional limitations: Pt and husband have a good understanding of her condition and her treatment plan going forward. Both talk wiht the MD and feel they are aware of her medical issues up ot this point. Premorbid pt/family roles/activities: Wife. Mom, emplpoyee, sibling, etc Anticipated changes in roles/activities/participation: resume Pt/family expectations/goals: Pt states: " I want to do well and be able to do for myself when I leave here."  Husband states; " I can see she is doing better."  [employer]: Other (Comment) ([facility]-PCP) Premorbid Home Care/DME Agencies: None Transportation available at discharge: Self and now her husband and other family members will assist with this Resource referrals recommended: Neuropsychology  Discharge Planning Living Arrangements: Spouse/significant other, Children Support Systems: Spouse/significant other, Children, Other relatives, Friends/neighbors, Church/faith community Type of Residence: Private residence Insurance Resources: Teacher, adult education Resources: Employment, Secondary school teacher Screen Referred: Yes (Tiana-to assist with Insurance underwriter) Living Expenses: Rent Money Management: Patient,  Spouse Does the patient have any problems obtaining your medications?: No Home Management: Self Patient/Family Preliminary Plans: Return home with her husband and their two son's. Husband can take some time off work to be there with her. They do have other family  members who are supporitve and involved and will assist. Care Coordinator Barriers to Discharge: Other (comments) Care Coordinator Barriers to Discharge Comments: uninsured Care Coordinator Anticipated Follow Up Needs: HH/OP  Clinical Impression Pleasant couple who are very appreciative of her care and therapy. Will make sure pt has 24/7 when first goes home and both hope she will continue to progress and return to her independent level. [facility]-med assist to assist with medicaid application. Pt fairly high level and will be short length of stay. Await therapy evaulations  Elease Hashimoto 04/01/2020, 12:35 PM

## 2020-04-01 NOTE — Progress Notes (Addendum)
[facility] PHYSICAL MEDICINE & REHABILITATION PROGRESS NOTE  Subjective/Complaints: Patient seen laying in bed this morning.  Interpreter at bedside along with therapies.  She states she slept well overnight.  She is ready to begin therapies.  She notes improving diarrhea.  ROS: + Diarrhea. Denies CP, SOB, N/V  Objective: Vital Signs: Blood pressure (!) 101/52, pulse 73, temperature 98.3 F (36.8 C), temperature source Oral, resp. rate 18, height 5' (1.524 m), weight 66.2 kg, SpO2 100 %. No results found. Recent Labs    03/31/20 0323 04/01/20 0651  WBC 6.3 6.6  HGB 8.3* 8.7*  HCT 27.9* 29.4*  PLT 377 429*   Recent Labs    03/31/20 0323 04/01/20 0651  NA 140 139  K 3.7 3.8  CL 109 106  CO2 22 22  GLUCOSE 84 85  BUN <5* 6  CREATININE 0.70 0.70  CALCIUM 8.9 9.0    Intake/Output Summary (Last 24 hours) at 04/01/2020 1022 Last data filed at 04/01/2020 0900 Gross per 24 hour  Intake 700.85 ml  Output 1 ml  Net 699.85 ml        Physical Exam: BP (!) 101/52 (BP Location: Left Arm)   Pulse 73   Temp 98.3 F (36.8 C) (Oral)   Resp 18   Ht 5' (1.524 m) Comment: per pt  Wt 66.2 kg   SpO2 100%   BMI 28.50 kg/m  Constitutional: No distress . Vital signs reviewed. HENT: Normocephalic.  Atraumatic. Eyes: EOMI. No discharge. Cardiovascular: No JVD.  RRR. Respiratory: Normal effort.  No stridor.  Bilateral clear to auscultation. GI: Non-distended.  BS +. Skin: Warm and dry.  Intact. Psych: Normal mood.  Normal behavior. Musc: No edema in extremities.  No tenderness in extremities. Neuro: Alert Motor: LUE/LE: 5/5 proximal distal LUE: 4+/5 proximal distal with apraxia Mild dysmetria Left lower extremity: 5/5 proximal to distal  Assessment/Plan: 1. 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  Physiatrist is providing close team supervision and 24 hour management of active medical problems listed below.  Physiatrist and rehab team continue to assess barriers to discharge/monitor patient progress toward functional and medical goals   Care Tool:  Bathing              Bathing assist       Upper Body Dressing/Undressing Upper body dressing        Upper body assist      Lower Body Dressing/Undressing Lower body dressing            Lower body assist       Toileting Toileting    Toileting assist Assist for toileting: Contact Guard/Touching assist     Transfers Chair/bed transfer  Transfers assist           Locomotion Ambulation   Ambulation assist              Walk 10 feet activity   Assist           Walk 50 feet activity   Assist           Walk 150 feet activity   Assist           Walk 10 feet on uneven surface  activity   Assist           Wheelchair     Assist  Wheelchair 50 feet with 2 turns activity    Assist            Wheelchair 150 feet activity     Assist           Medical Problem List and Plan: 1.  Decreased functional mobility with headache secondary to superior sagittal sinus thrombosis with resultant venous infarct and hemorrhagic conversion  Begin CIR evaluations 2.  Antithrombotics: -DVT/anticoagulation: Heparin GGT transition to Coumadin  INR borderline therapeutic, will continue heparin GGT, level subtherapeutic on 10/13, dose adjusted - discussed with pharmacy.             -antiplatelet therapy: N/A 3. Pain Management/vascular headache: Fioricet as needed. Denies pain 4. Mood: Provide emotional support             -antipsychotic agents: N/A 5. Neuropsych: This patient is capable of making decisions on her own behalf. 6. Skin/Wound Care: Routine skin checks 7. Fluids/Electrolytes/Nutrition: Routine in and outs. 8.  Seizure  prophylaxis.    Keppra 500 mg twice daily.  9.  Hyperlipidemia.    Lipitor 10. Disposition: Lives with husband and two sons aged 52 and 75. Husband can take time off work if needed to care for her.  11.  Diarrhea  Appears to be improving 12.  Anemia-multifactorial  Hemoglobin 8.7 on 10/13 13.  Transaminitis  Significant increase in LFTs since last check, labs ordered for tomorrow   LOS: 1 days A FACE TO FACE EVALUATION WAS PERFORMED  Rozelle Caudle [name] 04/01/2020, 10:22 AM

## 2020-04-01 NOTE — Patient Care Conference (Signed)
Inpatient RehabilitationTeam Conference and Plan of Care Update Date: 04/01/2020   Time: 11:11 AM    Patient Name: Erica Rosario      Medical Record Number: [id number]  Date of Birth: 1976-03-19 Sex: Female         Room/Bed: 6C12X/5T70Y-17 Payor Info: Payor: /    Admit Date/Time:  03/31/2020  2:19 PM  Primary Diagnosis:  Thrombosis of superior sagittal sinus  Hospital Problems: Principal Problem:   Thrombosis of superior sagittal sinus Active Problems:   Left-sided nontraumatic intracerebral hemorrhage (HCC)   Cerebral venous sinus thrombosis   Transaminitis   Diarrhea    Expected Discharge Date: Expected Discharge Date:  (evals pending)  Team Members Present: Physician leading conference: Dr. Delice Lesch Care Coodinator Present: Dorien Chihuahua, RN, BSN, CRRN;Other (comment) Jacqlyn Larsen Dupree, SW) Nurse Present: Other (comment) Dina Rich, RN) PT Present: Other (comment) (Christain Manhard, PT) OT Present: Leretha Pol, OT SLP Present: Jettie Booze, CF-SLP PPS Coordinator present : Ileana Ladd, Burna Mortimer, SLP     Current Status/Progress Goal Weekly Team Focus  Bowel/Bladder   Pt is continent of bowel and bladder. LBM-03/31/2020  To remain continent of bowel/bladder.  Assess tolieting needs often.   Swallow/Nutrition/ Hydration             ADL's             Mobility             Communication             Safety/Cognition/ Behavioral Observations  eval pending         Pain   Has occasional headaches. Can get tylenol if needed.  To keep pain level below 2/10.  Assess pain q shift or prn.   Skin   Skin is intact.  Prevent skin breakdown from occurring.  Assess skin q shift or prn.     Discharge Planning:  New eval home with husband will confirm if can provide assist or if works.   Team Discussion: Issues with diarrhea and anemia. LFTs elevated and MD monitoring. C/o HA; treating with Fioricet and Tylenol. Remains on Heparin drip/coumadin po.   Patient on target to meet rehab goals: All admission evals not completed.  *See Care Plan and progress notes for long and short-term goals.   Revisions to Treatment Plan:   Teaching Needs: Transfers, toileting, medications, etc.  Current Barriers to Discharge:   Possible Resolutions to Barriers:      Medical Summary Current Status: Decreased functional mobility with headache secondary to superior sagittal sinus thrombosis with resultant venous infarct and hemorrhagic conversion  Barriers to Discharge: Medical stability;Other (comments)  Barriers to Discharge Comments: Heparin ggt Possible Resolutions to [employer] Focus: Therapies, follow labs - LFTs, Hb, transition from heprain to coumadin alone   Continued Need for Acute Rehabilitation Level of Care: The patient requires daily medical management by a physician with specialized training in physical medicine and rehabilitation for the following reasons: Direction of a multidisciplinary physical rehabilitation program to maximize functional independence : Yes Medical management of patient stability for increased activity during participation in an intensive rehabilitation regime.: Yes Analysis of laboratory values and/or radiology reports with any subsequent need for medication adjustment and/or medical intervention. : Yes   I attest that I was present, lead the team conference, and concur with the assessment and plan of the team.   Dorien Chihuahua B 04/01/2020, 12:46 PM

## 2020-04-01 NOTE — Evaluation (Signed)
Speech Language Pathology Assessment and Plan  Patient Details  Name: Erica Rosario MRN: [id number] Date of Birth: 17-Dec-1975  SLP Diagnosis: Cognitive Impairments  Rehab Potential: Good ELOS: 5-7 days    Today's Date: 04/01/2020 SLP Individual Time: 0800-0900 SLP Individual Time Calculation (min): 60 min   Hospital Problem: Principal Problem:   Thrombosis of superior sagittal sinus Active Problems:   Left-sided nontraumatic intracerebral hemorrhage (HCC)   Cerebral venous sinus thrombosis   Transaminitis   Diarrhea  Past Medical History: History reviewed. No pertinent past medical history. Past Surgical History: History reviewed. No pertinent surgical history.  Assessment / Plan / Recommendation Clinical Impression   Patient is a 44 year old right-handed female unremarkable past medical history no prescription medications. Per chart review lives with spouse independent prior to admission. Multilevel home. [facility] with headache and stiffness of neck as well as blurred vision. Patient had been vaccinated COVID-06 September 2019 there was reported seizure in the ED. 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 There is a thrombosis of a draining cortical vein in the region of hemorrhage. 2 rounded areas of enhancement within the left occipital hemorrhage possibly representing active ongoing hemorrhage. CT angiogram of head and neck no arterial pathology of the neck. No intracranial large or medium vessel occlusion. Admission chemistries potassium 3.2, AST 48, ALT 55, hemoglobin 9.2. EEG negative for seizure. Echocardiogram with ejection fraction of 60 to 65% no wall motion abnormalities. Neurology follow-up placed on Keppra for seizure prophylaxis. Intravenous heparin initiated transitioned to Coumadin. Therapy evaluations completed and  patient was admitted for a comprehensive rehab program  Patient presents with a mild cognitive impairment which primarily is impacting her short term memory and orientation to time and place. She does demonstrate intellectual awareness, stating, "takes me time to analyze things" and described difficulty with right leg when using stairs on acute with PT and right peripheral vision loss. Patient scored in average range for all sections of Cognistat except for Memory and Orientation, for which she was in moderate impairment range. Initially she said year was "1919" but when SLP asked her if that sounded correct, she smiled and said "2000, 2000 something I dont know. She was also not oriented to month initially. When MD asked her same questions approximately 20 minutes into session, she was able to correctly state month and year and eventually named current president, though this was delayed for more than one minute in her response.  through evaluation. Patient was able to perform complex level verbal reasoning and problem solving questions but did exhibit a delay in responses and appeared with mild-mod delay in cognitive processing overall. Goals for cognitive function are at supervision-mod I range.    Skilled Therapeutic Interventions          Cognistat evaluation, speech-language cognitive evaluation  SLP Assessment  Patient will need skilled Walker Pathology Services during CIR admission    Recommendations  Patient destination: Home Follow up Recommendations: None;24 hour supervision/assistance Equipment Recommended: None recommended by SLP    SLP Frequency 3 to 5 out of 7 days   SLP Duration  SLP Intensity  SLP Treatment/Interventions 5-7 days  Minumum of 1-2 x/day, 30 to 90 minutes  Cognitive remediation/compensation;Functional tasks;Internal/external aids;Patient/family education    Pain Pain Assessment Pain Scale: 0-10 Pain Score: 0-No pain Pain Type: Acute pain Pain  Location: Head Pain Intervention(s): Medication (See eMAR)  Prior  Functioning Cognitive/Linguistic Baseline: Within functional limits Type of Home: House  Lives With: Spouse Available Help at Discharge: Family;Available 24 hours/day Education: completed 6th grade; 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: Works at home  SLP Evaluation Cognition Overall Cognitive Status: Impaired/Different from baseline Arousal/Alertness: Awake/alert Orientation Level: Oriented X4 Attention: Selective Focused Attention: Appears intact Sustained Attention: Appears intact Selective Attention: Appears intact Memory: Impaired Memory Impairment: Decreased short term memory;Storage deficit;Retrieval deficit Decreased Short Term Memory: Verbal basic Awareness: Appears intact Problem Solving: Impaired Problem Solving Impairment: Verbal complex Executive Function: Organizing;Initiating;Reasoning Reasoning: Appears intact Sequencing: Appears intact Organizing: Appears intact Decision Making: Appears intact Initiating: Appears intact Safety/Judgment: Appears intact  Comprehension Auditory Comprehension Overall Auditory Comprehension: Appears within functional limits for tasks assessed Expression Expression Primary Mode of Expression: Verbal Verbal Expression Overall Verbal Expression: Appears within functional limits for tasks assessed Oral Motor Oral Motor/Sensory Function Overall Oral Motor/Sensory Function: Within functional limits Motor Speech Overall Motor Speech: Appears within functional limits for tasks assessed  Care Tool Care Tool Cognition Expression of Ideas and Wants Expression of Ideas and Wants: Without difficulty (complex and basic) - expresses complex messages without difficulty and with speech that is clear and easy to understand   Understanding Verbal and Non-Verbal Content Understanding Verbal and Non-Verbal Content: Understands (complex and basic) - clear  comprehension without cues or repetitions   Memory/Recall Ability *first 3 days only Memory/Recall Ability *first 3 days only: Location of own room;Current season;Staff names and faces;That he or she is in a hospital/hospital unit           Short Term Goals: Week 1: SLP Short Term Goal 1 (Week 1): STG's=LTG"s (secondary to anticipated length of stay less than 5-7 days)  Refer to Care Plan for Long Term Goals  Recommendations for other services: None   Discharge Criteria: Patient will be discharged from SLP if patient refuses treatment 3 consecutive times without medical reason, if treatment goals not met, if there is a change in medical status, if patient makes no progress towards goals or if patient is discharged from hospital.  The above assessment, treatment plan, treatment alternatives and goals were discussed and mutually agreed upon: by patient   Sonia Baller, MA, CCC-SLP Speech Therapy

## 2020-04-01 NOTE — Progress Notes (Signed)
Physical Medicine and Rehabilitation Consult Reason for Consult: Decreased functional mobility with headache Referring Physician: Dr. Leonie Man   HPI: Erica Rosario is a 44 y.o. right-handed female with unremarkable past medical history on no prescription medications.  Per chart review patient lives with spouse independent prior to admission.  Multilevel home.  Presented to [facility] 03/25/2020 with headache and neck stiffness as well as blurred vision.  Patient has been vaccinated against Covid 06 September 2019.  There was a reported seizure in the ED.  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  There is a thrombosis of a draining cortical vein in the region of hemorrhage.  2 rounded areas of enhancement within the left occipital hemorrhage possibly representing active ongoing hemorrhage.  CT angiogram of head and neck no arterial pathology in the neck.  No intracranial large or medium vessel occlusion.  Admission chemistries potassium 3.2, AST 48, ALT 55, hemoglobin 9.2.  EEG negative for seizure.  Echocardiogram with ejection fraction of 60 to 65% no wall motion abnormalities.  Neurology follow-up placed on Keppra for seizure prophylaxis.  Intravenous heparin initiated transitioned to Coumadin.  Therapy evaluations completed with recommendations of physical medicine rehab consult.   Review of Systems  Constitutional: Negative for chills and fever.  HENT: Negative for hearing loss.   Eyes: Positive for blurred vision.  Respiratory: Negative for cough and shortness of breath.   Cardiovascular: Negative for chest pain and palpitations.  Gastrointestinal: Positive for constipation and nausea. Negative for heartburn and vomiting.  Genitourinary: Negative for dysuria, flank pain and hematuria.  Musculoskeletal: Positive for myalgias.  Neurological: Positive  for headaches.  All other systems reviewed and are negative.  History reviewed. No pertinent past medical history. No past surgical history on file. No family history on file. Social History:  reports that she has never smoked. She has never used smokeless tobacco. She reports previous alcohol use. She reports that she does not use drugs. Allergies: No Known Allergies No medications prior to admission.    Home: Home Living Family/patient expects to be discharged to:: Private residence Living Arrangements: Spouse/significant other Available Help at Discharge: Family, Available 24 hours/day Type of Home: House Home Access: Level entry Home Layout: Multi-level (basement, does not need to access) Bathroom Shower/Tub: Tub/shower unit, Architectural technologist: Standard Home Equipment: Grab bars - tub/shower, Hand held shower head, Shower seat, Grab bars - toilet  Lives With: Spouse  Functional History: Prior Function Level of Independence: Independent Comments: works sewing Functional Status:  Mobility: Bed Mobility Overal bed mobility: Needs Assistance Bed Mobility: Supine to Sit, Sit to Supine Supine to sit: Min guard, HOB elevated Sit to supine: Min guard, HOB elevated General bed mobility comments: Pt able to transition supine <> sit R EOB with HOB elevated with increased time. Transfers Overall transfer level: Needs assistance Equipment used: Rolling walker (2 wheeled) Transfers: Sit to/from Stand Sit to Stand: Min assist Stand pivot transfers: Min assist, +2 safety/equipment General transfer comment: Pt able to come to stand with slight unsteadiness noted upon coming to stand resulting in the pt requiring a brief moment prior to ambulating. MinA for safety reasons. Ambulation/Gait Ambulation/Gait assistance: Min assist Gait Distance (Feet): 200 Feet Assistive device: Rolling walker (2 wheeled) Gait Pattern/deviations: Step-through pattern, Decreased stride length,  Narrow base of support General Gait Details: Displays some trunk sway and decreased B step length,  cuing pt to correct with min momentary success. MinA to maintain balance as pt would tend to hit obstacles with the R anterior portion of the RW. Cued pt to turn head regularly to scan surroundings to avoid obstacles, with mod success and carryover. Pt hit ~1/2 of the obstacles in the hall and in her room, no injury, pt allowed to hit obstacles for learning purpose. Gait velocity: reduced Gait velocity interpretation: <1.31 ft/sec, indicative of household ambulator  ADL: ADL Overall ADL's : Needs assistance/impaired Eating/Feeding: Supervision/ safety, Sitting Eating/Feeding Details (indicate cue type and reason): due to vision Grooming: Set up, Supervision/safety, Sitting Grooming Details (indicate cue type and reason): due to vision Upper Body Bathing: Set up, Supervision/ safety, Sitting Upper Body Bathing Details (indicate cue type and reason): due to vision Lower Body Bathing: Minimal assistance, Sit to/from stand Upper Body Dressing : Supervision/safety, Set up, Sitting Upper Body Dressing Details (indicate cue type and reason): due to vision Lower Body Dressing: Minimal assistance, Sit to/from stand Toilet Transfer: Minimal assistance, Ambulation Toilet Transfer Details (indicate cue type and reason): bed>recliner>door>back to recliner (slow pace due to decreased balance) Toileting- Clothing Manipulation and Hygiene: Minimal assistance, Sit to/from stand  Cognition: Cognition Overall Cognitive Status: Impaired/Different from baseline Arousal/Alertness: Awake/alert Orientation Level: Oriented X4 Attention: Focused, Sustained, Selective Focused Attention: Appears intact Sustained Attention: Appears intact Selective Attention: Impaired Memory: Impaired Memory Impairment: Storage deficit, Retrieval deficit Awareness: Appears intact Problem Solving: Impaired Problem Solving  Impairment: Verbal basic, Functional basic Safety/Judgment: Appears intact Cognition Arousal/Alertness: Awake/alert Behavior During Therapy: WFL for tasks assessed/performed Overall Cognitive Status: Impaired/Different from baseline Area of Impairment: Safety/judgement, Awareness, Problem solving Safety/Judgement: Decreased awareness of safety, Decreased awareness of deficits Awareness: Emergent Problem Solving: Requires verbal cues General Comments: Pt required multiple cues to turn head to scan for objects to her R due to her decreased vision impacting her safety.  Blood pressure 102/61, pulse (!) 58, temperature 98.5 F (36.9 C), temperature source Oral, resp. rate 18, height 4\' 11"  (1.499 m), weight 63.5 kg, SpO2 100 %. Physical Exam General: Alert, No apparent distress HEENT: Head is normocephalic, atraumatic, PERRLA, EOMI, sclera anicteric, oral mucosa pink and moist, dentition intact, ext ear canals clear,  Neck: Supple without JVD or lymphadenopathy Heart: Bradycardic. No murmurs rubs or gallops Chest: CTA bilaterally without wheezes, rales, or rhonchi; no distress Abdomen: Soft, non-tender, non-distended, bowel sounds positive. Extremities: No clubbing, cyanosis, or edema. Pulses are 2+ Skin: Clean and intact without signs of breakdown Neuro: Patient is a bit lethargic but arousable. Impaired memory. Makes eye contact with examiner.  Provides name and age.  Follows simple commands. 5/5 strength throughout  Psych: Pt's affect is appropriate. Pt is cooperative    Lab Results Last 24 Hours       Results for orders placed or performed during the hospital encounter of 03/25/20 (from the past 24 hour(s))  Basic metabolic panel     Status: Abnormal   Collection Time: 03/29/20  6:08 AM  Result Value Ref Range   Sodium 139 135 - 145 mmol/L   Potassium 3.6 3.5 - 5.1 mmol/L   Chloride 109 98 - 111 mmol/L   CO2 20 (L) 22 - 32 mmol/L   Glucose, Bld 90 70 - 99 mg/dL   BUN  <5 (L) 6 - 20 mg/dL   Creatinine, Ser 0.67 0.44 - 1.00 mg/dL   Calcium 8.5 (L) 8.9 - 10.3 mg/dL   GFR, Estimated >60 >60 mL/min   Anion gap 10 5 -  15  CBC     Status: Abnormal   Collection Time: 03/29/20  6:08 AM  Result Value Ref Range   WBC 7.4 4.0 - 10.5 K/uL   RBC 3.73 (L) 3.87 - 5.11 MIL/uL   Hemoglobin 8.2 (L) 12.0 - 15.0 g/dL   HCT 27.9 (L) 36 - 46 %   MCV 74.8 (L) 80.0 - 100.0 fL   MCH 22.0 (L) 26.0 - 34.0 pg   MCHC 29.4 (L) 30.0 - 36.0 g/dL   RDW 19.0 (H) 11.5 - 15.5 %   Platelets 362 150 - 400 K/uL   nRBC 0.0 0.0 - 0.2 %  Protime-INR     Status: Abnormal   Collection Time: 03/29/20  6:08 AM  Result Value Ref Range   Prothrombin Time 16.1 (H) 11.4 - 15.2 seconds   INR 1.3 (H) 0.8 - 1.2  Heparin level (unfractionated)     Status: Abnormal   Collection Time: 03/29/20  6:08 AM  Result Value Ref Range   Heparin Unfractionated 1.02 (H) 0.30 - 0.70 IU/mL  Heparin level (unfractionated)     Status: Abnormal   Collection Time: 03/29/20  8:18 AM  Result Value Ref Range   Heparin Unfractionated 0.95 (H) 0.30 - 0.70 IU/mL  Heparin level (unfractionated)     Status: None   Collection Time: 03/29/20  2:28 PM  Result Value Ref Range   Heparin Unfractionated 0.65 0.30 - 0.70 IU/mL  Basic metabolic panel     Status: Abnormal   Collection Time: 03/30/20 12:46 AM  Result Value Ref Range   Sodium 139 135 - 145 mmol/L   Potassium 4.0 3.5 - 5.1 mmol/L   Chloride 109 98 - 111 mmol/L   CO2 19 (L) 22 - 32 mmol/L   Glucose, Bld 91 70 - 99 mg/dL   BUN <5 (L) 6 - 20 mg/dL   Creatinine, Ser 0.67 0.44 - 1.00 mg/dL   Calcium 8.7 (L) 8.9 - 10.3 mg/dL   GFR, Estimated >60 >60 mL/min   Anion gap 11 5 - 15  CBC     Status: Abnormal   Collection Time: 03/30/20 12:46 AM  Result Value Ref Range   WBC 7.1 4.0 - 10.5 K/uL   RBC 3.72 (L) 3.87 - 5.11 MIL/uL   Hemoglobin 8.2 (L) 12.0 - 15.0 g/dL   HCT 27.7 (L) 36 - 46 %   MCV 74.5 (L) 80.0 - 100.0 fL    MCH 22.0 (L) 26.0 - 34.0 pg   MCHC 29.6 (L) 30.0 - 36.0 g/dL   RDW 18.8 (H) 11.5 - 15.5 %   Platelets 347 150 - 400 K/uL   nRBC 0.0 0.0 - 0.2 %  Protime-INR     Status: Abnormal   Collection Time: 03/30/20 12:46 AM  Result Value Ref Range   Prothrombin Time 19.1 (H) 11.4 - 15.2 seconds   INR 1.7 (H) 0.8 - 1.2  Heparin level (unfractionated)     Status: None   Collection Time: 03/30/20 12:46 AM  Result Value Ref Range   Heparin Unfractionated 0.30 0.30 - 0.70 IU/mL      Imaging Results (Last 48 hours)  CT ANGIO HEAD W OR WO CONTRAST  Result Date: 03/28/2020 CLINICAL DATA:  Follow-up venous sinus thrombosis with left parietal venous infarction. EXAM: CT HEAD WITHOUT CONTRAST CT arteriography and venography OF THE HEAD and neck TECHNIQUE: Contiguous axial images were obtained from the base of the skull through the vertex without intravenous contrast. Multidetector CT imaging of the  head was performed using the standard protocol during bolus administration of intravenous contrast. Multiplanar CT image reconstructions and MIPs were obtained to evaluate the vascular anatomy. CONTRAST:  44mL OMNIPAQUE IOHEXOL 350 MG/ML SOLN COMPARISON:  Multiple previous exams most recently 03/25/2020 FINDINGS: CT HEAD Brain: Hemorrhagic infarction in the left parietal cortical and subcortical brain redemonstrated. Low-density is slightly increased, particularly along the anterior margin. Hemorrhagic foci are stable with no new or enlarging bleeds. Mild swelling with mass-effect and 1 or 2 mm of left-to-right shift and flattening of the left lateral ventricle. Vascular: See below Skull: Negative Sinuses/Orbits: Clear/normal CTA neck: Aortic arch is normal. Right common carotid artery is widely patent to the bifurcation. Normal carotid bifurcation. Normal cervical ICA. Left common carotid artery widely patent to the bifurcation. Carotid bifurcation is normal. Normal left ICA. Both vertebral arteries widely  patent at their origins and through the cervical region to the foramen magnum. CTA HEAD Anterior circulation: Both internal carotid arteries widely patent through the skull base and siphon regions. The anterior and middle cerebral vessels are normal without proximal stenosis, aneurysm or vascular malformation. Posterior circulation: Both vertebral arteries widely patent to the basilar. No basilar stenosis. Posterior circulation branch vessels are normal. Venous sinuses: See results of CT venography. Anatomic variants: None significant. CT venography of the head Anterior aspect of the superior sagittal sinus is patent. Again demonstrated is partial thrombosis of the superior sagittal sinus at the vertex. This is not occlusive as flow can be demonstrated around the margins of the thrombus. This is fairly segmental, involving a 5-6 cm segment of the sinus. Beyond that, the distal superior sagittal sinus is patent. Both transverse sinuses are patent. Compared to the initial presentation noncontrast CT and MRI, the thrombus does not appear progressive. Thrombosed superficial draining vein on the left again visible, similar in appearance. IMPRESSION: 1. No arterial pathology in the neck. 2. No intracranial large or medium vessel occlusion or correctable proximal stenosis. 3. Partial thrombosis of the superior sagittal sinus at the vertex, not occlusive as flow can be demonstrated around the margins of the thrombus. This is fairly segmental, involving a 5-6 cm segment of the sinus. Beyond that, the distal superior sagittal sinus is patent. Thrombosed superficial draining vein on the left again visible, similar in appearance. 4. Hemorrhagic infarction in the left parietal cortical and subcortical brain redemonstrated. Low-density edema is slightly increased, particularly along the anterior margin. No new or enlarging bleeds. Mild swelling with mass-effect and 1 or 2 mm of left-to-right shift and flattening of the left  lateral ventricle. Electronically Signed   By: Nelson Chimes M.D.   On: 03/28/2020 07:15   CT ANGIO NECK W OR WO CONTRAST  Result Date: 03/28/2020 CLINICAL DATA:  Follow-up venous sinus thrombosis with left parietal venous infarction. EXAM: CT HEAD WITHOUT CONTRAST CT arteriography and venography OF THE HEAD and neck TECHNIQUE: Contiguous axial images were obtained from the base of the skull through the vertex without intravenous contrast. Multidetector CT imaging of the head was performed using the standard protocol during bolus administration of intravenous contrast. Multiplanar CT image reconstructions and MIPs were obtained to evaluate the vascular anatomy. CONTRAST:  68mL OMNIPAQUE IOHEXOL 350 MG/ML SOLN COMPARISON:  Multiple previous exams most recently 03/25/2020 FINDINGS: CT HEAD Brain: Hemorrhagic infarction in the left parietal cortical and subcortical brain redemonstrated. Low-density is slightly increased, particularly along the anterior margin. Hemorrhagic foci are stable with no new or enlarging bleeds. Mild swelling with mass-effect and 1 or 2 mm  of left-to-right shift and flattening of the left lateral ventricle. Vascular: See below Skull: Negative Sinuses/Orbits: Clear/normal CTA neck: Aortic arch is normal. Right common carotid artery is widely patent to the bifurcation. Normal carotid bifurcation. Normal cervical ICA. Left common carotid artery widely patent to the bifurcation. Carotid bifurcation is normal. Normal left ICA. Both vertebral arteries widely patent at their origins and through the cervical region to the foramen magnum. CTA HEAD Anterior circulation: Both internal carotid arteries widely patent through the skull base and siphon regions. The anterior and middle cerebral vessels are normal without proximal stenosis, aneurysm or vascular malformation. Posterior circulation: Both vertebral arteries widely patent to the basilar. No basilar stenosis. Posterior circulation branch  vessels are normal. Venous sinuses: See results of CT venography. Anatomic variants: None significant. CT venography of the head Anterior aspect of the superior sagittal sinus is patent. Again demonstrated is partial thrombosis of the superior sagittal sinus at the vertex. This is not occlusive as flow can be demonstrated around the margins of the thrombus. This is fairly segmental, involving a 5-6 cm segment of the sinus. Beyond that, the distal superior sagittal sinus is patent. Both transverse sinuses are patent. Compared to the initial presentation noncontrast CT and MRI, the thrombus does not appear progressive. Thrombosed superficial draining vein on the left again visible, similar in appearance. IMPRESSION: 1. No arterial pathology in the neck. 2. No intracranial large or medium vessel occlusion or correctable proximal stenosis. 3. Partial thrombosis of the superior sagittal sinus at the vertex, not occlusive as flow can be demonstrated around the margins of the thrombus. This is fairly segmental, involving a 5-6 cm segment of the sinus. Beyond that, the distal superior sagittal sinus is patent. Thrombosed superficial draining vein on the left again visible, similar in appearance. 4. Hemorrhagic infarction in the left parietal cortical and subcortical brain redemonstrated. Low-density edema is slightly increased, particularly along the anterior margin. No new or enlarging bleeds. Mild swelling with mass-effect and 1 or 2 mm of left-to-right shift and flattening of the left lateral ventricle. Electronically Signed   By: Nelson Chimes M.D.   On: 03/28/2020 07:15   CT VENOGRAM HEAD  Result Date: 03/28/2020 CLINICAL DATA:  Follow-up venous sinus thrombosis with left parietal venous infarction. EXAM: CT HEAD WITHOUT CONTRAST CT arteriography and venography OF THE HEAD and neck TECHNIQUE: Contiguous axial images were obtained from the base of the skull through the vertex without intravenous contrast.  Multidetector CT imaging of the head was performed using the standard protocol during bolus administration of intravenous contrast. Multiplanar CT image reconstructions and MIPs were obtained to evaluate the vascular anatomy. CONTRAST:  3mL OMNIPAQUE IOHEXOL 350 MG/ML SOLN COMPARISON:  Multiple previous exams most recently 03/25/2020 FINDINGS: CT HEAD Brain: Hemorrhagic infarction in the left parietal cortical and subcortical brain redemonstrated. Low-density is slightly increased, particularly along the anterior margin. Hemorrhagic foci are stable with no new or enlarging bleeds. Mild swelling with mass-effect and 1 or 2 mm of left-to-right shift and flattening of the left lateral ventricle. Vascular: See below Skull: Negative Sinuses/Orbits: Clear/normal CTA neck: Aortic arch is normal. Right common carotid artery is widely patent to the bifurcation. Normal carotid bifurcation. Normal cervical ICA. Left common carotid artery widely patent to the bifurcation. Carotid bifurcation is normal. Normal left ICA. Both vertebral arteries widely patent at their origins and through the cervical region to the foramen magnum. CTA HEAD Anterior circulation: Both internal carotid arteries widely patent through the skull base and siphon regions.  The anterior and middle cerebral vessels are normal without proximal stenosis, aneurysm or vascular malformation. Posterior circulation: Both vertebral arteries widely patent to the basilar. No basilar stenosis. Posterior circulation branch vessels are normal. Venous sinuses: See results of CT venography. Anatomic variants: None significant. CT venography of the head Anterior aspect of the superior sagittal sinus is patent. Again demonstrated is partial thrombosis of the superior sagittal sinus at the vertex. This is not occlusive as flow can be demonstrated around the margins of the thrombus. This is fairly segmental, involving a 5-6 cm segment of the sinus. Beyond that, the distal  superior sagittal sinus is patent. Both transverse sinuses are patent. Compared to the initial presentation noncontrast CT and MRI, the thrombus does not appear progressive. Thrombosed superficial draining vein on the left again visible, similar in appearance. IMPRESSION: 1. No arterial pathology in the neck. 2. No intracranial large or medium vessel occlusion or correctable proximal stenosis. 3. Partial thrombosis of the superior sagittal sinus at the vertex, not occlusive as flow can be demonstrated around the margins of the thrombus. This is fairly segmental, involving a 5-6 cm segment of the sinus. Beyond that, the distal superior sagittal sinus is patent. Thrombosed superficial draining vein on the left again visible, similar in appearance. 4. Hemorrhagic infarction in the left parietal cortical and subcortical brain redemonstrated. Low-density edema is slightly increased, particularly along the anterior margin. No new or enlarging bleeds. Mild swelling with mass-effect and 1 or 2 mm of left-to-right shift and flattening of the left lateral ventricle. Electronically Signed   By: Nelson Chimes M.D.   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  1. Does the need for close, 24 hr/day medical supervision in concert with the patient's rehab needs make it unreasonable for this patient to be served in a less intensive setting? Yes 2. Co-Morbidities requiring supervision/potential complications: seizure, hypercoagulable state, OCP use, HLD, hx of ETOH ise, overweight (BMI 28.27) 3. Due to bladder management, bowel management, safety, skin/wound care, disease management, medication administration, pain management and patient education, does the patient require 24 hr/day rehab nursing? Yes 4. Does the patient require coordinated care of a physician, rehab nurse, therapy disciplines of PT, OT, SLP to address  physical and functional deficits in the context of the above medical diagnosis(es)? Yes Addressing deficits in the following areas: balance, endurance, locomotion, strength, transferring, bowel/bladder control, bathing, dressing, feeding, grooming, toileting, cognition and psychosocial support 5. Can the patient actively participate in an intensive therapy program of at least 3 hrs of therapy per day at least 5 days per week? Yes 6. The potential for patient to make measurable gains while on inpatient rehab is excellent 7. Anticipated functional outcomes upon discharge from inpatient rehab are modified independent  with PT, modified independent with OT, modified independent with SLP. 8. Estimated rehab length of stay to reach the above functional goals is: 10-12 days 9. Anticipated discharge destination: Home 10. Overall Rehab/Functional Prognosis: excellent  RECOMMENDATIONS: This patient's condition is appropriate for continued rehabilitative care in the following setting: CIR Patient has agreed to participate in recommended program. Yes Note that insurance prior authorization may be required for reimbursement for recommended care.  Comment: Thank you for this consult. Admission coordinator to follow.   I have personally performed a face to face diagnostic evaluation, including, but not limited to relevant history and physical exam findings, of this patient and developed relevant assessment and  plan.  Additionally, I have reviewed and concur with the physician assistant's documentation above.  Leeroy Cha, MD  Lavon Paganini Halma, PA-C 03/30/2020

## 2020-04-01 NOTE — Plan of Care (Signed)
  Problem: RH SAFETY Goal: RH STG ADHERE TO SAFETY PRECAUTIONS W/ASSISTANCE/DEVICE Description: STG Adhere to Safety Precautions With Mod I Assistance/Device. Outcome: Progressing Goal: RH STG DECREASED RISK OF FALL WITH ASSISTANCE Description: STG Decreased Risk of Fall With Mod I Assistance. Outcome: Progressing   Problem: RH KNOWLEDGE DEFICIT Goal: RH STG INCREASE KNOWLEDGE OF STROKE PROPHYLAXIS Description: Mod i Outcome: Progressing

## 2020-04-01 NOTE — Evaluation (Addendum)
Physical Therapy Assessment and Plan  Patient Details  Name: Erica Rosario MRN: [id number] Date of Birth: [birth year]  PT Diagnosis: Abnormality of gait, Difficulty walking, Dizziness and giddiness and Muscle weakness Rehab Potential: Excellent ELOS: 2-5 days Today's Date: 04/01/2020 PT Individual Time: 1300-1400 PT Individual Time Calculation (min): 60 min    Hospital Problem: Principal Problem:   Thrombosis of superior sagittal sinus Active Problems:   Left-sided nontraumatic intracerebral hemorrhage (HCC)   Cerebral venous sinus thrombosis   Transaminitis   Diarrhea  Past Medical History: History reviewed. No pertinent past medical history. Past Surgical History: History reviewed. No pertinent surgical history.  Assessment & Plan Clinical Impression: Patient is a 44 year old right-handed female unremarkable past medical history no prescription medications.  Per chart review lives with spouse independent prior to admission.  Multilevel home.  [facility] with headache and stiffness of neck as well as blurred vision.  Patient had been vaccinated COVID-06 September 2019 there was reported seizure in the ED.  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  There is a thrombosis of a draining cortical vein in the region of hemorrhage.  2 rounded areas of enhancement within the left occipital hemorrhage possibly representing active ongoing hemorrhage.  CT angiogram of head and neck no arterial pathology of the neck.  No intracranial large or medium vessel occlusion.  Admission chemistries potassium 3.2, AST 48, ALT 55, hemoglobin 9.2.  EEG negative for seizure.  Echocardiogram with ejection fraction of 60 to 65% no wall motion abnormalities.  Neurology follow-up placed on Keppra for seizure prophylaxis.  Intravenous heparin initiated transitioned to Coumadin.   Therapy evaluations completed and patient was admitted for a comprehensive rehab program  Patient currently requires CGA/supervision with mobility secondary to muscle weakness and field cut.  Prior to hospitalization, patient was independent  with mobility and lived with Spouse in a House home. Home access is 3Stairs to enter.  Patient will benefit from skilled PT intervention to maximize safe functional mobility, minimize fall risk and decrease caregiver burden for planned discharge home with 24 hour supervision.  Anticipate patient will not need PT follow up at discharge.  PT - End of Session Activity Tolerance: Tolerates 30+ min activity without fatigue Endurance Deficit: Yes Endurance Deficit Description: Mild, brief, seated rest breaks after long distance gait PT Assessment Rehab Potential (ACUTE/IP ONLY): Excellent PT Barriers to Discharge: Home environment access/layout PT Barriers to Discharge Comments: 3 STE PT Patient demonstrates impairments in the following area(s): Balance;Other (comment) (Vision) PT Transfers Functional Problem(s): Bed Mobility;Car;Bed to Chair;Furniture PT Locomotion Functional Problem(s): Ambulation;Stairs PT Plan PT Intensity: Minimum of 1-2 x/day ,45 to 90 minutes PT Duration Estimated Length of Stay: 2-3 days PT Treatment/Interventions: Ambulation/gait training;Discharge planning;Functional mobility training;Therapeutic Activities;Visual/perceptual remediation/compensation;Therapeutic Exercise;Neuromuscular re-education;Disease management/prevention;Cognitive remediation/compensation;Balance/vestibular training;Pain management;DME/adaptive equipment instruction;UE/LE Strength taining/ROM;UE/LE Coordination activities;Stair training;Patient/family education;Community reintegration PT Transfers Anticipated Outcome(s): supervision PT Locomotion Anticipated Outcome(s): supervision with LRAD PT Recommendation Follow Up Recommendations: 24 hour  supervision/assistance Patient destination: Home Equipment Recommended: None recommended by PT   PT Evaluation Precautions/Restrictions Precautions Precautions: Fall Precaution Comments: right visual field cut; spanish interpreter needed Restrictions Weight Bearing Restrictions: No General Chart Reviewed: Yes Family/Caregiver Present: Yes (husband) Vital SignsTherapy Vitals Pulse Rate: 67 Resp: 18 BP: 113/63 Patient Position (if appropriate): Sitting Oxygen Therapy SpO2: 100 % O2 Device: Room Air Pain Pain Assessment Pain Scale: 0-10 Pain Score: 3  Pain Type: Acute  pain Pain Location: Head Pain Intervention(s): Medication (See eMAR) Home Living/Prior Functioning Home Living Living Arrangements: Spouse/significant other;Children Available Help at Discharge: Family;Available 24 hours/day Type of Home: House Home Access: Stairs to enter [employer] of Steps: 3 Entrance Stairs-Rails: Can reach both;Left;Right Home Layout: Laundry or work area in basement;Two level Alternate Level Stairs-Number of Steps: 7 steps to basement Alternate Level Stairs-Rails: Right;Left Bathroom Shower/Tub: Tub/shower unit;Curtain Bathroom Toilet: Standard Bathroom Accessibility:  (TBD)  Lives With: Spouse Prior Function Level of Independence: Independent with transfers;Independent with gait  Able to Take Stairs?: Yes Driving: Yes 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 Vision/Perception  Vision - Assessment Eye Alignment: Within Functional Limits Ocular Range of Motion: Within Functional Limits Alignment/Gaze Preference: Within Defined Limits Tracking/Visual Pursuits: Able to track stimulus in all quads without difficulty Saccades: Additional eye shifts occurred during testing;Decreased speed of saccadic movement Convergence: Within functional limits Perception Perception: Within Functional Limits Praxis Praxis: Intact  Cognition Overall  Cognitive Status: Within Functional Limits for tasks assessed Arousal/Alertness: Awake/alert Orientation Level: Oriented X4 Attention: Focused;Sustained Focused Attention: Appears intact Sustained Attention: Appears intact Selective Attention: Appears intact Memory: Impaired Memory Impairment: Decreased short term memory Decreased Short Term Memory: Verbal basic Immediate Memory Recall: Sock;Blue;Bed Memory Recall Sock: Without Cue Memory Recall Blue: With Cue Memory Recall Bed: Without Cue Awareness: Appears intact Problem Solving: Appears intact Executive Function: Sequencing;Initiating;Decision Making Sequencing: Appears intact Decision Making: Appears intact Initiating: Appears intact Safety/Judgment: Appears intact Sensation Sensation Light Touch: Appears Intact Hot/Cold: Appears Intact Proprioception: Appears Intact Stereognosis: Not tested Coordination Gross Motor Movements are Fluid and Coordinated: No Coordination and Movement Description: Mild delay, likely contributed to R visual field cut Finger Nose Finger Test: [facility] Heel Shin Test: [facility] Motor  Motor Motor: Hemiplegia Motor - Skilled Clinical Observations: Very mild R sided weakness   Trunk/Postural Assessment  Cervical Assessment Cervical Assessment: Within Functional Limits Thoracic Assessment Thoracic Assessment: Within Functional Limits Lumbar Assessment Lumbar Assessment: Within Functional Limits Postural Control Postural Control: Within Functional Limits  Balance Balance Balance Assessed: Yes Static Sitting Balance Static Sitting - Balance Support: Feet supported Static Sitting - Level of Assistance: 7: Independent Dynamic Sitting Balance Dynamic Sitting - Level of Assistance: 5: Stand by assistance Static Standing Balance Static Standing - Balance Support: During functional activity Static Standing - Level of Assistance: 5: Stand by assistance Dynamic Standing Balance Dynamic Standing -  Balance Support: During functional activity Dynamic Standing - Level of Assistance: 4: Min assist Extremity Assessment  RUE Assessment RUE Assessment: Exceptions to [facility] Passive Range of Motion (PROM) Comments: WNL Active Range of Motion (AROM) Comments: WNL General Strength Comments: Grossly 4+/5 LUE Assessment LUE Assessment: Exceptions to [facility] General Strength Comments: Grossly 5/5 RLE Assessment RLE Assessment: Exceptions to [facility] General Strength Comments: Grossly 5/5 LLE Assessment LLE Assessment: Exceptions to [facility] General Strength Comments: Grossly 5/5  Care Tool Care Tool Bed Mobility Roll left and right activity   Roll left and right assist level: Supervision/Verbal cueing    Sit to lying activity   Sit to lying assist level: Supervision/Verbal cueing    Lying to sitting edge of bed activity   Lying to sitting edge of bed assist level: Supervision/Verbal cueing     Care Tool Transfers Sit to stand transfer   Sit to stand assist level: Contact Guard/Touching assist    Chair/bed transfer   Chair/bed transfer assist level: Minimal Assistance - Patient > 75%     Toilet transfer   Assist Level: Minimal  Assistance - Patient > 75%    Scientist, product/process development transfer activity did not occur: Environmental limitations (car simulator in repair)        Care Tool Locomotion Ambulation   Assist level: Minimal Assistance - Patient > 75% Assistive device: No Device Max distance: 247f  Walk 10 feet activity   Assist level: Minimal Assistance - Patient > 75% Assistive device: No Device   Walk 50 feet with 2 turns activity   Assist level: Minimal Assistance - Patient > 75% Assistive device: No Device  Walk 150 feet activity   Assist level: Minimal Assistance - Patient > 75% Assistive device: No Device  Walk 10 feet on uneven surfaces activity   Assist level: Minimal Assistance - Patient > 75%    Stairs   Assist level: Minimal Assistance - Patient > 75% Stairs assistive  device: 2 hand rails Max number of stairs: 12  Walk up/down 1 step activity   Walk up/down 1 step (curb) assist level: Minimal Assistance - Patient > 75% Walk up/down 1 step or curb assistive device: 2 hand rails    Walk up/down 4 steps activity Walk up/down 4 steps assist level: Minimal Assistance - Patient > 75% Walk up/down 4 steps assistive device: 2 hand rails  Walk up/down 12 steps activity   Walk up/down 12 steps assist level: Minimal Assistance - Patient > 75% Walk up/down 12 steps assistive device: 2 hand rails  Pick up small objects from floor   Pick up small object from the floor assist level: Minimal Assistance - Patient > 75% Pick up small object from the floor assistive device: no device  Wheelchair Will patient use wheelchair at discharge?: No          Wheel 50 feet with 2 turns activity      Wheel 150 feet activity        Refer to Care Plan for Long Term Goals  SHORT TERM GOAL WEEK 1 PT Short Term Goal 1 (Week 1): STG = LTG due to ELOS  Recommendations for other services: None   Skilled Therapeutic Intervention Mobility Bed Mobility Bed Mobility: Rolling Right;Supine to Sit;Rolling Left;Sit to Supine Rolling Right: Supervision/verbal cueing Rolling Left: Supervision/Verbal cueing Supine to Sit: Supervision/Verbal cueing Sit to Supine: Supervision/Verbal cueing Transfers Transfers: Sit to Stand;Stand to Sit;Stand Pivot Transfers Sit to Stand: Contact Guard/Touching assist Stand to Sit: Contact Guard/Touching assist Stand Pivot Transfers: Contact Guard/Touching assist Transfer (Assistive device): None Locomotion  Gait Ambulation: Yes Gait Assistance: Contact Guard/Touching assist Gait Distance (Feet): 250 Feet Assistive device: None Gait Gait: Yes Gait Pattern: Impaired Gait Pattern: Narrow base of support;Decreased stride length;Decreased step length - left;Decreased step length - right Gait velocity: reduced Stairs / Additional  Locomotion Stairs: Yes Stairs Assistance: Contact Guard/Touching assist Stair Management Technique: Two rails Number of Stairs: 12 Height of Stairs: 6 Wheelchair Mobility Wheelchair Mobility: No  Skilled Intervention: Pt received sitting in recliner, agreeable to PT session. Husband at bedside. Interpreter used during session as she is spanish speaking. Pt pleasant and cooperative throughout. Mobility performed as described above. Initially requiring CGA for sit<>stand transfers and gait, progressing to SBA. She ambulated >2027fwith no AD on level surfaces, appears to have good ability to compensate for R visual field cut by scanning. No knee buckling or LOB throughout mobility. Performed TUG, scoring 23.8 seconds, 23.6 seconds, and 23.4 seconds. She ended session seated in recliner with husband present. Instructed on use of call bell for needs and transfers.  Instructed pt  in results of PT evaluation as detailed above, PT POC, rehab potential, rehab goals, and discharge recommendations. Additionally discussed CIR's policies regarding fall safety and use of chair alarm and/or quick release belt. Pt verbalized understanding and in agreement. Will update pt's family members as they become available.   Discharge Criteria: Patient will be discharged from PT if patient refuses treatment 3 consecutive times without medical reason, if treatment goals not met, if there is a change in medical status, if patient makes no progress towards goals or if patient is discharged from hospital.  The above assessment, treatment plan, treatment alternatives and goals were discussed and mutually agreed upon: by patient and husband.  Nathanie Ottley P Kelso Bibby PT, DPT 04/01/2020, 4:03 PM

## 2020-04-01 NOTE — Progress Notes (Addendum)
ANTICOAGULATION CONSULT NOTE - Follow Up Consult  Pharmacy Consult for Heparin and Coumadin Indication: Dural venous sinus thrombosis  No Known Allergies  Patient Measurements: Height: 5' (152.4 cm) (per pt) Weight: 66.2 kg (145 lb 15.1 oz) IBW/kg (Calculated) : 45.5 Heparin Dosing Weight: 60 kg  Vital Signs: Temp: 98.3 F (36.8 C) (10/13 0417) Temp Source: Oral (10/13 0417) BP: 101/52 (10/13 0417) Pulse Rate: 73 (10/13 0417)  Labs: Recent Labs    03/30/20 0046 03/30/20 1524 03/31/20 0323 03/31/20 0933 04/01/20 0651  HGB 8.2*  --  8.3*  --  8.7*  HCT 27.7*  --  27.9*  --  29.4*  PLT 347  --  377  --  429*  LABPROT 19.1*  --  20.3*  --  22.4*  INR 1.7*  --  1.8*  --  2.0*  HEPARINUNFRC 0.30   < > 0.50 0.37 0.25*  CREATININE 0.67  --  0.70  --  0.70   < > = values in this interval not displayed.    Estimated Creatinine Clearance: 75.4 mL/min (by C-G formula based on SCr of 0.7 mg/dL).   Assessment:  Anticoag: Heparin for venous sinus thrombosis with hemorrhagic transformation + Bilateral renal vein nonocclusive thrombosis, low goal and no bolus. Started at [facility]. - Heparin level subtherapeutic at 0.25 on 650 units/hr. INR therapeutic at 2. No bleeding noted, Hgb stable 8s, platelets are normal. No problems with infusion or bleeding per RN.  Goal of Therapy:  INR 2-3 Heparin level 0.3-0.5 units/ml Monitor platelets by anticoagulation protocol: Yes   Plan:  Increase heparin drip to 750 units/hr - consider d/c if INR >2 tomorrow Coumadin 4 mg PO tonight Daily heparin level, CBC, and INR Monitor for s/sx of bleeding Coumadin education complete   Thank you for involving pharmacy in this patient's care.  Renold Genta, PharmD, BCPS Clinical Pharmacist Clinical phone for 04/01/2020 until 3p is [phone] 04/01/2020 8:50 AM  **Pharmacist phone directory can be found on [email or web address] listed under [place]**

## 2020-04-01 NOTE — Evaluation (Addendum)
Occupational Therapy Assessment and Plan  Patient Details  Name: KIRSTAN FENTRESS MRN: [id number] Date of Birth: 09/01/1975  OT Diagnosis: cognitive deficits, disturbance of vision and hemiplegia affecting dominant side Rehab Potential: Rehab Potential (ACUTE ONLY): Excellent ELOS: 2-5 days  Today's Date: 04/01/2020 OT Individual Time: 1000-1100 OT Individual Time Calculation (min): 60 min     Hospital Problem: Principal Problem:   Left-sided nontraumatic intracerebral hemorrhage ([place]) Active Problems:   Cerebral venous sinus thrombosis   Past Medical History: History reviewed. No pertinent past medical history. Past Surgical History: History reviewed. No pertinent surgical history.  Assessment & Plan Clinical Impression: SHAILYN WEYANDT is a 44 year old right-handed female unremarkable past medical history no prescription medications.  Per chart review lives with spouse independent prior to admission.  Multilevel home.  [facility] with headache and stiffness of neck as well as blurred vision.  Patient had been vaccinated COVID-06 September 2019 there was reported seizure in the ED.  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  There is a thrombosis of a draining cortical vein in the region of hemorrhage.  2 rounded areas of enhancement within the left occipital hemorrhage possibly representing active ongoing hemorrhage.  CT angiogram of head and neck no arterial pathology of the neck.  No intracranial large or medium vessel occlusion.  Admission chemistries potassium 3.2, AST 48, ALT 55, hemoglobin 9.2.  EEG negative for seizure.  Echocardiogram with ejection fraction of 60 to 65% no wall motion abnormalities.  Neurology follow-up placed on Keppra for seizure prophylaxis.  Intravenous heparin initiated transitioned to Coumadin.   Patient transferred to CIR on  03/31/2020 .    Patient currently requires min with basic self-care skills secondary to muscle weakness, decreased cardiorespiratoy endurance, decreased coordination and decreased motor planning, decreased visual motor skills, field cut and hemianopsia, decreased memory and decreased sitting balance, decreased standing balance and decreased balance strategies.  Prior to hospitalization, patient could complete ADLs and IADls with independent .  Patient will benefit from skilled intervention to increase independence with basic self-care skills prior to discharge home with care partner.  Anticipate patient will require intermittent supervision and HH vs OP OT.  OT - End of Session Activity Tolerance: Tolerates 30+ min activity with multiple rests Endurance Deficit: Yes Endurance Deficit Description: min intermittent seated RBs needed during self care OT Assessment Rehab Potential (ACUTE ONLY): Excellent OT Patient demonstrates impairments in the following area(s): Balance;Cognition;Endurance;Vision;Motor OT Basic ADL's Functional Problem(s): Grooming;Bathing;Dressing;Eating;Toileting OT Advanced ADL's Functional Problem(s): Simple Meal Preparation OT Transfers Functional Problem(s): Toilet;Tub/Shower OT Additional Impairment(s): Fuctional Use of Upper Extremity OT Plan OT Intensity: Minimum of 1-2 x/day, 45 to 90 minutes OT Frequency: 5 out of 7 days OT Duration/Estimated Length of Stay: 7 days OT Treatment/Interventions: Balance/vestibular training;Discharge planning;Self Care/advanced ADL retraining;Therapeutic Activities;UE/LE Coordination activities;Cognitive remediation/compensation;Disease mangement/prevention;Functional mobility training;Patient/family education;Therapeutic Exercise;Visual/perceptual remediation/compensation;DME/adaptive equipment instruction;Neuromuscular re-education;Psychosocial support;UE/LE Strength taining/ROM OT Self Feeding Anticipated Outcome(s): mod I OT Basic  Self-Care Anticipated Outcome(s): mod I to supervision OT Toileting Anticipated Outcome(s): mod I OT Bathroom Transfers Anticipated Outcome(s): mod I OT Recommendation Patient destination: Home Follow Up Recommendations: Other (comment) (intermittent supervision; HH vs OP OT) Equipment Recommended: To be determined   OT Evaluation Precautions/Restrictions  Restrictions Weight Bearing Restrictions: No General Chart Reviewed: Yes  Pain Pain Assessment Pain Scale: 0-10 Pain Score: 0-No pain Home Living/Prior Functioning Home Living Family/patient expects to be discharged to::  Private residence Living Arrangements: Spouse/significant other, Children Available Help at Discharge: Family, Available 24 hours/day Type of Home: House Home Access: Level entry [place]: Laundry or work area in basement, Two level Alternate Level Stairs-Number of Steps: 7 steps to basement Alternate Level Stairs-Rails: Right, Left Bathroom Shower/Tub: Tub/shower unit, Architectural technologist: Standard Bathroom Accessibility:  (TBD)  Lives With: Spouse IADL History Homemaking Responsibilities: Yes Meal Prep Responsibility: Primary Laundry Responsibility: Primary Cleaning Responsibility: Primary Child Care Responsibility: Primary Occupation: Works at home 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 Prior Function Level of Independence: Independent with basic ADLs, Independent with homemaking with ambulation, Independent with gait, Independent with transfers  Able to [place]?: Yes Driving: Yes Vision Baseline Vision/History: Wears glasses Wears Glasses: Reading only Patient Visual Report: Peripheral vision impairment Vision Assessment?: Yes Eye Alignment: Within Functional Limits Ocular Range of Motion: Within Functional Limits Alignment/Gaze Preference: Within Defined Limits Tracking/Visual Pursuits: Able to track stimulus in all quads without difficulty Saccades: Additional eye shifts  occurred during testing;Decreased speed of saccadic movement Convergence: Within functional limits Visual Fields: Right homonymous hemianopsia Perception  Perception: Within Functional Limits Praxis Praxis: Intact Cognition Overall Cognitive Status: Within Functional Limits for tasks assessed Arousal/Alertness: Awake/alert Orientation Level: Person;Situation;Place Person: Oriented Place: Oriented Situation: Oriented Year: 2021 Month: October Day of Week: Correct Memory: Impaired Memory Impairment: Decreased short term memory Decreased Short Term Memory: Verbal basic Immediate Memory Recall: Sock;Blue;Bed Memory Recall Sock: Without Cue Memory Recall Blue: With Cue Memory Recall Bed: Without Cue Attention: Focused;Sustained;Selective Focused Attention: Appears intact Sustained Attention: Appears intact Selective Attention: Appears intact Awareness: Appears intact Problem Solving: Appears intact Executive Function: Sequencing;Initiating;Decision Making Sequencing: Appears intact Decision Making: Appears intact Initiating: Appears intact Safety/Judgment: Appears intact Sensation Sensation Light Touch: Appears Intact Hot/Cold: Appears Intact Proprioception: Appears Intact Stereognosis: Not tested Coordination Gross Motor Movements are Fluid and Coordinated: No Finger Nose Finger Test: Mild impairment RUE; WNL LUE Motor  Motor Motor: Hemiplegia Motor - Skilled Clinical Observations: Mild right sided weakness  Trunk/Postural Assessment  Cervical Assessment Cervical Assessment: Within Functional Limits Thoracic Assessment Thoracic Assessment: Within Functional Limits Lumbar Assessment Lumbar Assessment: Within Functional Limits Postural Control Postural Control: Within Functional Limits  Balance Balance Balance Assessed: Yes Static Sitting Balance Static Sitting - Level of Assistance: 7: Independent Dynamic Sitting Balance Dynamic Sitting - Level of Assistance:  5: Stand by assistance Static Standing Balance Static Standing - Level of Assistance: 5: Stand by assistance Extremity/Trunk Assessment RUE Assessment RUE Assessment: Exceptions to [facility] Passive Range of Motion (PROM) Comments: WNL Active Range of Motion (AROM) Comments: WNL General Strength Comments: 4-/5 right shoulder; WFL elbow, wrist, hand LUE Assessment LUE Assessment: Within Functional Limits  Care Tool Care Tool Self Care Eating   Eating Assist Level: Set up assist    Oral Care    Oral Care Assist Level: Supervision/Verbal cueing    Bathing   Body parts bathed by patient: Right arm;Left arm;Chest;Abdomen;Front perineal area;Buttocks;Right upper leg;Left upper leg;Right lower leg;Left lower leg;Face     Assist Level: Contact Guard/Touching assist    Upper Body Dressing(including orthotics)   What is the patient wearing?: Pull over shirt;Bra   Assist Level: Supervision/Verbal cueing    Lower Body Dressing (excluding footwear)   What is the patient wearing?: Underwear/pull up;Pants Assist for lower body dressing: Contact Guard/Touching assist    Putting on/Taking off footwear   What is the patient wearing?: Socks Assist for footwear: Supervision/Verbal cueing       Care Tool Toileting  Toileting activity   Assist for toileting: Contact Guard/Touching assist     Care Tool Bed Mobility Roll left and right activity        Sit to lying activity        Lying to sitting edge of bed activity         Care Tool Transfers Sit to stand transfer        Chair/bed transfer         Toilet transfer   Assist Level: Minimal Assistance - Patient > 75%     Care Tool Cognition Expression of Ideas and Wants Expression of Ideas and Wants: Without difficulty (complex and basic) - expresses complex messages without difficulty and with speech that is clear and easy to understand   Understanding Verbal and Non-Verbal Content Understanding Verbal and Non-Verbal Content:  Understands (complex and basic) - clear comprehension without cues or repetitions   Memory/Recall Ability *first 3 days only Memory/Recall Ability *first 3 days only: Location of own room;Current season;Staff names and faces;That he or she is in a hospital/hospital unit    Refer to Care Plan for [place] 1 OT Short Term Goal 1 (Week 1): STGs=LTGs dt ELOS  Recommendations for other services: None    Skilled Therapeutic Intervention ADL ADL Grooming: Supervision/safety Where Assessed-Grooming: Standing at sink Upper Body Bathing: Minimal assistance Where Assessed-Upper Body Bathing: Sitting at sink Lower Body Bathing: Contact guard Where Assessed-Lower Body Bathing: Standing at sink;Sitting at sink Upper Body Dressing: Supervision/safety Where Assessed-Upper Body Dressing: Sitting at sink Lower Body Dressing: Contact guard Where Assessed-Lower Body Dressing: Sitting at sink;Standing at sink Mobility  Bed Mobility Bed Mobility: Rolling Right;Supine to Sit Rolling Right: Supervision/verbal cueing Supine to Sit: Contact Guard/Touching assist   Skilled Intervention:  Pt supine in bed, no c/o pain, agreeable to OT session.  Interpreter present throughout for communication assistance.  Initial evaluation completed, educated pt on OT scope of practice, collaborated with pt regarding OT POC, and initiated dc planning.  Pt completed self care and functional mobility per above levels of assist.  Pt ended seated in recliner with husband present, call bell in reach, seat belt alarm on.     Discharge Criteria: Patient will be discharged from OT if patient refuses treatment 3 consecutive times without medical reason, if treatment goals not met, if there is a change in medical status, if patient makes no progress towards goals or if patient is discharged from hospital.  The above assessment, treatment plan, treatment alternatives and goals were discussed and mutually  agreed upon: by patient  Ezekiel Slocumb 04/01/2020, 9:35 AM

## 2020-04-01 NOTE — Progress Notes (Signed)
[facility] Individual Statement of Services  Patient Name:  Erica Rosario  Date:  04/01/2020  Welcome to the [facility].  Our goal is to provide you with an individualized program based on your diagnosis and situation, designed to meet your specific needs.  With this comprehensive rehabilitation program, you will be expected to participate in at least 3 hours of rehabilitation therapies Monday-Friday, with modified therapy programming on the weekends.  Your rehabilitation program will include the following services:  Physical Therapy (PT), Occupational Therapy (OT), Speech Therapy (ST), 24 hour per day rehabilitation nursing, Neuropsychology, Care Coordinator, Rehabilitation Medicine, Nutrition Services and Pharmacy Services  Weekly team conferences will be held on Wednesday to discuss your progress.  Your Inpatient Rehabilitation Care Coordinator will talk with you frequently to get your input and to update you on team discussions.  Team conferences with you and your family in attendance may also be held.  Expected length of stay: 3-5 days  Overall anticipated outcome: supervision-mod/i level  Depending on your progress and recovery, your program may change. Your Inpatient Rehabilitation Care Coordinator will coordinate services and will keep you informed of any changes. Your Inpatient Rehabilitation Care Coordinator's name and contact numbers are listed  below.  The following services may also be recommended but are not provided by the [facility] will be made to provide these services after discharge if needed.  Arrangements include referral to agencies that provide these services.  Your insurance has been verified to be: Uninsured Your primary doctor is:  [facility]  Pertinent information will be shared with your doctor and your insurance company.  Inpatient Rehabilitation Care Coordinator:  Ovidio Kin, [place] or Emilia Beck  Information discussed with and copy given to patient by: Elease Hashimoto, 04/01/2020, 12:38 PM

## 2020-04-01 NOTE — Progress Notes (Signed)
Occupational Therapy Session Note  Patient Details  Name: Erica Rosario MRN: [id number] Date of Birth: [birth year]  Today's Date: 04/01/2020 OT Individual Time: 1500-1530 OT Individual Time Calculation (min): 30 min    Short Term Goals: Week 1:  OT Short Term Goal 1 (Week 1): STGs=LTGs dt ELOS  Skilled Therapeutic Interventions/Progress Updates:  Pt supine in bed, weeping.  Pt reports her children didn't have anyone to pick them up from school, but issue was recently resolved and everything is okay now.  Pt with no c/o pain, agreeable to OT session, interpreter present using stratus throughout.  Pt participated in fine motor assessments including Box and Blocks and 9 hole peg test.  Box and Blocks results: Left 32 blocks Right 16 blocks (1 minute);  Peg Test: Left 22 secs Right 48 secs.  Pt exhibits decreased speed and precision during fine motor tasks left hand compared to right hand.  Right visual field cut also impacting performance.  Pt observed self compensating with right head rotation.  Call bell in reach, bed alarm on.  Therapy Documentation Precautions:  Precautions Precautions: Fall Precaution Comments: right visual field cut; spanish interpreter needed Restrictions Weight Bearing Restrictions: No   Therapy/Group: Individual Therapy  Ezekiel Slocumb 04/01/2020, 3:47 PM

## 2020-04-01 NOTE — Progress Notes (Signed)
Inpatient Rehabilitation  Patient information reviewed and entered into eRehab system by Toula Miyasaki M. Linnette Panella, M.A., CCC/SLP, PPS Coordinator.  Information including medical coding, functional ability and quality indicators will be reviewed and updated through discharge.    

## 2020-04-02 ENCOUNTER — Inpatient Hospital Stay (HOSPITAL_COMMUNITY): Payer: Self-pay | Admitting: Speech Pathology

## 2020-04-02 ENCOUNTER — Other Ambulatory Visit (HOSPITAL_COMMUNITY): Payer: Self-pay | Admitting: Physician Assistant

## 2020-04-02 ENCOUNTER — Inpatient Hospital Stay (HOSPITAL_COMMUNITY): Payer: Self-pay

## 2020-04-02 ENCOUNTER — Inpatient Hospital Stay (HOSPITAL_COMMUNITY): Payer: Self-pay | Admitting: Occupational Therapy

## 2020-04-02 DIAGNOSIS — I610 Nontraumatic intracerebral hemorrhage in hemisphere, subcortical: Secondary | ICD-10-CM

## 2020-04-02 DIAGNOSIS — G441 Vascular headache, not elsewhere classified: Secondary | ICD-10-CM

## 2020-04-02 DIAGNOSIS — R110 Nausea: Secondary | ICD-10-CM

## 2020-04-02 LAB — CBC
HCT: 28.7 % — ABNORMAL LOW (ref 36.0–46.0)
Hemoglobin: 8.8 g/dL — ABNORMAL LOW (ref 12.0–15.0)
MCH: 22.7 pg — ABNORMAL LOW (ref 26.0–34.0)
MCHC: 30.7 g/dL (ref 30.0–36.0)
MCV: 74 fL — ABNORMAL LOW (ref 80.0–100.0)
Platelets: 427 10*3/uL — ABNORMAL HIGH (ref 150–400)
RBC: 3.88 MIL/uL (ref 3.87–5.11)
RDW: 18.6 % — ABNORMAL HIGH (ref 11.5–15.5)
WBC: 6.8 10*3/uL (ref 4.0–10.5)
nRBC: 0.3 % — ABNORMAL HIGH (ref 0.0–0.2)

## 2020-04-02 LAB — COMPREHENSIVE METABOLIC PANEL
ALT: 229 U/L — ABNORMAL HIGH (ref 0–44)
AST: 111 U/L — ABNORMAL HIGH (ref 15–41)
Albumin: 3.4 g/dL — ABNORMAL LOW (ref 3.5–5.0)
Alkaline Phosphatase: 82 U/L (ref 38–126)
Anion gap: 11 (ref 5–15)
BUN: 9 mg/dL (ref 6–20)
CO2: 23 mmol/L (ref 22–32)
Calcium: 8.9 mg/dL (ref 8.9–10.3)
Chloride: 104 mmol/L (ref 98–111)
Creatinine, Ser: 0.68 mg/dL (ref 0.44–1.00)
GFR, Estimated: >60 mL/min (ref >60)
Glucose, Bld: 94 mg/dL (ref 70–99)
Potassium: 3.9 mmol/L (ref 3.5–5.1)
Sodium: 138 mmol/L (ref 135–145)
Total Bilirubin: 0.4 mg/dL (ref 0.3–1.2)
Total Protein: 6.9 g/dL (ref 6.5–8.1)

## 2020-04-02 LAB — HEPARIN LEVEL (UNFRACTIONATED): Heparin Unfractionated: 0.67 IU/mL (ref 0.30–0.70)

## 2020-04-02 LAB — PROTIME-INR
INR: 2.3 — ABNORMAL HIGH (ref 0.8–1.2)
Prothrombin Time: 24.7 seconds — ABNORMAL HIGH (ref 11.4–15.2)

## 2020-04-02 MED ORDER — BUTALBITAL-APAP-CAFFEINE 50-325-40 MG PO TABS: 1.0000 | ORAL_TABLET | Freq: Three times a day (TID) | ORAL | 0 refills | Status: DC | PRN

## 2020-04-02 MED ORDER — ONDANSETRON HCL 4 MG/2ML IJ SOLN: 4.0000 mg | Freq: Four times a day (QID) | INTRAMUSCULAR | Status: DC | PRN

## 2020-04-02 MED ORDER — LEVETIRACETAM 500 MG PO TABS: 500.0000 mg | ORAL_TABLET | Freq: Two times a day (BID) | ORAL | 0 refills | Status: DC

## 2020-04-02 MED ORDER — ONDANSETRON 4 MG PO TBDP
4.0000 mg | ORAL_TABLET | Freq: Three times a day (TID) | ORAL | Status: DC | PRN
  Administered 2020-04-02 – 2020-04-03 (×2): 4 mg via ORAL
  Filled 2020-04-02 (×2): qty 1

## 2020-04-02 MED ORDER — ATORVASTATIN CALCIUM 40 MG PO TABS: 40.0000 mg | ORAL_TABLET | Freq: Every day | ORAL | 0 refills | Status: DC

## 2020-04-02 MED ORDER — PANTOPRAZOLE SODIUM 40 MG PO TBEC: 40.0000 mg | DELAYED_RELEASE_TABLET | Freq: Every day | ORAL | 0 refills | Status: DC

## 2020-04-02 MED ORDER — TOPIRAMATE 25 MG PO TABS
25.0000 mg | ORAL_TABLET | Freq: Every evening | ORAL | Status: DC | PRN
  Filled 2020-04-02: qty 1

## 2020-04-02 MED ORDER — WARFARIN SODIUM 4 MG PO TABS
4.0000 mg | ORAL_TABLET | Freq: Once | ORAL | Status: AC
  Administered 2020-04-02: 4 mg via ORAL
  Filled 2020-04-02: qty 1

## 2020-04-02 MED ORDER — TOPIRAMATE 25 MG PO TABS
25.0000 mg | ORAL_TABLET | Freq: Once | ORAL | Status: AC
  Administered 2020-04-02: 25 mg via ORAL

## 2020-04-02 MED FILL — levETIRAcetam 500 MG TABS: 500 | 30 days supply | Qty: 60 | Fill #0

## 2020-04-02 MED FILL — PANTOPRAZOLE SOD DR 40 MG T: 40 | 30 days supply | Qty: 30 | Fill #0

## 2020-04-02 MED FILL — ATORVASTATIN CALCIUM 40 MG: 40 | 30 days supply | Qty: 30 | Fill #0

## 2020-04-02 MED FILL — BUTALB-ACETAMIN-CAFF 50-325: 50-325-40 | 4 days supply | Qty: 14 | Fill #0

## 2020-04-02 NOTE — Progress Notes (Signed)
Patient ID: Erica Rosario, female   DOB: 04-22-1976, 44 y.o.   MRN: [id number] Met with the patient to review role of the nurse case manager and collaboration with the social worker to facilitate preparation for discharge. Reviewed risk factors and general secondary stroke prevention information. Patient given handouts on coumadin and bleeding precautions, along with vit k foods and coumadin interactions. Patient reported that she felt comfortable going home, everyone had been explaining information to her using the interpreters and she denied questions. Margarito Liner

## 2020-04-02 NOTE — Progress Notes (Signed)
Inpatient Rehabilitation Care Coordinator  Discharge Note  The overall goal for the admission was met for:   Discharge location: [place]  Length of Stay: Yes-3 DAYS  Discharge activity level: Yes-SUPERVISION-MOD/I LEVEL  Home/community participation: Yes  Services provided included: MD, RD, PT, OT, SLP, RN, CM, Pharmacy and SW  Financial Services: Other: MEDICAID PENDING  Follow-up services arranged: Other: HOME EXERCISE PROGRAM NO EQUIPMENT NEEDED  Comments (or additional information):HUSBAND HERE AND ATTENDED THERAPIES WITH PT, BOTH FELT ABLE TO GO HOME AND CONCERNED ABOUT THE BILL, WITH HER BEING SELF PAY. GAVE BILL ASSISTANCE INFORMATION  Patient/Family verbalized understanding of follow-up arrangements: Yes  Individual responsible for coordination of the follow-up plan:   Confirmed correct DME delivered: Elease Hashimoto 04/02/2020    Elease Hashimoto

## 2020-04-02 NOTE — [place] Notes (Signed)
[facility] PHYSICAL MEDICINE & REHABILITATION PROGRESS NOTE  Subjective/Complaints: Patient seen sitting up working with therapy this morning.  Good sitting balance noted.  Interpreter present.  She states she slept well overnight.  She notes improvement in diarrhea.  Later informed by nursing regarding nausea.  ROS: Right visual field cut denies CP, SOB, N/V at the time of evaluation  Objective: Vital Signs: Blood pressure 110/66, pulse 79, temperature 98.2 F (36.8 C), resp. rate 18, height 5' (1.524 m), weight 66.2 kg, SpO2 100 %. No results found. Recent Labs    04/01/20 0651 04/02/20 0558  WBC 6.6 6.8  HGB 8.7* 8.8*  HCT 29.4* 28.7*  PLT 429* 427*   Recent Labs    04/01/20 0651 04/02/20 0558  NA 139 138  K 3.8 3.9  CL 106 104  CO2 22 23  GLUCOSE 85 94  BUN 6 9  CREATININE 0.70 0.68  CALCIUM 9.0 8.9    Intake/Output Summary (Last 24 hours) at 04/02/2020 1138 Last data filed at 04/02/2020 0800 Gross per 24 hour  Intake 440 ml  Output 1 ml  Net 439 ml        Physical Exam: BP 110/66 (BP Location: Left Arm)   Pulse 79   Temp 98.2 F (36.8 C)   Resp 18   Ht 5' (1.524 m) Comment: per pt  Wt 66.2 kg   SpO2 100%   BMI 28.50 kg/m  Constitutional: No distress . Vital signs reviewed. HENT: Normocephalic.  Atraumatic. Eyes: EOMI. No discharge. Cardiovascular: No JVD.  RRR. Respiratory: Normal effort.  No stridor.  Bilateral clear to auscultation. GI: Non-distended.  BS +. Skin: Warm and dry.  Intact. Psych: Normal mood.  Normal behavior. Musc: No edema in extremities.  No tenderness in extremities. Neuro: Alert Right visual field cut Motor: LUE/LE: 5/5 proximal distal LUE: 4+-5/5 proximal distal with apraxia Dysmetria Left lower extremity: 5/5 proximal to distal  Assessment/Plan: 1. 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  Physiatrist is providing close team supervision and 24 hour management of active medical problems listed below.  Physiatrist and rehab team continue to assess barriers to discharge/monitor patient progress toward functional and medical goals   Care Tool:  Bathing    Body parts bathed by patient: Right arm, Left arm, Chest, Abdomen, Front perineal area, Buttocks, Right upper leg, Left upper leg, Right lower leg, Left lower leg, Face         Bathing assist Assist Level: Contact Guard/Touching assist     Upper Body Dressing/Undressing Upper body dressing   What is the patient wearing?: Pull over shirt, Bra    Upper body assist Assist Level: Supervision/Verbal cueing    Lower Body Dressing/Undressing Lower body dressing      What is the patient wearing?: Underwear/pull up, Pants     Lower body assist Assist for lower body dressing: Contact Guard/Touching assist     Toileting Toileting    Toileting assist Assist for toileting: Contact Guard/Touching assist     Transfers Chair/bed transfer  Transfers assist     Chair/bed transfer assist level: Minimal Assistance - Patient > 75%     Locomotion Ambulation   Ambulation assist      Assist level: Minimal Assistance - Patient > 75% Assistive device: No Device Max distance: 280ft   Walk 10 feet activity   Assist  Assist level: Minimal Assistance - Patient > 75% Assistive device: No Device   Walk 50 feet activity   Assist    Assist level: Minimal Assistance - Patient > 75% Assistive device: No Device    Walk 150 feet activity   Assist    Assist level: Minimal Assistance - Patient > 75% Assistive device: No Device    Walk 10 feet on uneven surface  activity   Assist     Assist level: Minimal Assistance - Patient > 75%     Wheelchair     Assist Will patient use wheelchair at discharge?: No             Wheelchair 50  feet with 2 turns activity    Assist            Wheelchair 150 feet activity     Assist           Medical Problem List and Plan: 1.  Decreased functional mobility with headache secondary to superior sagittal sinus thrombosis with resultant venous infarct and hemorrhagic conversion  Continue CIR  Patient uninsured and concerned about cost, will plan to discharge tomorrow 2.  Antithrombotics: -DVT/anticoagulation: Heparin GGT transitioned to Coumadin, discussed with pharmacy  INR therapeutic on 10/14             -antiplatelet therapy: N/A 3. Pain Management/vascular headache: Fioricet as needed, plan to wean  Topamax 25 nightly started on 10/14 4. Mood: Provide emotional support             -antipsychotic agents: N/A 5. Neuropsych: This patient is capable of making decisions on her own behalf. 6. Skin/Wound Care: Routine skin checks 7. Fluids/Electrolytes/Nutrition: Routine in and outs. 8.  Seizure prophylaxis.    Keppra 500 mg twice daily.  9.  Hyperlipidemia.    Lipitor 10. Disposition: Lives with husband and two sons aged 15 and 65. Husband can take time off work if needed to care for her.  11.  Diarrhea  Improving 12.  Anemia-multifactorial  Hemoglobin 8.8 on 10/14 13.  Transaminitis  Remains elevated, but improving on 10/14 14.  Nausea  As needed Zofran  LOS: 2 days A FACE TO FACE EVALUATION WAS PERFORMED  Erica Rosario Erica Rosario 04/02/2020, 11:38 AM

## 2020-04-02 NOTE — Progress Notes (Signed)
Occupational Therapy Discharge Summary  Patient Details  Name: Erica Rosario MRN: [id number] Date of Birth: [birth year]  Today's Date: 04/02/2020 OT Individual Time: 1000-1100 OT Individual Time Calculation (min): 60 min    Patient has met 11 of 12 long term goals due to improved balance, ability to compensate for deficits, functional use of  RIGHT upper extremity and improved awareness.  Patient to discharge at overall Supervision level.  Patient's care partner is independent to provide the necessary cognitive assistance at discharge.    Reasons goals not met: Pt requires supervision for ambulation to toilet for toilet transfer due to right visual field cut.    Recommendation:  Patient will benefit from thorough HEP for RUE strength and fine motor coordination.  Equipment: No equipment provided. Recommended shower chair; pt reports they will purchase themselves.  Reasons for discharge: treatment goals met and discharge from hospital  Patient/family agrees with progress made and goals achieved: Yes   Skilled Intervention:  Pt sitting up in bed, husband and interpreter present throughout session for communication and family education.  Pt completed bed mobility independently and ambulated and toilet transfer with supervision and toileting with independence.  Pt ambulated to ADL suite bathroom with supervision for occasional cues to avoid obstacles on right.  Educated pt and husband on safe tub transfer stepping over lip of tub and sitting at shower chair.  Pts husband provided close supervision while pt completed tub transfer using grab bar.  Pts husband reports their tub also has grab bar.  Pt doffed all clothing with independence and bathed sitting on shower chair with independence.  Pt donned clean clothing independently and transferred out of tub with close supervision provided by husband.  Pt brushed teeth using right dominant hand standing sinkside with independence.  Pt ambulated back  to room with close supervision for avoidance of obstacles.  Stand to sit at recliner with independence.  Educated pts husband on pts current functional and performance defecits and need for close supervision during meal prep due to memory and right sided visual impairments.  Pts husband reported good understanding and eager to assist pt upon dc as needed.  Provided pt with HEP handout and verbally reviewed for further training to be completed with PT.  Call bell in reach, husband present in room.    OT Discharge Precautions/Restrictions  Precautions Precautions: Fall Precaution Comments: right visual field cut; spanish interpreter needed Restrictions Weight Bearing Restrictions: No Pain Pain Assessment Pain Scale: 0-10 Pain Score: 5  Pain Type: Acute pain Pain Location: Head Pain Descriptors / Indicators: Headache Pain Frequency: Intermittent Patients Stated Pain Goal: 2 Pain Intervention(s): Medication (See eMAR) ADL ADL Eating: Independent Where Assessed-Eating: Chair Grooming: Independent Where Assessed-Grooming: Standing at sink Upper Body Bathing: Modified independent Where Assessed-Upper Body Bathing: Shower (sitting on shower chair) Lower Body Bathing: Modified independent Where Assessed-Lower Body Bathing: Shower (sitting on shower chair) Upper Body Dressing: Independent Where Assessed-Upper Body Dressing: Sitting at sink Lower Body Dressing: Modified independent Where Assessed-Lower Body Dressing: Other (Comment) (sitting and standing at shower chair) Toileting: Independent Where Assessed-Toileting: Glass blower/designer: Close supervision Toilet Transfer Method: Ambulating Tub/Shower Transfer: Close supervison Clinical cytogeneticist Method: Optometrist: Civil engineer, contracting with back Social research officer, government: Not assessed Vision Baseline Vision/History: Wears glasses Wears Glasses: Reading only Patient Visual Report: Peripheral vision impairment Vision  Assessment?: Yes Eye Alignment: Within Functional Limits Ocular Range of Motion: Within Functional Limits Alignment/Gaze Preference: Within Defined Limits Tracking/Visual Pursuits: Able to track stimulus in all quads  without difficulty Saccades: Additional eye shifts occurred during testing;Decreased speed of saccadic movement Convergence: Within functional limits Visual Fields: Right homonymous hemianopsia Perception  Perception: Within Functional Limits Praxis Praxis: Intact Cognition Overall Cognitive Status: Impaired/Different from baseline Arousal/Alertness: Awake/alert Orientation Level: Oriented X4 Attention: Selective;Sustained Focused Attention: Appears intact Sustained Attention: Appears intact Selective Attention: Appears intact Memory: Impaired Memory Impairment: Decreased short term memory;Storage deficit;Retrieval deficit Decreased Short Term Memory: Functional basic Awareness: Appears intact Problem Solving: Impaired Problem Solving Impairment: Functional complex Reasoning: Appears intact Sequencing: Appears intact Organizing: Appears intact Decision Making: Appears intact Initiating: Appears intact Safety/Judgment: Appears intact Sensation Sensation Light Touch: Appears Intact Hot/Cold: Appears Intact Proprioception: Appears Intact Stereognosis: Not tested Coordination Gross Motor Movements are Fluid and Coordinated: No Fine Motor Movements are Fluid and Coordinated: No Coordination and Movement Description: Mild delay, likely contributed to R visual field cut Finger Nose Finger Test: decreased precision and slow movement Right; WNL left Heel Shin Test: [facility] Motor  Motor Motor: Hemiplegia Motor - Skilled Clinical Observations: Very mild R sided weakness Mobility  Bed Mobility Bed Mobility: Rolling Right;Supine to Sit;Rolling Left;Sit to Supine Rolling Right: Independent Rolling Left: Independent Supine to Sit: Independent Sit to Supine:  Independent Transfers Sit to Stand: Independent Stand to Sit: Independent  Trunk/Postural Assessment  Cervical Assessment Cervical Assessment: Within Functional Limits Thoracic Assessment Thoracic Assessment: Within Functional Limits Lumbar Assessment Lumbar Assessment: Within Functional Limits Postural Control Postural Control: Within Functional Limits  Balance Balance Balance Assessed: Yes Static Sitting Balance Static Sitting - Balance Support: Feet unsupported Static Sitting - Level of Assistance: 7: Independent Dynamic Sitting Balance Dynamic Sitting - Level of Assistance: 7: Independent Static Standing Balance Static Standing - Balance Support: During functional activity Static Standing - Level of Assistance: 7: Independent Dynamic Standing Balance Dynamic Standing - Balance Support: During functional activity Dynamic Standing - Level of Assistance: 5: Stand by assistance Extremity/Trunk Assessment RUE Assessment RUE Assessment: Exceptions to [facility] Passive Range of Motion (PROM) Comments: WNL Active Range of Motion (AROM) Comments: WNL General Strength Comments: Grossly 4+/5 LUE Assessment LUE Assessment: Within Functional Limits General Strength Comments: Grossly 5/5   Ezekiel Slocumb 04/02/2020, 3:53 PM

## 2020-04-02 NOTE — Discharge Summary (Addendum)
Physician Discharge Summary  Patient ID: Erica Rosario MRN: [id number] DOB/AGE: January 30, 1976 44 y.o.  Admit date: 03/31/2020 Discharge date: 04/03/2020  Discharge Diagnoses:  Principal Problem:   Thrombosis of superior sagittal sinus Active Problems:   Left-sided nontraumatic intracerebral hemorrhage (HCC)   Cerebral venous sinus thrombosis   Transaminitis   Diarrhea   Vascular headache   Nausea without vomiting DVT prophylaxis Seizure prophylaxis  Discharged Condition: Stable  Significant Diagnostic Studies: CT ANGIO HEAD W OR WO CONTRAST  Result Date: 03/28/2020 CLINICAL DATA:  Follow-up venous sinus thrombosis with left parietal venous infarction. EXAM: CT HEAD WITHOUT CONTRAST CT arteriography and venography OF THE HEAD and neck TECHNIQUE: Contiguous axial images were obtained from the base of the skull through the vertex without intravenous contrast. Multidetector CT imaging of the head was performed using the standard protocol during bolus administration of intravenous contrast. Multiplanar CT image reconstructions and MIPs were obtained to evaluate the vascular anatomy. CONTRAST:  85mL OMNIPAQUE IOHEXOL 350 MG/ML SOLN COMPARISON:  Multiple previous exams most recently 03/25/2020 FINDINGS: CT HEAD Brain: Hemorrhagic infarction in the left parietal cortical and subcortical brain redemonstrated. Low-density is slightly increased, particularly along the anterior margin. Hemorrhagic foci are stable with no new or enlarging bleeds. Mild swelling with mass-effect and 1 or 2 mm of left-to-right shift and flattening of the left lateral ventricle. Vascular: See below Skull: Negative Sinuses/Orbits: Clear/normal CTA neck: Aortic arch is normal. Right common carotid artery is widely patent to the bifurcation. Normal carotid bifurcation. Normal cervical ICA. Left common carotid artery widely patent to the bifurcation. Carotid bifurcation is normal. Normal left ICA. Both vertebral arteries widely  patent at their origins and through the cervical region to the foramen magnum. CTA HEAD Anterior circulation: Both internal carotid arteries widely patent through the skull base and siphon regions. The anterior and middle cerebral vessels are normal without proximal stenosis, aneurysm or vascular malformation. Posterior circulation: Both vertebral arteries widely patent to the basilar. No basilar stenosis. Posterior circulation branch vessels are normal. Venous sinuses: See results of CT venography. Anatomic variants: None significant. CT venography of the head Anterior aspect of the superior sagittal sinus is patent. Again demonstrated is partial thrombosis of the superior sagittal sinus at the vertex. This is not occlusive as flow can be demonstrated around the margins of the thrombus. This is fairly segmental, involving a 5-6 cm segment of the sinus. Beyond that, the distal superior sagittal sinus is patent. Both transverse sinuses are patent. Compared to the initial presentation noncontrast CT and MRI, the thrombus does not appear progressive. Thrombosed superficial draining vein on the left again visible, similar in appearance. IMPRESSION: 1. No arterial pathology in the neck. 2. No intracranial large or medium vessel occlusion or correctable proximal stenosis. 3. Partial thrombosis of the superior sagittal sinus at the vertex, not occlusive as flow can be demonstrated around the margins of the thrombus. This is fairly segmental, involving a 5-6 cm segment of the sinus. Beyond that, the distal superior sagittal sinus is patent. Thrombosed superficial draining vein on the left again visible, similar in appearance. 4. Hemorrhagic infarction in the left parietal cortical and subcortical brain redemonstrated. Low-density edema is slightly increased, particularly along the anterior margin. No new or enlarging bleeds. Mild swelling with mass-effect and 1 or 2 mm of left-to-right shift and flattening of the left  lateral ventricle. Electronically Signed   By: Nelson Chimes M.D.   On: 03/28/2020 07:15   CT Head Wo Contrast  Result Date: 03/25/2020  CLINICAL DATA:  Initial evaluation for acute seizure. EXAM: CT HEAD WITHOUT CONTRAST TECHNIQUE: Contiguous axial images were obtained from the base of the skull through the vertex without intravenous contrast. COMPARISON:  Prior CT from earlier the same day. FINDINGS: Brain: Venous infarction with hemorrhagic conversion involving the left parieto-occipital region again seen, not significantly changed in size and morphology as compared to prior CT from earlier the same day. Surrounding vasogenic edema and regional mass effect is relatively similar. Associated left-to-right shift measures up to 4 mm, unchanged. Partial effacement of the left lateral ventricle without hydrocephalus or trapping, also similar. Basilar cisterns remain patent. No transtentorial herniation. No other acute intracranial hemorrhage or large vessel territory infarct. No visible extra-axial fluid collection. Vascular: No hyperdense vessel. Skull: Scalp soft tissues and calvarium remain within normal limits. Sinuses/Orbits: Globes and orbital soft tissues demonstrate no new finding.Paranasal sinuses and mastoid air cells remain clear. Other: None. IMPRESSION: 1. No significant interval change in size and morphology of left parieto-occipital lobe venous infarction with hemorrhagic conversion. Surrounding vasogenic edema and regional mass effect with 4 mm of left-to-right shift, unchanged. No hydrocephalus or trapping. 2. No other new acute intracranial abnormality. Electronically Signed   By: Jeannine Boga M.D.   On: 03/25/2020 22:51   CT HEAD WO CONTRAST  Result Date: 03/25/2020 CLINICAL DATA:  Headaches and neck stiffness.  Follow-up hemorrhage. EXAM: CT HEAD WITHOUT CONTRAST TECHNIQUE: Contiguous axial images were obtained from the base of the skull through the vertex without intravenous contrast.  COMPARISON:  Earlier same day FINDINGS: Brain: Venous infarction with hemorrhagic transformation in the left parietal region shows slightly more edema. The majority of the foci of internal hemorrhage are unchanged. There is a single new focus of hemorrhage at the vertex as marked by an arrow measuring 7 mm. The others appear stable. Mild swelling and mass effect no midline shift. No new area of infarction identified. No hydrocephalus. No evidence of subarachnoid extension. Vascular: Somewhat hyperdense superior sagittal sinus consistent with thrombosis shown by MRI. Skull: Negative Sinuses/Orbits: Clear/normal Other: None IMPRESSION: 1. Venous infarction with hemorrhagic transformation in the left parietal region shows slightly more edema. The majority of the foci of internal hemorrhage are unchanged. There is a single new focus of hemorrhage at the vertex as marked by an arrow measuring 7 mm. The others appear stable. 2. Somewhat hyperdense superior sagittal sinus consistent with thrombosis shown by MRI. Electronically Signed   By: Nelson Chimes M.D.   On: 03/25/2020 20:28   CT Head Wo Contrast  Result Date: 03/25/2020 CLINICAL DATA:  Three day history of headache and visual disturbance. EXAM: CT HEAD WITHOUT CONTRAST TECHNIQUE: Contiguous axial images were obtained from the base of the skull through the vertex without intravenous contrast. COMPARISON:  None. FINDINGS: Brain: There is a hemorrhagic process in the left posterior parietal/upper occipital region with moderate surrounding edema which appears to extend all the way to the cortex on the coronal images. This could be a hemorrhagic infarction, an AVM, a hypertensive process or less likely PRES syndrome. Recommend MRI brain without and with contrast for further evaluation. No significant mass effect or midline shift. No extra-axial fluid collections are identified. The right cerebral hemisphere appears normal. The brainstem and cerebellum are  unremarkable. Vascular: No vascular calcifications or hyperdense vessels. Skull: No skull fracture or bone lesions. Sinuses/Orbits: The paranasal sinuses and mastoid air cells are clear. The globes are intact. Other: No scalp lesions or scalp hematoma. IMPRESSION: 1. Hemorrhagic process in the  left posterior parietal region with moderate surrounding edema. This could be a hemorrhagic infarction, an AVM, a hypertensive process or less likely PRES syndrome. Recommend MRI brain without and with contrast for further evaluation. 2. No significant mass effect or midline shift. These results were called by telephone at the time of interpretation on 03/25/2020 at 1:16 pm to provider [facility] , who verbally acknowledged these results. Electronically Signed   By: Marijo Sanes M.D.   On: 03/25/2020 13:16   CT ANGIO NECK W OR WO CONTRAST  Result Date: 03/28/2020 CLINICAL DATA:  Follow-up venous sinus thrombosis with left parietal venous infarction. EXAM: CT HEAD WITHOUT CONTRAST CT arteriography and venography OF THE HEAD and neck TECHNIQUE: Contiguous axial images were obtained from the base of the skull through the vertex without intravenous contrast. Multidetector CT imaging of the head was performed using the standard protocol during bolus administration of intravenous contrast. Multiplanar CT image reconstructions and MIPs were obtained to evaluate the vascular anatomy. CONTRAST:  62mL OMNIPAQUE IOHEXOL 350 MG/ML SOLN COMPARISON:  Multiple previous exams most recently 03/25/2020 FINDINGS: CT HEAD Brain: Hemorrhagic infarction in the left parietal cortical and subcortical brain redemonstrated. Low-density is slightly increased, particularly along the anterior margin. Hemorrhagic foci are stable with no new or enlarging bleeds. Mild swelling with mass-effect and 1 or 2 mm of left-to-right shift and flattening of the left lateral ventricle. Vascular: See below Skull: Negative Sinuses/Orbits: Clear/normal CTA neck:  Aortic arch is normal. Right common carotid artery is widely patent to the bifurcation. Normal carotid bifurcation. Normal cervical ICA. Left common carotid artery widely patent to the bifurcation. Carotid bifurcation is normal. Normal left ICA. Both vertebral arteries widely patent at their origins and through the cervical region to the foramen magnum. CTA HEAD Anterior circulation: Both internal carotid arteries widely patent through the skull base and siphon regions. The anterior and middle cerebral vessels are normal without proximal stenosis, aneurysm or vascular malformation. Posterior circulation: Both vertebral arteries widely patent to the basilar. No basilar stenosis. Posterior circulation branch vessels are normal. Venous sinuses: See results of CT venography. Anatomic variants: None significant. CT venography of the head Anterior aspect of the superior sagittal sinus is patent. Again demonstrated is partial thrombosis of the superior sagittal sinus at the vertex. This is not occlusive as flow can be demonstrated around the margins of the thrombus. This is fairly segmental, involving a 5-6 cm segment of the sinus. Beyond that, the distal superior sagittal sinus is patent. Both transverse sinuses are patent. Compared to the initial presentation noncontrast CT and MRI, the thrombus does not appear progressive. Thrombosed superficial draining vein on the left again visible, similar in appearance. IMPRESSION: 1. No arterial pathology in the neck. 2. No intracranial large or medium vessel occlusion or correctable proximal stenosis. 3. Partial thrombosis of the superior sagittal sinus at the vertex, not occlusive as flow can be demonstrated around the margins of the thrombus. This is fairly segmental, involving a 5-6 cm segment of the sinus. Beyond that, the distal superior sagittal sinus is patent. Thrombosed superficial draining vein on the left again visible, similar in appearance. 4. Hemorrhagic infarction  in the left parietal cortical and subcortical brain redemonstrated. Low-density edema is slightly increased, particularly along the anterior margin. No new or enlarging bleeds. Mild swelling with mass-effect and 1 or 2 mm of left-to-right shift and flattening of the left lateral ventricle. Electronically Signed   By: Nelson Chimes M.D.   On: 03/28/2020 07:15   MR  Brain W and Wo Contrast  Result Date: 03/25/2020 CLINICAL DATA:  Brain mass or lesion. EXAM: MRI HEAD WITHOUT AND WITH CONTRAST TECHNIQUE: Multiplanar, multiecho pulse sequences of the brain and surrounding structures were obtained without and with intravenous contrast. CONTRAST:  44mL GADAVIST GADOBUTROL 1 MMOL/ML IV SOLN COMPARISON:  None. FINDINGS: Brain: Redemonstrated left occipital hemorrhage. Similar surrounding edema with local mass effect and mild rightward midline shift. No evidence of underlying mass lesion although acute blood products limit evaluation. There is a rounded area of arterial enhancement with in the hemorrhage centrally (see series 18, image 109) and superiorly within the hemorrhage (series 18, image 131) which may represent active/ongoing hemorrhage. On postcontrast imaging there is evidence of dural sinus thrombosis within the superior sagittal sinus posteriorly (for example see series 18, images 138 through 151) with thrombosis of a draining cortical vein in the region of hemorrhage (for example see series 18, images 123 through 136). No hydrocephalus. Major arterial flow voids are maintained at the skull base. Skull and upper cervical spine: Normal marrow signal. Sinuses/Orbits: Negative. Other: No mastoid effusions. IMPRESSION: 1. Thrombosis of the posterior aspect of the superior sagittal sinus with hemorrhagic transformation of a venous infarct in the left occipital lobe, as detailed above. There is thrombosis of a draining cortical vein in the region of hemorrhage, as detailed above. 2. There are two rounded areas of  enhancement within the left occipital hemorrhage which may represent active/ongoing hemorrhage. 3. When compared to same day CT, similar surrounding edema/mass effect with slight rightward midline shift. Critical Value/emergent results were called by telephone at the time of interpretation on 03/25/2020 at 2:38 pm to provider [facility] , who verbally acknowledged these results. Electronically Signed   By: Margaretha Sheffield MD   On: 03/25/2020 14:51   CT CHEST ABDOMEN PELVIS W CONTRAST  Result Date: 03/27/2020 CLINICAL DATA:  Left parietooccipital lobe venous infarct. EXAM: CT CHEST, ABDOMEN, AND PELVIS WITH CONTRAST TECHNIQUE: Multidetector CT imaging of the chest, abdomen and pelvis was performed following the standard protocol during bolus administration of intravenous contrast. CONTRAST:  112mL OMNIPAQUE IOHEXOL 300 MG/ML  SOLN COMPARISON:  None. FINDINGS: CT CHEST FINDINGS Cardiovascular: The heart size is normal. No substantial pericardial effusion. No thoracic aortic aneurysm. Mediastinum/Nodes: No mediastinal lymphadenopathy. There is no hilar lymphadenopathy. The esophagus has normal imaging features. There is no axillary lymphadenopathy. Lungs/Pleura: Dependent basilar atelectasis noted in both lower lobes. Somewhat confluent opacity in the left lower lobe on 77/7 is probably atelectatic although component of infectious/inflammatory airspace disease not entirely excluded. No pleural effusion. Musculoskeletal: No worrisome lytic or sclerotic osseous abnormality. CT ABDOMEN PELVIS FINDINGS Hepatobiliary: 11 mm hypervascular lesion noted inferior right liver (54/3). Geographic fatty deposition noted in the liver parenchyma with some sparing along the gallbladder fossa. There is no evidence for gallstones, gallbladder wall thickening, or pericholecystic fluid. No intrahepatic or extrahepatic biliary dilation. Pancreas: No focal mass lesion. No dilatation of the main duct. No intraparenchymal cyst. No  peripancreatic edema. Spleen: No splenomegaly. No focal mass lesion. Adrenals/Urinary Tract: No adrenal nodule or mass. Kidneys unremarkable. No evidence for hydroureter. The urinary bladder appears normal for the degree of distention. Stomach/Bowel: Stomach is unremarkable. No gastric wall thickening. No evidence of outlet obstruction. Duodenum is normally positioned as is the ligament of Treitz. No small bowel wall thickening. No small bowel dilatation. The terminal ileum is normal. The appendix is normal. No gross colonic mass. No colonic wall thickening. Vascular/Lymphatic: No abdominal aortic aneurysm. No abdominal aortic  atherosclerotic calcification. The portal vein, superior mesenteric vein and splenic vein are patent. Nonocclusive filling defects are identified in both left renal veins, visible on axial image 59/3 and confirmed on renal delay image 16/8. Both defects are well demonstrated on coronal imaging (see left renal vein on 69/5 and right renal vein on 74/5). Reproductive: Uterus appears heterogeneous with probable posterior left subserosal fibroid. Uterus measures 4.8 x 11.9 x 6.5 cm. There is no adnexal mass. Other: No intraperitoneal free fluid. Musculoskeletal: No worrisome lytic or sclerotic osseous abnormality. IMPRESSION: 1. Nonocclusive thrombus identified in both the left and right renal veins. 2. 11 mm hypervascular lesion noted inferior right liver. This is probably benign and may be a a flash filling hemangioma or vascular malformation. Abdominal MRI without and with contrast recommended to further evaluate. 3. Dependent basilar atelectasis in both lower lobes. Somewhat confluent opacity in the left lower lobe is probably atelectatic although component of infectious/inflammatory airspace disease not entirely excluded. 4. Hepatic steatosis. Electronically Signed   By: Misty Stanley M.D.   On: 03/27/2020 07:25   Chest Port 1 View  Result Date: 03/26/2020 CLINICAL DATA:  Aspiration  pneumonia EXAM: PORTABLE CHEST 1 VIEW COMPARISON:  None. FINDINGS: The heart size and mediastinal contours are within normal limits. Hazy airspace opacity seen at the left lung base. There is streaky airspace opacity seen within the right mid lung. The visualized skeletal structures are unremarkable. IMPRESSION: Hazy airspace opacity at the left lung base, likely atelectasis and/or infectious etiology. Electronically Signed   By: Prudencio Pair M.D.   On: 03/26/2020 00:43   EEG adult  Result Date: 03/26/2020 Lora Havens, MD     03/27/2020  8:43 AM Patient Name: Erica Rosario MRN: [id number] Epilepsy Attending: Lora Havens Referring Physician/Provider: Dr Amie [place] Duration: 03/25/2020 2318 to 0112, 03/26/2020 0836 to 03/27/2020 0636 Patient history: 43yo F with venous ischemic infarct with hemorrhagic transformation presented with seizure. EEG to evaluate for seizure. Level of alertness: awake, asleep AEDs during EEG study: LEV Technical aspects: This EEG study was done with scalp electrodes positioned according to the 10-20 International system of electrode placement. Electrical activity was acquired at a sampling rate of 500Hz  and reviewed with a high frequency filter of 70Hz  and a low frequency filter of 1Hz . EEG data were recorded continuously and digitally stored. Description: EEG showed posterior dominant rhythm of 9 Hz activity of moderate voltage (25-35 uV) seen predominantly in posterior head regions, symmetric and reactive to eye opening and eye closing. Sleep was characterized by vertex waves, sleep spindles (12-14hz ), maximal frontocentral region. EEG also showed continuous 3-6Hz  theta- delta slowing in left frontotemporal region. Spikes were also seen in bilateral parieto-occipital region. Hyperventilation and photic stimulation were not performed.   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 No seizures were seen during this study. Lora Havens   ECHOCARDIOGRAM COMPLETE  Result Date: 03/26/2020    ECHOCARDIOGRAM REPORT   Patient Name:   Erica Rosario Date of Exam: 03/26/2020 Medical Rec #:  [id number]       Height:       59.1 in Accession #:    [id number]      Weight:       140.0 lb Date of Birth:  1975-09-08      BSA:  1.586 m Patient Age:    58 years        BP:           113/59 mmHg Patient Gender: F               HR:           77 bpm. Exam Location:  Inpatient Procedure: 2D Echo Indications:    Stroke I163.9  History:        Patient has no prior history of Echocardiogram examinations.                 Intracerebral Hemorrhage.  Sonographer:    Mikki Santee RDCS (AE) Referring Phys: [id number] [place]  1. Left ventricular ejection fraction, by estimation, is 60 to 65%. The left ventricle has normal function. The left ventricle has no regional wall motion abnormalities. Left ventricular diastolic parameters were normal.  2. Right ventricular systolic function is normal. The right ventricular size is normal. There is normal pulmonary artery systolic pressure. The estimated right ventricular systolic pressure is 45.4 mmHg.  3. The mitral valve is normal in structure. No evidence of mitral valve regurgitation. No evidence of mitral stenosis.  4. The aortic valve is tricuspid. Aortic valve regurgitation is not visualized. No aortic stenosis is present.  5. The inferior vena cava is dilated in size with >50% respiratory variability, suggesting right atrial pressure of 8 mmHg. FINDINGS  Left Ventricle: Left ventricular ejection fraction, by estimation, is 60 to 65%. The left ventricle has normal function. The left ventricle has no regional wall motion abnormalities. The left ventricular internal cavity size  was normal in size. There is  no left ventricular hypertrophy. Left ventricular diastolic parameters were normal. Right Ventricle: The right ventricular size is normal. No increase in right ventricular wall thickness. Right ventricular systolic function is normal. There is normal pulmonary artery systolic pressure. The tricuspid regurgitant velocity is 2.01 m/s, and  with an assumed right atrial pressure of 8 mmHg, the estimated right ventricular systolic pressure is 09.8 mmHg. Left Atrium: Left atrial size was normal in size. Right Atrium: Right atrial size was normal in size. Pericardium: There is no evidence of pericardial effusion. Mitral Valve: The mitral valve is normal in structure. No evidence of mitral valve regurgitation. No evidence of mitral valve stenosis. Tricuspid Valve: The tricuspid valve is normal in structure. Tricuspid valve regurgitation is trivial. Aortic Valve: The aortic valve is tricuspid. Aortic valve regurgitation is not visualized. No aortic stenosis is present. Pulmonic Valve: The pulmonic valve was normal in structure. Pulmonic valve regurgitation is not visualized. Aorta: The aortic root is normal in size and structure. Venous: The inferior vena cava is dilated in size with greater than 50% respiratory variability, suggesting right atrial pressure of 8 mmHg. IAS/Shunts: No atrial level shunt detected by color flow Doppler.  LEFT VENTRICLE PLAX 2D LVIDd:         4.50 cm  Diastology LVIDs:         3.10 cm  LV e' medial:    12.20 cm/s LV PW:         0.90 cm  LV E/e' medial:  6.3 LV IVS:        0.70 cm  LV e' lateral:   8.59 cm/s LVOT diam:     1.90 cm  LV E/e' lateral: 8.9 LV SV:         65 LV SV Index:   41 LVOT Area:     2.84  cm  RIGHT VENTRICLE RV S prime:     13.40 cm/s TAPSE (M-mode): 1.7 cm LEFT ATRIUM             Index       RIGHT ATRIUM           Index LA diam:        3.40 cm 2.14 cm/m  RA Area:     11.80 cm LA Vol (A2C):   49.1 ml 30.96 ml/m RA Volume:   23.10 ml  14.57 ml/m  LA Vol (A4C):   32.1 ml 20.24 ml/m LA Biplane Vol: 41.8 ml 26.36 ml/m  AORTIC VALVE LVOT Vmax:   123.00 cm/s LVOT Vmean:  86.500 cm/s LVOT VTI:    0.230 m  AORTA Ao Root diam: 2.70 cm MITRAL VALVE               TRICUSPID VALVE MV Area (PHT): 2.34 cm    TR Peak grad:   16.2 mmHg MV Decel Time: 324 msec    TR Vmax:        201.00 cm/s MV E velocity: 76.50 cm/s MV A velocity: 44.10 cm/s  SHUNTS MV E/A ratio:  1.73        Systemic VTI:  0.23 m                            Systemic Diam: 1.90 cm Erica Champagne MD Electronically signed by Erica Champagne MD Signature Date/Time: 03/26/2020/5:01:38 PM    Final    CT VENOGRAM HEAD  Result Date: 03/28/2020 CLINICAL DATA:  Follow-up venous sinus thrombosis with left parietal venous infarction. EXAM: CT HEAD WITHOUT CONTRAST CT arteriography and venography OF THE HEAD and neck TECHNIQUE: Contiguous axial images were obtained from the base of the skull through the vertex without intravenous contrast. Multidetector CT imaging of the head was performed using the standard protocol during bolus administration of intravenous contrast. Multiplanar CT image reconstructions and MIPs were obtained to evaluate the vascular anatomy. CONTRAST:  28mL OMNIPAQUE IOHEXOL 350 MG/ML SOLN COMPARISON:  Multiple previous exams most recently 03/25/2020 FINDINGS: CT HEAD Brain: Hemorrhagic infarction in the left parietal cortical and subcortical brain redemonstrated. Low-density is slightly increased, particularly along the anterior margin. Hemorrhagic foci are stable with no new or enlarging bleeds. Mild swelling with mass-effect and 1 or 2 mm of left-to-right shift and flattening of the left lateral ventricle. Vascular: See below Skull: Negative Sinuses/Orbits: Clear/normal CTA neck: Aortic arch is normal. Right common carotid artery is widely patent to the bifurcation. Normal carotid bifurcation. Normal cervical ICA. Left common carotid artery widely patent to the bifurcation. Carotid bifurcation  is normal. Normal left ICA. Both vertebral arteries widely patent at their origins and through the cervical region to the foramen magnum. CTA HEAD Anterior circulation: Both internal carotid arteries widely patent through the skull base and siphon regions. The anterior and middle cerebral vessels are normal without proximal stenosis, aneurysm or vascular malformation. Posterior circulation: Both vertebral arteries widely patent to the basilar. No basilar stenosis. Posterior circulation branch vessels are normal. Venous sinuses: See results of CT venography. Anatomic variants: None significant. CT venography of the head Anterior aspect of the superior sagittal sinus is patent. Again demonstrated is partial thrombosis of the superior sagittal sinus at the vertex. This is not occlusive as flow can be demonstrated around the margins of the thrombus. This is fairly segmental, involving a 5-6 cm segment of the sinus. Beyond that,  the distal superior sagittal sinus is patent. Both transverse sinuses are patent. Compared to the initial presentation noncontrast CT and MRI, the thrombus does not appear progressive. Thrombosed superficial draining vein on the left again visible, similar in appearance. IMPRESSION: 1. No arterial pathology in the neck. 2. No intracranial large or medium vessel occlusion or correctable proximal stenosis. 3. Partial thrombosis of the superior sagittal sinus at the vertex, not occlusive as flow can be demonstrated around the margins of the thrombus. This is fairly segmental, involving a 5-6 cm segment of the sinus. Beyond that, the distal superior sagittal sinus is patent. Thrombosed superficial draining vein on the left again visible, similar in appearance. 4. Hemorrhagic infarction in the left parietal cortical and subcortical brain redemonstrated. Low-density edema is slightly increased, particularly along the anterior margin. No new or enlarging bleeds. Mild swelling with mass-effect and 1 or 2  mm of left-to-right shift and flattening of the left lateral ventricle. Electronically Signed   By: Nelson Chimes M.D.   On: 03/28/2020 07:15    Labs:  Basic Metabolic Panel: Recent Labs  Lab 03/28/20 0116 03/29/20 2778 03/30/20 0046 03/31/20 0323 04/01/20 0651 04/02/20 0558  NA 139 139 139 140 139 138  K 3.3* 3.6 4.0 3.7 3.8 3.9  CL 110 109 109 109 106 104  CO2 19* 20* 19* 22 22 23   GLUCOSE 86 90 91 84 85 94  BUN <5* <5* <5* <5* 6 9  CREATININE 0.62 0.67 0.67 0.70 0.70 0.68  CALCIUM 7.9* 8.5* 8.7* 8.9 9.0 8.9    CBC: Recent Labs  Lab 03/31/20 0323 04/01/20 0651 04/02/20 0558  WBC 6.3 6.6 6.8  NEUTROABS  --  3.4  --   HGB 8.3* 8.7* 8.8*  HCT 27.9* 29.4* 28.7*  MCV 74.2* 73.0* 74.0*  PLT 377 429* 427*    CBG: No results for input(s): GLUCAP in the last 168 hours.  Brief HPI:   KATIANA RULAND is a 44 y.o. right-handed female with unremarkable past medical history on no prescription medications.  Lives with spouse independent prior to admission.  Presented 03/25/2020 with headache stiffness of neck as well as blurred vision.  Patient had been vaccinated COVID-06 September 2019.  There was reported seizure in the ED.  CT MRI showed thrombosis of the posterior aspect of the superior sagittal sinus with hemorrhagic transformation of a venous infarct in the left occipital lobe.  There was a thrombosis of draining cortical vein in the region of hemorrhage 2 rounded areas of enhancement within the left occipital hemorrhage possibly representing active ongoing hemorrhage.  CT angiogram of head and neck no arterial pathology of the neck.  No intracranial large vessel occlusion.  Admission chemistries potassium 3.2 hemoglobin 9.2 EEG negative.  Echocardiogram with ejection fraction of 65% no wall motion abnormalities.  Neurology follow-up placed on Keppra for seizure prophylaxis.  Intravenous heparin transition to Coumadin.  Therapy evaluations completed patient was admitted for a  comprehensive rehab program   Hospital Course: KADIA ABAYA was admitted to rehab 03/31/2020 for inpatient therapies to consist of PT, ST and OT at least three hours five days a week. Past admission physiatrist, therapy team and rehab RN have worked together to provide customized collaborative inpatient rehab.  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  No bleeding episodes.  Pain managed with use of Fioricet for headaches.  She continued on Keppra  for seizure prophylaxis.  Lipitor for hyperlipidemia.  Multifactorial anemia 8.7 again no bleeding episodes.  She did have some mild increase in LFTs and monitored would need follow-up outpatient.   Blood pressures were monitored on TID basis and controlled      Rehab course: During patient's stay in rehab weekly team conferences were held to monitor patient's progress, set goals and discuss barriers to discharge. At admission, patient required minimal assist 220 feet minimal guard sit to stand.  Supervision upper body bathing minimal assist lower body bathing supervision upper body dressing  Physical exam.  Blood pressure 118/70 pulse 80 temperature 99 respirations 18 oxygen saturations 92% room air Constitutional.  No acute distress HEENT Head.  Normocephalic and atraumatic Neck.  Supple nontender no JVD without thyromegaly Cardiac regular rate rhythm without any extra sounds or murmur heard Abdomen.  Soft nontender positive bowel sounds without rebound Respiratory effort normal no respiratory distress without wheeze Extremities.  No clubbing cyanosis or edema Skin.  Warm and dry Neurologic.  Alert no acute distress oriented x3 strength 5/5 throughout  /She  has had improvement in activity tolerance, balance, postural control as well as ability to compensate for deficits. Marlana Salvage has had improvement  in functional use RUE/LUE  and RLE/LLE as well as improvement in awareness.  Patient participated with all ADLs supervision needed ambulates with standby assist.  Gather his belongings as needed for ADLs and was stressed the need for safety.  She did display some mild cognitive impairment with follow-up her speech therapy short-term memory again it was discussed with patient and family need for supervision for safety.  Family teaching completed plan discharged home       Disposition: Discharged to home    Diet: Regular  Special Instructions: No driving smoking or alcohol  Patient would follow-up in 3 to 5 days with [facility] for ongoing management of Coumadin  Medications at discharge 1 Tylenol as needed 2.  Lipitor 40 mg daily 3.  Fioricet 1 tablet every 8 hours as needed 4.  Keppra 500 mg twice daily 5.  Protonix 40 mg daily 6.  Coumadin latest dose of 4 mg adjusted accordingly for INR 2.0-3.0 7.Topamax 25 mg BID  30-35 minutes were spent completing discharge summary and discharge planning  Discharge Instructions     Ambulatory referral to Neurology   Complete by: As directed    An appointment is requested in approximately 4 weeks superior sagittal sinus thrombosis/ICH   Ambulatory referral to Physical Medicine Rehab   Complete by: As directed    Moderate complexity follow-up 1 to 2 weeks superior sagittal sinus thrombosis        Follow-up Information     Jamse Arn, MD Follow up.   Specialty: Physical Medicine and Rehabilitation Why: Office to call for appointment Contact information: [place] [address] [address], [place] Follow up on 04/09/2020.   Specialty: General Practice Why: Appointment @ 3:00 PM Contact information: [place] [address] [phone]                 Signed: Lavon Paganini Rosario 04/03/2020, 5:21 AM Patient was seen, face-face, and physical exam performed by me on day of discharge, greater than 30 minutes of total time spent.. Please see progress note from day of discharge as well.  Delice Lesch, MD, ABPMR

## 2020-04-02 NOTE — Discharge Summary (Signed)
Physical Therapy Discharge Summary  Patient Details  Name: Erica Rosario MRN: [id number]  Date of Birth: Jan 03, 1976  Today's Date: 04/02/2020 PT Individual Time: 1330-1445 PT Individual Time Calculation (min): 75 min    Patient has met 10 of 10 long term goals due to improved activity tolerance, improved balance, ability to compensate for deficits, improved attention, improved awareness and improved coordination. Patient to discharge at an ambulatory level Supervision.   Patient's care partner is independent to provide the necessary cognitive assistance at discharge.  Reasons goals not met: n/a  Recommendation:  No follow up physical therapy needed, pt is near baseline mobility and is primarily limited by R visual field cut and short term memory deficits.  Equipment: No equipment provided  Reasons for discharge: treatment goals met and discharge from hospital  Patient/family agrees with progress made and goals achieved: Yes  PT Discharge Precautions/Restrictions Precautions Precautions: Fall Precaution Comments: right visual field cut; spanish interpreter needed Restrictions Weight Bearing Restrictions: No Vital Signs Therapy Vitals Temp: 98.3 F (36.8 C) Pulse Rate: 75 Resp: 15 BP: (!) 104/54 Patient Position (if appropriate): Sitting Oxygen Therapy SpO2: 99 % O2 Device: Room Air Pain Pain Assessment Pain Scale: 0-10 Pain Score: 0-No pain Vision/Perception  Vision - Assessment Eye Alignment: Within Functional Limits Ocular Range of Motion: Within Functional Limits Alignment/Gaze Preference: Within Defined Limits Tracking/Visual Pursuits: Able to track stimulus in all quads without difficulty Perception Perception: Within Functional Limits Praxis Praxis: Intact  Cognition Overall Cognitive Status: Impaired/Different from baseline Arousal/Alertness: Awake/alert Orientation Level: Oriented X4 Attention: Selective;Sustained Focused Attention: Appears  intact Sustained Attention: Appears intact Memory: Impaired Memory Impairment: Decreased short term memory;Storage deficit;Retrieval deficit Decreased Short Term Memory: Functional basic Safety/Judgment: Appears intact Sensation Sensation Light Touch: Appears Intact Proprioception: Appears Intact Coordination Gross Motor Movements are Fluid and Coordinated: No Coordination and Movement Description: Mild delay, likely contributed to R visual field cut Finger Nose Finger Test: [facility] Heel Shin Test: [facility] Motor  Motor Motor: Hemiplegia Motor - Skilled Clinical Observations: Very mild R sided weakness  Mobility Bed Mobility Bed Mobility: Rolling Right;Supine to Sit;Rolling Left;Sit to Supine Rolling Right: Independent Rolling Left: Independent Supine to Sit: Independent Sit to Supine: Independent Transfers Transfers: Sit to Stand;Stand to Sit;Stand Pivot Transfers Sit to Stand: Independent Stand to Sit: Independent Stand Pivot Transfers: Supervision/Verbal cueing Stand Pivot Transfer Details: Verbal cues for precautions/safety Transfer (Assistive device): None Locomotion  Gait Ambulation: Yes Gait Assistance: Supervision/Verbal cueing Gait Distance (Feet): 200 Feet Assistive device: None Gait Assistance Details: Verbal cues for precautions/safety Gait Assistance Details: VC for visual scanning in upper AND lower quadrant in R visual field, especially with turns Gait Gait: Yes Gait Pattern: Impaired Gait Pattern: Narrow base of support;Decreased stride length;Decreased step length - left;Decreased step length - right Gait velocity: reduced Stairs / Additional Locomotion Stairs: Yes Stairs Assistance: Supervision/Verbal cueing Stair Management Technique: Two rails Number of Stairs: 12 Height of Stairs: 6 Ramp: Supervision/Verbal cueing Curb: Supervision/Verbal cueing Wheelchair Mobility Wheelchair Mobility: No  Trunk/Postural Assessment  Cervical Assessment Cervical  Assessment: Within Functional Limits Thoracic Assessment Thoracic Assessment: Within Functional Limits Lumbar Assessment Lumbar Assessment: Within Functional Limits Postural Control Postural Control: Within Functional Limits  Balance Balance Balance Assessed: Yes Static Sitting Balance Static Sitting - Balance Support: Feet unsupported Static Sitting - Level of Assistance: 7: Independent Dynamic Sitting Balance Dynamic Sitting - Level of Assistance: 7: Independent Static Standing Balance Static Standing - Balance Support: During functional activity Static Standing - Level of Assistance: 5: Stand  by assistance Dynamic Standing Balance Dynamic Standing - Balance Support: During functional activity Dynamic Standing - Level of Assistance: 5: Stand by assistance Extremity Assessment  RUE Assessment RUE Assessment: Exceptions to [facility] General Strength Comments: Grossly 4+/5 LUE Assessment LUE Assessment: Within Functional Limits General Strength Comments: Grossly 5/5 RLE Assessment RLE Assessment: Within Functional Limits General Strength Comments: Grossly 5/5 LLE Assessment LLE Assessment: Within Functional Limits General Strength Comments: Grossly 5/5  Skilled intervention: Pt received sitting in recliner, agreeable to PT session. In person interpreter used during session as pt is spanish speaking. She denies any pain. Donned socks and Sit<>stand indep from recliner with no AD. She ambulated >255f with supervision in hallways from her room to ortho gym. Gait deficits include decreased gait speed, narrow BOS, R cervical rotation to compensate for visual deficits (R visual field cut). She ambulated up/down 188framp with supervision, similar gait deficits. She also ambulated on unlevel mulch and up/down curb with supervision, cues for safety and visual scanning. She negotiated up/down x12 steps with supervision using B HR support, no knee buckling or LOB. She then ambulated to ADL  apartment room with supervision, performed furniture transfer indep. She was instructed to locate 3 objects in ADL room: jello, measuring cup, and spoon. She was able to recall 2/3 items and located 2/3 items but required ++ time and min cues for locating. Due to memory deficits, educated her on strategies such as writing down lists and using a timer while cooking for stove on/off. Then performed Dynavision x2 trials, one standing on level ground and the 2nd trial standing on blue foam airex pad. Dynavision performed for purposes of R visual field and locating red lights. 1st trial she had reaction time of 6.37 in RLQ, 3.48 in LLQ. 2nd trial reaction time of 3.25 in RLQ, 2.75 in LLQ. Ambulated back to her room with supervision. Instructed her on general home safety training, avoiding crowded/busy areas while walking, and simple neck stretches to avoid neck pain 2/2 her frequent R cervical rotation for visual deficits. Pt ended session seated in recliner, all questions/concerns addressed, pt is eager and excited about upcoming DC.  Fabianna Keats P Mckaylin Bastien PT, DPT 04/02/2020, 3:43 PM

## 2020-04-02 NOTE — Plan of Care (Signed)
  Problem: RH SAFETY Goal: RH STG ADHERE TO SAFETY PRECAUTIONS W/ASSISTANCE/DEVICE Description: STG Adhere to Safety Precautions With Mod I Assistance/Device. Outcome: Progressing Goal: RH STG DECREASED RISK OF FALL WITH ASSISTANCE Description: STG Decreased Risk of Fall With Mod I Assistance. Outcome: Progressing

## 2020-04-02 NOTE — Progress Notes (Signed)
Pt c/o 10/10 headache w/ Nausea and Dizziness. Spoke to [place], [place], verbal order given for 1x dose of Topamax 25 mg, and Discontinue the scheduled for 2200. Pt made aware and agreeable.

## 2020-04-02 NOTE — Progress Notes (Addendum)
Patient ID: Erica Rosario, female   DOB: 1975-10-03, 44 y.o.   MRN: [id number]  Team feels pt will be ready for discharge tomorrow. MD is in agreement with this plan. Pt will have family at home to be there with her. Working on follow up and Building services engineer for medications  3:00 PM Gave pt and husband with interpreter present a Medicaid application along with information to assist with the hospital bill. Both feel ready to go home tomorrow.

## 2020-04-02 NOTE — Progress Notes (Signed)
ANTICOAGULATION CONSULT NOTE - Follow Up Consult  Pharmacy Consult for Coumadin Indication: Dural venous sinus thrombosis  No Known Allergies  Patient Measurements: Height: 5' (152.4 cm) (per pt) Weight: 66.2 kg (145 lb 15.1 oz) IBW/kg (Calculated) : 45.5 Heparin Dosing Weight: 60 kg  Vital Signs: Temp: 98.2 F (36.8 C) (10/14 0427) BP: 110/66 (10/14 0427) Pulse Rate: 79 (10/14 0427)  Labs: Recent Labs    03/31/20 0323 03/31/20 0323 03/31/20 0933 04/01/20 0651 04/02/20 0558  HGB 8.3*   < >  --  8.7* 8.8*  HCT 27.9*  --   --  29.4* 28.7*  PLT 377  --   --  429* 427*  LABPROT 20.3*  --   --  22.4* 24.7*  INR 1.8*  --   --  2.0* 2.3*  HEPARINUNFRC 0.50   < > 0.37 0.25* 0.67  CREATININE 0.70  --   --  0.70 0.68   < > = values in this interval not displayed.    Estimated Creatinine Clearance: 75.4 mL/min (by C-G formula based on SCr of 0.68 mg/dL).   Assessment:  Anticoag: Heparin to warfarin for venous sinus thrombosis with hemorrhagic transformation + Bilateral renal vein nonocclusive thrombosis.  INR therapeutic at 2.3. No bleeding noted, Hgb stable 8s, platelets are normal.  Goal of Therapy:  INR 2-3 Monitor platelets by anticoagulation protocol: Yes   Plan:  Discontinue heparin drip  Coumadin 4 mg PO tonight Daily INR Monitor for s/sx of bleeding Coumadin education complete   Thank you for involving pharmacy in this patient's care.  Renold Genta, PharmD, BCPS Clinical Pharmacist Clinical phone for 04/02/2020 until 3p is [phone] 04/02/2020 8:48 AM  **Pharmacist phone directory can be found on [email or web address] listed under [place]**

## 2020-04-02 NOTE — Progress Notes (Signed)
Speech Language Pathology Discharge Summary  Patient Details  Name: Erica FERG MRN: [id number] Date of Birth: 06/04/1976  Today's Date: 04/02/2020 SLP Individual Time: 0800-0900 SLP Individual Time Calculation (min): 60 min   Skilled Therapeutic Interventions:  Patient seen with Spanish interpreter present for cognitive-linguistic therapy and patient education. Patient was oriented to Month, date, year and day of week but did have delay in responding to these questions. SLP provided stroke education to patient and we discussed stroke awareness and prevention. She is compensating fairly well for her loss of right peripheral visual impairment and able to ambulate with SLP from room and back to SLP office down hall with supervision only. She was receptive to education and SLP provided printouts in Spanish that describe the type of stroke she had, warning signs and recommendations for lifestyle changes to decrease future stroke risk.     Patient has met 2 of 2 long term goals.  Patient to discharge at overall Supervision level.  Reasons goals not met: N/A   Clinical Impression/Discharge Summary: Patient met 2/2 LTG's which were for memory and problem solving at complex level with supervision. CIR stay was brief secondary to patient at high level functioning at evaluation as well as patient and family concerns regarding financial resources as patient is uninsured. Patient is being discharged to home with family providing supervision. She is demonstrating adequate problem solving, safety awareness.  Care Partner:  Caregiver Able to Provide Assistance: Yes  Type of Caregiver Assistance: Physical;Cognitive  Recommendation:  None;24 hour supervision/assistance  Rationale for SLP Follow Up: Other (comment)   Equipment: N/A   Reasons for discharge: Discharged from hospital   Patient/Family Agrees with Progress Made and Goals Achieved: Yes    Sonia Baller, MA, CCC-SLP Speech  Therapy

## 2020-04-02 NOTE — Discharge Instructions (Signed)
Inpatient Rehab Discharge Instructions  Erica Rosario Discharge date and time: No discharge date for patient encounter.   Activities/Precautions/ Functional Status: Activity: activity as tolerated Diet: regular diet Wound Care: Routine skin checks Functional status:  ___ No restrictions     ___ Walk up steps independently ___ 24/7 supervision/assistance   ___ Walk up steps with assistance ___ Intermittent supervision/assistance  ___ Bathe/dress independently ___ Walk with walker     _x__ Bathe/dress with assistance ___ Walk Independently    ___ Shower independently ___ Walk with assistance    ___ Shower with assistance ___ No alcohol     ___ Return to work/school ________  Special Instructions: No driving smoking or alcohol  Follow-up Monday, 04/06/2020 with [facility] in [facility] for check of INR.  INR goal 2.00-3.00 phone number [phone] fax [phone]   COMMUNITY REFERRALS UPON DISCHARGE:   HOME EXERCISE PROGRAM GIVEN TO PATIENT AND HUSBAND  Medical Equipment/Items Ordered:NO NEEDS                                                  MATCH GIVEN FOR PRESCRIPTION ASSISTANCE  My questions have been answered and I understand these instructions. I will adhere to these goals and the provided educational materials after my discharge from the hospital.  Patient/Caregiver Signature _______________________________ Date __________  Clinician Signature _______________________________________ Date __________  Please bring this form and your medication list with you to all your follow-up doctor's appointments.

## 2020-04-03 ENCOUNTER — Other Ambulatory Visit (HOSPITAL_COMMUNITY): Payer: Self-pay | Admitting: Physician Assistant

## 2020-04-03 LAB — PROTIME-INR
INR: 2.1 — ABNORMAL HIGH (ref 0.8–1.2)
Prothrombin Time: 22.5 seconds — ABNORMAL HIGH (ref 11.4–15.2)

## 2020-04-03 MED ORDER — TOPIRAMATE 25 MG PO TABS: 25.0000 mg | ORAL_TABLET | Freq: Two times a day (BID) | ORAL | 0 refills | Status: DC

## 2020-04-03 MED ORDER — PANTOPRAZOLE SODIUM 40 MG PO TBEC
40.0000 mg | DELAYED_RELEASE_TABLET | Freq: Every day | ORAL | 0 refills | Status: DC
Start: 2020-04-03 — End: 2021-12-22

## 2020-04-03 MED ORDER — TOPIRAMATE 25 MG PO TABS
25.0000 mg | ORAL_TABLET | Freq: Two times a day (BID) | ORAL | 0 refills | Status: DC
Start: 2020-04-03 — End: 2020-04-11

## 2020-04-03 MED ORDER — TOPIRAMATE 25 MG PO TABS
25.0000 mg | ORAL_TABLET | Freq: Two times a day (BID) | ORAL | 0 refills | Status: DC
Start: 2020-04-03 — End: 2020-04-03

## 2020-04-03 MED ORDER — WARFARIN SODIUM 4 MG PO TABS: 4.0000 mg | ORAL_TABLET | Freq: Every day | ORAL | Status: DC

## 2020-04-03 MED ORDER — TOPIRAMATE 25 MG PO TABS
25.0000 mg | ORAL_TABLET | Freq: Two times a day (BID) | ORAL | Status: DC
  Administered 2020-04-03: 25 mg via ORAL
  Filled 2020-04-03: qty 1

## 2020-04-03 MED ORDER — TOPIRAMATE 25 MG PO TABS: 25.0000 mg | ORAL_TABLET | Freq: Two times a day (BID) | ORAL | Status: DC

## 2020-04-03 MED ORDER — WARFARIN SODIUM 4 MG PO TABS: 4.0000 mg | ORAL_TABLET | Freq: Once | ORAL | 0 refills | Status: DC

## 2020-04-03 MED FILL — TOPIRAMATE 25 MG TABLET: 25 | 30 days supply | Qty: 60 | Fill #0

## 2020-04-03 NOTE — Progress Notes (Signed)
ANTICOAGULATION CONSULT NOTE - Follow Up Consult  Pharmacy Consult for Coumadin Indication: Dural venous sinus thrombosis  No Known Allergies  Patient Measurements: Height: 5' (152.4 cm) (per pt) Weight: 66.2 kg (145 lb 15.1 oz) IBW/kg (Calculated) : 45.5 Heparin Dosing Weight: 60 kg  Vital Signs: Temp: 98 F (36.7 C) (10/15 0510) BP: 107/55 (10/15 0510) Pulse Rate: 76 (10/15 0510)  Labs: Recent Labs    03/31/20 0933 04/01/20 0651 04/02/20 0558 04/03/20 0457  HGB  --  8.7* 8.8*  --   HCT  --  29.4* 28.7*  --   PLT  --  429* 427*  --   LABPROT  --  22.4* 24.7* 22.5*  INR  --  2.0* 2.3* 2.1*  HEPARINUNFRC 0.37 0.25* 0.67  --   CREATININE  --  0.70 0.68  --     Estimated Creatinine Clearance: 75.4 mL/min (by C-G formula based on SCr of 0.68 mg/dL).   Assessment:  Anticoag: Heparin to warfarin for venous sinus thrombosis with hemorrhagic transformation + Bilateral renal vein nonocclusive thrombosis.  INR therapeutic at 2.1. No bleeding noted, Hgb stable 8s, platelets are normal.  Goal of Therapy:  INR 2-3 Monitor platelets by anticoagulation protocol: Yes   Plan:  Coumadin 4 mg PO qpm Daily INR Monitor for s/sx of bleeding Coumadin education complete   Thank you for involving pharmacy in this patient's care.  Renold Genta, PharmD, BCPS Clinical Pharmacist Clinical phone for 04/03/2020 until 3p is [phone] 04/03/2020 9:09 AM  **Pharmacist phone directory can be found on [email or web address] listed under [place]**

## 2020-04-03 NOTE — Progress Notes (Signed)
PHYSICAL MEDICINE & REHABILITATION PROGRESS NOTE  Subjective/Complaints: Patient seen laying in bed this morning.  Family at bedside.  She states she slept fairly well overnight due to headache.  She states she has a headache this morning and believes it is because it takes her too long to receive her medications.  She still would like to d/c home today.  ROS: Denies CP, SOB, N/V/D  Objective: Vital Signs: Blood pressure (!) 107/55, pulse 76, temperature 98 F (36.7 C), resp. rate 15, height 5' (1.524 m), weight 66.2 kg, SpO2 97 %. No results found. Recent Labs    04/01/20 0651 04/02/20 0558  WBC 6.6 6.8  HGB 8.7* 8.8*  HCT 29.4* 28.7*  PLT 429* 427*   Recent Labs    04/01/20 0651 04/02/20 0558  NA 139 138  K 3.8 3.9  CL 106 104  CO2 22 23  GLUCOSE 85 94  BUN 6 9  CREATININE 0.70 0.68  CALCIUM 9.0 8.9    Intake/Output Summary (Last 24 hours) at 04/03/2020 1056 Last data filed at 04/03/2020 0700 Gross per 24 hour  Intake 750 ml  Output --  Net 750 ml        Physical Exam: BP (!) 107/55 (BP Location: Left Arm)   Pulse 76   Temp 98 F (36.7 C)   Resp 15   Ht 5' (1.524 m) Comment: per pt  Wt 66.2 kg   SpO2 97%   BMI 28.50 kg/m  Constitutional: No distress . Vital signs reviewed. HENT: Normocephalic.  Atraumatic. Eyes: EOMI. No discharge. Cardiovascular: No JVD.  RRR. Respiratory: Normal effort.  No stridor.  Bilateral clear to auscultation. GI: Non-distended.  BS +. Skin: Warm and dry.  Intact. Psych: Normal mood.  Normal behavior. Musc: No edema in extremities.  No tenderness in extremities. Neuro: Alert Right visual field cut Motor: LUE/LE: 5/5 proximal distal LUE: 4+-5/5 proximal distal with apraxia Dysmetria, stable Left lower extremity: 5/5 proximal to distal  Assessment/Plan: 1. 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  Physiatrist is providing close team supervision and 24 hour management of active medical problems listed below.  Physiatrist and rehab team continue to assess barriers to discharge/monitor patient progress toward functional and medical goals   Care Tool:  Bathing    Body parts bathed by patient: Right arm, Left arm, Chest, Abdomen, Front perineal area, Buttocks, Right upper leg, Left upper leg, Right lower leg, Left lower leg, Face         Bathing assist Assist Level: Supervision/Verbal cueing     Upper Body Dressing/Undressing Upper body dressing   What is the patient wearing?: Pull over shirt, Bra    Upper body assist Assist Level: Independent    Lower Body Dressing/Undressing Lower body dressing      What is the patient wearing?: Underwear/pull up, Pants     Lower body assist Assist for lower body dressing: Independent     Toileting Toileting    Toileting assist Assist for toileting: Independent     Transfers Chair/bed transfer  Transfers assist     Chair/bed transfer assist level: Supervision/Verbal cueing     Locomotion Ambulation   Ambulation assist      Assist level: Supervision/Verbal cueing Assistive device: No Device Max distance: 215ft   Walk 10 feet activity   Assist     Assist level: Supervision/Verbal  cueing Assistive device: No Device   Walk 50 feet activity   Assist    Assist level: Supervision/Verbal cueing Assistive device: No Device    Walk 150 feet activity   Assist    Assist level: Supervision/Verbal cueing Assistive device: No Device    Walk 10 feet on uneven surface  activity   Assist     Assist level: Supervision/Verbal cueing     Wheelchair     Assist Will patient use wheelchair at discharge?: No             Wheelchair 50 feet with 2 turns activity    Assist            Wheelchair 150 feet activity      Assist           Medical Problem List and Plan: 1.  Decreased functional mobility with headache secondary to superior sagittal sinus thrombosis with resultant venous infarct and hemorrhagic conversion  DC today  Will see patient for transitional care management in 1-2 weeks post-discharge  Patient uninsured and concerned about cost, will plan to discharge tomorrow 2.  Antithrombotics: -DVT/anticoagulation: Heparin GGT transitioned to Coumadin, discussed with pharmacy  INR therapeutic on 10/15             -antiplatelet therapy: N/A 3. Pain Management/vascular headache: Fioricet as needed, plan to wean  Topamax 25 nightly started on 10/14, increased to BID on 10/15 4. Mood: Provide emotional support             -antipsychotic agents: N/A 5. Neuropsych: This patient is capable of making decisions on her own behalf. 6. Skin/Wound Care: Routine skin checks 7. Fluids/Electrolytes/Nutrition: Routine in and outs. 8.  Seizure prophylaxis.    Keppra 500 mg twice daily.  9.  Hyperlipidemia.    Lipitor 10. Disposition: Lives with husband and two sons aged 35 and 28. Husband can take time off work if needed to care for her.  11.  Diarrhea  Improved 12.  Anemia-multifactorial  Hemoglobin 8.8 on 10/14 13.  Transaminitis  Remains elevated, but improving on 10/14 14.  Nausea  As needed Zofran  Improved  > 30 minutes spent in total in discharge planning between myself and PA regarding aforementioned, as well discussion regarding follow-up appointments, follow-up therapies, discharge medications, discharge recommendations, particularly given lack of insurance and financial constraints.  LOS: 3 days A FACE TO FACE EVALUATION WAS PERFORMED  Abagale Boulos [name] 04/03/2020, 10:56 AM

## 2020-04-04 ENCOUNTER — Other Ambulatory Visit: Payer: Self-pay

## 2020-04-04 ENCOUNTER — Encounter (HOSPITAL_COMMUNITY): Payer: Self-pay | Admitting: Emergency Medicine

## 2020-04-04 ENCOUNTER — Emergency Department (HOSPITAL_COMMUNITY): Payer: Medicaid Other

## 2020-04-04 ENCOUNTER — Inpatient Hospital Stay (HOSPITAL_COMMUNITY)
Admission: EM | Admit: 2020-04-04 | Discharge: 2020-04-11 | DRG: 064 | Disposition: A | Discharge status: Home / Self Care | Payer: Medicaid Other | Attending: Neurology | Admitting: Neurology

## 2020-04-04 DIAGNOSIS — E876 Hypokalemia: Secondary | ICD-10-CM | POA: Diagnosis not present

## 2020-04-04 DIAGNOSIS — E785 Hyperlipidemia, unspecified: Secondary | ICD-10-CM | POA: Diagnosis present

## 2020-04-04 DIAGNOSIS — I611 Nontraumatic intracerebral hemorrhage in hemisphere, cortical: Secondary | ICD-10-CM | POA: Diagnosis present

## 2020-04-04 DIAGNOSIS — R569 Unspecified convulsions: Secondary | ICD-10-CM | POA: Diagnosis not present

## 2020-04-04 DIAGNOSIS — D75839 Thrombocytosis, unspecified: Secondary | ICD-10-CM | POA: Diagnosis not present

## 2020-04-04 DIAGNOSIS — I495 Sick sinus syndrome: Secondary | ICD-10-CM | POA: Diagnosis present

## 2020-04-04 DIAGNOSIS — R0602 Shortness of breath: Secondary | ICD-10-CM

## 2020-04-04 DIAGNOSIS — Z7901 Long term (current) use of anticoagulants: Secondary | ICD-10-CM

## 2020-04-04 DIAGNOSIS — G8191 Hemiplegia, unspecified affecting right dominant side: Secondary | ICD-10-CM | POA: Diagnosis present

## 2020-04-04 DIAGNOSIS — G441 Vascular headache, not elsewhere classified: Secondary | ICD-10-CM | POA: Diagnosis present

## 2020-04-04 DIAGNOSIS — Z79899 Other long term (current) drug therapy: Secondary | ICD-10-CM

## 2020-04-04 DIAGNOSIS — R509 Fever, unspecified: Secondary | ICD-10-CM

## 2020-04-04 DIAGNOSIS — Z8673 Personal history of transient ischemic attack (TIA), and cerebral infarction without residual deficits: Secondary | ICD-10-CM

## 2020-04-04 DIAGNOSIS — D649 Anemia, unspecified: Secondary | ICD-10-CM | POA: Diagnosis present

## 2020-04-04 DIAGNOSIS — R55 Syncope and collapse: Secondary | ICD-10-CM | POA: Diagnosis present

## 2020-04-04 DIAGNOSIS — E872 Acidosis: Secondary | ICD-10-CM | POA: Diagnosis not present

## 2020-04-04 DIAGNOSIS — R609 Edema, unspecified: Secondary | ICD-10-CM | POA: Diagnosis not present

## 2020-04-04 DIAGNOSIS — R001 Bradycardia, unspecified: Secondary | ICD-10-CM

## 2020-04-04 DIAGNOSIS — G936 Cerebral edema: Secondary | ICD-10-CM | POA: Diagnosis present

## 2020-04-04 DIAGNOSIS — I619 Nontraumatic intracerebral hemorrhage, unspecified: Secondary | ICD-10-CM | POA: Diagnosis not present

## 2020-04-04 DIAGNOSIS — I636 Cerebral infarction due to cerebral venous thrombosis, nonpyogenic: Principal | ICD-10-CM | POA: Diagnosis present

## 2020-04-04 DIAGNOSIS — G919 Hydrocephalus, unspecified: Secondary | ICD-10-CM | POA: Diagnosis present

## 2020-04-04 DIAGNOSIS — G08 Intracranial and intraspinal phlebitis and thrombophlebitis: Secondary | ICD-10-CM

## 2020-04-04 DIAGNOSIS — Z20822 Contact with and (suspected) exposure to covid-19: Secondary | ICD-10-CM | POA: Diagnosis present

## 2020-04-04 DIAGNOSIS — D72829 Elevated white blood cell count, unspecified: Secondary | ICD-10-CM | POA: Diagnosis present

## 2020-04-04 HISTORY — DX: Nontraumatic intracerebral hemorrhage, unspecified: I61.9

## 2020-04-04 LAB — CBC
HCT: 31 % — ABNORMAL LOW (ref 36.0–46.0)
Hemoglobin: 8.9 g/dL — ABNORMAL LOW (ref 12.0–15.0)
MCH: 21.9 pg — ABNORMAL LOW (ref 26.0–34.0)
MCHC: 28.7 g/dL — ABNORMAL LOW (ref 30.0–36.0)
MCV: 76.2 fL — ABNORMAL LOW (ref 80.0–100.0)
Platelets: 532 10*3/uL — ABNORMAL HIGH (ref 150–400)
RBC: 4.07 MIL/uL (ref 3.87–5.11)
RDW: 18.5 % — ABNORMAL HIGH (ref 11.5–15.5)
WBC: 7.7 10*3/uL (ref 4.0–10.5)
nRBC: 0 % (ref 0.0–0.2)

## 2020-04-04 LAB — COMPREHENSIVE METABOLIC PANEL
ALT: 182 U/L — ABNORMAL HIGH (ref 0–44)
AST: 76 U/L — ABNORMAL HIGH (ref 15–41)
Albumin: 3.9 g/dL (ref 3.5–5.0)
Alkaline Phosphatase: 87 U/L (ref 38–126)
Anion gap: 13 (ref 5–15)
BUN: 11 mg/dL (ref 6–20)
CO2: 20 mmol/L — ABNORMAL LOW (ref 22–32)
Calcium: 8.8 mg/dL — ABNORMAL LOW (ref 8.9–10.3)
Chloride: 102 mmol/L (ref 98–111)
Creatinine, Ser: 0.74 mg/dL (ref 0.44–1.00)
GFR, Estimated: >60 mL/min (ref >60)
Glucose, Bld: 112 mg/dL — ABNORMAL HIGH (ref 70–99)
Potassium: 3.7 mmol/L (ref 3.5–5.1)
Sodium: 135 mmol/L (ref 135–145)
Total Bilirubin: 0.7 mg/dL (ref 0.3–1.2)
Total Protein: 7.6 g/dL (ref 6.5–8.1)

## 2020-04-04 LAB — DIFFERENTIAL
Abs Immature Granulocytes: 0.04 10*3/uL (ref 0.00–0.07)
Basophils Absolute: 0.1 10*3/uL (ref 0.0–0.1)
Basophils Relative: 1 %
Eosinophils Absolute: 0 10*3/uL (ref 0.0–0.5)
Eosinophils Relative: 0 %
Immature Granulocytes: 1 %
Lymphocytes Relative: 19 %
Lymphs Abs: 1.5 10*3/uL (ref 0.7–4.0)
Monocytes Absolute: 0.4 10*3/uL (ref 0.1–1.0)
Monocytes Relative: 5 %
Neutro Abs: 5.8 10*3/uL (ref 1.7–7.7)
Neutrophils Relative %: 74 %

## 2020-04-04 LAB — SODIUM: Sodium: 132 mmol/L — ABNORMAL LOW (ref 135–145)

## 2020-04-04 LAB — PROTIME-INR
INR: 1.8 — ABNORMAL HIGH (ref 0.8–1.2)
Prothrombin Time: 20.2 seconds — ABNORMAL HIGH (ref 11.4–15.2)

## 2020-04-04 LAB — I-STAT BETA HCG BLOOD, ED (MC, WL, AP ONLY): I-stat hCG, quantitative: <5 m[IU]/mL (ref <5)

## 2020-04-04 LAB — RESPIRATORY PANEL BY RT PCR (FLU A&B, COVID)
Influenza A by PCR: NEGATIVE
Influenza B by PCR: NEGATIVE
SARS Coronavirus 2 by RT PCR: NEGATIVE

## 2020-04-04 LAB — APTT: aPTT: 28 seconds (ref 24–36)

## 2020-04-04 MED ORDER — TOPIRAMATE 25 MG PO TABS
25.0000 mg | ORAL_TABLET | Freq: Two times a day (BID) | ORAL | Status: DC
  Administered 2020-04-04: 25 mg via ORAL
  Filled 2020-04-04: qty 1

## 2020-04-04 MED ORDER — DEXAMETHASONE SODIUM PHOSPHATE 10 MG/ML IJ SOLN
10.0000 mg | Freq: Once | INTRAMUSCULAR | Status: AC
  Administered 2020-04-04: 10 mg via INTRAVENOUS
  Filled 2020-04-04: qty 1

## 2020-04-04 MED ORDER — MANNITOL 20 % IV SOLN
66.0000 g | Freq: Once | Status: AC
  Administered 2020-04-04: 66 g via INTRAVENOUS
  Filled 2020-04-04: qty 330

## 2020-04-04 MED ORDER — LEVETIRACETAM 500 MG PO TABS
500.0000 mg | ORAL_TABLET | Freq: Two times a day (BID) | ORAL | Status: DC
  Administered 2020-04-04 – 2020-04-11 (×14): 500 mg via ORAL
  Filled 2020-04-04 (×14): qty 1

## 2020-04-04 MED ORDER — SODIUM CHLORIDE 0.9% FLUSH
3.0000 mL | Freq: Once | INTRAVENOUS | Status: AC
Start: 2020-04-04 — End: 2020-04-04
  Administered 2020-04-04: 3 mL via INTRAVENOUS

## 2020-04-04 MED ORDER — DEXAMETHASONE SODIUM PHOSPHATE 4 MG/ML IJ SOLN
4.0000 mg | Freq: Four times a day (QID) | INTRAMUSCULAR | Status: DC
  Administered 2020-04-05 – 2020-04-07 (×9): 4 mg via INTRAVENOUS
  Filled 2020-04-04 (×9): qty 1

## 2020-04-04 MED ORDER — ACETAMINOPHEN 650 MG RE SUPP: 650.0000 mg | RECTAL | Status: DC | PRN

## 2020-04-04 MED ORDER — SODIUM CHLORIDE 3 % IV SOLN
INTRAVENOUS | Status: DC
  Filled 2020-04-04 (×14): qty 500

## 2020-04-04 MED ORDER — ACETAMINOPHEN 325 MG PO TABS
650.0000 mg | ORAL_TABLET | ORAL | Status: DC | PRN
  Administered 2020-04-04 – 2020-04-09 (×20): 650 mg via ORAL
  Filled 2020-04-04 (×20): qty 2

## 2020-04-04 MED ORDER — STROKE: EARLY STAGES OF RECOVERY BOOK
Freq: Once | Status: AC
  Filled 2020-04-04: qty 1

## 2020-04-04 MED ORDER — ACETAMINOPHEN 160 MG/5ML PO SOLN: 650.0000 mg | ORAL | Status: DC | PRN

## 2020-04-04 MED ORDER — IOHEXOL 300 MG/ML  SOLN
75.0000 mL | Freq: Once | INTRAMUSCULAR | Status: AC | PRN
  Administered 2020-04-04: 75 mL via INTRAVENOUS

## 2020-04-04 MED ORDER — PANTOPRAZOLE SODIUM 40 MG IV SOLR
40.0000 mg | Freq: Every day | INTRAVENOUS | Status: DC
  Administered 2020-04-04: 40 mg via INTRAVENOUS
  Filled 2020-04-04: qty 40

## 2020-04-04 MED ORDER — SENNOSIDES-DOCUSATE SODIUM 8.6-50 MG PO TABS
1.0000 | ORAL_TABLET | Freq: Two times a day (BID) | ORAL | Status: DC
  Administered 2020-04-04 – 2020-04-11 (×9): 1 via ORAL
  Filled 2020-04-04 (×10): qty 1

## 2020-04-04 MED ORDER — PHYTONADIONE 5 MG PO TABS
2.5000 mg | ORAL_TABLET | Freq: Once | ORAL | Status: AC
  Administered 2020-04-04: 2.5 mg via ORAL
  Filled 2020-04-04 (×2): qty 1

## 2020-04-04 MED ORDER — ONDANSETRON HCL 4 MG/2ML IJ SOLN
4.0000 mg | Freq: Once | INTRAMUSCULAR | Status: AC
  Administered 2020-04-04: 4 mg via INTRAVENOUS
  Filled 2020-04-04: qty 2

## 2020-04-04 MED ORDER — HEPARIN (PORCINE) 25000 UT/250ML-% IV SOLN
400.0000 [IU]/h | INTRAVENOUS | Status: DC
  Administered 2020-04-04: 700 [IU]/h via INTRAVENOUS
  Administered 2020-04-06: 500 [IU]/h via INTRAVENOUS
  Filled 2020-04-04 (×2): qty 250

## 2020-04-04 NOTE — ED Notes (Signed)
Patient resting peacefully in Rt lateral position. Husband remains at bedside, provided with reclining chair. They deny further needs

## 2020-04-04 NOTE — ED Notes (Signed)
Message sent to pharmacy regarding Decadron dose times/possible adjustment.

## 2020-04-04 NOTE — H&P (Addendum)
NEUROLOGY H&P  CC: headache, N&V  HPI: Erica Rosario is an 44 y.o. 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 She is now again presenting to the ED with a chief complaint of headache when she woke up this morning with intermittent worsening headache she describes as aching around the front and left side of her head: 10 out of 10. Reports generalized weakness and "faintness", but has no focal deficits. She remains nauseated and has had several episodes of emesis. No injury or trauma. Denies vision changes, but has trouble with vision exam, mainly on right it is most difficult,yet no clear field cut(this was noted a week ago on neuro exam). She reports difficulty in concentration, esp with numbers as she cannot give correct age or do simple math calculation.   History obtained via medical interpreter in [place]. Husband at bedside and corroborates the history.  Past Medical History Past Medical History:  Diagnosis Date   Intracerebral hemorrhage [facility])     Past Surgical History History reviewed. No pertinent surgical history.  Family History No family history on file.  Social History    reports that she has never smoked. She has never used smokeless tobacco. She reports previous alcohol use. She reports that she does not use drugs.  Allergies No Known Allergies  Home Medications (Not in a hospital admission)   Hospital Medications  dexamethasone (DECADRON) injection  10 mg Intravenous Once   [START ON 04/05/2020] dexamethasone (DECADRON) injection  4 mg Intravenous Q6H     ROS: History obtained from pt  General ROS:+fatigue; negative for - chills, , fever, night sweats, weight gain or weight loss Psychological ROS: negative for - behavioral disorder, hallucinations, memory difficulties, mood swings or suicidal ideation Ophthalmic ROS: negative for  - blurry vision, double vision, eye pain or loss of vision ENT ROS: negative for - epistaxis, nasal discharge, oral lesions, sore throat, tinnitus or vertigo Allergy and Immunology ROS: negative for - hives or itchy/watery eyes Hematological and Lymphatic ROS: negative for - bleeding problems, bruising or swollen lymph nodes Endocrine ROS: negative for - galactorrhea, hair pattern changes, polydipsia/polyuria or temperature intolerance Respiratory ROS: negative for - cough, hemoptysis, shortness of breath or wheezing Cardiovascular ROS: negative for - chest pain, dyspnea on exertion, edema or irregular heartbeat Gastrointestinal ROS: + N/V. Negative for - abdominal pain, diarrhea, hematemesis or stool incontinence Genito-Urinary ROS: negative for - dysuria, hematuria, incontinence or urinary frequency/urgency Musculoskeletal ROS: negative for - joint swelling or muscular weakness Neurological ROS: as noted in HPI Dermatological ROS: negative for rash and skin lesion changes   Physical Examination:  Vitals:   04/04/20 1407 04/04/20 1535  BP: (!) 113/59 116/74  Pulse: (!) 110 77  Resp: 18 20  Temp: 98.1 F (36.7 C) 98.3 F (36.8 C)  TempSrc: Oral Oral  SpO2: 95% 100%    General - moderate distress; uncomfortable Heart - Regular rate and rhythm - no murmer Lungs - Clear to auscultation Abdomen - Soft - non tender Extremities - Distal pulses intact - no edema Skin - Warm and dry  Neurologic Examination:   Mental Status:  Alert, oriented with exception of getting age incorrect. States she has difficulty concentrating, esp with numbers as she is unable to do simple math question such as how many quarters make up $1.25.Thought content is slow, but appropriate. Speech without evidence of dysarthria or aphasia. Able to  follow 3 step commands without difficulty.  Cranial Nerves:  II-bilateral visual fields intact, with some "blurred" description on right field, but no clear field  cut III/IV/VI-Pupils were equal and reacted. Extraocular movements were full.  V/VII-no facial numbness and no facial weakness.  VIII-hearing normal.  X-normal speech and symmetrical palatal movement.  XII-midline tongue extension  Motor: Right : Upper extremity   5/5    Left:     Upper extremity   5/5  Lower extremity   5/5     Lower extremity   5/5 Tone and bulk:normal tone throughout; no atrophy noted Sensory: Intact to light touch in all extremities. Deep Tendon Reflexes: 4/4 in BL lower extremities with + ankle clonus and + cross adductors. In the upper extremities, she is 2+ throughout and symmetric. Plantars: Downgoing bilaterally  Cerebellar: Slower with right FTN, slight dysmetria noted. normal finger to nose on left and heel to shin bilaterally. Able to preform rapid movements. Gait: not tested   LABORATORY STUDIES:  Basic Metabolic Panel: Recent Labs  Lab 03/30/20 0046 03/30/20 0046 03/31/20 0323 03/31/20 0323 04/01/20 0651 04/02/20 0558 04/04/20 1423  NA 139  --  140  --  139 138 135  K 4.0  --  3.7  --  3.8 3.9 3.7  CL 109  --  109  --  106 104 102  CO2 19*  --  22  --  22 23 20*  GLUCOSE 91  --  84  --  85 94 112*  BUN <5*  --  <5*  --  6 9 11   CREATININE 0.67  --  0.70  --  0.70 0.68 0.74  CALCIUM 8.7*   < > 8.9   < > 9.0 8.9 8.8*   < > = values in this interval not displayed.    Liver Function Tests: Recent Labs  Lab 04/01/20 0651 04/02/20 0558 04/04/20 1423  AST 150* 111* 76*  ALT 262* 229* 182*  ALKPHOS 78 82 87  BILITOT 0.3 0.4 0.7  PROT 6.9 6.9 7.6  ALBUMIN 3.3* 3.4* 3.9   No results for input(s): LIPASE, AMYLASE in the last 168 hours. No results for input(s): AMMONIA in the last 168 hours.  CBC: Recent Labs  Lab 03/30/20 0046 03/31/20 0323 04/01/20 0651 04/02/20 0558 04/04/20 1423  WBC 7.1 6.3 6.6 6.8 7.7  NEUTROABS  --   --  3.4  --  5.8  HGB 8.2* 8.3* 8.7* 8.8* 8.9*  HCT 27.7* 27.9* 29.4* 28.7* 31.0*  MCV 74.5* 74.2* 73.0*  74.0* 76.2*  PLT 347 377 429* 427* 532*    Cardiac Enzymes: No results for input(s): CKTOTAL, CKMB, CKMBINDEX, TROPONINI in the last 168 hours.  BNP: Invalid input(s): POCBNP  CBG: No results for input(s): GLUCAP in the last 168 hours.  Microbiology:   Coagulation Studies: Recent Labs    04/02/20 0558 04/03/20 0457 04/04/20 1423  LABPROT 24.7* 22.5* 20.2*  INR 2.3* 2.1* 1.8*    Urinalysis: No results for input(s): COLORURINE, LABSPEC, PHURINE, GLUCOSEU, HGBUR, BILIRUBINUR, KETONESUR, PROTEINUR, UROBILINOGEN, NITRITE, LEUKOCYTESUR in the last 168 hours.  Invalid input(s): APPERANCEUR  Lipid Panel:     Component Value Date/Time   CHOL 210 (H) 03/26/2020 1010   TRIG 132 03/26/2020 1010   HDL 53 03/26/2020 1010   CHOLHDL 4.0 03/26/2020 1010   VLDL 26 03/26/2020 1010   LDLCALC NOT CALCULATED 03/26/2020 1010    HgbA1C:  Lab Results  Component Value Date   HGBA1C 5.6 03/26/2020  Urine Drug Screen:      Component Value Date/Time   LABOPIA NONE DETECTED 03/26/2020 1647   COCAINSCRNUR NONE DETECTED 03/26/2020 1647   LABBENZ POSITIVE (A) 03/26/2020 1647   AMPHETMU NONE DETECTED 03/26/2020 1647   THCU NONE DETECTED 03/26/2020 1647   LABBARB NONE DETECTED 03/26/2020 1647     Alcohol Level:  No results for input(s): ETH in the last 168 hours.  Miscellaneous labs:  EKG EKG  IMAGING: CT VENOGRAM HEAD  Result Date: 04/04/2020 CLINICAL DATA:  Headache, weakness, nausea, and vomiting today. Recent venous thrombosis and infarct. EXAM: CT VENOGRAM HEAD TECHNIQUE: Contiguous axial images were obtained from the base of the skull through the vertex without intravenous contrast. Multidetector CT imaging of the head was performed using the standard protocol during bolus administration of intravenous contrast. Multiplanar CT image reconstructions and MIPs were obtained to evaluate the vascular anatomy. CONTRAST:  67mL OMNIPAQUE IOHEXOL 300 MG/ML  SOLN COMPARISON:   03/28/2020 FINDINGS: CT HEAD Brain: Extensive edema in the left parietal and occipital lobes related to the known venous infarct has increased. There is increased regional mass effect with 13 mm of rightward midline shift, and there is new mild dilatation of the right lateral ventricle including of the temporal horn. Multifocal hemorrhage within the infarct has decreased in density, and no new hemorrhage is identified. No contralateral infarct is identified. There is no extra-axial fluid collection. Vascular: Evaluated below. Skull: No fracture or suspicious osseous lesion. Sinuses: Visualized paranasal sinuses and mastoid air cells are clear. Orbits: Unremarkable. CTV HEAD Nonocclusive thrombus in the midportion of the superior sagittal sinus has decreased in volume. The superior sagittal sinus remains patent without new thrombus identified. Thrombus within an adjacent left-sided cortical vein is less conspicuous than on the prior study. The internal cerebral veins, vein of Galen, straight sinus, transverse sinuses, sigmoid sinuses, and jugular bulbs remain patent without evidence of thrombus. There is scattered non masslike enhancement within the venous infarct. IMPRESSION: 1. Left parieto-occipital venous infarct with increased edema and mass effect including 13 mm of rightward midline shift. New mild dilatation of the right lateral ventricle suggesting early trapping. 2. Decreased volume of nonocclusive superior sagittal sinus thrombus. 3. Decreased conspicuity of thrombus within an adjacent left cortical vein. 4. No new venous thrombus identified. The study was discussed by telephone with Dr. Lorrin Goodell on 04/04/2020 at 5:55 p.m. Electronically Signed   By: Logan Bores M.D.   On: 04/04/2020 18:13     Assessment/Plan: This is a 44yr old Hispanic lady who re-presents to the ER one day after d/c for worsening vasogenic edema on CTH in the setting of known dural sinus thrombosis, now with worsening midline  shift. She has not had any seizures. She was d/c home on Keppra,Topamx and Coumadin. Neuro exam with blurred R hemivisual field but that was also noted on prior neuro exams. CT Venogram here with decrease in the size of the noted non occlusive sinus thrombosis.  1. Recent ICH (10/6)- Etiology was secondary to SSS thrombosis and venous infarct. She was treated with IV Heparin and d/c on Coumadin  -Today's CTH shows resolving blood, but increased vasogenic edema.( d/w radiology) 2. Clinically significant cerebral edema- NSGY consulted; Mannitol & Decadron started. 3. Vascular h/a- d/t above. Tx symptomatically. 4. Seizures- Con't keppra; seizure precautions. No seizures since previous admit reported  PLAN: Admit to ICU for close monitoring, greater than 2 MN Close neuro checks, If GCS changes by 2 points, call NSGY immediately Con't keppra and Topamax  HOLD Coumadin, Vit K 2.5mg  once. Will start her on heparin gtt - much easier to reverse with protamine in case of surgery. SCDs NSGY consulted, case discussed Decadron 10mg  once STAT Mannitol 1G/Kg once HTS 75cc/hr with goal sodium 145-155. Seizure precautions We will continue to follow along.  Attending neurologist's note to follow  Dollye Glasser Metzger-Cihelka, ARNP-C, ANVP-BC Pager: [phone]

## 2020-04-04 NOTE — ED Triage Notes (Signed)
Pt discharged 10/14 after L intracerebral hemorrhage.  Woke up with 10/10 headache this morning, generalized weakness, nausea, and vomiting.

## 2020-04-04 NOTE — Progress Notes (Signed)
ANTICOAGULATION CONSULT NOTE - Initial Consult  Pharmacy Consult for heparin Indication: Dural venous sinus thrombosis  No Known Allergies  Patient Measurements:   Heparin Dosing Weight: TBW  Vital Signs: Temp: 98.3 F (36.8 C) (10/16 1535) Temp Source: Oral (10/16 1535) BP: 116/74 (10/16 1535) Pulse Rate: 77 (10/16 1535)  Labs: Recent Labs    04/02/20 0558 04/03/20 0457 04/04/20 1423  HGB 8.8*  --  8.9*  HCT 28.7*  --  31.0*  PLT 427*  --  532*  APTT  --   --  28  LABPROT 24.7* 22.5* 20.2*  INR 2.3* 2.1* 1.8*  HEPARINUNFRC 0.67  --   --   CREATININE 0.68  --  0.74    Estimated Creatinine Clearance: 75.4 mL/min (by C-G formula based on SCr of 0.74 mg/dL).   Medical History: Past Medical History:  Diagnosis Date  . 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  INR 1.8 on admission, receiving 2.5mg  vitamin K per MD to bring INR down some and to start heparin gtt in meantime for potential surgery.       Goal of Therapy:  Heparin level 0.3-0.5 units/ml Monitor platelets by anticoagulation protocol: Yes   Plan:  Heparin gtt at 700 units/hr, no bolus F/u 6 hour heparin level Daily heparin level, CBC, s/s bleeding  Bertis Ruddy, PharmD Clinical Pharmacist ED Pharmacist Phone # [phone] 04/04/2020 7:01 PM

## 2020-04-04 NOTE — Consult Note (Signed)
   Providing Compassionate, Quality Care - Together  Neurosurgery Consult  Referring physician: Dr. Lorrin Goodell Reason for referral: CVA  Chief Complaint: Headache  History of Present Illness: This is a 44 year old Spanish-speaking female with a recent history of left parietal ICH with dural sagittal sinus thrombosis with stroke that returns to the hospital with severe headache.  She was discharged yesterday with Keppra, Topamax and Coumadin.  Her headache when she woke up this morning was 10/10.  She does have some slight faint feeling.  She denies any new weakness, numbness, tingling.  She does have nausea and has had multiple episodes of emesis.  History obtained via interpreter as she is Spanish-speaking with her husband at bedside who is also Spanish-speaking.   Medications: I have reviewed the patient's current medications. Allergies: No Known Allergies  History reviewed. No pertinent family history. Social History:  has no history on file for tobacco use, alcohol use, and drug use.  ROS: 14 point review of system was obtained which all pertinent positive negatives listed in HPI above  Physical Exam:  Vital signs in last 24 hours: Temp:  [98 F (36.7 C)-98.3 F (36.8 C)] 98 F (36.7 C) (07/25 1814) Pulse Rate:  [58-128] 65 (07/26 0746) Resp:  [11-18] 14 (07/26 0217) BP: (138-182)/(65-125) 153/88 (07/26 0700) SpO2:  [91 %-98 %] 96 % (07/26 0746) PE: AOx3, Spanish-speaking Speech appropriate, fluid Face symmetric EOMI PERRLA Follows commands x4  Motor 5/5 bilateral upper extremity and lower extremities Sensory intact light touch Cranial nerves II through XII intact  Impression/Assessment:  44 year old female with  1.  Sagittal sinus venous thrombosis with associated venous infarct and intracerebral hemorrhage with significant mass-effect and midline shift of 13 mm  Plan:  -Admission per neurology, stroke team -No surgical decompression at this time as patient is  GCS 15, wide-awake. -ICU care with every hour neuro checks -Continue Keppra -I discussed with the hospitalist/neurologist about transitioning to IV heparin instead of Coumadin due to the significant amount of cerebral edema and midline shift.  There is a potential that she may need an emergent decompressive hemicraniectomy therefore it would be safer to emergently reverse heparin rather than Coumadin. -Aggressive ICP management with hypertonic saline/mannitol/head of bed at 30 degrees per neurology/stroke team -I recommend a sodium goal of 145-155 -Page neurosurgery if there is any significant neurologic change greater than 2 points on GCS scale  -I discussed the details and goals of the surgery with the patient and her husband at the bedside.  They agreed that if it is needed they would like to proceed.   Thank you for allowing me to participate in this patient's care.  Please do not hesitate to call with questions or concerns.   Elwin Sleight, [facility] Cell: [phone]

## 2020-04-04 NOTE — ED Provider Notes (Signed)
[place] EMERGENCY DEPARTMENT Provider Note   CSN: [id number] Arrival date & time: 04/04/20  1346     History Chief Complaint  Patient presents with  . Headache    Erica Rosario is a 44 y.o. female with a past medical history of ICH, dural sinus thrombosis discharged from hospital yesterday currently on Keppra, Topamax and Coumadin presenting to the ED with a chief complaint of headache.  Had a slight headache yesterday when she went to bed.  Woke up this morning with worsening headache she describes as aching, 10 out of 10.  Minimal improvement noted with over-the-counter pain medicine.  Reports generalized weakness and "faintness."  Has had several episodes of nonbloody, nonbilious emesis.  No injury or trauma.  No numbness in arms or legs.  No vision changes.  She denies any chest pain, abdominal pain, changes to gait.  History obtained via medical interpreter in [place]. HPI     Past Medical History:  Diagnosis Date  . Intracerebral hemorrhage [facility])     Patient Active Problem List   Diagnosis Date Noted  . Vascular headache   . Nausea without vomiting   . Thrombosis of superior sagittal sinus   . Transaminitis   . Diarrhea   . 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 Seizure ([place]) 03/31/2020  . Acute cerebral venous infarction associated with CSVT ([place]) 03/31/2020  . Hypercoagulable state ([place]) 03/31/2020  . Hyperlipidemia 03/31/2020  . Overweight 03/31/2020  . Anemia 03/31/2020  . 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 Left-sided nontraumatic intracerebral hemorrhage ([place]) 03/25/2020    History reviewed. No pertinent surgical history.   OB History   No obstetric history on file.     No family history on file.  Social History   Tobacco Use  . Smoking status: Never Smoker  . Smokeless tobacco: Never Used  Substance Use Topics  . Alcohol use: Not Currently  . Drug use: Never    Home Medications Prior to Admission  medications   Medication Sig Start Date End Date Taking? Authorizing Provider  acetaminophen (TYLENOL) 325 MG tablet Take 2 tablets (650 mg total) by mouth every 4 (four) hours as needed for mild pain (or temp > 37.5 C (99.5 F)). 03/31/20   Donzetta Starch, NP  atorvastatin (LIPITOR) 40 MG tablet Take 1 tablet (40 mg total) by mouth daily. 04/02/20   Angiulli, Lavon Paganini, PA-C  butalbital-acetaminophen-caffeine (FIORICET) [phone] MG tablet Take 1 tablet by mouth every 8 (eight) hours as needed for headache. 04/02/20   Angiulli, Lavon Paganini, PA-C  levETIRAcetam (KEPPRA) 500 MG tablet Take 1 tablet (500 mg total) by mouth 2 (two) times daily. 04/02/20   Angiulli, Lavon Paganini, PA-C  pantoprazole (PROTONIX) 40 MG tablet Take 1 tablet (40 mg total) by mouth daily. 04/03/20   Jamse Arn, MD  topiramate (TOPAMAX) 25 MG tablet Take 1 tablet (25 mg total) by mouth 2 (two) times daily. 04/03/20   Jamse Arn, MD  warfarin (COUMADIN) 4 MG tablet Take 1 tablet (4 mg total) by mouth one time only at 4 PM. 04/03/20   Angiulli, Lavon Paganini, PA-C    Allergies    Patient has no known allergies.  Review of Systems   Review of Systems  Constitutional: Positive for fatigue. Negative for appetite change, chills and fever.  HENT: Negative for ear pain, rhinorrhea, sneezing and sore throat.   Eyes: Negative for photophobia and visual disturbance.  Respiratory: Negative for cough, chest tightness, shortness of  breath and wheezing.   Cardiovascular: Negative for chest pain and palpitations.  Gastrointestinal: Positive for nausea and vomiting. Negative for abdominal pain, blood in stool, constipation and diarrhea.  Genitourinary: Negative for dysuria, hematuria and urgency.  Musculoskeletal: Negative for myalgias.  Skin: Negative for rash.  Neurological: Positive for light-headedness and headaches. Negative for dizziness and weakness.    Physical Exam Updated Vital Signs BP 116/74 (BP Location: Left Arm)    Pulse 77   Temp 98.3 F (36.8 C) (Oral)   Resp 20   LMP 03/28/2020   SpO2 100%   Physical Exam Vitals and nursing note reviewed.  Constitutional:      General: She is not in acute distress.    Appearance: She is well-developed.  HENT:     Head: Normocephalic and atraumatic.     Nose: Nose normal.  Eyes:     General: No scleral icterus.       Right eye: No discharge.        Left eye: No discharge.     Conjunctiva/sclera: Conjunctivae normal.  Cardiovascular:     Rate and Rhythm: Normal rate and regular rhythm.     Heart sounds: Normal heart sounds. No murmur heard.  No friction rub. No gallop.   Pulmonary:     Effort: Pulmonary effort is normal. No respiratory distress.     Breath sounds: Normal breath sounds.  Abdominal:     General: Bowel sounds are normal. There is no distension.     Palpations: Abdomen is soft.     Tenderness: There is no abdominal tenderness. There is no guarding.  Musculoskeletal:        General: Normal range of motion.     Cervical back: Normal range of motion and neck supple.  Skin:    General: Skin is warm and dry.     Findings: No rash.  Neurological:     General: No focal deficit present.     Mental Status: She is alert and oriented to person, place, and time.     Cranial Nerves: No cranial nerve deficit.     Sensory: No sensory deficit.     Motor: No weakness or abnormal muscle tone.     Coordination: Coordination normal.     Comments: Pupils reactive. No facial asymmetry noted. Cranial nerves appear grossly intact. Sensation intact to light touch on face, BUE and BLE. Strength 5/5 in BUE and BLE     ED Results / Procedures / Treatments   Labs (all labs ordered are listed, but only abnormal results are displayed) Labs Reviewed  PROTIME-INR - Abnormal; Notable for the following components:      Result Value   Prothrombin Time 20.2 (*)    INR 1.8 (*)    All other components within normal limits  CBC - Abnormal; Notable for the  following components:   Hemoglobin 8.9 (*)    HCT 31.0 (*)    MCV 76.2 (*)    MCH 21.9 (*)    MCHC 28.7 (*)    RDW 18.5 (*)    Platelets 532 (*)    All other components within normal limits  COMPREHENSIVE METABOLIC PANEL - Abnormal; Notable for the following components:   CO2 20 (*)    Glucose, Bld 112 (*)    Calcium 8.8 (*)    AST 76 (*)    ALT 182 (*)    All other components within normal limits  RESPIRATORY PANEL BY RT PCR (FLU A&B, COVID)  APTT  DIFFERENTIAL  SODIUM  SODIUM  SODIUM  I-STAT BETA HCG BLOOD, ED ([facility], [facility], [facility] ONLY)    EKG EKG Interpretation  Date/Time:  Saturday April 04 2020 14:24:15 EDT Ventricular Rate:  76 PR Interval:  122 QRS Duration: 86 QT Interval:  394 QTC Calculation: 443 R Axis:   9 Text Interpretation: Normal sinus rhythm Normal ECG Poor data quality in current ECG precludes serial comparison Confirmed by Sherwood Gambler [phone]) on 04/04/2020 6:08:14 PM   Radiology CT VENOGRAM HEAD  Result Date: 04/04/2020 CLINICAL DATA:  Headache, weakness, nausea, and vomiting today. Recent venous thrombosis and infarct. EXAM: CT VENOGRAM HEAD TECHNIQUE: Contiguous axial images were obtained from the base of the skull through the vertex without intravenous contrast. Multidetector CT imaging of the head was performed using the standard protocol during bolus administration of intravenous contrast. Multiplanar CT image reconstructions and MIPs were obtained to evaluate the vascular anatomy. CONTRAST:  69mL OMNIPAQUE IOHEXOL 300 MG/ML  SOLN COMPARISON:  03/28/2020 FINDINGS: CT HEAD Brain: Extensive edema in the left parietal and occipital lobes related to the known venous infarct has increased. There is increased regional mass effect with 13 mm of rightward midline shift, and there is new mild dilatation of the right lateral ventricle including of the temporal horn. Multifocal hemorrhage within the infarct has decreased in density, and no new hemorrhage is  identified. No contralateral infarct is identified. There is no extra-axial fluid collection. Vascular: Evaluated below. Skull: No fracture or suspicious osseous lesion. Sinuses: Visualized paranasal sinuses and mastoid air cells are clear. Orbits: Unremarkable. CTV HEAD Nonocclusive thrombus in the midportion of the superior sagittal sinus has decreased in volume. The superior sagittal sinus remains patent without new thrombus identified. Thrombus within an adjacent left-sided cortical vein is less conspicuous than on the prior study. The internal cerebral veins, vein of Galen, straight sinus, transverse sinuses, sigmoid sinuses, and jugular bulbs remain patent without evidence of thrombus. There is scattered non masslike enhancement within the venous infarct. IMPRESSION: 1. Left parieto-occipital venous infarct with increased edema and mass effect including 13 mm of rightward midline shift. New mild dilatation of the right lateral ventricle suggesting early trapping. 2. Decreased volume of nonocclusive superior sagittal sinus thrombus. 3. Decreased conspicuity of thrombus within an adjacent left cortical vein. 4. No new venous thrombus identified. The study was discussed by telephone with Dr. Lorrin Goodell on 04/04/2020 at 5:55 p.m. Electronically Signed   By: Logan Bores M.D.   On: 04/04/2020 18:13    Procedures .Critical Care Performed by: Delia Heady, PA-C Authorized by: Delia Heady, PA-C   Critical care provider statement:    Critical care time (minutes):  35   Critical care was necessary to treat or prevent imminent or life-threatening deterioration of the following conditions:  CNS failure or compromise   Critical care was time spent personally by me on the following activities:  Development of treatment plan with patient or surrogate, discussions with consultants, evaluation of patient's response to treatment, examination of patient, obtaining history from patient or surrogate, ordering and  performing treatments and interventions, ordering and review of laboratory studies, ordering and review of radiographic studies, re-evaluation of patient's condition and review of old charts   I assumed direction of critical care for this patient from another provider in my specialty: no     (including critical care time)  Medications Ordered in ED Medications  dexamethasone (DECADRON) injection 10 mg (has no administration in time range)  sodium chloride (hypertonic) 3 % solution (has  no administration in time range)  dexamethasone (DECADRON) injection 4 mg (has no administration in time range)  sodium chloride flush (NS) 0.9 % injection 3 mL (3 mLs Intravenous Given 04/04/20 1840)  ondansetron (ZOFRAN) injection 4 mg (4 mg Intravenous Given 04/04/20 1636)  mannitol 20 % IVPB 66 g (0 g Intravenous Stopped 04/04/20 1840)  iohexol (OMNIPAQUE) 300 MG/ML solution 75 mL (75 mLs Intravenous Contrast Given 04/04/20 1740)    ED Course  I have reviewed the triage vital signs and the nursing notes.  Pertinent labs & imaging results that were available during my care of the patient were reviewed by me and considered in my medical decision making (see chart for details).  Clinical Course as of Apr 04 1842  Sat Apr 04, 2020  1703 Spoke to on-call neurologist Dr. Lorrin Goodell.  Reviewed patient's case, recommends CT of the head without contrast as well as a CT venogram of the head.   [HK]  1733 Have consulted on-call neurosurgeon Dr. Reatha Armour. Waiting on call-back.   [HK]  1811 Spoke to neurosurgeon over the phone who will talk to neurologist.  He reviewed patient's CT scan and lab work.  He will see the patient in consult.   [HK]    Clinical Course User Index [HK] Delia Heady, PA-C   MDM Rules/Calculators/A&P                          44 year old female with a past medical history of ICH, dural sinus thrombosis discharged from hospital yesterday after admission presenting to the ED with a chief  complaint of headache.  Slight headache yesterday when she went to bed woke up this morning with 10 out of 10 headache with nausea, vomiting and feeling faint.  Denies any vision changes, numbness in arms or legs.  She last took her Coumadin this morning.  Patient without any neurological deficits noted on my exam.  No weakness, numbness noted.  She is heaving.  EKG shows normal sinus rhythm.  Per neurology recommendations I ordered a CT of the head without contrast as well as a CT venogram of the head.  This shows parieto-occipital venous infarct on the left with increased edema and mass-effect that is new.  Further recommendations placed by the neurologist on-call.  Both I and neurologist have spoken to on-call neurosurgeon about recommendations as well.  Please see their note for [place] for further detail.  She will be admitted to neuro ICU and started on mannitol, Decadron.  Temporizing measures for now as she is awake, no significant neurological deficits.  Per neurology recommendations will need to be notified if her GCS decreases by 2 points. Appreciate the help of neurologist and neurosurgeon for management of this patient.  All imaging, if done today, including plain films, CT scans, and ultrasounds, independently reviewed by me, and interpretations confirmed via formal radiology reads.   Portions of this note were generated with [employer]. Dictation errors may occur despite best attempts at proofreading.  Final Clinical Impression(s) / ED Diagnoses Final diagnoses:  H/O cerebral venous infarction  Intracranial edema (HCC)  Left-sided nontraumatic intracerebral hemorrhage, unspecified cerebral location [facility])    Rx / DC Orders ED Discharge Orders    None       Delia Heady, PA-C 04/04/20 1845    Sherwood Gambler, MD 04/04/20 2253

## 2020-04-05 DIAGNOSIS — R569 Unspecified convulsions: Secondary | ICD-10-CM

## 2020-04-05 DIAGNOSIS — I636 Cerebral infarction due to cerebral venous thrombosis, nonpyogenic: Principal | ICD-10-CM

## 2020-04-05 LAB — SODIUM
Sodium: 141 mmol/L (ref 135–145)
Sodium: 141 mmol/L (ref 135–145)
Sodium: 141 mmol/L (ref 135–145)
Sodium: 140 mmol/L (ref 135–145)

## 2020-04-05 LAB — CBC
HCT: 26.8 % — ABNORMAL LOW (ref 36.0–46.0)
Hemoglobin: 8 g/dL — ABNORMAL LOW (ref 12.0–15.0)
MCH: 21.6 pg — ABNORMAL LOW (ref 26.0–34.0)
MCHC: 29.9 g/dL — ABNORMAL LOW (ref 30.0–36.0)
MCV: 72.4 fL — ABNORMAL LOW (ref 80.0–100.0)
Platelets: 518 10*3/uL — ABNORMAL HIGH (ref 150–400)
RBC: 3.7 MIL/uL — ABNORMAL LOW (ref 3.87–5.11)
RDW: 17.9 % — ABNORMAL HIGH (ref 11.5–15.5)
WBC: 5.5 10*3/uL (ref 4.0–10.5)
nRBC: 0 % (ref 0.0–0.2)

## 2020-04-05 LAB — FERRITIN: Ferritin: 10 ng/mL — ABNORMAL LOW (ref 11–307)

## 2020-04-05 LAB — IRON AND TIBC
Iron: 13 ug/dL — ABNORMAL LOW (ref 28–170)
Saturation Ratios: 2 % — ABNORMAL LOW (ref 10.4–31.8)
TIBC: 552 ug/dL — ABNORMAL HIGH (ref 250–450)
UIBC: 539 ug/dL

## 2020-04-05 LAB — HEPARIN LEVEL (UNFRACTIONATED)
Heparin Unfractionated: 0.21 IU/mL — ABNORMAL LOW (ref 0.30–0.70)
Heparin Unfractionated: 0.29 IU/mL — ABNORMAL LOW (ref 0.30–0.70)
Heparin Unfractionated: 0.83 IU/mL — ABNORMAL HIGH (ref 0.30–0.70)
Heparin Unfractionated: 0.75 IU/mL — ABNORMAL HIGH (ref 0.30–0.70)

## 2020-04-05 MED ORDER — CHLORHEXIDINE GLUCONATE 0.12 % MT SOLN
15.0000 mL | Freq: Two times a day (BID) | OROMUCOSAL | Status: DC
  Administered 2020-04-06 – 2020-04-10 (×10): 15 mL via OROMUCOSAL
  Filled 2020-04-05 (×10): qty 15

## 2020-04-05 MED ORDER — TOPIRAMATE 25 MG PO TABS
50.0000 mg | ORAL_TABLET | Freq: Two times a day (BID) | ORAL | Status: DC
  Administered 2020-04-05 – 2020-04-07 (×5): 50 mg via ORAL
  Filled 2020-04-05 (×5): qty 2

## 2020-04-05 MED ORDER — CHLORHEXIDINE GLUCONATE CLOTH 2 % EX PADS
6.0000 | MEDICATED_PAD | Freq: Every day | CUTANEOUS | Status: DC
  Administered 2020-04-05 – 2020-04-10 (×4): 6 via TOPICAL

## 2020-04-05 MED ORDER — FERROUS SULFATE 325 (65 FE) MG PO TABS
325.0000 mg | ORAL_TABLET | Freq: Three times a day (TID) | ORAL | Status: DC
  Administered 2020-04-05 – 2020-04-11 (×18): 325 mg via ORAL
  Filled 2020-04-05 (×18): qty 1

## 2020-04-05 MED ORDER — PANTOPRAZOLE SODIUM 40 MG PO TBEC
40.0000 mg | DELAYED_RELEASE_TABLET | Freq: Every day | ORAL | Status: DC
  Administered 2020-04-05 – 2020-04-11 (×7): 40 mg via ORAL
  Filled 2020-04-05 (×7): qty 1

## 2020-04-05 MED ORDER — ONDANSETRON HCL 4 MG/2ML IJ SOLN
4.0000 mg | Freq: Four times a day (QID) | INTRAMUSCULAR | Status: DC | PRN
  Administered 2020-04-05 – 2020-04-07 (×2): 4 mg via INTRAVENOUS
  Filled 2020-04-05 (×2): qty 2

## 2020-04-05 MED ORDER — ATORVASTATIN CALCIUM 40 MG PO TABS
40.0000 mg | ORAL_TABLET | Freq: Every day | ORAL | Status: DC
  Administered 2020-04-05 – 2020-04-11 (×7): 40 mg via ORAL
  Filled 2020-04-05 (×7): qty 1

## 2020-04-05 NOTE — Progress Notes (Addendum)
STROKE TEAM PROGRESS NOTE   INTERVAL HISTORY Her RN and husband are at the bedside.  Spanish interpretor used during encounter. Pt and husband stated that she was discharge last Friday noon. She went home, fainted once at home but later improved. However, developed HA Friday night and continued Saturday morning with N/V, feeling worse. Took coumadin Saturday noon, not better, came to ER for evaluation. She was given mannitol, 3% saline and decadron. Currently pt feels much better, no more N/V, still has some HA but responding to tylenol well. CTA head and neck showed CVST is improving, but CT head showed significant edema on the left with 59mm midline shift.    OBJECTIVE Vitals:   04/05/20 0400 04/05/20 0500 04/05/20 0600 04/05/20 0615  BP: (!) 99/56 (!) 103/54  110/65  Pulse: (!) 50 (!) 55 (!) 57 (!) 58  Resp: 18 17 17 16   Temp: 99.6 F (37.6 C)     TempSrc: Oral     SpO2: 99% 99% 100% 100%    CBC:  Recent Labs  Lab 04/01/20 0651 04/02/20 0558 04/04/20 1423 04/05/20 0240  WBC 6.6   < > 7.7 5.5  NEUTROABS 3.4  --  5.8  --   HGB 8.7*   < > 8.9* 8.0*  HCT 29.4*   < > 31.0* 26.8*  MCV 73.0*   < > 76.2* 72.4*  PLT 429*   < > 532* 518*   < > = values in this interval not displayed.    Basic Metabolic Panel:  Recent Labs  Lab 04/02/20 0558 04/02/20 0558 04/04/20 1423 04/04/20 1423 04/04/20 2114 04/05/20 0240  NA 138   < > 135   < > 132* 141  K 3.9  --  3.7  --   --   --   CL 104  --  102  --   --   --   CO2 23  --  20*  --   --   --   GLUCOSE 94  --  112*  --   --   --   BUN 9  --  11  --   --   --   CREATININE 0.68  --  0.74  --   --   --   CALCIUM 8.9  --  8.8*  --   --   --    < > = values in this interval not displayed.    Lipid Panel:     Component Value Date/Time   CHOL 210 (H) 03/26/2020 1010   TRIG 132 03/26/2020 1010   HDL 53 03/26/2020 1010   CHOLHDL 4.0 03/26/2020 1010   VLDL 26 03/26/2020 1010   LDLCALC NOT CALCULATED 03/26/2020 1010   HgbA1c:   Lab Results  Component Value Date   HGBA1C 5.6 03/26/2020   Urine Drug Screen:     Component Value Date/Time   LABOPIA NONE DETECTED 03/26/2020 1647   COCAINSCRNUR NONE DETECTED 03/26/2020 1647   LABBENZ POSITIVE (A) 03/26/2020 1647   AMPHETMU NONE DETECTED 03/26/2020 1647   THCU NONE DETECTED 03/26/2020 1647   LABBARB NONE DETECTED 03/26/2020 1647    Alcohol Level No results found for: ETH  IMAGING  CT VENOGRAM HEAD 04/04/2020 IMPRESSION:  1. Left parieto-occipital venous infarct with increased edema and mass effect including 13 mm of rightward midline shift. New mild dilatation of the right lateral ventricle suggesting early trapping.  2. Decreased volume of nonocclusive superior sagittal sinus thrombus.  3. Decreased conspicuity of thrombus  within an adjacent left cortical vein.  4. No new venous thrombus identified.   Transthoracic Echocardiogram  03/26/20 IMPRESSIONS  1. Left ventricular ejection fraction, by estimation, is 60 to 65%. The  left ventricle has normal function. The left ventricle has no regional  wall motion abnormalities. Left ventricular diastolic parameters were  normal.  2. Right ventricular systolic function is normal. The right ventricular  size is normal. There is normal pulmonary artery systolic pressure. The  estimated right ventricular systolic pressure is 89.3 mmHg.  3. The mitral valve is normal in structure. No evidence of mitral valve  regurgitation. No evidence of mitral stenosis.  4. The aortic valve is tricuspid. Aortic valve regurgitation is not  visualized. No aortic stenosis is present.  5. The inferior vena cava is dilated in size with >50% respiratory  variability, suggesting right atrial pressure of 8 mmHg.  ECG - SR rate 76 BPM. (See cardiology reading for complete details)  EEG 03/26/20 ABNORMALITY - Continuous slow, left frontotemporal - Spikes, bilateral parieto-occipital region IMPRESSION: This study showed  evidence of epileptogenicity arising from bilateral parieto-occipital region as well as cortical dysfunction in left frontotemporal region likely secondary to underlying hemorrhage. No seizures were seen during this study.   PHYSICAL EXAM  Temp:  [98.1 F (36.7 C)-99.6 F (37.6 C)] 99.6 F (37.6 C) (10/17 0400) Pulse Rate:  [50-110] 83 (10/17 0900) Resp:  [12-22] 22 (10/17 0900) BP: (92-125)/(46-78) 123/78 (10/17 0900) SpO2:  [95 %-100 %] 100 % (10/17 0900)  General - Well nourished, well developed, in no apparent distress.  Ophthalmologic - fundi not visualized due to noncooperation.  Cardiovascular - Regular rhythm and rate.  Mental Status -  Level of arousal and orientation to month, place, and person were intact, but not to year and age. Language including expression, naming, repetition, comprehension was assessed and found intact. However, with left right confusion, and difficulty calculation in large number. Fund of Knowledge was assessed and was intact.  Cranial Nerves II - XII - II - Visual field showed right lower quadrantanopia. III, IV, VI - Extraocular movements intact. V - Facial sensation intact bilaterally. VII - Facial movement intact bilaterally. VIII - Hearing & vestibular intact bilaterally. X - Palate elevates symmetrically. XI - Chin turning & shoulder shrug intact bilaterally. XII - Tongue protrusion intact.  Motor Strength - The patient's strength was normal in all extremities and pronator drift was absent.  Bulk was normal and fasciculations were absent.   Motor Tone - Muscle tone was assessed at the neck and appendages and was normal.  Reflexes - The patient's reflexes were symmetrical in all extremities and she had no pathological reflexes.  Sensory - Light touch, temperature/pinprick were assessed and were symmetrical.    Coordination - The patient had normal movements in the hands with no ataxia or dysmetria.  Tremor was absent.  Gait and  Station - deferred.   ASSESSMENT/PLAN [name] is a 44 y.o. female with history of R parieto-occipital ICH as a result of dural sinus thrombosis (03/25/20) - discharged home from [place] yesterday on Keppra, Topamax and Coumadin presenting again to the ED with a chief complaint of headache when she woke up with intermittent worsening. Reports generalized weakness and "faintness", but has no focal deficits. She remains nauseated and has had several episodes of emesis. Pt found to have worsening vasogenic edema in L parietooccipital region and worsening midline shift of 13 mm. Warfarin stopped - Vit K 2.5 mg once -  mannitol once - Decadron, hypertonic saline, and Heparin gtt started.   Cerebral Edema  CT head Left parieto-occipital venous infarct with increased edema and mass effect including 13 mm of rightward midline shift. 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 Also thrombosis draining cortical vein. 2 areas enhancement in L occipital hemorrhage possible ongoing hemorrhage. Similar edema w/ slight R shift.  CTA and CTV 10/9 - Partial thrombosis of the superior sagittal sinus at the vertex, Thrombosed superficial draining vein on the left, Hemorrhagic infarction in the left parietal cortical and subcortical brain   Pan CT neg for malignancy, B renal vein nonocclusive thrombosis.  CTV 10/16 Decreased volume of nonocclusive superior sagittal sinus  Thrombus. Decreased conspicuity of thrombus within an adjacent left cortical vein.  2D Echo EF 60-65%. No source of embolus   LDL UTC, TC 210, TG 132, direct LDL 151.9  HgbA1c 5.6  UDS +benzos  Hypercoagulable labs unremarkable (anticardiolipin IgM slightly elevated, prot S total and activity mildly low - not accurate on heparin)  VTE prophylaxis - heparin IV    No antithrombotic prior to admission, now on heparin IV. Hold off coumadin now   Therapy recommendations:  pending   Disposition:  pending   Seizure   10/6 GTC seizure in ER   S/p ativan and keppra load  Continue keppra 500 bid  LTM EEG - L frontotemporal continuous slowing, B parieto-occipital spikes   Seizure precautions  Anemia  Hb 8.7->8.8->8.0  Low MCV and MCH  Iron pending, ferritin pending  Put on iron pills  CBC monitoring  Hyperlipidemia  Home Lipid lowering medication: Lipitor 40 mg daily  LDL LDL UTC, TC 210, TG 132, direct LDL 151.9  lipitor 40 resumed  Continue statin at discharge  OCP use  On OTC 28 for contraception   Not smoker  Recommend to avoid OCP use, especially estrogen use  Other Stroke Risk Factors    Other Active Problems  Code status - Full code  Bradycardia - 50's  Syncope 10/15 at home - could be orthostasis in the setting of anemia ? - bed rest for today  Hospital day # 1  This patient is critically ill due to cerebral edema, venous sinus thrombosis, severe anemia, history of seizure and at significant risk of neurological worsening, death form brain herniation, recurrent venous infarct, increase hematoma, status epilepticus, hypovolemic shock. This patient's care requires constant monitoring of vital signs, hemodynamics, respiratory and cardiac monitoring, review of multiple databases, neurological assessment, discussion with family, other specialists and medical decision making of high complexity. I spent 45 minutes of neurocritical care time in the  care of this patient. I had long discussion with patient and husband at bedside, updated pt current condition, treatment plan and potential prognosis, and answered all the questions.  They expressed understanding and appreciation.  I also discussed with Dr. Reatha Armour from [place].   Rosalin Hawking, MD PhD Stroke Neurology 04/05/2020 12:18 PM  To contact Stroke Continuity provider, please refer to [email or web address] After hours, contact General Neurology

## 2020-04-05 NOTE — Progress Notes (Signed)
ANTICOAGULATION CONSULT NOTE - Follow Up Consult  Pharmacy Consult for heparin Indication: dural venous sinus thrombosis  Labs: Recent Labs    04/02/20 0558 04/02/20 0558 04/03/20 0457 04/04/20 1423 04/05/20 0240  HGB 8.8*   < >  --  8.9* 8.0*  HCT 28.7*  --   --  31.0* 26.8*  PLT 427*  --   --  532* 518*  APTT  --   --   --  28  --   LABPROT 24.7*  --  22.5* 20.2*  --   INR 2.3*  --  2.1* 1.8*  --   HEPARINUNFRC 0.67  --   --   --  0.21*  CREATININE 0.68  --   --  0.74  --    < > = values in this interval not displayed.    Assessment/Plan:  44yo female subtherapeutic on heparin after starting heparin but lab was drawn <4h after gtt started; was previously therapeutic at similar rates on recent admission. Will continue gtt at current rate for now and check additional level.   Wynona Neat, PharmD, BCPS  04/05/2020,3:29 AM

## 2020-04-05 NOTE — Progress Notes (Signed)
[place] for heparin Indication: Dural venous sinus thrombosis  No Known Allergies  Patient Measurements: Height: 5' (152.4 cm) Weight: 66.2 kg (145 lb 15.1 oz) IBW/kg (Calculated) : 45.5 Heparin Dosing Weight: 59.7 kg  Vital Signs: Temp: 97.6 F (36.4 C) (10/17 1500) Temp Source: Oral (10/17 1500) BP: 122/54 (10/17 1607) Pulse Rate: 65 (10/17 1607)  Labs: Recent Labs    04/03/20 0457 04/04/20 1423 04/05/20 0240 04/05/20 0512 04/05/20 1638  HGB  --  8.9* 8.0*  --   --   HCT  --  31.0* 26.8*  --   --   PLT  --  532* 518*  --   --   APTT  --  28  --   --   --   LABPROT 22.5* 20.2*  --   --   --   INR 2.1* 1.8*  --   --   --   HEPARINUNFRC  --   --  0.21* 0.29* 0.83*  CREATININE  --  0.74  --   --   --     Estimated Creatinine Clearance: 75.4 mL/min (by C-G formula based on SCr of 0.74 mg/dL).   Medical History: Past Medical History:  Diagnosis Date  . 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 INR 1.8 on admission, now s/p 2.5mg  vitamin K per MD to bring INR down some and to start heparin gtt in meantime for potential surgery.  Heparin level supratherapeutic, confirmed with 2nd heparin level STAT on 800 units/hr.    Goal of Therapy:  Heparin level 0.3-0.5 units/ml Monitor platelets by anticoagulation protocol: Yes   Plan:  Decrease heparin gtt to 700 units/hr F/u 6 hour heparin level  Bertis Ruddy, PharmD Clinical Pharmacist ED Pharmacist Phone # [phone] 04/05/2020 7:37 PM

## 2020-04-05 NOTE — Progress Notes (Signed)
PT Cancellation Note  Patient Details Name: Erica Rosario MRN: [id number] DOB: [birth year]   Cancelled Treatment:    Reason Eval/Treat Not Completed: Patient not medically ready   Discussed with Dr. Erlinda Hong and recommended continued bedrest due to recent syncope and current anemia. Plans to check orthostatics 10/18 and likely can participate with PT at that time.    Arby Barrette, PT Pager [phone]    Rexanne Mano 04/05/2020, 3:35 PM

## 2020-04-05 NOTE — Progress Notes (Signed)
ANTICOAGULATION CONSULT NOTE  Pharmacy Consult for heparin Indication: Dural venous sinus thrombosis  No Known Allergies  Patient Measurements:   Heparin Dosing Weight: 59.7 kg  Vital Signs: Temp: 99.6 F (37.6 C) (10/17 0400) Temp Source: Oral (10/17 0700) BP: 114/63 (10/17 1000) Pulse Rate: 65 (10/17 1000)  Labs: Recent Labs    04/03/20 0457 04/04/20 1423 04/05/20 0240 04/05/20 0512  HGB  --  8.9* 8.0*  --   HCT  --  31.0* 26.8*  --   PLT  --  532* 518*  --   APTT  --  28  --   --   LABPROT 22.5* 20.2*  --   --   INR 2.1* 1.8*  --   --   HEPARINUNFRC  --   --  0.21* 0.29*  CREATININE  --  0.74  --   --     Estimated Creatinine Clearance: 75.4 mL/min (by C-G formula based on SCr of 0.74 mg/dL).   Medical History: Past Medical History:  Diagnosis Date  . 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 INR 1.8 on admission, now s/p 2.5mg  vitamin K per MD to bring INR down some and to start heparin gtt in meantime for potential surgery.  Heparin level very slightly subtherapeutic at 0.29. Hg down to 8, plt 518 stable. No bleeding or issues with infusion per discussion with RN.  Goal of Therapy:  Heparin level 0.3-0.5 units/ml Monitor platelets by anticoagulation protocol: Yes   Plan:  Increase heparin to 800 units/hr Check 6hr heparin level Monitor daily heparin level and CBC, s/sx bleeding   Arturo Morton, PharmD, BCPS Please check AMION for all [place] contact numbers Clinical Pharmacist 04/05/2020 10:57 AM

## 2020-04-06 ENCOUNTER — Inpatient Hospital Stay (HOSPITAL_COMMUNITY): Payer: Medicaid Other

## 2020-04-06 ENCOUNTER — Inpatient Hospital Stay: Payer: Self-pay

## 2020-04-06 LAB — URINALYSIS, COMPLETE (UACMP) WITH MICROSCOPIC
Bacteria, UA: NONE SEEN
Bilirubin Urine: NEGATIVE
Glucose, UA: NEGATIVE mg/dL
Hgb urine dipstick: NEGATIVE
Ketones, ur: NEGATIVE mg/dL
Leukocytes,Ua: NEGATIVE
Nitrite: NEGATIVE
Protein, ur: NEGATIVE mg/dL
Specific Gravity, Urine: 1.003 — ABNORMAL LOW (ref 1.005–1.030)
pH: 7 (ref 5.0–8.0)

## 2020-04-06 LAB — BASIC METABOLIC PANEL
Anion gap: 9 (ref 5–15)
BUN: 5 mg/dL — ABNORMAL LOW (ref 6–20)
CO2: 17 mmol/L — ABNORMAL LOW (ref 22–32)
Calcium: 7.9 mg/dL — ABNORMAL LOW (ref 8.9–10.3)
Chloride: 117 mmol/L — ABNORMAL HIGH (ref 98–111)
Creatinine, Ser: 0.62 mg/dL (ref 0.44–1.00)
GFR, Estimated: >60 mL/min (ref >60)
Glucose, Bld: 114 mg/dL — ABNORMAL HIGH (ref 70–99)
Potassium: 3.3 mmol/L — ABNORMAL LOW (ref 3.5–5.1)
Sodium: 143 mmol/L (ref 135–145)

## 2020-04-06 LAB — CBC
HCT: 24.7 % — ABNORMAL LOW (ref 36.0–46.0)
Hemoglobin: 7.2 g/dL — ABNORMAL LOW (ref 12.0–15.0)
MCH: 21.8 pg — ABNORMAL LOW (ref 26.0–34.0)
MCHC: 29.1 g/dL — ABNORMAL LOW (ref 30.0–36.0)
MCV: 74.8 fL — ABNORMAL LOW (ref 80.0–100.0)
Platelets: 462 10*3/uL — ABNORMAL HIGH (ref 150–400)
RBC: 3.3 MIL/uL — ABNORMAL LOW (ref 3.87–5.11)
RDW: 18.5 % — ABNORMAL HIGH (ref 11.5–15.5)
WBC: 11.4 10*3/uL — ABNORMAL HIGH (ref 4.0–10.5)
nRBC: 0.6 % — ABNORMAL HIGH (ref 0.0–0.2)

## 2020-04-06 LAB — SODIUM
Sodium: 141 mmol/L (ref 135–145)
Sodium: 144 mmol/L (ref 135–145)
Sodium: 142 mmol/L (ref 135–145)

## 2020-04-06 LAB — HEPATIC FUNCTION PANEL
ALT: 241 U/L — ABNORMAL HIGH (ref 0–44)
AST: 150 U/L — ABNORMAL HIGH (ref 15–41)
Albumin: 3 g/dL — ABNORMAL LOW (ref 3.5–5.0)
Alkaline Phosphatase: 84 U/L (ref 38–126)
Bilirubin, Direct: <0.1 mg/dL (ref 0.0–0.2)
Total Bilirubin: 0.2 mg/dL — ABNORMAL LOW (ref 0.3–1.2)
Total Protein: 6.2 g/dL — ABNORMAL LOW (ref 6.5–8.1)

## 2020-04-06 LAB — PREPARE RBC (CROSSMATCH)

## 2020-04-06 LAB — ABO/RH: ABO/RH(D): B POS

## 2020-04-06 LAB — HEPARIN LEVEL (UNFRACTIONATED)
Heparin Unfractionated: 0.82 IU/mL — ABNORMAL HIGH (ref 0.30–0.70)
Heparin Unfractionated: 0.8 IU/mL — ABNORMAL HIGH (ref 0.30–0.70)
Heparin Unfractionated: 0.28 IU/mL — ABNORMAL LOW (ref 0.30–0.70)

## 2020-04-06 MED ORDER — SODIUM CHLORIDE 0.9% FLUSH
10.0000 mL | INTRAVENOUS | Status: DC | PRN
  Administered 2020-04-06: 10 mL

## 2020-04-06 MED ORDER — SODIUM CHLORIDE 0.9% FLUSH
10.0000 mL | Freq: Two times a day (BID) | INTRAVENOUS | Status: DC
  Administered 2020-04-07 – 2020-04-11 (×7): 10 mL

## 2020-04-06 MED ORDER — SODIUM CHLORIDE 0.9% IV SOLUTION: Freq: Once | INTRAVENOUS | Status: AC

## 2020-04-06 MED ORDER — HYDRALAZINE HCL 20 MG/ML IJ SOLN
INTRAMUSCULAR | Status: AC
  Filled 2020-04-06: qty 1

## 2020-04-06 MED ORDER — HYDRALAZINE HCL 20 MG/ML IJ SOLN
5.0000 mg | Freq: Once | INTRAMUSCULAR | Status: AC
  Administered 2020-04-06: 5 mg via INTRAVENOUS

## 2020-04-06 MED ORDER — POTASSIUM CHLORIDE CRYS ER 20 MEQ PO TBCR
40.0000 meq | EXTENDED_RELEASE_TABLET | Freq: Once | ORAL | Status: AC
  Administered 2020-04-06: 40 meq via ORAL
  Filled 2020-04-06: qty 2

## 2020-04-06 MED ORDER — SODIUM CHLORIDE 0.9 % IV SOLN
1.0000 g | INTRAVENOUS | Status: DC
  Administered 2020-04-06 – 2020-04-07 (×2): 1 g via INTRAVENOUS
  Filled 2020-04-06 (×2): qty 1

## 2020-04-06 NOTE — Progress Notes (Signed)
ANTICOAGULATION CONSULT NOTE  Pharmacy Consult for heparin Indication: Dural venous sinus thrombosis  No Known Allergies  Patient Measurements: Height: 5' (152.4 cm) Weight: 66.2 kg (145 lb 15.1 oz) IBW/kg (Calculated) : 45.5 Heparin Dosing Weight: 59.7 kg  Vital Signs: Temp: 101.7 F (38.7 C) (10/18 0800) Temp Source: Oral (10/18 0800) BP: 130/80 (10/18 0800) Pulse Rate: 56 (10/18 0800)  Labs: Recent Labs    04/04/20 1423 04/04/20 1423 04/05/20 0240 04/05/20 0512 04/05/20 1834 04/06/20 0127 04/06/20 0735  HGB 8.9*   < > 8.0*  --   --   --  7.2*  HCT 31.0*  --  26.8*  --   --   --  24.7*  PLT 532*  --  518*  --   --   --  462*  APTT 28  --   --   --   --   --   --   LABPROT 20.2*  --   --   --   --   --   --   INR 1.8*  --   --   --   --   --   --   HEPARINUNFRC  --   --  0.21*   < > 0.75* 0.82* 0.80*  CREATININE 0.74  --   --   --   --   --  0.62   < > = values in this interval not displayed.    Estimated Creatinine Clearance: 75.4 mL/min (by C-G formula based on SCr of 0.62 mg/dL).   Medical History: Past Medical History:  Diagnosis Date  . 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 INR 1.8 on admission, now s/p 2.5mg  vitamin K per MD to bring INR down some and to start heparin gtt in meantime for potential surgery.  Heparin level remains supratherapeutic at 0.8 on 500 units/hr of IV heparin. No s/s of overt bleeding  Discussed case with RN. IV heparin is running through left forearm and hypertonic saline is running through L AC. RN reminded phlebotomy to pause heparin infusion, flush line and draw appropriately. While it would be ideal to run IV heparin in [facility] and draw from wrist, it would be suboptimal and possibly unsafe to run hypertonic saline through the forearm.   Goal of Therapy:  Heparin level 0.3-0.5 units/ml Monitor platelets by anticoagulation protocol: Yes    Plan:  Decrease IV heparin to 400 units/hr  Check 6hr heparin level Monitor daily heparin level and CBC, s/sx bleeding   [name], PharmD., BCPS, BCCCP Clinical Pharmacist Please refer to [facility] for unit-specific pharmacist

## 2020-04-06 NOTE — Progress Notes (Signed)
ANTICOAGULATION CONSULT NOTE - Follow Up Consult  Pharmacy Consult for heparin Indication: dural venous sinus thrombosis  Labs: Recent Labs    04/03/20 0457 04/04/20 1423 04/05/20 0240 04/05/20 0512 04/05/20 1638 04/05/20 1834 04/06/20 0127  HGB  --  8.9* 8.0*  --   --   --   --   HCT  --  31.0* 26.8*  --   --   --   --   PLT  --  532* 518*  --   --   --   --   APTT  --  28  --   --   --   --   --   LABPROT 22.5* 20.2*  --   --   --   --   --   INR 2.1* 1.8*  --   --   --   --   --   HEPARINUNFRC  --   --  0.21*   < > 0.83* 0.75* 0.82*  CREATININE  --  0.74  --   --   --   --   --    < > = values in this interval not displayed.    Assessment: 44yo female supratherapeutic on heparin despite rate decrease to where pt was previously therapeutic; no gtt issues or signs of bleeding per RN.  Goal of Therapy:  Heparin level 0.3-0.5 units/ml   Plan:  Will decrease heparin gtt by 3 units/kg/hr to 500 units/hr and check level in 6 hours.    Wynona Neat, PharmD, BCPS  04/06/2020,3:03 AM

## 2020-04-06 NOTE — Progress Notes (Signed)
PT Cancellation Note  Patient Details Name: Erica Rosario MRN: [id number] DOB: February 25, 1976   Cancelled Treatment:    Reason Eval/Treat Not Completed: Patient not medically ready Holding PT evaluation per Dr. Erlinda Hong as pt continues to have drop in hemoglobin and fall risk due to episodes of syncope. Wants to await to see her hopefully post blood transfusion. Will follow.   Marguarite Arbour A Josedejesus Marcum 04/06/2020, 9:58 AM Marisa Severin, PT, DPT Acute Rehabilitation Services Pager [phone] Office [phone]

## 2020-04-06 NOTE — Progress Notes (Signed)
OT Cancellation Note  Patient Details Name: Erica Rosario MRN: [id number] DOB: 1976-03-17   Cancelled Treatment:    Reason Eval/Treat Not Completed: Active bedrest order/Pt not medically ready per Dr. Erlinda Hong as pt continues to have drop in hemoglobin and fall risk due to episodes of syncope. Wants to await to see her hopefully post blood transfusion. Will follow.  [place] 04/06/2020, 10:05 AM   Jesse Sans OTR/L Acute Rehabilitation Services Pager: [phone] Office: [phone]

## 2020-04-06 NOTE — Progress Notes (Addendum)
Brief overnight progress note  Patient became hypertensive after transfusion completed, to the 160s, with worsening headache and generalized weakness. She denies any other complaints including nausea, reports headache improves with sitting up. Also recently started on topamax (25 mg 10/16 PM, 50 mg BID 10/17)   Current Facility-Administered Medications:  .  acetaminophen (TYLENOL) tablet 650 mg, 650 mg, Oral, Q4H PRN, 650 mg at 04/06/20 2217 **OR** acetaminophen (TYLENOL) 160 MG/5ML solution 650 mg, 650 mg, Per Tube, Q4H PRN **OR** acetaminophen (TYLENOL) suppository 650 mg, 650 mg, Rectal, Q4H PRN, Donnetta Simpers, MD .  atorvastatin (LIPITOR) tablet 40 mg, 40 mg, Oral, Daily, Rosalin Hawking, MD, 40 mg at 04/06/20 0911 .  cefTRIAXone (ROCEPHIN) 1 g in sodium chloride 0.9 % 100 mL IVPB, 1 g, Intravenous, Q24H, Rosalin Hawking, MD, Stopped at 04/06/20 1400 .  chlorhexidine (PERIDEX) 0.12 % solution 15 mL, 15 mL, Mouth Rinse, BID, Dawley, Troy C, DO, 15 mL at 04/06/20 2102 .  Chlorhexidine Gluconate Cloth 2 % PADS 6 each, 6 each, Topical, Daily, Dawley, Troy C, DO, 6 each at 04/05/20 1736 .  dexamethasone (DECADRON) injection 4 mg, 4 mg, Intravenous, Q6H, Donnetta Simpers, MD, 4 mg at 04/06/20 2100 .  ferrous sulfate tablet 325 mg, 325 mg, Oral, TID WC, Rosalin Hawking, MD, 325 mg at 04/06/20 1900 .  heparin ADULT infusion 100 units/mL (25000 units/233mL sodium chloride 0.45%), 400 Units/hr, Intravenous, Continuous, Mancheril, Darnell Level, RPH, Last Rate: 4 mL/hr at 04/06/20 2000, 400 Units/hr at 04/06/20 2000 .  hydrALAZINE (APRESOLINE) 20 MG/ML injection, , , ,  .  levETIRAcetam (KEPPRA) tablet 500 mg, 500 mg, Oral, BID, Donnetta Simpers, MD, 500 mg at 04/06/20 2102 .  ondansetron (ZOFRAN) injection 4 mg, 4 mg, Intravenous, Q6H PRN, Rosalin Hawking, MD, 4 mg at 04/05/20 1434 .  pantoprazole (PROTONIX) EC tablet 40 mg, 40 mg, Oral, Daily, Rosalin Hawking, MD, 40 mg at 04/06/20 0911 .  senna-docusate (Senokot-S)  tablet 1 tablet, 1 tablet, Oral, BID, Donnetta Simpers, MD, 1 tablet at 04/06/20 2102 .  sodium chloride (hypertonic) 3 % solution, , Intravenous, Continuous, Donnetta Simpers, MD, Last Rate: 75 mL/hr at 04/06/20 2022, New Bag at 04/06/20 2022 .  sodium chloride flush (NS) 0.9 % injection 10-40 mL, 10-40 mL, Intracatheter, Q12H, Rosalin Hawking, MD .  sodium chloride flush (NS) 0.9 % injection 10-40 mL, 10-40 mL, Intracatheter, PRN, Rosalin Hawking, MD, 10 mL at 04/06/20 2104 .  topiramate (TOPAMAX) tablet 50 mg, 50 mg, Oral, BID, Rosalin Hawking, MD, 50 mg at 04/06/20 2103  Vitals with BMI 04/06/2020 04/06/2020 04/06/2020  Height - - -  Weight - - -  BMI - - -  Systolic 761 607 371  Diastolic 74 78 69  Pulse 40 40 43   Exam stable from previously described, mild confusion (thumb = finger, oriented to month but confused about when her anniversary is, following simple and multistep commands), CN intact, motor: mild right pronator drift (PICC line in that arm), legs symmetrically antigravity, Lungs clear bilaterally throughout  Labs:   K 3.3 in the morning   Possible mild transfusion reaction, potential for worsening ICH/edema/hydrocephalus ruled out by HCT. Heparin paused briefly for scan, then restarted given stable scan. Bradycardia is baseline, will avoid labetalol. Notably headache improved with BP control with 5 mg hydralazine IV; will continue this overnight with additional BP control per stroke team in the morning.   Recs: # Headache, fluctuating, in the setting of known CVST c/b ICH on heparin ggt - STAT  HCT, personally reviewed, as above  - 5 mg IV hydralazine x 1 dose, then PRN 5 mg q4hr for up to 3 doses  - CMP and Mg in addition to CBC (to be drawn in the morning); note not yet at goal sodium but no urgent need increase overnight given stability of scan - 2 gram Magnesium to help headache - Continue prior therapies (heparin, Topamax, dexamethasone, tylenol)  # Potential for  transfusion reaction > Exam reassuring with clear lungs - CTM oxygenation - CXR  - pro-BNP  Lesleigh Noe MD-PhD [facility] [phone]   - No charge

## 2020-04-06 NOTE — Progress Notes (Signed)
Peripherally Inserted Central Catheter Placement  The IV Nurse has discussed with the patient and/or persons authorized to consent for the patient, the purpose of this procedure and the potential benefits and risks involved with this procedure.  The benefits include less needle sticks, lab draws from the catheter, and the patient may be discharged home with the catheter. Risks include, but not limited to, infection, bleeding, blood clot (thrombus formation), and puncture of an artery; nerve damage and irregular heartbeat and possibility to perform a PICC exchange if needed/ordered by physician.  Alternatives to this procedure were also discussed.  Bard Power PICC patient education guide, fact sheet on infection prevention and patient information card has been provided to patient /or left at bedside.    PICC Placement Documentation  PICC Triple Lumen 28/63/81 PICC Right Basilic 37 cm 0 cm (Active)  Indication for Insertion or Continuance of Line Administration of hyperosmolar/irritating solutions (i.e. TPN, Vancomycin, etc.) 04/06/20 1718  Exposed Catheter (cm) 0 cm 04/06/20 1718  Site Assessment Clean;Dry;Intact 04/06/20 1718  Lumen #1 Status Flushed;Blood return noted;Saline locked 04/06/20 1718  Lumen #2 Status Flushed;Blood return noted;Saline locked 04/06/20 1718  Lumen #3 Status Flushed;Blood return noted;Saline locked 04/06/20 1718  Dressing Type Transparent 04/06/20 1718  Dressing Status Clean;Dry;Intact 04/06/20 1718  Antimicrobial disc in place? Yes 04/06/20 1718  Dressing Change Due 04/13/20 04/06/20 1718       Scotty Court 04/06/2020, 5:21 PM

## 2020-04-06 NOTE — Progress Notes (Signed)
STROKE TEAM PROGRESS NOTE   INTERVAL HISTORY RN and husband at bedside.  Spanish interpreter via bedside app.  Patient stated that she had a fever this morning, at same time she felt burning pain on urination.  T-max 101.7, patient did stated that she has frequent UTI in the past.  Sodium 143.  Mild headache, responding to Tylenol.  Hemoglobin 7.2, will give blood transfusion.  OBJECTIVE Vitals:   04/06/20 0500 04/06/20 0600 04/06/20 0700 04/06/20 0800  BP: 122/75 (!) 122/58 123/72 130/80  Pulse: 76 (!) 44 (!) 49 (!) 56  Resp: 18 16 18  (!) 24  Temp:    (!) 101.7 F (38.7 C)  TempSrc:    Oral  SpO2: 100% 100% 100% 100%  Weight:      Height:       CBC:  Recent Labs  Lab 04/01/20 0651 04/02/20 0558 04/04/20 1423 04/04/20 1423 04/05/20 0240 04/06/20 0735  WBC 6.6   < > 7.7   < > 5.5 11.4*  NEUTROABS 3.4  --  5.8  --   --   --   HGB 8.7*   < > 8.9*   < > 8.0* 7.2*  HCT 29.4*   < > 31.0*   < > 26.8* 24.7*  MCV 73.0*   < > 76.2*   < > 72.4* 74.8*  PLT 429*   < > 532*   < > 518* 462*   < > = values in this interval not displayed.   Basic Metabolic Panel:  Recent Labs  Lab 04/04/20 1423 04/04/20 2114 04/06/20 0127 04/06/20 0735  NA 135   < > 141 143  K 3.7  --   --  3.3*  CL 102  --   --  117*  CO2 20*  --   --  17*  GLUCOSE 112*  --   --  114*  BUN 11  --   --  5*  CREATININE 0.74  --   --  0.62  CALCIUM 8.8*  --   --  7.9*   < > = values in this interval not displayed.   Lipid Panel:     Component Value Date/Time   CHOL 210 (H) 03/26/2020 1010   TRIG 132 03/26/2020 1010   HDL 53 03/26/2020 1010   CHOLHDL 4.0 03/26/2020 1010   VLDL 26 03/26/2020 1010   LDLCALC NOT CALCULATED 03/26/2020 1010   HgbA1c:  Lab Results  Component Value Date   HGBA1C 5.6 03/26/2020    IMAGING No results found.    PHYSICAL EXAM    Temp:  [97.6 F (36.4 C)-101.7 F (38.7 C)] 101.7 F (38.7 C) (10/18 0800) Pulse Rate:  [44-84] 56 (10/18 0800) Resp:  [15-24] 24 (10/18  0800) BP: (108-130)/(52-86) 130/80 (10/18 0800) SpO2:  [100 %] 100 % (10/18 0800) Weight:  [66.2 kg] 66.2 kg (10/17 1000)  General - Well nourished, well developed, in no apparent distress.  Ophthalmologic - fundi not visualized due to noncooperation.  Cardiovascular - Regular rhythm and rate.  Mental Status -  Level of arousal and orientation to month, place, and person were intact, but not to year and age. Language including expression, naming, repetition, comprehension was assessed and found intact. However, still with left right confusion. Fund of Knowledge was assessed and was intact.  Cranial Nerves II - XII - II - Visual field showed right lower quadrantanopia. III, IV, VI - Extraocular movements intact. V - Facial sensation intact bilaterally. VII - Facial movement intact  bilaterally. VIII - Hearing & vestibular intact bilaterally. X - Palate elevates symmetrically. XI - Chin turning & shoulder shrug intact bilaterally. XII - Tongue protrusion intact.  Motor Strength - The patient's strength was normal in all extremities and pronator drift was absent.  Bulk was normal and fasciculations were absent.   Motor Tone - Muscle tone was assessed at the neck and appendages and was normal.  Reflexes - The patient's reflexes were symmetrical in all extremities and she had no pathological reflexes.  Sensory - Light touch, temperature/pinprick were assessed and were symmetrical.    Coordination - The patient had normal movements in the hands with no ataxia or dysmetria.  Tremor was absent.  Gait and Station - deferred.   ASSESSMENT/PLAN Erica Rosario is a 44 y.o. 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 [facility] yesterday (04/03/20) on Keppra, Topamax and Coumadin presenting again to the ED with a chief complaint of headache when she woke up with intermittent worsening.  Reports generalized weakness and "faintness", but has no focal deficits. She remains nauseated and has had several episodes of emesis. Pt found to have worsening vasogenic edema in L parietooccipital region and worsening midline shift of 13 mm. Warfarin stopped - Vit K 2.5 mg once - mannitol once - Decadron, hypertonic saline, and Heparin gtt started.   Cerebral Edema  CT head Left parieto-occipital venous infarct with increased edema and mass effect including 13 mm of rightward midline shift. New mild dilatation of the right lateral ventricle suggesting early trapping  Repeat CT in a.m.  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 Also thrombosis draining cortical vein. 2 areas enhancement in L occipital hemorrhage possible ongoing hemorrhage. Similar edema w/ slight R shift.  CTA and CTV 10/9 - Partial thrombosis of the superior sagittal sinus at the vertex, Thrombosed superficial draining vein on the left, Hemorrhagic infarction in the left parietal cortical and subcortical brain   Pan CT neg for malignancy, B renal vein nonocclusive thrombosis.  CTV 10/16 Decreased volume of nonocclusive superior sagittal sinus Thrombus. Decreased conspicuity of thrombus within an adjacent left cortical vein.  Repeat CT head am pending    2D Echo EF 60-65%. No source of embolus   LDL UTC, TC 210, TG 132, direct LDL 151.9  HgbA1c 5.6  UDS +benzos  Hypercoagulable labs unremarkable (anticardiolipin IgM slightly  elevated, prot S total and activity mildly low - not accurate on heparin)  VTE prophylaxis - heparin IV    No antithrombotic prior to admission, now on heparin IV. Hold off coumadin now   Therapy recommendations:  pending - keep in bed today  Disposition:  pending   Seizure   10/6 GTC seizure in ER   S/p ativan and keppra load  Continue keppra 500 bid  LTM EEG - L frontotemporal continuous slowing, B parieto-occipital spikes   Seizure precautions  Severe anemia  Hb  8.7->8.8->8.0->7.2-> 2U PRBC -> pending   Low MCV and MCH  Iron 13(L), TIBC 552 (h), ferritin 10 (L)  On iron pills  Transfuse 2u   Stool guiac pending   CBC monitoring  Leukocytosis  WBC 7.7->5.5->11.4  CXR negative  UA neg, UCx pending   Blood Cx 10/18 pending   Rocephin 10/18>>  Elevated Liver Enzynes  AST/ ALT 150/262->111/229->76/182->150/241->pending  CT abd/pelvis last admission w/ fatty liver   Hyperlipidemia  Home Lipid lowering medication: Lipitor 40 mg daily  LDL LDL UTC, TC 210, TG 132, direct LDL 151.9  lipitor 40 resumed  Continue statin at discharge  OCP use  On OTC 28 for contraception   Not smoker  Recommend to avoid OCP use, especially estrogen use  Other Stroke Risk Factors    Other Active Problems  Code status - Full code  Bradycardia - 50's  Syncope 10/15 at home - could be orthostasis in the setting of anemia  - continue bed rest for today  Thrombocytosis PLT 532->518->462  Hypokalemia K 3.7->3.3 - supplement - recheck am   Hospital day # 2  This patient is critically ill due to severe anemia, fever and leukocytosis, cerebral edema, seizure, cerebral venous thrombosis and at significant risk of neurological worsening, death form sepsis, hypovolemic shock, brain herniation, status epilepticus, hydrocephalus. This patient's care requires constant monitoring of vital signs, hemodynamics, respiratory and cardiac monitoring, review of multiple  databases, neurological assessment, discussion with family, other specialists and medical decision making of high complexity. I spent 40 minutes of neurocritical care time in the care of this patient. I had long discussion with patient and her husband at bedside, updated pt current condition, treatment plan and potential prognosis, and answered all the questions.  They expressed understanding and appreciation.    Erica Hawking, MD PhD Stroke Neurology 04/06/2020 9:17 AM   To contact Stroke Continuity provider, please refer to [email or web address] After hours, contact General Neurology

## 2020-04-07 ENCOUNTER — Inpatient Hospital Stay (HOSPITAL_COMMUNITY): Payer: Medicaid Other

## 2020-04-07 DIAGNOSIS — R609 Edema, unspecified: Secondary | ICD-10-CM

## 2020-04-07 LAB — URINE CULTURE: Culture: <10000 — AB

## 2020-04-07 LAB — COMPREHENSIVE METABOLIC PANEL
ALT: 300 U/L — ABNORMAL HIGH (ref 0–44)
AST: 189 U/L — ABNORMAL HIGH (ref 15–41)
Albumin: 3 g/dL — ABNORMAL LOW (ref 3.5–5.0)
Alkaline Phosphatase: 86 U/L (ref 38–126)
Anion gap: 9 (ref 5–15)
BUN: 7 mg/dL (ref 6–20)
CO2: 15 mmol/L — ABNORMAL LOW (ref 22–32)
Calcium: 7.9 mg/dL — ABNORMAL LOW (ref 8.9–10.3)
Chloride: 119 mmol/L — ABNORMAL HIGH (ref 98–111)
Creatinine, Ser: 0.62 mg/dL (ref 0.44–1.00)
GFR, Estimated: >60 mL/min (ref >60)
Glucose, Bld: 132 mg/dL — ABNORMAL HIGH (ref 70–99)
Potassium: 3.4 mmol/L — ABNORMAL LOW (ref 3.5–5.1)
Sodium: 143 mmol/L (ref 135–145)
Total Bilirubin: 1 mg/dL (ref 0.3–1.2)
Total Protein: 6 g/dL — ABNORMAL LOW (ref 6.5–8.1)

## 2020-04-07 LAB — OCCULT BLOOD X 1 CARD TO LAB, STOOL: Fecal Occult Bld: NEGATIVE

## 2020-04-07 LAB — CBC
HCT: 34.1 % — ABNORMAL LOW (ref 36.0–46.0)
Hemoglobin: 10.7 g/dL — ABNORMAL LOW (ref 12.0–15.0)
MCH: 24.5 pg — ABNORMAL LOW (ref 26.0–34.0)
MCHC: 31.4 g/dL (ref 30.0–36.0)
MCV: 78.2 fL — ABNORMAL LOW (ref 80.0–100.0)
Platelets: 422 10*3/uL — ABNORMAL HIGH (ref 150–400)
RBC: 4.36 MIL/uL (ref 3.87–5.11)
RDW: 18.4 % — ABNORMAL HIGH (ref 11.5–15.5)
WBC: 13.1 10*3/uL — ABNORMAL HIGH (ref 4.0–10.5)
nRBC: 2.6 % — ABNORMAL HIGH (ref 0.0–0.2)

## 2020-04-07 LAB — SODIUM
Sodium: 143 mmol/L (ref 135–145)
Sodium: 142 mmol/L (ref 135–145)
Sodium: 137 mmol/L (ref 135–145)

## 2020-04-07 LAB — TYPE AND SCREEN
ABO/RH(D): B POS
Antibody Screen: NEGATIVE
Unit division: 0
Unit division: 0

## 2020-04-07 LAB — BPAM RBC
Blood Product Expiration Date: 202111102359
Blood Product Expiration Date: 202111102359
ISSUE DATE / TIME: 202110181741
ISSUE DATE / TIME: 202110182008
Unit Type and Rh: 7300
Unit Type and Rh: 7300

## 2020-04-07 LAB — HEPATIC FUNCTION PANEL
ALT: 283 U/L — ABNORMAL HIGH (ref 0–44)
AST: 144 U/L — ABNORMAL HIGH (ref 15–41)
Albumin: 3 g/dL — ABNORMAL LOW (ref 3.5–5.0)
Alkaline Phosphatase: 87 U/L (ref 38–126)
Bilirubin, Direct: <0.1 mg/dL (ref 0.0–0.2)
Total Bilirubin: 0.7 mg/dL (ref 0.3–1.2)
Total Protein: 6.1 g/dL — ABNORMAL LOW (ref 6.5–8.1)

## 2020-04-07 LAB — MAGNESIUM: Magnesium: 2.1 mg/dL (ref 1.7–2.4)

## 2020-04-07 LAB — HEPARIN LEVEL (UNFRACTIONATED): Heparin Unfractionated: 0.28 IU/mL — ABNORMAL LOW (ref 0.30–0.70)

## 2020-04-07 LAB — BRAIN NATRIURETIC PEPTIDE: B Natriuretic Peptide: 196.9 pg/mL — ABNORMAL HIGH (ref 0.0–100.0)

## 2020-04-07 MED ORDER — SODIUM CHLORIDE 0.9 % IV SOLN
1.0000 g | INTRAVENOUS | Status: AC
  Administered 2020-04-08: 1 g via INTRAVENOUS
  Filled 2020-04-07: qty 10

## 2020-04-07 MED ORDER — HYDRALAZINE HCL 20 MG/ML IJ SOLN
5.0000 mg | INTRAMUSCULAR | Status: DC | PRN
  Filled 2020-04-07 (×2): qty 1

## 2020-04-07 MED ORDER — ENOXAPARIN SODIUM 80 MG/0.8ML ~~LOC~~ SOLN
1.0000 mg/kg | Freq: Two times a day (BID) | SUBCUTANEOUS | Status: DC
  Administered 2020-04-07 – 2020-04-11 (×9): 65 mg via SUBCUTANEOUS
  Filled 2020-04-07 (×9): qty 0.8

## 2020-04-07 MED ORDER — DEXAMETHASONE SODIUM PHOSPHATE 4 MG/ML IJ SOLN
2.0000 mg | Freq: Four times a day (QID) | INTRAMUSCULAR | Status: DC
  Administered 2020-04-07 – 2020-04-08 (×4): 2 mg via INTRAVENOUS
  Filled 2020-04-07 (×4): qty 1

## 2020-04-07 MED ORDER — HYDRALAZINE HCL 20 MG/ML IJ SOLN: 5.0000 mg | INTRAMUSCULAR | Status: DC | PRN

## 2020-04-07 MED ORDER — MAGNESIUM SULFATE 2 GM/50ML IV SOLN
2.0000 g | Freq: Once | INTRAVENOUS | Status: AC
  Administered 2020-04-07: 2 g via INTRAVENOUS
  Filled 2020-04-07: qty 50

## 2020-04-07 NOTE — Progress Notes (Signed)
STROKE TEAM PROGRESS NOTE   INTERVAL HISTORY Husband at the bedside. Pt sitting in bed, complains of SOB over night. She stated that she felt she had to have deep breath all the time. O2 sat 100%. HR 40-50s overnight. No fever. DVT neg. CT repeat showed decreased MLS, edema and hydrocephalus. will taper off decadron, continue 3% for now. Her BMP showed CO2 at 15, concerning for metabolic acidosis, which could explain her SOB/hyperventilation. Will d/c topamax.   OBJECTIVE Vitals:   04/07/20 0830 04/07/20 0845 04/07/20 0900 04/07/20 1000  BP: (!) 143/71 (!) 143/76 134/69 128/72  Pulse:  (!) 48 (!) 46 (!) 45  Resp: 17 (!) 24 18 17   Temp:      TempSrc:      SpO2: 99% 100% 100% 100%  Weight:      Height:       CBC:  Recent Labs  Lab 04/01/20 0651 04/02/20 0558 04/04/20 1423 04/05/20 0240 04/06/20 0735 04/07/20 0602  WBC 6.6   < > 7.7   < > 11.4* 13.1*  NEUTROABS 3.4  --  5.8  --   --   --   HGB 8.7*   < > 8.9*   < > 7.2* 10.7*  HCT 29.4*   < > 31.0*   < > 24.7* 34.1*  MCV 73.0*   < > 76.2*   < > 74.8* 78.2*  PLT 429*   < > 532*   < > 462* 422*   < > = values in this interval not displayed.   Basic Metabolic Panel:  Recent Labs  Lab 04/06/20 0735 04/06/20 1028 04/06/20 2126 04/07/20 0055  NA 143   < > 142 143  K 3.3*  --   --  3.4*  CL 117*  --   --  119*  CO2 17*  --   --  15*  GLUCOSE 114*  --   --  132*  BUN 5*  --   --  7  CREATININE 0.62  --   --  0.62  CALCIUM 7.9*  --   --  7.9*  MG  --   --   --  2.1   < > = values in this interval not displayed.   Lipid Panel:     Component Value Date/Time   CHOL 210 (H) 03/26/2020 1010   TRIG 132 03/26/2020 1010   HDL 53 03/26/2020 1010   CHOLHDL 4.0 03/26/2020 1010   VLDL 26 03/26/2020 1010   LDLCALC NOT CALCULATED 03/26/2020 1010   HgbA1c:  Lab Results  Component Value Date   HGBA1C 5.6 03/26/2020    IMAGING CT HEAD WO CONTRAST  Result Date: 04/07/2020 CLINICAL DATA:  44 year old female status post venous  infarct of the posterior left hemisphere earlier this month. EXAM: CT HEAD WITHOUT CONTRAST TECHNIQUE: Contiguous axial images were obtained from the base of the skull through the vertex without intravenous contrast. COMPARISON:  Head CT, repeat CT V 04/04/2020. Earlier head CTs 03/28/2020 and prior. FINDINGS: Brain: Confluent hypodensity in the posterior and superior left hemisphere most resembling vasogenic edema is stable since 04/04/2020, although progressed from earlier studies as noted at that time. Areas of discrete hyperdense hemorrhage continue to fade in the posterior left hemisphere (series 3, image 22 today). No increased blood products. Stable associated intracranial mass effect with rightward midline shift of 9-10 mm. Stable mass effect on the ventricles and mild right lateral ventriculomegaly, although the temporal horn remains relatively decompressed. Basilar cisterns are stable,  and on sagittal image 30 today suprasellar cistern patency has mildly improved since 04/04/2020. Stable gray-white matter differentiation elsewhere. No extra-axial or new intracranial hemorrhage. Vascular: Stable intracranial vascular density. Skull: Negative. Sinuses/Orbits: Visualized paranasal sinuses and mastoids are stable and well pneumatized. Other: Orbit and scalp soft tissues remain within normal limits. IMPRESSION: 1. Essentially stable CT appearance of the brain since 04/04/2020. Fairly extensive left hemisphere vasogenic edema with fading intra-axial blood products. Rightward midline shift of 10 mm. Mild right lateral ventriculomegaly. Suprasellar cistern patency has slightly improved. 2. No new intracranial abnormality. Study discussed by telephone with Dr. Leanor Kail on 04/06/2020 at 23:55 . Electronically Signed   By: Genevie Ann M.D.   On: 04/07/2020 00:00   DG CHEST PORT 1 VIEW  Result Date: 04/07/2020 CLINICAL DATA:  44 year old female with shortness of breath.  Fever. EXAM: PORTABLE CHEST 1 VIEW  COMPARISON:  Portable chest yesterday. FINDINGS: Portable AP semi upright view at 0027 hours. Mildly lower lung volumes. Mediastinal contours remain normal. Visualized tracheal air column is within normal limits. New right upper extremity approach PICC line placed. Tip is at the cavoatrial junction level. Allowing for portable technique both lungs remain clear. No pneumothorax. No osseous abnormality identified.  Paucity of bowel gas. IMPRESSION: Right PICC line placed.  No acute cardiopulmonary abnormality. Electronically Signed   By: Genevie Ann M.D.   On: 04/07/2020 00:58      PHYSICAL EXAM   Temp:  [97.6 F (36.4 C)-99.8 F (37.7 C)] 97.6 F (36.4 C) (10/19 0800) Pulse Rate:  [37-56] 45 (10/19 1000) Resp:  [11-33] 17 (10/19 1000) BP: (102-161)/(57-93) 128/72 (10/19 1000) SpO2:  [98 %-100 %] 100 % (10/19 1000)  General - Well nourished, well developed, in no apparent distress.  Ophthalmologic - fundi not visualized due to noncooperation.  Cardiovascular - Regular rhythm and rate.  Respiratory - intermittent but frequent hyperventilation.  Mental Status -  Level of arousal and orientation to month, place, and person were intact, but not to year and age. Language including expression, naming, repetition, comprehension was assessed and found intact. However, still with left right confusion. Fund of Knowledge was assessed and was intact.  Cranial Nerves II - XII - II - Visual field showed right lower quadrantanopia. III, IV, VI - Extraocular movements intact. V - Facial sensation intact bilaterally. VII - Facial movement intact bilaterally. VIII - Hearing & vestibular intact bilaterally. X - Palate elevates symmetrically. XI - Chin turning & shoulder shrug intact bilaterally. XII - Tongue protrusion intact.  Motor Strength - The patient's strength was normal in all extremities and pronator drift was absent.  Bulk was normal and fasciculations were absent.   Motor Tone - Muscle tone  was assessed at the neck and appendages and was normal.  Reflexes - The patient's reflexes were symmetrical in all extremities and she had no pathological reflexes.  Sensory - Light touch, temperature/pinprick were assessed and were symmetrical.    Coordination - The patient had normal movements in the hands with no ataxia or dysmetria.  Tremor was absent.  Gait and Station - deferred.   ASSESSMENT/PLAN Erica Rosario is a 44 y.o. 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 [facility] yesterday (04/03/20) on Keppra, Topamax and Coumadin presenting again to the ED with a chief complaint of headache when she woke up with intermittent worsening. Reports generalized weakness and "faintness", but has no  focal deficits. She remains nauseated and has had several episodes of emesis. Pt found to have worsening vasogenic edema in L parietooccipital region and worsening midline shift of 13 mm. Warfarin stopped - Vit K 2.5 mg once - mannitol once - Decadron, hypertonic saline, and Heparin gtt started.   Cerebral Edema  CT head Left parieto-occipital venous infarct with increased edema and mass effect including 13 mm of rightward midline shift. 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 Also thrombosis draining cortical vein. 2 areas enhancement in L occipital hemorrhage possible ongoing hemorrhage. Similar edema w/ slight R shift.  CTA and CTV 10/9 - Partial thrombosis of the superior sagittal sinus at the vertex, Thrombosed superficial draining vein on the left, Hemorrhagic infarction in the left parietal cortical and subcortical brain   Pan CT neg for malignancy, B renal vein nonocclusive thrombosis.  CTV 10/16 Decreased volume of nonocclusive superior sagittal sinus Thrombus. Decreased conspicuity of thrombus within an adjacent left cortical vein.  Repeat CT head improved MLS, cerebral edema and decreased hydrocephalus   2D Echo EF 60-65%. No source of embolus   LE dopplers no DVT   LDL UTC, TC 210, TG 132, direct LDL 151.9  HgbA1c 5.6  UDS +benzos  Hypercoagulable labs unremarkable (anticardiolipin IgM slightly elevated, prot S total and activity mildly low - not accurate on heparin)  VTE prophylaxis - heparin IV    No antithrombotic prior to admission, now on heparin IV -> transition to full dose lovenox. Hold off coumadin now   Decadron 4mg  IV q6h ->2mg  IV q 6h    Therapy recommendations:  none   Disposition:  pending   Seizure   10/6 GTC seizure in ER   S/p ativan and keppra load  Continue keppra 500 bid  LTM EEG - L frontotemporal continuous slowing, B parieto-occipital spikes   Seizure precautions  Severe anemia  Hb 8.7->8.8->8.0->7.2-> 2U PRBC -> 10.7  Low MCV and MCH  Iron 13(L), TIBC 552 (h), ferritin 10 (L)  On iron pills  Transfused 2u 10/18  Stool guiac neg   CBC monitoring  Leukocytosis and fever  Tmax 101.7->afebrile  WBC 7.7->5.5->11.4->13.1  Dysuria   CXR  negative  UA neg, UCx <10k insignificant growth  Blood Cx 10/18 no growth < 24h   Rocephin 10/18>> for 3 days  Elevated Liver Enzynes  Etiology unclear  ? Related to topamax ? - d/c   AST/ ALT 150/262->111/229->76/182->150/241->189/300->144/283   CT abd/pelvis last admission w/ fatty liver   Hepatitis panel pending  Hyperlipidemia  Home Lipid lowering medication: Lipitor 40 mg daily  LDL LDL UTC, TC 210, TG 132, direct LDL  151.9  lipitor 40 resumed  Continue statin at discharge  OCP use  On OTC 28 for contraception   Not smoker  Recommend to avoid OCP use, especially estrogen use  Syncope 10/15 at home  could be orthostasis in the setting of anemia  now anemia improved after PRBC  orthostatic VS 10/19 Supine BP 134/73, Sitting BP 134/71, Standing BP 130/82  PT/OT no recs  Other Stroke Risk Factors    Other Active Problems  Code status - Full code  Bradycardia - 50's  Thrombocytosis PLT 532->518->462->422   Hypokalemia K 3.7->3.3 ->3.4 - supplement   Hospital day # 3  This patient is critically ill due to cerebral edema, cerebral venous thrombosis, severe anemia, fever and leukocytosis, shortness of breath with hyperventilation, metabolic acidosis, seizure, elevated liver enzymes. And at significant risk of neurological worsening, death form brain herniation, venous thrombosis extension, hypovolemic shock, sepsis, PE, status epilepticus, liver failure. This patient's care requires constant monitoring of vital signs, hemodynamics, respiratory and cardiac monitoring, review of multiple databases, neurological assessment, discussion with family, other specialists and medical decision making of high complexity. I spent 40 minutes of neurocritical care time in the care of this patient. I had long discussion with patient and her husband at bedside, updated pt current condition, treatment plan and potential prognosis, and answered all the questions.  They  expressed understanding and appreciation.    Erica Hawking, MD PhD Stroke Neurology 04/07/2020 11:05 AM   To contact Stroke Continuity provider, please refer to [email or web address] After hours, contact General Neurology

## 2020-04-07 NOTE — Progress Notes (Signed)
0830 Messaged Xu MD regarding patient BP goals and that HR remains in 40s. Patient with no current complaints. Xu to bedside-- SBP goal now <160 with PRN hydralazine order updated; will continue to monitor HR.   1645 Notified Xu MD regarding HR maintaining 38-45, no patient complaints at this time.  Continue to monitor; per Erlinda Hong MD if HR below 35 or with complaints notify MD.

## 2020-04-07 NOTE — Progress Notes (Signed)
Bilateral Lower Ext. study completed.   See CVProc for preliminary results.   Akeiba Axelson, RDMS, RVT 

## 2020-04-07 NOTE — Progress Notes (Signed)
ANTICOAGULATION CONSULT NOTE - Follow Up Consult  Pharmacy Consult for heparin Indication: dural venous sinus thrombosis  Labs: Recent Labs    04/04/20 1423 04/04/20 1423 04/05/20 0240 04/05/20 0512 04/06/20 0127 04/06/20 0735 04/06/20 2242  HGB 8.9*   < > 8.0*  --   --  7.2*  --   HCT 31.0*  --  26.8*  --   --  24.7*  --   PLT 532*  --  518*  --   --  462*  --   APTT 28  --   --   --   --   --   --   LABPROT 20.2*  --   --   --   --   --   --   INR 1.8*  --   --   --   --   --   --   HEPARINUNFRC  --   --  0.21*   < > 0.82* 0.80* 0.28*  CREATININE 0.74  --   --   --   --  0.62  --    < > = values in this interval not displayed.    Assessment/Plan:  44yo female slightly subtherapeutic on heparin after rate changes though pt's Hgb has gone down and has rec'd transfusion, FOB negative but another one has been sent, and neuro had held heparin (after this level drawn) for elevated BP, now to resume. Will resume gtt at same rate and check additional level.     Wynona Neat, PharmD, BCPS  04/07/2020,12:03 AM

## 2020-04-07 NOTE — Progress Notes (Signed)
[name] for heparin > Lovenox  Indication: Dural venous sinus thrombosis  No Known Allergies  Patient Measurements: Height: 5' (152.4 cm) Weight: 66.2 kg (145 lb 15.1 oz) IBW/kg (Calculated) : 45.5 Heparin Dosing Weight: 59.7 kg  Vital Signs: Temp: 97.6 F (36.4 C) (10/19 0800) Temp Source: Oral (10/19 0800) BP: 143/76 (10/19 0845) Pulse Rate: 48 (10/19 0845)  Labs: Recent Labs    04/04/20 1423 04/04/20 1423 04/05/20 0240 04/05/20 0240 04/05/20 0512 04/06/20 0127 04/06/20 0735 04/06/20 2242 04/07/20 0055 04/07/20 0602  HGB 8.9*   < > 8.0*   < >  --   --  7.2*  --   --  10.7*  HCT 31.0*   < > 26.8*  --   --   --  24.7*  --   --  Erica.1*  PLT 532*   < > 518*  --   --   --  462*  --   --  422*  APTT 28  --   --   --   --   --   --   --   --   --   LABPROT 20.2*  --   --   --   --   --   --   --   --   --   INR 1.8*  --   --   --   --   --   --   --   --   --   HEPARINUNFRC  --   --  0.21*  --    < > 0.82* 0.80* 0.28*  --   --   CREATININE 0.74  --   --   --   --   --  0.62  --  0.62  --    < > = values in this interval not displayed.    Estimated Creatinine Clearance: 75.4 mL/min (by C-G formula based on SCr of 0.62 mg/dL).   Medical History: Past Medical History:  Diagnosis Date  . Intracerebral hemorrhage (HCC)    Assessment: Erica Rosario presenting with headache, hx of dural sinus thrombosis on warfarin PTA. INR 1.8 on admission which was reversed with Vit K and patient was started on IV heparin.   Currently therapeutic on IV heparin but transitioning patient to treatment dose Lovenox. Hgb improved after last night's transfusion. Plt stable.   Goal of Therapy:  Anti-Xa level 0.6-1 units/ml 4hrs after LMWH dose given Monitor platelets by anticoagulation protocol: Yes   Plan:  Stop IV heparin and start Lovenox 65 mg (1 mg/kg) twice daily  Monitor daily CBC and for s/s of bleeding    [name], PharmD., BCPS,  BCCCP Clinical Pharmacist Please refer to [facility] for unit-specific pharmacist

## 2020-04-07 NOTE — Evaluation (Signed)
Physical Therapy Evaluation Patient Details Name: Erica Rosario MRN: [id number] DOB: May 19, 1976 Today's Date: 04/07/2020   History of Present Illness  Patient is a 44 y/o female who presents on 10/16 with HA, weakness, nausea, vomiting and syncopal episode after being d/ced from [place] on 10/15 with recent sinus venous thrombosis and ICH in the R occipital region. Found to have worsening vasogenic edema in Lft parieto-occipital region and worsening midline shift of 30mm.  Clinical Impression  Patient presents with mild headache, mild balance deficits and impaired mobility s/p above. Pt was recently d/ced from [place] on 10/15 and readmitted the next day for above symptoms. Today, pt tolerated bed mobility, transfers and gait training with Min guard-Mod I for balance/safety. Reports dizziness present only with fast head movements. Very guarded gait pattern. Pt has support of spouse at d/c. Will follow acutely to maximize independence and mobility prior to return home. Negative orthostatics. Supine BP 134/73 Sitting BP 134/71 Standing BP 130/82 Pt was asymptomatic throughout activity. Pt bradycardic with HR in 40s-50s bpm which is baseline per nurse.     Follow Up Recommendations No PT follow up;Supervision for mobility/OOB (pending progress)    Equipment Recommendations  None recommended by PT    Recommendations for Other Services       Precautions / Restrictions Precautions Precautions: Fall Precaution Comments: BP <180 Restrictions Weight Bearing Restrictions: No      Mobility  Bed Mobility Overal bed mobility: Modified Independent Bed Mobility: Supine to Sit;Sit to Supine     Supine to sit: Modified independent (Device/Increase time);HOB elevated Sit to supine: Modified independent (Device/Increase time);HOB elevated   General bed mobility comments: Performed x3 without use of rail, HOB elevated. No dizziness reported.  Transfers Overall transfer level: Needs  assistance Equipment used: None Transfers: Sit to/from Stand Sit to Stand: Min guard         General transfer comment: Min guard for safety. Stood from [employer]. No dizzinesshowever closing eyes for a few seconds.  Ambulation/Gait Ambulation/Gait assistance: Min guard Gait Distance (Feet): 200 Feet Assistive device: None Gait Pattern/deviations: Step-through pattern;Decreased stride length Gait velocity: reduced Gait velocity interpretation: <1.31 ft/sec, indicative of household ambulator General Gait Details: Slow, guarded gait. Reports some dizziness with fast head turns otherwise asymptomatic. HR bradycardic in 40s-50s bpm.  Stairs            Wheelchair Mobility    Modified Rankin (Stroke Patients Only) Modified Rankin (Stroke Patients Only) Pre-Morbid Rankin Score: No symptoms Modified Rankin: Moderate disability     Balance Overall balance assessment: Needs assistance Sitting-balance support: Feet supported;No upper extremity supported Sitting balance-Leahy Scale: Good Sitting balance - Comments: ASsist to donn underwear   Standing balance support: During functional activity Standing balance-Leahy Scale: Fair Standing balance comment: Able to stand statically and walk without UE support, close Min guard for safety.               High Level Balance Comments: Spouse present during session.             Pertinent Vitals/Pain Pain Assessment: Faces Faces Pain Scale: Hurts a little bit Pain Location: head Pain Descriptors / Indicators: Headache Pain Intervention(s): Monitored during session    Home Living Family/patient expects to be discharged to:: Private residence Living Arrangements: Spouse/significant other;Children Available Help at Discharge: Family;Available 24 hours/day Type of Home: House Home Access: Stairs to enter Entrance Stairs-Rails: Can reach both;Left;Right Entrance Stairs-Number of Steps: 3 Home Layout: Laundry or work area in  basement;Two level  Home Equipment: Grab bars - tub/shower;Hand held shower head;Shower seat;Grab bars - toilet      Prior Function Level of Independence: Independent         Comments: works sewing     Journalist, newspaper   Dominant Hand: Right    Extremity/Trunk Assessment   Upper Extremity Assessment Upper Extremity Assessment: Defer to OT evaluation    Lower Extremity Assessment Lower Extremity Assessment: Overall WFL for tasks assessed    Cervical / Trunk Assessment Cervical / Trunk Assessment: Normal  Communication   Communication: Prefers language other than English  Cognition Arousal/Alertness: Awake/alert Behavior During Therapy: WFL for tasks assessed/performed Overall Cognitive Status: Difficult to assess                                 General Comments: Follows commands well, appears [facility] for basic mobility tasks. needs to be assessed further.      General Comments      Exercises     Assessment/Plan    PT Assessment Patient needs continued PT services  PT Problem List Decreased balance;Decreased mobility;Decreased activity tolerance       PT Treatment Interventions Functional mobility training;Balance training;Therapeutic activities;Neuromuscular re-education;Cognitive remediation;Patient/family education;Stair training;Therapeutic exercise;Gait training    PT Goals (Current goals can be found in the Care Plan section)  Acute Rehab PT Goals Patient Stated Goal: to get better and go home PT Goal Formulation: With patient/family Time For Goal Achievement: 04/21/20 Potential to Achieve Goals: Good    Frequency Min 4X/week   Barriers to discharge Inaccessible home environment stairs    Co-evaluation               AM-PAC PT "6 Clicks" Mobility  Outcome Measure Help needed turning from your back to your side while in a flat bed without using bedrails?: None Help needed moving from lying on your back to sitting on the side of a  flat bed without using bedrails?: None Help needed moving to and from a bed to a chair (including a wheelchair)?: A Little Help needed standing up from a chair using your arms (e.g., wheelchair or bedside chair)?: A Little Help needed to walk in hospital room?: A Little Help needed climbing 3-5 steps with a railing? 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

## 2020-04-07 NOTE — Evaluation (Signed)
Occupational Therapy Evaluation Patient Details Name: Erica Rosario MRN: [id number] DOB: Jan 07, 1976 Today's Date: 04/07/2020    History of Present Illness Patient is a 44 y/o female who presents on 10/16 with HA, weakness, nausea, vomiting and syncopal episode after being d/ced from [place] on 10/15 with recent sinus venous thrombosis and ICH in the R occipital region. Found to have worsening vasogenic edema in Lft parieto-occipital region and worsening midline shift of 82mm.   Clinical Impression   This 44 y/o female presents with the above. Pt with recent admission, CIR stay and d/c home now readmitted. Pt reports she returned home with supervision/intermittent assist from spouse and family for ADL assist. Pt with notable visual deficits (R homonymous hemianopsia) and also ?suspect cognitive impairments. Pt performing room level mobility with up to minA and requiring up to minA for ADL tasks. Pt requiring verbal cues for locating ADL items on sink and for sequencing through portions of ADL tasks. Spouse present and supportive during session as well. BP monitored and negative for orthostatic hypotension (pt reports slight dizziness initially with standing which subsides given increased time upright). HR in the 50s-70s throughout. Pt will benefit from continued acute OT services and currently recommend follow up neuro outpatient OT services to further address visual and cognitive deficits at time of discharge to maximize her overall safety and independence with ADL and mobility. Will follow.     Follow Up Recommendations  Outpatient OT;Supervision/Assistance - 24 hour (outpt neuro OT)    Equipment Recommendations  Tub/shower seat           Precautions / Restrictions Precautions Precautions: Fall Precaution Comments: BP <160 Restrictions Weight Bearing Restrictions: No      Mobility Bed Mobility Overal bed mobility: Modified Independent             General bed mobility comments:  HOB elevated  Transfers Overall transfer level: Needs assistance Equipment used: None Transfers: Sit to/from Stand Sit to Stand: Min guard         General transfer comment: for balance and safety, sit<>stand from EOB x2 and from [facility] in bathroom x1     Balance Overall balance assessment: Needs assistance Sitting-balance support: Feet supported;No upper extremity supported Sitting balance-Leahy Scale: Good     Standing balance support: During functional activity Standing balance-Leahy Scale: Fair Standing balance comment: able to maintain static balance without UE support and close minguard assist                            ADL either performed or assessed with clinical judgement   ADL Overall ADL's : Needs assistance/impaired Eating/Feeding: Set up;Sitting   Grooming: Wash/dry face;Oral care;Min guard;Sitting Grooming Details (indicate cue type and reason): min cues for sequencing and to locate items due to vision ; performed in sitting at pt's request given headache  Upper Body Bathing: Minimal assistance;Sitting   Lower Body Bathing: Minimal assistance;Sit to/from stand   Upper Body Dressing : Minimal assistance;Sitting   Lower Body Dressing: Minimal assistance;Sit to/from stand Lower Body Dressing Details (indicate cue type and reason): pt's spouse assisted to don socks while OT out of room gathering supplies  Toilet Transfer: Minimal assistance;Ambulation   Toileting- Clothing Manipulation and Hygiene: Minimal assistance;Sitting/lateral lean;Sit to/from stand       Functional mobility during ADLs: Minimal assistance       Vision Baseline Vision/History: Wears glasses Wears Glasses: Reading only Patient Visual Report: Peripheral vision impairment Vision Assessment?: Yes  Eye Alignment: Within Functional Limits Ocular Range of Motion: Restricted on the right Alignment/Gaze Preference: Within Defined Limits Tracking/Visual Pursuits: Decreased  smoothness of eye movement to RIGHT superior field;Decreased smoothness of eye movement to RIGHT inferior field Visual Fields: Right homonymous hemianopsia;Impaired-to be further tested in functional context     Perception     Praxis      Pertinent Vitals/Pain Pain Assessment: 0-10 Pain Score: 5  Pain Location: headache Pain Descriptors / Indicators: Headache Pain Intervention(s): Limited activity within patient's tolerance;Monitored during session;Patient requesting pain meds-RN notified     Hand Dominance Right   Extremity/Trunk Assessment Upper Extremity Assessment Upper Extremity Assessment: Overall WFL for tasks assessed   Lower Extremity Assessment Lower Extremity Assessment: Defer to PT evaluation       Communication Communication Communication: Prefers language other than [place] (Spanish speaking, speaks some [place] )   Cognition Arousal/Alertness: Awake/alert Behavior During Therapy: WFL for tasks assessed/performed Overall Cognitive Status: Impaired/Different from baseline                               Problem Solving: Requires verbal cues;Difficulty sequencing General Comments: pt having difficulty sequencing carrying out steps for grooming ADL tasks, requires cues to do so. pt intermittently stating "I'm confused" when having difficulty completing task   General Comments  spouse present during session    Exercises     Shoulder Instructions      Home Living Family/patient expects to be discharged to:: Private residence Living Arrangements: Spouse/significant other;Children Available Help at Discharge: Family;Available 24 hours/day Type of Home: House Home Access: Stairs to enter [employer] of Steps: 3 Entrance Stairs-Rails: Can reach both;Left;Right Home Layout: Laundry or work area in basement;Two level Alternate Level Stairs-Number of Steps: 7 steps to basement Alternate Level Stairs-Rails: Right;Left Bathroom Shower/Tub:  Tub/shower unit;Curtain         Home Equipment: Grab bars - tub/shower;Hand held shower head;Shower seat;Grab bars - toilet      Lives With: Spouse    Prior Functioning/Environment Level of Independence: Independent        Comments: works sewing        OT Problem List: Decreased strength;Decreased activity tolerance;Impaired balance (sitting and/or standing);Impaired vision/perception;Decreased coordination;Decreased cognition;Pain      OT Treatment/Interventions: Self-care/ADL training;Therapeutic exercise;Neuromuscular education;Energy conservation;DME and/or AE instruction;Therapeutic activities;Cognitive remediation/compensation;Patient/family education;Balance training;Visual/perceptual remediation/compensation    OT Goals(Current goals can be found in the care plan section) Acute Rehab OT Goals Patient Stated Goal: to get better and go home OT Goal Formulation: With patient/family Time For Goal Achievement: 04/21/20 Potential to Achieve Goals: Good  OT Frequency: Min 2X/week   Barriers to D/C:            Co-evaluation              AM-PAC OT "6 Clicks" Daily Activity     Outcome Measure Help from another person eating meals?: A Little Help from another person taking care of personal grooming?: A Little Help from another person toileting, which includes using toliet, bedpan, or urinal?: A Little Help from another person bathing (including washing, rinsing, drying)?: A Little Help from another person to put on and taking off regular upper body clothing?: A Little Help from another person to put on and taking off regular lower body clothing?: A Little 6 Click Score: 18   End of Session Equipment Utilized During Treatment: Gait belt Nurse Communication: Mobility status  Activity Tolerance: Patient tolerated treatment  well Patient left: in bed;with call bell/phone within reach;with bed alarm set;with family/visitor present  OT Visit Diagnosis: Unsteadiness  on feet (R26.81);Other symptoms and signs involving the nervous system (R29.898);Other symptoms and signs involving cognitive function                Time: 5848-3507 OT Time Calculation (min): 34 min Charges:  OT General Charges $OT Visit: 1 Visit OT Evaluation $OT Eval Moderate Complexity: 1 Mod OT Treatments $Self Care/Home Management : 8-22 mins  Lou Cal, OT Acute Rehabilitation Services Pager [phone] Office [place] 04/07/2020, 4:31 PM

## 2020-04-08 LAB — BASIC METABOLIC PANEL
Anion gap: 7 (ref 5–15)
BUN: 5 mg/dL — ABNORMAL LOW (ref 6–20)
CO2: 18 mmol/L — ABNORMAL LOW (ref 22–32)
Calcium: 7.8 mg/dL — ABNORMAL LOW (ref 8.9–10.3)
Chloride: 128 mmol/L — ABNORMAL HIGH (ref 98–111)
Creatinine, Ser: 0.58 mg/dL (ref 0.44–1.00)
GFR, Estimated: >60 mL/min (ref >60)
Glucose, Bld: 118 mg/dL — ABNORMAL HIGH (ref 70–99)
Potassium: 3.3 mmol/L — ABNORMAL LOW (ref 3.5–5.1)
Sodium: 153 mmol/L — ABNORMAL HIGH (ref 135–145)

## 2020-04-08 LAB — PROTIME-INR
INR: 1.1 (ref 0.8–1.2)
Prothrombin Time: 13.5 seconds (ref 11.4–15.2)

## 2020-04-08 LAB — CBC
HCT: 32.6 % — ABNORMAL LOW (ref 36.0–46.0)
Hemoglobin: 10.5 g/dL — ABNORMAL LOW (ref 12.0–15.0)
MCH: 25 pg — ABNORMAL LOW (ref 26.0–34.0)
MCHC: 32.2 g/dL (ref 30.0–36.0)
MCV: 77.6 fL — ABNORMAL LOW (ref 80.0–100.0)
Platelets: 387 10*3/uL (ref 150–400)
RBC: 4.2 MIL/uL (ref 3.87–5.11)
RDW: 19 % — ABNORMAL HIGH (ref 11.5–15.5)
WBC: 12.5 10*3/uL — ABNORMAL HIGH (ref 4.0–10.5)
nRBC: 4.3 % — ABNORMAL HIGH (ref 0.0–0.2)

## 2020-04-08 LAB — SODIUM
Sodium: 142 mmol/L (ref 135–145)
Sodium: 141 mmol/L (ref 135–145)
Sodium: 140 mmol/L (ref 135–145)

## 2020-04-08 LAB — HEPATITIS PANEL, ACUTE
HCV Ab: NONREACTIVE
Hep A IgM: NONREACTIVE
Hep B C IgM: NONREACTIVE
Hepatitis B Surface Ag: NONREACTIVE

## 2020-04-08 LAB — PATHOLOGIST SMEAR REVIEW: Path Review: INCREASED

## 2020-04-08 LAB — TSH: TSH: 0.395 u[IU]/mL (ref 0.350–4.500)

## 2020-04-08 MED ORDER — WARFARIN - PHARMACIST DOSING INPATIENT: Freq: Every day | Status: DC

## 2020-04-08 MED ORDER — DEXAMETHASONE 4 MG PO TABS
2.0000 mg | ORAL_TABLET | Freq: Four times a day (QID) | ORAL | Status: DC
  Administered 2020-04-08 – 2020-04-09 (×4): 2 mg via ORAL
  Filled 2020-04-08 (×4): qty 1

## 2020-04-08 MED ORDER — SODIUM CHLORIDE 3 % IV SOLN
INTRAVENOUS | Status: DC
  Filled 2020-04-08 (×4): qty 500

## 2020-04-08 MED ORDER — POTASSIUM CHLORIDE CRYS ER 20 MEQ PO TBCR
20.0000 meq | EXTENDED_RELEASE_TABLET | Freq: Three times a day (TID) | ORAL | Status: AC
  Administered 2020-04-08 (×3): 20 meq via ORAL
  Filled 2020-04-08 (×3): qty 1

## 2020-04-08 MED ORDER — WARFARIN SODIUM 6 MG PO TABS
6.0000 mg | ORAL_TABLET | Freq: Once | ORAL | Status: AC
  Administered 2020-04-08: 6 mg via ORAL
  Filled 2020-04-08: qty 1

## 2020-04-08 NOTE — Progress Notes (Signed)
STROKE TEAM PROGRESS NOTE   INTERVAL HISTORY Niece and RN are at bedside.  Patient complains of mild headache, denies any shortness of breath.  Still has asymptomatic bradycardia at high 30s and low 40s.  Sodium 142.  Will decrease 3% saline, change Decadron to p.o.  Repeat CT in the a.m.  OBJECTIVE Vitals:   04/08/20 0500 04/08/20 0600 04/08/20 0700 04/08/20 0822  BP: (!) 152/85 (!) 158/68 (!) 160/76   Pulse: (!) 39 (!) 38 (!) 42   Resp: 17 17 (!) 23   Temp:    99.8 F (37.7 C)  TempSrc:    Oral  SpO2: 98% 99% 98%   Weight:      Height:       CBC:  Recent Labs  Lab 04/04/20 1423 04/05/20 0240 04/07/20 0602 04/08/20 0526  WBC 7.7   < > 13.1* 12.5*  NEUTROABS 5.8  --   --   --   HGB 8.9*   < > 10.7* 10.5*  HCT 31.0*   < > 34.1* 32.6*  MCV 76.2*   < > 78.2* 77.6*  PLT 532*   < > 422* 387   < > = values in this interval not displayed.   Basic Metabolic Panel:  Recent Labs  Lab 04/07/20 0055 04/07/20 1133 04/08/20 0526 04/08/20 0651  NA 143   < > 153* 142  K 3.4*  --  3.3*  --   CL 119*  --  128*  --   CO2 15*  --  18*  --   GLUCOSE 132*  --  118*  --   BUN 7  --  5*  --   CREATININE 0.62  --  0.58  --   CALCIUM 7.9*  --  7.8*  --   MG 2.1  --   --   --    < > = values in this interval not displayed.   Lipid Panel:     Component Value Date/Time   CHOL 210 (H) 03/26/2020 1010   TRIG 132 03/26/2020 1010   HDL 53 03/26/2020 1010   CHOLHDL 4.0 03/26/2020 1010   VLDL 26 03/26/2020 1010   LDLCALC NOT CALCULATED 03/26/2020 1010   HgbA1c:  Lab Results  Component Value Date   HGBA1C 5.6 03/26/2020    IMAGING VAS [place] LOWER EXTREMITY VENOUS (DVT)  Result Date: 04/07/2020  Lower Venous DVTStudy Indications: Edema.  Risk Factors: Thrombus ICH. Performing Technologist: Griffin Basil RCT RDMS  Examination Guidelines: A complete evaluation includes B-mode imaging, spectral Doppler, color Doppler, and power Doppler as needed of all accessible portions of each  vessel. Bilateral testing is considered an integral part of a complete examination. Limited examinations for reoccurring indications may be performed as noted. The reflux portion of the exam is performed with the patient in reverse Trendelenburg.  +---------+---------------+---------+-----------+----------+--------------+ RIGHT    CompressibilityPhasicitySpontaneityPropertiesThrombus Aging +---------+---------------+---------+-----------+----------+--------------+ CFV      Full           Yes      Yes                                 +---------+---------------+---------+-----------+----------+--------------+ SFJ      Full                                                        +---------+---------------+---------+-----------+----------+--------------+  FV Prox  Full                                                        +---------+---------------+---------+-----------+----------+--------------+ FV Mid   Full                                                        +---------+---------------+---------+-----------+----------+--------------+ FV DistalFull                                                        +---------+---------------+---------+-----------+----------+--------------+ PFV      Full                                                        +---------+---------------+---------+-----------+----------+--------------+ POP      Full           Yes      Yes                                 +---------+---------------+---------+-----------+----------+--------------+ PTV      Full                                                        +---------+---------------+---------+-----------+----------+--------------+ PERO     Full                                                        +---------+---------------+---------+-----------+----------+--------------+   +---------+---------------+---------+-----------+----------+--------------+ LEFT      CompressibilityPhasicitySpontaneityPropertiesThrombus Aging +---------+---------------+---------+-----------+----------+--------------+ CFV      Full           Yes      Yes                                 +---------+---------------+---------+-----------+----------+--------------+ SFJ      Full                                                        +---------+---------------+---------+-----------+----------+--------------+ FV Prox  Full                                                        +---------+---------------+---------+-----------+----------+--------------+  FV Mid   Full                                                        +---------+---------------+---------+-----------+----------+--------------+ FV DistalFull                                                        +---------+---------------+---------+-----------+----------+--------------+ PFV      Full                                                        +---------+---------------+---------+-----------+----------+--------------+ POP      Full           Yes      Yes                                 +---------+---------------+---------+-----------+----------+--------------+ PTV      Full                                                        +---------+---------------+---------+-----------+----------+--------------+ PERO     Full                                                        +---------+---------------+---------+-----------+----------+--------------+     Summary: RIGHT: - There is no evidence of deep vein thrombosis in the lower extremity.  - No cystic structure found in the popliteal fossa.  LEFT: - There is no evidence of deep vein thrombosis in the lower extremity.  - No cystic structure found in the popliteal fossa.  *See table(s) above for measurements and observations. Electronically signed by Curt Jews MD on 04/07/2020 at 4:24:42 PM.    Final       PHYSICAL  EXAM  Temp:  [97.9 F (36.6 C)-99.8 F (37.7 C)] 99.8 F (37.7 C) (10/20 0822) Pulse Rate:  [36-62] 42 (10/20 0700) Resp:  [11-25] 23 (10/20 0700) BP: (103-160)/(50-95) 160/76 (10/20 0700) SpO2:  [97 %-100 %] 98 % (10/20 0700)  General - Well nourished, well developed, in no apparent distress.  Ophthalmologic - fundi not visualized due to noncooperation.  Cardiovascular - Regular rhythm and rate.  Respiratory - intermittent but frequent hyperventilation.  Mental Status -  Level of arousal and orientation to month, place, time and person were intact, but not to age. Language including expression, naming, repetition, comprehension was assessed and found intact. Fund of Knowledge was assessed and was intact.  Cranial Nerves II - XII - II - Visual field showed right lower quadrantanopia and upper quadrant decreased visual acuity. III, IV, VI - Extraocular movements intact. V - Facial sensation  intact bilaterally. VII - Facial movement intact bilaterally. VIII - Hearing & vestibular intact bilaterally. X - Palate elevates symmetrically. XI - Chin turning & shoulder shrug intact bilaterally. XII - Tongue protrusion intact.  Motor Strength - The patient's strength was normal in all extremities and pronator drift was absent.  Bulk was normal and fasciculations were absent.   Motor Tone - Muscle tone was assessed at the neck and appendages and was normal.  Reflexes - The patient's reflexes were symmetrical in all extremities and she had no pathological reflexes.  Sensory - Light touch, temperature/pinprick were assessed and were symmetrical.    Coordination - The patient had normal movements in the hands with no ataxia or dysmetria.  Tremor was absent.  Gait and Station - deferred.   ASSESSMENT/PLAN Ms. ELLER SWEIS is a 44 y.o. 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 [facility] yesterday (04/03/20) on Keppra, Topamax and Coumadin presenting again to the ED with a chief complaint of headache when she woke up with intermittent worsening. Reports generalized weakness and "faintness", but has no focal deficits. She remains nauseated and has had several episodes of emesis. Pt found to have worsening vasogenic edema in L parietooccipital region and worsening midline shift of 13 mm. Warfarin stopped - Vit K 2.5 mg once - mannitol once - Decadron, hypertonic saline, and Heparin gtt started.   Cerebral Edema  CT head Left parieto-occipital venous infarct with increased edema and mass effect including 13 mm of rightward midline shift. 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 Also thrombosis draining cortical vein. 2 areas enhancement in L occipital hemorrhage possible ongoing hemorrhage. Similar edema w/ slight R shift.  CTA and CTV 10/9 - Partial thrombosis of the superior sagittal sinus at the vertex, Thrombosed superficial draining vein on the left, Hemorrhagic infarction in the  left parietal cortical and subcortical brain   Pan CT neg for malignancy, B renal vein nonocclusive thrombosis.  CTV 10/16 Decreased volume of nonocclusive superior sagittal sinus Thrombus. Decreased conspicuity of thrombus within an adjacent left cortical vein.  Repeat CT head improved MLS, cerebral edema and decreased hydrocephalus   2D Echo EF 60-65%. No source of embolus   LE dopplers 10/19 - no DVT   LDL UTC, TC 210, TG 132, direct LDL 151.9  HgbA1c 5.6  UDS +benzos  Hypercoagulable labs unremarkable (anticardiolipin IgM slightly elevated, prot S total and activity mildly low - not accurate on heparin)  VTE prophylaxis - heparin IV    No antithrombotic  prior to admission, was on heparin IV -> now transition to full dose lovenox. Start coumadin today with INR goal 2-3  Decadron 4mg  IV q6h ->2mg  IV q 6h -> 2mg  po Q6h    Therapy recommendations:  Outpt OT  Disposition:  pending   Seizure   10/6 GTC seizure in ER   S/p ativan and keppra load  Continue keppra 500 bid  LTM EEG - L frontotemporal continuous slowing, B parieto-occipital spikes   Seizure precautions  Severe anemia  Hb 8.7->8.8->8.0->7.2-> 2U PRBC -> 10.7->10.5  Low MCV and MCH  Iron 13(L), TIBC 552 (h), ferritin 10 (L)  On iron pills  Transfused 2u 10/18  Stool guiac neg   CBC monitoring  Leukocytosis and fever  Tmax 101.7->afebrile  WBC 7.7->5.5->11.4->13.1->12.5  Dysuria   CXR negative  UA neg, UCx <10k insignificant growth  Blood Cx 10/18 no growth < 24h   Rocephin 10/18>> 10/20  Elevated Liver Enzynes  Etiology unclear  ? Related to topamax ? - d/c   AST/ ALT 150/262->111/229->76/182->150/241->189/300->144/283 -> pending  CT abd/pelvis last admission w/ fatty liver   Hepatitis panel neg  Hyperlipidemia  Home Lipid lowering medication: Lipitor 40 mg daily  LDL LDL UTC, TC 210, TG 132, direct LDL 151.9  lipitor 40 resumed  Continue statin at discharge  OCP  use  On OTC 28 for contraception   Not smoker  Recommend to avoid OCP use, especially estrogen use  Syncope 10/15 at home  Could be orthostasis in the setting of anemia  Now anemia improved after PRBC  Orthostatic VS 10/19 Supine BP 134/73, Sitting BP 134/71, Standing BP 130/82  Other Stroke Risk Factors    Other Active Problems  Code status - Full code  Bradycardia - 30s to 40s  Thrombocytosis PLT 532->518->462->422   Hypokalemia K 3.7->3.3 ->3.4 - supplement ->3.3 - supplement  Hospital day # 4  This patient is critically ill due to cerebral edema, cerebral venous sinus thrombosis, severe anemia, fever, seizure, bradycardia elevated liver enzymes and at significant risk of neurological worsening, death form recurrent thrombosis, hemorrhage, cardiac arrest. This patient's care requires constant monitoring of vital signs, hemodynamics, respiratory and cardiac monitoring, review of multiple databases, neurological assessment, discussion with family, other specialists and medical decision making of high complexity. I spent 35 minutes of neurocritical care time in the care of this patient. I had long discussion with niece and patient at bedside, updated pt current condition, treatment plan and potential prognosis, and answered all the questions.  They expressed understanding and appreciation.    Rosalin Hawking, MD PhD Stroke Neurology 04/08/2020 7:51 PM   To contact Stroke Continuity provider, please refer to [email or web address] After hours, contact General Neurology

## 2020-04-08 NOTE — Progress Notes (Signed)
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 Found to have worsening vasogenic edema in Lft parieto-occipital region and worsening midline shift of 64mm.   OT comments  Pt progressing towards OT goals. She continues to have limitations due to visual deficits and suspect higher level cognitive deficits as well. Pt performing room level mobility and ADL tasks with minguard assist throughout. Additional focus on visual scanning tabletop activities. Pt completing letter elimination activity with approx 80% accuracy, with notable difficulty performing clock drawing task (numbers written only on R half of clock). Further education provided to both pt/pt's niece (present during session) on use of compensatory scanning methods and how to incorporate into daily functional/mobility tasks. Continue to recommend outpatient neuro OT services and recommend she have hands on 24hr assist initially at time of discharge to maximize her overall safety and independence with ADL and mobility.   Follow Up Recommendations  Outpatient OT;Supervision/Assistance - 24 hour (outpt neuro OT)    Equipment Recommendations  Tub/shower seat          Precautions / Restrictions Precautions Precautions: Fall Precaution Comments: BP <160 Restrictions Weight Bearing Restrictions: No       Mobility Bed Mobility Overal bed mobility: Modified Independent       Supine to sit: Modified independent (Device/Increase time) Sit to supine: Modified independent (Device/Increase time)   General bed mobility comments: HOB elevated  Transfers Overall transfer level: Needs assistance Equipment used:  None Transfers: Sit to/from Stand Sit to Stand: Supervision         General transfer comment: supervision for safety due to many lines    Balance Overall balance assessment: Needs assistance Sitting-balance support: Feet supported;No upper extremity supported Sitting balance-Leahy Scale: Good     Standing balance support: During functional activity Standing balance-Leahy Scale: Fair                             ADL either performed or assessed with clinical judgement   ADL Overall ADL's : Needs assistance/impaired     Grooming: Wash/dry hands;Min guard;Standing Grooming Details (indicate cue type and reason): cues to locate paper towels              Lower Body Dressing: Min guard;Sit to/from stand Lower Body Dressing Details (indicate cue type and reason): encouraged pt don her socks on her own as pt's niece initially attempting to assist; pt was able to complete without difficulty  Toilet Transfer: Min guard;Ambulation;Regular Museum/gallery exhibitions officer and Hygiene: Min guard;Sit to/from stand;Sitting/lateral lean Toileting - Clothing Manipulation Details (indicate cue type and reason): pt performing pericare and clothing management with minguard for safety, no physical assist required      Functional mobility during ADLs: Min guard                         Cognition Arousal/Alertness: Awake/alert Behavior During Therapy: WFL for tasks assessed/performed                                   General Comments: improvements noted  in ability to utilize visual compensatory strategies, significant difficulty with clock drawing task though suspect this is partly due to visual deficits as well         Exercises Exercises: Other exercises Other Exercises Other Exercises: visual scanning activities including scanning/elimination of letters and locating/opening containers in R visual field (as pt reports difficulty using RUE  given vision deficits). clock drawing task. educated in use of "lighthouse method" for these written activities as well as for day to day activities    Shoulder Instructions       General Comments pt's neice present and assisting to interpret during session. pt denies dizziness today, HR 47-50s bpm with activity     Pertinent Vitals/ Pain       Pain Assessment: No/denies pain  Home Living                                          Prior Functioning/Environment              Frequency  Min 2X/week        Progress Toward Goals  OT Goals(current goals can now be found in the care plan section)  Progress towards OT goals: Progressing toward goals  Acute Rehab OT Goals Patient Stated Goal: to get better and go home OT Goal Formulation: With patient/family Time For Goal Achievement: 04/21/20 Potential to Achieve Goals: Good ADL Goals Pt Will Perform Grooming: with supervision;standing Pt Will Perform Lower Body Bathing: with supervision;sitting/lateral leans;sit to/from stand Pt Will Perform Upper Body Dressing: with modified independence;sitting Pt Will Perform Lower Body Dressing: with supervision;sit to/from stand Pt Will Transfer to Toilet: with supervision;ambulating Pt Will Perform Toileting - Clothing Manipulation and hygiene: with supervision;sit to/from stand Additional ADL Goal #1: Pt will independently identify ADL items in R visual field using compensatory techniques. Additional ADL Goal #2: Pt will participte in higher level cognitive assessment.  Plan Discharge plan remains appropriate    Co-evaluation                 AM-PAC OT "6 Clicks" Daily Activity     Outcome Measure   Help from another person eating meals?: A Little Help from another person taking care of personal grooming?: A Little Help from another person toileting, which includes using toliet, bedpan, or urinal?: A Little Help from another person bathing (including  washing, rinsing, drying)?: A Little Help from another person to put on and taking off regular upper body clothing?: A Little Help from another person to put on and taking off regular lower body clothing?: A Little 6 Click Score: 18    End of Session Equipment Utilized During Treatment: Gait belt  OT Visit Diagnosis: Unsteadiness on feet (R26.81);Other symptoms and signs involving the nervous system (R29.898);Other symptoms and signs involving cognitive function   Activity Tolerance Patient tolerated treatment well   Patient Left in bed;with call bell/phone within reach;with family/visitor present   Nurse Communication Mobility status        Time: 1525-1550 OT Time Calculation (min): 25 min  Charges: OT General Charges $OT Visit: 1 Visit OT Treatments $Self Care/Home Management : 8-22 mins $Therapeutic Activity: 8-22 mins  Lou Cal, OT Acute Rehabilitation Services Pager [phone] Office [phone]    Raymondo Band 04/08/2020, 5:06 PM

## 2020-04-08 NOTE — Progress Notes (Signed)
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 Found to have worsening vasogenic edema in Lft parieto-occipital region and worsening midline shift of 11mm.    PT Comments    Pt progressing well. Pt with improved command following and processing. DGI completed, scoring 16/24 indicating pt to be at moderate falls risk. Pt reports dizziness only with vertical head movement, no longer with horizontal head movement. Pt able to maintain balance and "catch" self with minimal and moderate perturbations in all directions.  Acute PT to cont to follow to improve balance and advance to independence with mobility and ADLs.   Follow Up Recommendations  No PT follow up;Supervision for mobility/OOB     Equipment Recommendations  None recommended by PT    Recommendations for Other Services       Precautions / Restrictions Precautions Precautions: Fall Precaution Comments: BP <160 Restrictions Weight Bearing Restrictions: No    Mobility  Bed Mobility Overal bed mobility: Modified Independent       Supine to sit: Modified independent (Device/Increase time) Sit to supine: Modified independent (Device/Increase time)   General bed mobility comments: HOB elevated  Transfers Overall transfer level: Needs assistance Equipment used: None Transfers: Sit to/from Stand Sit to Stand: Supervision         General transfer comment: supervision for safety due to many lines  Ambulation/Gait Ambulation/Gait assistance: Min guard Gait Distance (Feet): 200 Feet Assistive device: None Gait Pattern/deviations: Step-through pattern;Decreased stride length Gait velocity: reduced Gait  velocity interpretation: 1.31 - 2.62 ft/sec, indicative of limited community ambulator General Gait Details: Slow, guarded gait. Reports some dizziness with fast head turns otherwise asymptomatic. HR bradycardic in 40s-50s bpm.   Stairs Stairs: Yes Stairs assistance: Min guard Stair Management: One rail Left;Alternating pattern Number of Stairs: 2 General stair comments: 2 steps to mimic home   Wheelchair Mobility    Modified Rankin (Stroke Patients Only) Modified Rankin (Stroke Patients Only) Pre-Morbid Rankin Score: No symptoms Modified Rankin: Slight disability     Balance           Standing balance support: During functional activity Standing balance-Leahy Scale: Fair Standing balance comment: able to wash hands at sink without difficulty or requiring external support                 Standardized Balance Assessment Standardized Balance Assessment : Dynamic Gait Index   Dynamic Gait Index Level Surface: Mild Impairment Change in Gait Speed: Mild Impairment Gait with Horizontal Head Turns: Mild Impairment Gait with Vertical Head Turns: Moderate Impairment Gait and Pivot Turn: Mild Impairment Step Over Obstacle: Mild Impairment Step Around Obstacles: Mild Impairment Steps: Mild Impairment Total Score: 15      Cognition Arousal/Alertness: Awake/alert Behavior During Therapy: WFL for tasks assessed/performed Overall Cognitive Status: Within Functional Limits for tasks assessed                                 General Comments: pt able to follow simple and multi-step commands without difficulty, able to toliet and wash hands without cues      Exercises      General Comments General comments (skin integrity,  edema, etc.): pt taken to bathroom, pt able to toliet, perform pericare, and wash hands with supervision, assist for IV pole and lines only      Pertinent Vitals/Pain Pain Assessment: No/denies pain    Home Living                       Prior Function            PT Goals (current goals can now be found in the care plan section) Progress towards PT goals: Progressing toward goals    Frequency    Min 4X/week      PT Plan Current plan remains appropriate    Co-evaluation              AM-PAC PT "6 Clicks" Mobility   Outcome Measure  Help needed turning from your back to your side while in a flat bed without using bedrails?: None Help needed moving from lying on your back to sitting on the side of a flat bed without using bedrails?: None Help needed moving to and from a bed to a chair (including a wheelchair)?: None Help needed standing up from a chair using your arms (e.g., wheelchair or bedside chair)?: None Help needed to walk in hospital room?: A Little Help needed climbing 3-5 steps with a railing? 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

## 2020-04-08 NOTE — Progress Notes (Signed)
ANTICOAGULATION CONSULT NOTE  Pharmacy Consult for heparin > Lovenox  Indication: Dural venous sinus thrombosis  No Known Allergies  Patient Measurements: Height: 5' (152.4 cm) Weight: 66.2 kg (145 lb 15.1 oz) IBW/kg (Calculated) : 45.5 Heparin Dosing Weight: 59.7 kg  Vital Signs: Temp: 99.8 F (37.7 C) (10/20 0822) Temp Source: Oral (10/20 0822) BP: 132/69 (10/20 1400) Pulse Rate: 44 (10/20 1500)  Labs: Recent Labs    04/06/20 0735 04/06/20 0735 04/06/20 2242 04/07/20 0055 04/07/20 0602 04/07/20 0850 04/08/20 0526 04/08/20 1450  HGB 7.2*   < >  --   --  10.7*  --  10.5*  --   HCT 24.7*  --   --   --  34.1*  --  32.6*  --   PLT 462*  --   --   --  422*  --  387  --   LABPROT  --   --   --   --   --   --   --  13.5  INR  --   --   --   --   --   --   --  1.1  HEPARINUNFRC 0.80*  --  0.28*  --   --  0.28*  --   --   CREATININE 0.62  --   --  0.62  --   --  0.58  --    < > = values in this interval not displayed.    Estimated Creatinine Clearance: 75.4 mL/min (by C-G formula based on SCr of 0.58 mg/dL).   Medical History: Past Medical History:  Diagnosis Date  . 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 INR 1.8 on admission which was reversed with Vit K and patient was started on IV heparin.   Currently therapeutic on IV heparin but transitioning patient to treatment dose Lovenox. Hgb improved after last night's transfusion. Plt stable.   New orders to start back warfarin tonight. INR 1.1 this afternoon. Patient takes 4mg  daily prior to admit.   Goal of Therapy:  INR goal 2-3 Anti-Xa level 0.6-1 units/ml 4hrs after LMWH dose given Monitor platelets by anticoagulation protocol: Yes   Plan:  Lovenox 65 mg (1 mg/kg) twice daily  Warfarin 6mg  tonight Monitor daily CBC and INR    Erin Hearing PharmD., BCPS Clinical Pharmacist 04/08/2020 3:41 PM

## 2020-04-09 ENCOUNTER — Inpatient Hospital Stay (HOSPITAL_COMMUNITY): Payer: Medicaid Other

## 2020-04-09 LAB — CBC
HCT: 35.9 % — ABNORMAL LOW (ref 36.0–46.0)
Hemoglobin: 11.5 g/dL — ABNORMAL LOW (ref 12.0–15.0)
MCH: 24.6 pg — ABNORMAL LOW (ref 26.0–34.0)
MCHC: 32 g/dL (ref 30.0–36.0)
MCV: 76.7 fL — ABNORMAL LOW (ref 80.0–100.0)
Platelets: 404 10*3/uL — ABNORMAL HIGH (ref 150–400)
RBC: 4.68 MIL/uL (ref 3.87–5.11)
RDW: 20.2 % — ABNORMAL HIGH (ref 11.5–15.5)
WBC: 12.7 10*3/uL — ABNORMAL HIGH (ref 4.0–10.5)
nRBC: 2.3 % — ABNORMAL HIGH (ref 0.0–0.2)

## 2020-04-09 LAB — HEPATIC FUNCTION PANEL
ALT: 229 U/L — ABNORMAL HIGH (ref 0–44)
AST: 59 U/L — ABNORMAL HIGH (ref 15–41)
Albumin: 3.4 g/dL — ABNORMAL LOW (ref 3.5–5.0)
Alkaline Phosphatase: 89 U/L (ref 38–126)
Bilirubin, Direct: <0.1 mg/dL (ref 0.0–0.2)
Total Bilirubin: 0.4 mg/dL (ref 0.3–1.2)
Total Protein: 6.5 g/dL (ref 6.5–8.1)

## 2020-04-09 LAB — BASIC METABOLIC PANEL
Anion gap: 10 (ref 5–15)
BUN: 9 mg/dL (ref 6–20)
CO2: 19 mmol/L — ABNORMAL LOW (ref 22–32)
Calcium: 8.7 mg/dL — ABNORMAL LOW (ref 8.9–10.3)
Chloride: 109 mmol/L (ref 98–111)
Creatinine, Ser: 0.48 mg/dL (ref 0.44–1.00)
GFR, Estimated: >60 mL/min (ref >60)
Glucose, Bld: 119 mg/dL — ABNORMAL HIGH (ref 70–99)
Potassium: 3.9 mmol/L (ref 3.5–5.1)
Sodium: 138 mmol/L (ref 135–145)

## 2020-04-09 LAB — SODIUM: Sodium: 143 mmol/L (ref 135–145)

## 2020-04-09 LAB — PROTIME-INR
INR: 1.2 (ref 0.8–1.2)
Prothrombin Time: 14.4 seconds (ref 11.4–15.2)

## 2020-04-09 MED ORDER — DEXAMETHASONE 2 MG PO TABS
2.0000 mg | ORAL_TABLET | Freq: Three times a day (TID) | ORAL | Status: DC
  Administered 2020-04-09 – 2020-04-10 (×4): 2 mg via ORAL
  Filled 2020-04-09 (×4): qty 1

## 2020-04-09 MED ORDER — WARFARIN SODIUM 6 MG PO TABS
6.0000 mg | ORAL_TABLET | Freq: Once | ORAL | Status: AC
  Administered 2020-04-09: 6 mg via ORAL
  Filled 2020-04-09: qty 1

## 2020-04-09 NOTE — Progress Notes (Signed)
STROKE TEAM PROGRESS NOTE   INTERVAL HISTORY Sister and RN are at bedside.  Patient denies any shortness breath or headache, however complain of mild dizziness.  BP stable on the lower side, heart rate improved to low 50s and upper 60s.  CT repeat stable cerebral edema, improved from admission.  DC 3% saline, continue to wean Decadron.  OBJECTIVE Vitals:   04/09/20 0500 04/09/20 0600 04/09/20 0700 04/09/20 0800  BP: 129/78 (!) 151/81 119/62   Pulse: (!) 45 (!) 41 (!) 44   Resp: 17 15 15    Temp:    98.2 F (36.8 C)  TempSrc:    Oral  SpO2: 99% 99% 98%   Weight:      Height:       CBC:  Recent Labs  Lab 04/04/20 1423 04/05/20 0240 04/08/20 0526 04/09/20 0321  WBC 7.7   < > 12.5* 12.7*  NEUTROABS 5.8  --   --   --   HGB 8.9*   < > 10.5* 11.5*  HCT 31.0*   < > 32.6* 35.9*  MCV 76.2*   < > 77.6* 76.7*  PLT 532*   < > 387 404*   < > = values in this interval not displayed.   Basic Metabolic Panel:  Recent Labs  Lab 04/07/20 0055 04/07/20 1133 04/08/20 0526 04/08/20 0651 04/08/20 2140 04/09/20 0321  NA 143   < > 153*   < > 140 138  K 3.4*  --  3.3*  --   --  3.9  CL 119*  --  128*  --   --  109  CO2 15*  --  18*  --   --  19*  GLUCOSE 132*  --  118*  --   --  119*  BUN 7  --  5*  --   --  9  CREATININE 0.62  --  0.58  --   --  0.48  CALCIUM 7.9*  --  7.8*  --   --  8.7*  MG 2.1  --   --   --   --   --    < > = values in this interval not displayed.   Lipid Panel:     Component Value Date/Time   CHOL 210 (H) 03/26/2020 1010   TRIG 132 03/26/2020 1010   HDL 53 03/26/2020 1010   CHOLHDL 4.0 03/26/2020 1010   VLDL 26 03/26/2020 1010   LDLCALC NOT CALCULATED 03/26/2020 1010   HgbA1c:  Lab Results  Component Value Date   HGBA1C 5.6 03/26/2020    IMAGING CT HEAD WO CONTRAST  Result Date: 04/09/2020 CLINICAL DATA:  Stroke follow-up EXAM: CT HEAD WITHOUT CONTRAST TECHNIQUE: Contiguous axial images were obtained from the base of the skull through the vertex  without intravenous contrast. COMPARISON:  Three days ago FINDINGS: Brain: Extensive vasogenic edema pattern in the left posterior frontal to occipital lobes with continued decrease in intra-axial hemorrhage. Midline shift measures 1 cm. No entrapment or interval infarct. Vascular: Stable Skull: Normal. Negative for fracture or focal lesion. Sinuses/Orbits: Negative IMPRESSION: Stable left cerebral edema and decreasing parenchymal hemorrhage. Midline shift is unchanged at 1 cm. Electronically Signed   By: Monte Fantasia M.D.   On: 04/09/2020 05:23    PHYSICAL EXAM  Temp:  [98.1 F (36.7 C)-100.8 F (38.2 C)] 98.2 F (36.8 C) (10/21 0800) Pulse Rate:  [39-47] 44 (10/21 0700) Resp:  [14-22] 15 (10/21 0700) BP: (115-151)/(62-81) 119/62 (10/21 0700) SpO2:  [97 %-100 %]  98 % (10/21 0700)  General - Well nourished, well developed, in no apparent distress.  Ophthalmologic - fundi not visualized due to noncooperation.  Cardiovascular - Regular rhythm and rate.  Respiratory - intermittent but frequent hyperventilation.  Mental Status -  Level of arousal and orientation to month, place, time and person were intact, but not to age. Language including expression, naming, repetition, comprehension was assessed and found intact. Fund of Knowledge was assessed and was intact.  Cranial Nerves II - XII - II - Visual field showed right lower quadrantanopia and upper quadrant decreased visual acuity. III, IV, VI - Extraocular movements intact. V - Facial sensation intact bilaterally. VII - Facial movement intact bilaterally. VIII - Hearing & vestibular intact bilaterally. X - Palate elevates symmetrically. XI - Chin turning & shoulder shrug intact bilaterally. XII - Tongue protrusion intact.  Motor Strength - The patient's strength was normal in all extremities and pronator drift was absent.  Bulk was normal and fasciculations were absent.   Motor Tone - Muscle tone was assessed at the neck and  appendages and was normal.  Reflexes - The patient's reflexes were symmetrical in all extremities and she had no pathological reflexes.  Sensory - Light touch, temperature/pinprick were assessed and were symmetrical.    Coordination - The patient had normal movements in the hands with no ataxia or dysmetria.  Tremor was absent.  Gait and Station - deferred.   ASSESSMENT/PLAN [name] is a 44 y.o. 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 [facility] yesterday (04/03/20) on Keppra, Topamax and Coumadin presenting again to the ED with a chief complaint of headache when she woke up with intermittent worsening. Reports generalized weakness and "faintness", but has no focal deficits. She remains nauseated and has had several episodes of emesis. Pt found to have worsening vasogenic edema in L parietooccipital region and worsening midline shift of 13 mm. Warfarin stopped - Vit K 2.5 mg once - mannitol once - Decadron, hypertonic saline, and Heparin gtt started.   Cerebral Edema  CT head Left parieto-occipital venous infarct with increased edema and mass effect including 13 mm of rightward midline shift. New mild dilatation of the right lateral ventricle suggesting early trapping  Repeat CT improved MLS, cerebral edema and decreased hydrocephalus  CT repeat stable edema, decreasing IPH. 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 Also thrombosis draining cortical vein. 2 areas enhancement in L occipital hemorrhage possible ongoing hemorrhage. Similar edema w/ slight R shift.  CTA and CTV 10/9 - Partial thrombosis of the superior sagittal sinus at the vertex, Thrombosed superficial draining vein  on the left, Hemorrhagic infarction in the left parietal cortical and subcortical brain   Pan CT neg for malignancy, B renal vein nonocclusive thrombosis.  CTV 10/16 Decreased volume of nonocclusive superior sagittal sinus Thrombus. Decreased conspicuity of thrombus within an adjacent left cortical vein.  Repeat CT head improved MLS, cerebral edema and decreased hydrocephalus   2D Echo EF 60-65%. No source of embolus   LE dopplers 10/19 - no DVT   LDL UTC, TC 210, TG 132, direct LDL 151.9  HgbA1c 5.6  UDS +benzos  Hypercoagulable labs unremarkable (anticardiolipin IgM slightly elevated, prot S total and activity mildly low - not accurate on heparin)  VTE prophylaxis - heparin IV    No antithrombotic prior to admission, now on full dose lovenox. On coumadin with INR goal 2-3.    Decadron 4mg  IV q6h ->2mg  IV q 6h -> 2mg  po Q6h -> 2mg  Q8   Therapy recommendations:  Outpt OT, no PT  Disposition:  pending   Seizure   10/6 GTC seizure in ER   S/p ativan and keppra load  Continue keppra 500 bid  LTM EEG - L frontotemporal continuous slowing, B parieto-occipital spikes   Seizure precautions  Severe anemia  Hb 8.7->8.8->8.0->7.2-> 2U PRBC -> 10.7->10.5->11.5  Low MCV and MCH  Iron 13(L), TIBC 552 (h), ferritin 10 (L)  On iron pills  Transfused 2u 10/18  Stool guiac  neg   CBC monitoring  Leukocytosis and fever  Tmax 101.7->afebrile ->100.8  WBC 7.7->5.5->11.4->13.1->12.5->12.7  Dysuria   CXR negative  UA neg, UCx <10k insignificant growth  Blood Cx 10/18 no growth 2d   Rocephin 10/18>>10/20  Elevated Liver Enzynes  Etiology unclear  ? Related to topamax ? - d/c   AST/ ALT 150/262->111/229->76/182->150/241->189/300->144/283 -> 59/229  CT abd/pelvis last admission w/ fatty liver   Hepatitis panel neg  Hyperlipidemia  Home Lipid lowering medication: Lipitor 40 mg daily  LDL LDL UTC, TC 210, TG 132, direct LDL 151.9  lipitor 40 resumed  Continue statin at discharge  OCP use  On OTC 28 for contraception   Not smoker  Recommend to avoid OCP use, especially estrogen use  Syncope 10/15 at home  Could be orthostasis in the setting of anemia  Now anemia improved after PRBC  Orthostatic VS 10/19 Supine BP 134/73, Sitting BP 134/71, Standing BP 130/82  Other Stroke Risk Factors    Other Active Problems  Code status - Full code  Bradycardia - 30s to 40s  Thrombocytosis PLT 532->518->462->422->404   Hypokalemia K 3.7->3.3 ->3.4 ->3.3->3.9  Hospital day # 5  This patient is critically ill due to cerebral venous sinus thrombosis, venous infarct with hemorrhage, bradycardia, cerebral edema, transaminitis, fever with leukocytosis, seizure, severe anemia and at significant risk of neurological worsening, death form brain herniation, ICH, sepsis, status epilepticus, hypovolemic shock. This patient's care requires constant monitoring of vital signs, hemodynamics, respiratory and cardiac monitoring, review of multiple databases, neurological assessment, discussion with family, other specialists and medical decision making of high complexity. I spent 30 minutes of neurocritical care time in the care of this patient. I had long discussion with patient and sister at bedside, updated pt current condition, treatment plan and  potential prognosis, and answered all the questions.  They expressed understanding and appreciation.    Rosalin Hawking, MD PhD Stroke Neurology 04/09/2020 9:25 AM   To contact Stroke Continuity provider, please refer to [email or web address] After hours, contact General Neurology

## 2020-04-09 NOTE — Progress Notes (Signed)
ANTICOAGULATION CONSULT NOTE  Pharmacy Consult for warfarin + Lovenox  Indication: Dural venous sinus thrombosis  No Known Allergies  Patient Measurements: Height: 5' (152.4 cm) Weight: 66.2 kg (145 lb 15.1 oz) IBW/kg (Calculated) : 45.5 Heparin Dosing Weight: 59.7 kg  Vital Signs: Temp: 98.2 F (36.8 C) (10/21 0800) Temp Source: Oral (10/21 0800) BP: 100/80 (10/21 1000) Pulse Rate: 66 (10/21 1000)  Labs: Recent Labs    04/06/20 2242 04/07/20 0055 04/07/20 0602 04/07/20 0602 04/07/20 0850 04/08/20 0526 04/08/20 1450 04/09/20 0321  HGB  --   --  10.7*   < >  --  10.5*  --  11.5*  HCT  --   --  34.1*  --   --  32.6*  --  35.9*  PLT  --   --  422*  --   --  387  --  404*  LABPROT  --   --   --   --   --   --  13.5 14.4  INR  --   --   --   --   --   --  1.1 1.2  HEPARINUNFRC 0.28*  --   --   --  0.28*  --   --   --   CREATININE  --  0.62  --   --   --  0.58  --  0.48   < > = values in this interval not displayed.    Estimated Creatinine Clearance: 75.4 mL/min (by C-G formula based on SCr of 0.48 mg/dL).   Medical History: Past Medical History:  Diagnosis Date  . Intracerebral hemorrhage (HCC)    Assessment: 29 YOF presenting with headache, hx of dural sinus thrombosis on warfarin PTA. INR 1.8 on admission which was reversed with Vit K.   Currently on warfarin with Lovenox bride. INR 1.2 today after 1 dose of warfarin. Patient takes 4mg  daily prior to admit.   Goal of Therapy:  INR goal 2-3 Anti-Xa level 0.6-1 units/ml 4hrs after LMWH dose given Monitor platelets by anticoagulation protocol: Yes   Plan:  Lovenox 65 mg (1 mg/kg) twice daily  Warfarin 6mg  tonight Monitor daily CBC and INR  D/c Lovenox when INR is therapeutic    [name], PharmD., BCPS, BCCCP Clinical Pharmacist Please refer to [facility] for unit-specific pharmacist

## 2020-04-09 NOTE — Progress Notes (Signed)
Physical Therapy Treatment Patient Details Name: Erica Rosario MRN: [id number] DOB: 1975/09/09 Today's Date: 04/09/2020    History of Present Illness Patient is a 44 y/o female who presents on 10/16 with HA, weakness, nausea, vomiting and syncopal episode after being d/ced from [place] on 10/15 with recent sinus venous thrombosis and ICH in the R occipital region. Found to have worsening vasogenic edema in Lft parieto-occipital region and worsening midline shift of 67mm.    PT Comments    Pt reports feeling better daily.  She continues to have mild balance deficits during gait and mobility and keeps her head, neck and trunk rigid during gait to avoid stirring up her dizziness.  Her sister was present assisting with communication.  Pt was able to demonstrate speeding up through automatic doors, but her self selected pace is slow.  She would benefit from DGI next session and practicing some balance poses as she was too symptomatic to continue with balance activities at the end of our walk (recommend doing these first, keeping gait distance low and attempting to complete the DGI next session).   Follow Up Recommendations  No PT follow up     Equipment Recommendations  None recommended by PT    Recommendations for Other Services   NA     Precautions / Restrictions Precautions Precautions: Fall;Other (comment) Precaution Comments: BP <160    Mobility  Bed Mobility Overal bed mobility: Modified Independent             General bed mobility comments: HOB elevated, but likely could have come up to sitting just as easily with bed flat  Transfers Overall transfer level: Needs assistance Equipment used: None Transfers: Sit to/from Stand Sit to Stand: Supervision         General transfer comment: Supervision for safety  Ambulation/Gait Ambulation/Gait assistance: Min guard Gait Distance (Feet): 500 Feet Assistive device: 1 person hand held assist Gait Pattern/deviations:  Step-through pattern;Staggering right;Staggering left Gait velocity: reduced (but able to speed up when asked, reduced 2/2 dizziness) Gait velocity interpretation: 1.31 - 2.62 ft/sec, indicative of limited community ambulator General Gait Details: pt with slow, rigid trunk/neck gait pattern, avoids turning head as it elicits dizziness.  Sister helping initially on opposite side of PT, but not needed when asked to see how she would do without her sister's support.  Min guard assist for balance, she sways, but self corrects without tool much external assistance.    Stairs             Wheelchair Mobility    Modified Rankin (Stroke Patients Only)       Balance Overall balance assessment: Needs assistance Sitting-balance support: Feet supported;No upper extremity supported Sitting balance-Leahy Scale: Good     Standing balance support: No upper extremity supported Standing balance-Leahy Scale: Good Standing balance comment: deficits noted, but supervision for static standing balance and hand washing without support at sink.                            Cognition Arousal/Alertness: Awake/alert Behavior During Therapy: WFL for tasks assessed/performed Overall Cognitive Status: Within Functional Limits for tasks assessed                                        Exercises      General Comments General comments (skin integrity, edema, etc.): Pt's sister  present and assisting in translating.  Pt knows enough English, but at times would get clarification through sister.        Pertinent Vitals/Pain Pain Assessment: No/denies pain    Home Living                      Prior Function            PT Goals (current goals can now be found in the care plan section) Progress towards PT goals: Progressing toward goals    Frequency    Min 4X/week      PT Plan Current plan remains appropriate    Co-evaluation              AM-PAC PT  "6 Clicks" Mobility   Outcome Measure  Help needed turning from your back to your side while in a flat bed without using bedrails?: None Help needed moving from lying on your back to sitting on the side of a flat bed without using bedrails?: None Help needed moving to and from a bed to a chair (including a wheelchair)?: None Help needed standing up from a chair using your arms (e.g., wheelchair or bedside chair)?: None Help needed to walk in hospital room?: A Little Help needed climbing 3-5 steps with a railing? : A Little 6 Click Score: 22    End of Session   Activity Tolerance: Other (comment) (limited by dizziness) Patient left: in bed;with call bell/phone within reach;with family/visitor present   PT Visit Diagnosis: Unsteadiness on feet (R26.81);Other abnormalities of gait and mobility (R26.89);Difficulty in walking, not elsewhere classified (R26.2)     Time: 0017-4944 PT Time Calculation (min) (ACUTE ONLY): 17 min  Charges:  $Gait Training: 8-22 mins                     Verdene Lennert, PT, DPT  Acute Rehabilitation [phone] pager [phone]) [phone] office

## 2020-04-10 DIAGNOSIS — R001 Bradycardia, unspecified: Secondary | ICD-10-CM

## 2020-04-10 DIAGNOSIS — D72829 Elevated white blood cell count, unspecified: Secondary | ICD-10-CM | POA: Diagnosis present

## 2020-04-10 DIAGNOSIS — G936 Cerebral edema: Secondary | ICD-10-CM

## 2020-04-10 DIAGNOSIS — R55 Syncope and collapse: Secondary | ICD-10-CM | POA: Diagnosis present

## 2020-04-10 DIAGNOSIS — D75839 Thrombocytosis, unspecified: Secondary | ICD-10-CM | POA: Diagnosis present

## 2020-04-10 LAB — CBC
HCT: 39.7 % (ref 36.0–46.0)
Hemoglobin: 12.7 g/dL (ref 12.0–15.0)
MCH: 24.7 pg — ABNORMAL LOW (ref 26.0–34.0)
MCHC: 32 g/dL (ref 30.0–36.0)
MCV: 77.2 fL — ABNORMAL LOW (ref 80.0–100.0)
Platelets: 374 10*3/uL (ref 150–400)
RBC: 5.14 MIL/uL — ABNORMAL HIGH (ref 3.87–5.11)
RDW: 21.2 % — ABNORMAL HIGH (ref 11.5–15.5)
WBC: 10.7 10*3/uL — ABNORMAL HIGH (ref 4.0–10.5)
nRBC: 0.4 % — ABNORMAL HIGH (ref 0.0–0.2)

## 2020-04-10 LAB — BASIC METABOLIC PANEL
Anion gap: 8 (ref 5–15)
BUN: 15 mg/dL (ref 6–20)
CO2: 20 mmol/L — ABNORMAL LOW (ref 22–32)
Calcium: 8.6 mg/dL — ABNORMAL LOW (ref 8.9–10.3)
Chloride: 110 mmol/L (ref 98–111)
Creatinine, Ser: 0.61 mg/dL (ref 0.44–1.00)
GFR, Estimated: >60 mL/min (ref >60)
Glucose, Bld: 148 mg/dL — ABNORMAL HIGH (ref 70–99)
Potassium: 3.4 mmol/L — ABNORMAL LOW (ref 3.5–5.1)
Sodium: 138 mmol/L (ref 135–145)

## 2020-04-10 LAB — PROTIME-INR
INR: 1.5 — ABNORMAL HIGH (ref 0.8–1.2)
Prothrombin Time: 17.8 seconds — ABNORMAL HIGH (ref 11.4–15.2)

## 2020-04-10 MED ORDER — POTASSIUM CHLORIDE CRYS ER 20 MEQ PO TBCR
20.0000 meq | EXTENDED_RELEASE_TABLET | ORAL | Status: AC
  Administered 2020-04-10 (×2): 20 meq via ORAL
  Filled 2020-04-10 (×2): qty 1

## 2020-04-10 MED ORDER — DEXAMETHASONE 2 MG PO TABS: 2.0000 mg | ORAL_TABLET | Freq: Two times a day (BID) | ORAL | Status: DC

## 2020-04-10 MED ORDER — WARFARIN SODIUM 6 MG PO TABS
6.0000 mg | ORAL_TABLET | Freq: Once | ORAL | Status: AC
  Administered 2020-04-10: 6 mg via ORAL
  Filled 2020-04-10: qty 1

## 2020-04-10 MED ORDER — DEXAMETHASONE 2 MG PO TABS
2.0000 mg | ORAL_TABLET | Freq: Three times a day (TID) | ORAL | Status: DC
  Administered 2020-04-10 – 2020-04-11 (×2): 2 mg via ORAL
  Filled 2020-04-10 (×2): qty 1

## 2020-04-10 MED ORDER — DEXAMETHASONE 2 MG PO TABS: 2.0000 mg | ORAL_TABLET | Freq: Every day | ORAL | Status: DC

## 2020-04-10 NOTE — Progress Notes (Signed)
STROKE TEAM PROGRESS NOTE   INTERVAL HISTORY Sister at bedside.  Patient sitting in bed, no complains.  Denies headache or dizziness.  Hemoglobin stable.  INR 1.5.  Continue Coumadin with Lovenox bridging.  Patient preferred to stay until Coumadin reach goal before discharge.  OBJECTIVE Vitals:   04/10/20 0007 04/10/20 0415 04/10/20 0743 04/10/20 1116  BP: 118/75 104/69 110/72 106/71  Pulse: (!) 48 (!) 59 (!) 52 69  Resp: 16 16 16 20   Temp: 98.4 F (36.9 C) 98.6 F (37 C) 98 F (36.7 C) 98.1 F (36.7 C)  TempSrc: Oral Oral Oral Oral  SpO2: 98% 98% 99% 100%  Weight:      Height:       CBC:  Recent Labs  Lab 04/04/20 1423 04/05/20 0240 04/09/20 0321 04/10/20 0535  WBC 7.7   < > 12.7* 10.7*  NEUTROABS 5.8  --   --   --   HGB 8.9*   < > 11.5* 12.7  HCT 31.0*   < > 35.9* 39.7  MCV 76.2*   < > 76.7* 77.2*  PLT 532*   < > 404* 374   < > = values in this interval not displayed.   Basic Metabolic Panel:  Recent Labs  Lab 04/07/20 0055 04/07/20 1133 04/09/20 0321 04/09/20 0321 04/09/20 0956 04/10/20 0535  NA 143   < > 138   < > 143 138  K 3.4*   < > 3.9  --   --  3.4*  CL 119*   < > 109  --   --  110  CO2 15*   < > 19*  --   --  20*  GLUCOSE 132*   < > 119*  --   --  148*  BUN 7   < > 9  --   --  15  CREATININE 0.62   < > 0.48  --   --  0.61  CALCIUM 7.9*   < > 8.7*  --   --  8.6*  MG 2.1  --   --   --   --   --    < > = values in this interval not displayed.   Lipid Panel:     Component Value Date/Time   CHOL 210 (H) 03/26/2020 1010   TRIG 132 03/26/2020 1010   HDL 53 03/26/2020 1010   CHOLHDL 4.0 03/26/2020 1010   VLDL 26 03/26/2020 1010   LDLCALC NOT CALCULATED 03/26/2020 1010   HgbA1c:  Lab Results  Component Value Date   HGBA1C 5.6 03/26/2020    IMAGING No results found.    PHYSICAL EXAM  Temp:  [98 F (36.7 C)-98.8 F (37.1 C)] 98.1 F (36.7 C) (10/22 1116) Pulse Rate:  [48-78] 69 (10/22 1116) Resp:  [16-21] 20 (10/22 1116) BP:  (104-118)/(62-75) 106/71 (10/22 1116) SpO2:  [98 %-100 %] 100 % (10/22 1116)  General - Well nourished, well developed, in no apparent distress.  Ophthalmologic - fundi not visualized due to noncooperation.  Cardiovascular - Regular rhythm and rate.  Respiratory - intermittent but frequent hyperventilation.  Mental Status -  Level of arousal and orientation to month, place, time and person were intact, but not to age. Language including expression, naming, repetition, comprehension was assessed and found intact. Fund of Knowledge was assessed and was intact.  Cranial Nerves II - XII - II - Visual field showed right lower quadrantanopia and upper quadrant decreased visual acuity. III, IV, VI - Extraocular movements intact. V -  Facial sensation intact bilaterally. VII - Facial movement intact bilaterally. VIII - Hearing & vestibular intact bilaterally. X - Palate elevates symmetrically. XI - Chin turning & shoulder shrug intact bilaterally. XII - Tongue protrusion intact.  Motor Strength - The patient's strength was normal in all extremities and pronator drift was absent.  Bulk was normal and fasciculations were absent.   Motor Tone - Muscle tone was assessed at the neck and appendages and was normal.  Reflexes - The patient's reflexes were symmetrical in all extremities and she had no pathological reflexes.  Sensory - Light touch, temperature/pinprick were assessed and were symmetrical.    Coordination - The patient had normal movements in the hands with no ataxia or dysmetria.  Tremor was absent.  Gait and Station - deferred.   ASSESSMENT/PLAN Ms. XOEY WARMOTH is a 44 y.o. 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 [facility] yesterday (04/03/20) on Keppra, Topamax and Coumadin presenting again to the ED with a chief complaint of headache when she woke up with intermittent  worsening. Reports generalized weakness and "faintness", but has no focal deficits. She remains nauseated and has had several episodes of emesis. Pt found to have worsening vasogenic edema in L parietooccipital region and worsening midline shift of 13 mm. Warfarin stopped - Vit K 2.5 mg once - mannitol once - Decadron, hypertonic saline, and Heparin gtt started.   Cerebral Edema  CT head Left parieto-occipital venous infarct with increased edema and mass effect including 13 mm of rightward midline shift. New mild dilatation of the right lateral ventricle suggesting early trapping  Repeat CT improved MLS, cerebral edema and decreased hydrocephalus  CT repeat stable edema, decreasing IPH. 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 Also thrombosis draining cortical vein. 2 areas enhancement in L occipital hemorrhage possible ongoing hemorrhage. Similar edema w/ slight R shift.  CTA and CTV 10/9 - Partial thrombosis of the superior sagittal sinus at the vertex, Thrombosed superficial draining vein on the left, Hemorrhagic infarction in the left parietal cortical and subcortical brain   Pan CT neg for malignancy, B renal vein nonocclusive thrombosis.  CTV 10/16  Decreased volume of nonocclusive superior sagittal sinus Thrombus. Decreased conspicuity of thrombus within an adjacent left cortical vein.  Repeat CT head improved MLS, cerebral edema and decreased hydrocephalus   2D Echo EF 60-65%. No source of embolus   LE dopplers 10/19 - no DVT   LDL UTC, TC 210, TG 132, direct LDL 151.9  HgbA1c 5.6  UDS +benzos  Hypercoagulable labs unremarkable (anticardiolipin IgM slightly elevated, prot S total and activity mildly low - not accurate  on heparin)  VTE prophylaxis - heparin IV    No antithrombotic prior to admission, now on full dose lovenox. On coumadin with INR goal 2-3.  Today INR 1.5  Decadron 4mg  IV q6h ->2mg  IV q 6h -> 2mg  po Q6h -> 2mg  Q8   Therapy recommendations:  Outpt OT, no PT - gave pt info for pro bono therapies at [facility] clinic  Disposition:  pending   OP follow up previously arranged - will need to get f/u appt for pt   Center, [place] Follow up on 04/09/2020.   Specialty: General Practice Why: Appointment @ 3:00 PM Contact information: [address] [place] [address] [phone]   Seizure   10/6 GTC seizure in ER   S/p ativan and keppra load  Continue keppra 500 bid  LTM EEG - L frontotemporal continuous slowing, B parieto-occipital spikes   Seizure precautions  Severe anemia  Hb 8.7->8.8->8.0->7.2-> 2U PRBC ->10.5->11.5->12.7   Low MCV and MCH  Iron 13(L), TIBC 552 (h), ferritin 10 (L)  On iron pills  Transfused 2u 10/18  Stool guiac neg   CBC monitoring  Leukocytosis and fever  Tmax 101.7->afebrile ->100.8->afebrile   WBC 11.4->13.1->11.5->10.7  - likely due to steroids  Dysuria   CXR negative  UA neg, UCx <10k insignificant growth  Blood Cx 10/18 no growth 2d   Rocephin 10/18>>10/20  Elevated Liver Enzynes  Etiology unclear  ? Related to topamax ? - d/c   AST/ ALT  150/262->111/229->76/182->150/241->189/300->144/283->59/229->pending  CT abd/pelvis last admission w/ fatty liver   Hepatitis panel neg  Hyperlipidemia  Home Lipid lowering medication: Lipitor 40 mg daily  LDL LDL UTC, TC 210, TG 132, direct LDL 151.9  lipitor 40 resumed  Continue statin at discharge  OCP use  On OTC 28 for contraception   Not smoker  Recommend to avoid OCP use, especially estrogen use  Syncope 10/15 at home  Could be orthostasis in the setting of anemia  Now anemia improved after PRBC  Orthostatic VS 10/19 Supine BP 134/73, Sitting BP 134/71, Standing BP 130/82  Other Stroke Risk Factors    Other Active Problems  Code status - Full code  Bradycardia - 50s to 60s, improved  Thrombocytosis PLT 374 - resolved   Hypokalemia K 3.4 - supplement - repeat in am    Hospital day # 6   Rosalin Hawking, MD PhD Stroke Neurology 04/10/2020 2:52 PM   To contact Stroke Continuity provider, please refer to [email or web address] After hours, contact General Neurology

## 2020-04-10 NOTE — Progress Notes (Signed)
[facility] for warfarin + Lovenox  Indication: Dural venous sinus thrombosis  No Known Allergies  Patient Measurements: Height: 5' (152.4 cm) Weight: 66.2 kg (145 lb 15.1 oz) IBW/kg (Calculated) : 45.5 Heparin Dosing Weight: 59.7 kg  Vital Signs: Temp: 98.1 F (36.7 C) (10/22 1116) Temp Source: Oral (10/22 1116) BP: 106/71 (10/22 1116) Pulse Rate: 69 (10/22 1116)  Labs: Recent Labs    04/08/20 0526 04/08/20 0526 04/08/20 1450 04/09/20 0321 04/10/20 0535  HGB 10.5*   < >  --  11.5* 12.7  HCT 32.6*  --   --  35.9* 39.7  PLT 387  --   --  404* 374  LABPROT  --   --  13.5 14.4 17.8*  INR  --   --  1.1 1.2 1.5*  CREATININE 0.58  --   --  0.48 0.61   < > = values in this interval not displayed.    Estimated Creatinine Clearance: 75.4 mL/min (by C-G formula based on SCr of 0.61 mg/dL).   Medical History: Past Medical History:  Diagnosis Date  . Intracerebral hemorrhage (HCC)    Assessment: 40 YOF presenting with headache, hx of dural sinus thrombosis on warfarin PTA. INR 1.8 on admission which was reversed with Vit K given on 10/16 admit date.  Currently on warfarin with Lovenox bridge. Patient takes 4mg  daily prior to admit. Warfarin resumed 10/20/2 with INR goal 2-3. INR 1.8 on admit 10/16, Vitamin K 2.5mg  po x1 given , INR trend >>1.1>1.2>1.5 today.  No bleeding noted, H/H up to wnl, pltc wnl   Goal of Therapy:  INR goal 2-3 Anti-Xa level 0.6-1 units/ml 4hrs after LMWH dose given Monitor platelets by anticoagulation protocol: Yes   Plan:  Lovenox 65 mg (1 mg/kg) twice daily  Warfarin 6mg  tonight Monitor daily CBC and INR  D/c Lovenox when INR is therapeutic   Nicole Cella, RPh Clinical Pharmacist [phone] Please refer to [facility] for unit-specific pharmacist  04/10/2020 11:59 AM

## 2020-04-10 NOTE — Progress Notes (Signed)
Picc line out 5cm when nurse went to change drsg. Nurse contacted VAST team. Upon assessment. PICC line removed and MD was notified by patient's nurse. Fran Lowes, RN VAST

## 2020-04-10 NOTE — Progress Notes (Signed)
Instructed patient/CG instructed on procedure. HOB less than 45*. Breath held upon line removal. Pressure applied to site after removal and no s/sx of bleeding noted. Applied pressure drsg and instructed CG to notify nurse with any s/sx of bleeding. Instructed to keep drsg CDI for 24*. VU. Fran Lowes, RN VAST

## 2020-04-10 NOTE — TOC Transition Note (Signed)
Transition of Care [facility]) - CM/SW Discharge Note   Patient Details  Name: GAIGE FUSSNER MRN: [id number] Date of Birth: 1976-05-18  [facility] of [facility] CM/SW Contact:  Geralynn Ochs, LCSW Phone Number: 04/10/2020, 12:11 PM   Clinical Narrative:   CSW alerted by PT that there's a pro bono program for therapies at [facility]. CSW faxed orders to the program [phone]). CSW met with patient and sister at bedside, confirmed they understood that referrals were being sent. CSW confirmed that patient's contact information was correct if the program reaches out to her directly, and she also has the number to contact them. CSW placed information on patient's AVS. Patient has 24/7 support at home, no other concerns presented at this time.     Final next level of care: Home/Self Care Barriers to Discharge: Barriers Resolved   Patient Goals and CMS Choice Patient states their goals for this hospitalization and ongoing recovery are:: to get back home CMS Medicare.gov Compare Post Acute Care list provided to:: Patient Choice offered to / list presented to : Patient  Discharge Placement                Patient to be transferred to facility by: Family Name of family member notified: Self, Blanca Patient and family notified of of transfer: 04/10/20  Discharge Plan and Services                                     Social Determinants of Health (SDOH) Interventions     Readmission Risk Interventions No flowsheet data found.

## 2020-04-11 DIAGNOSIS — G08 Intracranial and intraspinal phlebitis and thrombophlebitis: Secondary | ICD-10-CM

## 2020-04-11 LAB — HEPATIC FUNCTION PANEL
ALT: 149 U/L — ABNORMAL HIGH (ref 0–44)
AST: 44 U/L — ABNORMAL HIGH (ref 15–41)
Albumin: 3.3 g/dL — ABNORMAL LOW (ref 3.5–5.0)
Alkaline Phosphatase: 86 U/L (ref 38–126)
Bilirubin, Direct: <0.1 mg/dL (ref 0.0–0.2)
Total Bilirubin: 0.5 mg/dL (ref 0.3–1.2)
Total Protein: 6.6 g/dL (ref 6.5–8.1)

## 2020-04-11 LAB — URINALYSIS, COMPLETE (UACMP) WITH MICROSCOPIC
Bacteria, UA: NONE SEEN
Bilirubin Urine: NEGATIVE
Glucose, UA: NEGATIVE mg/dL
Hgb urine dipstick: NEGATIVE
Ketones, ur: NEGATIVE mg/dL
Leukocytes,Ua: NEGATIVE
Nitrite: NEGATIVE
Protein, ur: NEGATIVE mg/dL
Specific Gravity, Urine: 1.012 (ref 1.005–1.030)
pH: 6 (ref 5.0–8.0)

## 2020-04-11 LAB — CBC
HCT: 41.1 % (ref 36.0–46.0)
Hemoglobin: 13.2 g/dL (ref 12.0–15.0)
MCH: 25 pg — ABNORMAL LOW (ref 26.0–34.0)
MCHC: 32.1 g/dL (ref 30.0–36.0)
MCV: 77.8 fL — ABNORMAL LOW (ref 80.0–100.0)
Platelets: 366 10*3/uL (ref 150–400)
RBC: 5.28 MIL/uL — ABNORMAL HIGH (ref 3.87–5.11)
RDW: 22 % — ABNORMAL HIGH (ref 11.5–15.5)
WBC: 11.4 10*3/uL — ABNORMAL HIGH (ref 4.0–10.5)
nRBC: 0.3 % — ABNORMAL HIGH (ref 0.0–0.2)

## 2020-04-11 LAB — BASIC METABOLIC PANEL
Anion gap: 9 (ref 5–15)
BUN: 14 mg/dL (ref 6–20)
CO2: 21 mmol/L — ABNORMAL LOW (ref 22–32)
Calcium: 9 mg/dL (ref 8.9–10.3)
Chloride: 107 mmol/L (ref 98–111)
Creatinine, Ser: 0.63 mg/dL (ref 0.44–1.00)
GFR, Estimated: >60 mL/min (ref >60)
Glucose, Bld: 111 mg/dL — ABNORMAL HIGH (ref 70–99)
Potassium: 4.3 mmol/L (ref 3.5–5.1)
Sodium: 137 mmol/L (ref 135–145)

## 2020-04-11 LAB — CULTURE, BLOOD (ROUTINE X 2)
Culture: NO GROWTH
Culture: NO GROWTH
Special Requests: ADEQUATE
Special Requests: ADEQUATE

## 2020-04-11 LAB — PROTIME-INR
INR: 1.7 — ABNORMAL HIGH (ref 0.8–1.2)
Prothrombin Time: 19.6 seconds — ABNORMAL HIGH (ref 11.4–15.2)

## 2020-04-11 MED ORDER — WARFARIN SODIUM 5 MG PO TABS: 5.0000 mg | ORAL_TABLET | Freq: Once | ORAL | 1 refills | Status: DC

## 2020-04-11 MED ORDER — DEXAMETHASONE 2 MG PO TABS: 2.0000 mg | ORAL_TABLET | Freq: Two times a day (BID) | ORAL | 0 refills | Status: DC

## 2020-04-11 MED ORDER — FERROUS SULFATE 325 (65 FE) MG PO TABS: 325.0000 mg | ORAL_TABLET | Freq: Three times a day (TID) | ORAL | 3 refills | Status: DC

## 2020-04-11 MED ORDER — ENOXAPARIN SODIUM 80 MG/0.8ML ~~LOC~~ SOLN: 1.0000 mg/kg | Freq: Two times a day (BID) | SUBCUTANEOUS | 1 refills | Status: DC

## 2020-04-11 MED ORDER — WARFARIN SODIUM 5 MG PO TABS
5.0000 mg | ORAL_TABLET | Freq: Once | ORAL | Status: AC
  Administered 2020-04-11: 5 mg via ORAL
  Filled 2020-04-11: qty 1

## 2020-04-11 MED ORDER — DEXAMETHASONE 2 MG PO TABS: 2.0000 mg | ORAL_TABLET | Freq: Every day | ORAL | 0 refills | Status: DC

## 2020-04-11 NOTE — Discharge Summary (Signed)
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 Intracerebral hemorrhage ([place])    History reviewed. No pertinent surgical history.  Family History No family history on file.  Social History  reports that she has never smoked. She has never used smokeless tobacco. She reports previous alcohol use. She reports that she does not use drugs.  Allergies as of 04/11/2020   No Known Allergies     Medication List    STOP taking these medications   acetaminophen 325 MG tablet Commonly known as: TYLENOL   ibuprofen 200 MG tablet Commonly known as: ADVIL   topiramate 25 MG tablet Commonly known as: TOPAMAX     TAKE these medications   atorvastatin 40 MG tablet Commonly known as: LIPITOR Take 1 tablet (40 mg total) by mouth daily.   butalbital-acetaminophen-caffeine 50-325-40 MG tablet Commonly known as: FIORICET Take 1 tablet by mouth every 8 (eight) hours as needed for headache.   dexamethasone 2 MG tablet Commonly known as: DECADRON Take 1 tablet (2 mg total) by mouth every 12 (twelve) hours.   dexamethasone 2 MG tablet Commonly known as: DECADRON Take 1 tablet (2 mg total) by mouth daily. Start taking on: April 14, 2020   enoxaparin 80 MG/0.8ML injection Commonly known as: LOVENOX Inject 0.65 mLs  (65 mg total) into the skin 2 (two) times daily for 4 doses.   ferrous sulfate 325 (65 FE) MG tablet Take 1 tablet (325 mg total) by mouth 3 (three) times daily with meals.   levETIRAcetam 500 MG tablet Commonly known as: KEPPRA Take 1 tablet (500 mg total) by mouth 2 (two) times daily.   pantoprazole 40 MG tablet Commonly known as: PROTONIX Take 1 tablet (40 mg total) by mouth daily.   warfarin 5 MG tablet Commonly known as: COUMADIN Take 1 tablet (5 mg total) by mouth one time only at 4 PM. Take as instructed What changed:   medication strength  how much to take  additional instructions       [facility] Medications Prior to Admission  Medication Sig Dispense Refill  . acetaminophen (TYLENOL) 325 MG tablet Take 2 tablets (650 mg total) by mouth every 4 (four) hours as needed for mild pain (or temp > 37.5 C (99.5 F)).    Marland Kitchen atorvastatin (LIPITOR) 40 MG tablet Take 1 tablet (40 mg total) by mouth daily. 30 tablet 0  . butalbital-acetaminophen-caffeine (FIORICET) 50-325-40 MG tablet Take 1 tablet by mouth every 8 (eight) hours as needed for headache. 14 tablet 0  . ibuprofen (ADVIL) 200 MG tablet Take 400 mg by mouth every 6 (six) hours as needed (for headaches).     . levETIRAcetam (KEPPRA) 500 MG tablet Take 1 tablet (500 mg total) by mouth 2 (two) times daily. 60 tablet 0  .  pantoprazole (PROTONIX) 40 MG tablet Take 1 tablet (40 mg total) by mouth daily. 30 tablet 0  . topiramate (TOPAMAX) 25 MG tablet Take 1 tablet (25 mg total) by mouth 2 (two) times daily. 60 tablet 0  . warfarin (COUMADIN) 4 MG tablet Take 1 tablet (4 mg total) by mouth one time only at 4 PM. (Patient taking differently: Take 4 mg by mouth daily. ) 30 tablet 0     HOSPITAL MEDICATIONS . atorvastatin  40 mg Oral Daily  . chlorhexidine  15 mL Mouth Rinse BID  . Chlorhexidine Gluconate Cloth  6 each Topical Daily  . dexamethasone  2 mg Oral Q8H   Followed by  . dexamethasone  2  mg Oral Q12H   Followed by  . [START ON 04/14/2020] dexamethasone  2 mg Oral Daily  . enoxaparin (LOVENOX) injection  1 mg/kg Subcutaneous BID  . ferrous sulfate  325 mg Oral TID WC  . levETIRAcetam  500 mg Oral BID  . pantoprazole  40 mg Oral Daily  . senna-docusate  1 tablet Oral BID  . sodium chloride flush  10-40 mL Intracatheter Q12H  . warfarin  5 mg Oral ONCE-1600  . Warfarin - Pharmacist Dosing Inpatient   Does not apply q1600    LABORATORY STUDIES CBC    Component Value Date/Time   WBC 11.4 (H) 04/11/2020 0214   RBC 5.28 (H) 04/11/2020 0214   HGB 13.2 04/11/2020 0214   HCT 41.1 04/11/2020 0214   PLT 366 04/11/2020 0214   MCV 77.8 (L) 04/11/2020 0214   MCH 25.0 (L) 04/11/2020 0214   MCHC 32.1 04/11/2020 0214   RDW 22.0 (H) 04/11/2020 0214   LYMPHSABS 1.5 04/04/2020 1423   MONOABS 0.4 04/04/2020 1423   EOSABS 0.0 04/04/2020 1423   BASOSABS 0.1 04/04/2020 1423   CMP    Component Value Date/Time   NA 137 04/11/2020 0214   K 4.3 04/11/2020 0214   CL 107 04/11/2020 0214   CO2 21 (L) 04/11/2020 0214   GLUCOSE 111 (H) 04/11/2020 0214   BUN 14 04/11/2020 0214   CREATININE 0.63 04/11/2020 0214   CALCIUM 9.0 04/11/2020 0214   PROT 6.6 04/11/2020 0214   ALBUMIN 3.3 (L) 04/11/2020 0214   AST 44 (H) 04/11/2020 0214   ALT 149 (H) 04/11/2020 0214   ALKPHOS 86 04/11/2020 0214   BILITOT 0.5 04/11/2020 0214   GFRNONAA >60 04/11/2020 0214   COAGS Lab Results  Component Value Date   INR 1.7 (H) 04/11/2020   INR 1.5 (H) 04/10/2020   INR 1.2 04/09/2020   Lipid Panel    Component Value Date/Time   CHOL 210 (H) 03/26/2020 1010   TRIG 132 03/26/2020 1010   HDL 53 03/26/2020 1010   CHOLHDL 4.0 03/26/2020 1010   VLDL 26 03/26/2020 1010   LDLCALC NOT CALCULATED 03/26/2020 1010   HgbA1C  Lab Results  Component Value Date   HGBA1C 5.6 03/26/2020   Urinalysis    Component Value Date/Time   COLORURINE YELLOW 04/11/2020 1112   APPEARANCEUR CLEAR 04/11/2020 1112    LABSPEC 1.012 04/11/2020 1112   PHURINE 6.0 04/11/2020 1112   GLUCOSEU NEGATIVE 04/11/2020 1112   HGBUR NEGATIVE 04/11/2020 1112   BILIRUBINUR NEGATIVE 04/11/2020 1112   KETONESUR NEGATIVE 04/11/2020 1112   PROTEINUR NEGATIVE 04/11/2020 1112   UROBILINOGEN 0.2 07/02/2010 0959   NITRITE NEGATIVE 04/11/2020 1112   LEUKOCYTESUR NEGATIVE 04/11/2020 1112   Urine Drug Screen     Component Value Date/Time  LABOPIA NONE DETECTED 03/26/2020 1647   COCAINSCRNUR NONE DETECTED 03/26/2020 1647   LABBENZ POSITIVE (A) 03/26/2020 1647   AMPHETMU NONE DETECTED 03/26/2020 1647   THCU NONE DETECTED 03/26/2020 1647   LABBARB NONE DETECTED 03/26/2020 1647    Alcohol Level No results found for: [facility]   SIGNIFICANT DIAGNOSTIC STUDIES CT ANGIO HEAD W OR WO CONTRAST  Result Date: 03/28/2020 CLINICAL DATA:  Follow-up venous sinus thrombosis with left parietal venous infarction. EXAM: CT HEAD WITHOUT CONTRAST CT arteriography and venography OF THE HEAD and neck TECHNIQUE: Contiguous axial images were obtained from the base of the skull through the vertex without intravenous contrast. Multidetector CT imaging of the head was performed using the standard protocol during bolus administration of intravenous contrast. Multiplanar CT image reconstructions and MIPs were obtained to evaluate the vascular anatomy. CONTRAST:  20mL OMNIPAQUE IOHEXOL 350 MG/ML SOLN COMPARISON:  Multiple previous exams most recently 03/25/2020 FINDINGS: CT HEAD Brain: Hemorrhagic infarction in the left parietal cortical and subcortical brain redemonstrated. Low-density is slightly increased, particularly along the anterior margin. Hemorrhagic foci are stable with no new or enlarging bleeds. Mild swelling with mass-effect and 1 or 2 mm of left-to-right shift and flattening of the left lateral ventricle. Vascular: See below Skull: Negative Sinuses/Orbits: Clear/normal CTA neck: Aortic arch is normal. Right common carotid artery is widely patent to  the bifurcation. Normal carotid bifurcation. Normal cervical ICA. Left common carotid artery widely patent to the bifurcation. Carotid bifurcation is normal. Normal left ICA. Both vertebral arteries widely patent at their origins and through the cervical region to the foramen magnum. CTA HEAD Anterior circulation: Both internal carotid arteries widely patent through the skull base and siphon regions. The anterior and middle cerebral vessels are normal without proximal stenosis, aneurysm or vascular malformation. Posterior circulation: Both vertebral arteries widely patent to the basilar. No basilar stenosis. Posterior circulation branch vessels are normal. Venous sinuses: See results of CT venography. Anatomic variants: None significant. CT venography of the head Anterior aspect of the superior sagittal sinus is patent. Again demonstrated is partial thrombosis of the superior sagittal sinus at the vertex. This is not occlusive as flow can be demonstrated around the margins of the thrombus. This is fairly segmental, involving a 5-6 cm segment of the sinus. Beyond that, the distal superior sagittal sinus is patent. Both transverse sinuses are patent. Compared to the initial presentation noncontrast CT and MRI, the thrombus does not appear progressive. Thrombosed superficial draining vein on the left again visible, similar in appearance. IMPRESSION: 1. No arterial pathology in the neck. 2. No intracranial large or medium vessel occlusion or correctable proximal stenosis. 3. Partial thrombosis of the superior sagittal sinus at the vertex, not occlusive as flow can be demonstrated around the margins of the thrombus. This is fairly segmental, involving a 5-6 cm segment of the sinus. Beyond that, the distal superior sagittal sinus is patent. Thrombosed superficial draining vein on the left again visible, similar in appearance. 4. Hemorrhagic infarction in the left parietal cortical and subcortical brain redemonstrated.  Low-density edema is slightly increased, particularly along the anterior margin. No new or enlarging bleeds. Mild swelling with mass-effect and 1 or 2 mm of left-to-right shift and flattening of the left lateral ventricle. Electronically Signed   By: Nelson Chimes M.D.   On: 03/28/2020 07:15   CT HEAD WO CONTRAST  Result Date: 04/09/2020 CLINICAL DATA:  Stroke follow-up EXAM: CT HEAD WITHOUT CONTRAST TECHNIQUE: Contiguous axial images were obtained from the base of the skull  through the vertex without intravenous contrast. COMPARISON:  Three days ago FINDINGS: Brain: Extensive vasogenic edema pattern in the left posterior frontal to occipital lobes with continued decrease in intra-axial hemorrhage. Midline shift measures 1 cm. No entrapment or interval infarct. Vascular: Stable Skull: Normal. Negative for fracture or focal lesion. Sinuses/Orbits: Negative IMPRESSION: Stable left cerebral edema and decreasing parenchymal hemorrhage. Midline shift is unchanged at 1 cm. Electronically Signed   By: Monte Fantasia M.D.   On: 04/09/2020 05:23   CT HEAD WO CONTRAST  Result Date: 04/07/2020 CLINICAL DATA:  44 year old female status post venous infarct of the posterior left hemisphere earlier this month. EXAM: CT HEAD WITHOUT CONTRAST TECHNIQUE: Contiguous axial images were obtained from the base of the skull through the vertex without intravenous contrast. COMPARISON:  Head CT, repeat CT V 04/04/2020. Earlier head CTs 03/28/2020 and prior. FINDINGS: Brain: Confluent hypodensity in the posterior and superior left hemisphere most resembling vasogenic edema is stable since 04/04/2020, although progressed from earlier studies as noted at that time. Areas of discrete hyperdense hemorrhage continue to fade in the posterior left hemisphere (series 3, image 22 today). No increased blood products. Stable associated intracranial mass effect with rightward midline shift of 9-10 mm. Stable mass effect on the ventricles and  mild right lateral ventriculomegaly, although the temporal horn remains relatively decompressed. Basilar cisterns are stable, and on sagittal image 30 today suprasellar cistern patency has mildly improved since 04/04/2020. Stable gray-white matter differentiation elsewhere. No extra-axial or new intracranial hemorrhage. Vascular: Stable intracranial vascular density. Skull: Negative. Sinuses/Orbits: Visualized paranasal sinuses and mastoids are stable and well pneumatized. Other: Orbit and scalp soft tissues remain within normal limits. IMPRESSION: 1. Essentially stable CT appearance of the brain since 04/04/2020. Fairly extensive left hemisphere vasogenic edema with fading intra-axial blood products. Rightward midline shift of 10 mm. Mild right lateral ventriculomegaly. Suprasellar cistern patency has slightly improved. 2. No new intracranial abnormality. Study discussed by telephone with Dr. Leanor Kail on 04/06/2020 at 23:55 . Electronically Signed   By: Genevie Ann M.D.   On: 04/07/2020 00:00   CT Head Wo Contrast  Result Date: 03/25/2020 CLINICAL DATA:  Initial evaluation for acute seizure. EXAM: CT HEAD WITHOUT CONTRAST TECHNIQUE: Contiguous axial images were obtained from the base of the skull through the vertex without intravenous contrast. COMPARISON:  Prior CT from earlier the same day. FINDINGS: Brain: Venous infarction with hemorrhagic conversion involving the left parieto-occipital region again seen, not significantly changed in size and morphology as compared to prior CT from earlier the same day. Surrounding vasogenic edema and regional mass effect is relatively similar. Associated left-to-right shift measures up to 4 mm, unchanged. Partial effacement of the left lateral ventricle without hydrocephalus or trapping, also similar. Basilar cisterns remain patent. No transtentorial herniation. No other acute intracranial hemorrhage or large vessel territory infarct. No visible extra-axial fluid  collection. Vascular: No hyperdense vessel. Skull: Scalp soft tissues and calvarium remain within normal limits. Sinuses/Orbits: Globes and orbital soft tissues demonstrate no new finding.Paranasal sinuses and mastoid air cells remain clear. Other: None. IMPRESSION: 1. No significant interval change in size and morphology of left parieto-occipital lobe venous infarction with hemorrhagic conversion. Surrounding vasogenic edema and regional mass effect with 4 mm of left-to-right shift, unchanged. No hydrocephalus or trapping. 2. No other new acute intracranial abnormality. Electronically Signed   By: Jeannine Boga M.D.   On: 03/25/2020 22:51   CT HEAD WO CONTRAST  Result Date: 03/25/2020 CLINICAL DATA:  Headaches and neck stiffness.  Follow-up hemorrhage. EXAM: CT HEAD WITHOUT CONTRAST TECHNIQUE: Contiguous axial images were obtained from the base of the skull through the vertex without intravenous contrast. COMPARISON:  Earlier same day FINDINGS: Brain: Venous infarction with hemorrhagic transformation in the left parietal region shows slightly more edema. The majority of the foci of internal hemorrhage are unchanged. There is a single new focus of hemorrhage at the vertex as marked by an arrow measuring 7 mm. The others appear stable. Mild swelling and mass effect no midline shift. No new area of infarction identified. No hydrocephalus. No evidence of subarachnoid extension. Vascular: Somewhat hyperdense superior sagittal sinus consistent with thrombosis shown by MRI. Skull: Negative Sinuses/Orbits: Clear/normal Other: None IMPRESSION: 1. Venous infarction with hemorrhagic transformation in the left parietal region shows slightly more edema. The majority of the foci of internal hemorrhage are unchanged. There is a single new focus of hemorrhage at the vertex as marked by an arrow measuring 7 mm. The others appear stable. 2. Somewhat hyperdense superior sagittal sinus consistent with thrombosis shown by  MRI. Electronically Signed   By: Nelson Chimes M.D.   On: 03/25/2020 20:28   CT Head Wo Contrast  Result Date: 03/25/2020 CLINICAL DATA:  Three day history of headache and visual disturbance. EXAM: CT HEAD WITHOUT CONTRAST TECHNIQUE: Contiguous axial images were obtained from the base of the skull through the vertex without intravenous contrast. COMPARISON:  None. FINDINGS: Brain: There is a hemorrhagic process in the left posterior parietal/upper occipital region with moderate surrounding edema which appears to extend all the way to the cortex on the coronal images. This could be a hemorrhagic infarction, an AVM, a hypertensive process or less likely PRES syndrome. Recommend MRI brain without and with contrast for further evaluation. No significant mass effect or midline shift. No extra-axial fluid collections are identified. The right cerebral hemisphere appears normal. The brainstem and cerebellum are unremarkable. Vascular: No vascular calcifications or hyperdense vessels. Skull: No skull fracture or bone lesions. Sinuses/Orbits: The paranasal sinuses and mastoid air cells are clear. The globes are intact. Other: No scalp lesions or scalp hematoma. IMPRESSION: 1. Hemorrhagic process in the left posterior parietal region with moderate surrounding edema. This could be a hemorrhagic infarction, an AVM, a hypertensive process or less likely PRES syndrome. Recommend MRI brain without and with contrast for further evaluation. 2. No significant mass effect or midline shift. These results were called by telephone at the time of interpretation on 03/25/2020 at 1:16 pm to provider [facility] , who verbally acknowledged these results. Electronically Signed   By: Marijo Sanes M.D.   On: 03/25/2020 13:16   CT ANGIO NECK W OR WO CONTRAST  Result Date: 03/28/2020 CLINICAL DATA:  Follow-up venous sinus thrombosis with left parietal venous infarction. EXAM: CT HEAD WITHOUT CONTRAST CT arteriography and venography OF THE  HEAD and neck TECHNIQUE: Contiguous axial images were obtained from the base of the skull through the vertex without intravenous contrast. Multidetector CT imaging of the head was performed using the standard protocol during bolus administration of intravenous contrast. Multiplanar CT image reconstructions and MIPs were obtained to evaluate the vascular anatomy. CONTRAST:  27mL OMNIPAQUE IOHEXOL 350 MG/ML SOLN COMPARISON:  Multiple previous exams most recently 03/25/2020 FINDINGS: CT HEAD Brain: Hemorrhagic infarction in the left parietal cortical and subcortical brain redemonstrated. Low-density is slightly increased, particularly along the anterior margin. Hemorrhagic foci are stable with no new or enlarging bleeds. Mild swelling with mass-effect and 1 or 2 mm of left-to-right shift and flattening  of the left lateral ventricle. Vascular: See below Skull: Negative Sinuses/Orbits: Clear/normal CTA neck: Aortic arch is normal. Right common carotid artery is widely patent to the bifurcation. Normal carotid bifurcation. Normal cervical ICA. Left common carotid artery widely patent to the bifurcation. Carotid bifurcation is normal. Normal left ICA. Both vertebral arteries widely patent at their origins and through the cervical region to the foramen magnum. CTA HEAD Anterior circulation: Both internal carotid arteries widely patent through the skull base and siphon regions. The anterior and middle cerebral vessels are normal without proximal stenosis, aneurysm or vascular malformation. Posterior circulation: Both vertebral arteries widely patent to the basilar. No basilar stenosis. Posterior circulation branch vessels are normal. Venous sinuses: See results of CT venography. Anatomic variants: None significant. CT venography of the head Anterior aspect of the superior sagittal sinus is patent. Again demonstrated is partial thrombosis of the superior sagittal sinus at the vertex. This is not occlusive as flow can be  demonstrated around the margins of the thrombus. This is fairly segmental, involving a 5-6 cm segment of the sinus. Beyond that, the distal superior sagittal sinus is patent. Both transverse sinuses are patent. Compared to the initial presentation noncontrast CT and MRI, the thrombus does not appear progressive. Thrombosed superficial draining vein on the left again visible, similar in appearance. IMPRESSION: 1. No arterial pathology in the neck. 2. No intracranial large or medium vessel occlusion or correctable proximal stenosis. 3. Partial thrombosis of the superior sagittal sinus at the vertex, not occlusive as flow can be demonstrated around the margins of the thrombus. This is fairly segmental, involving a 5-6 cm segment of the sinus. Beyond that, the distal superior sagittal sinus is patent. Thrombosed superficial draining vein on the left again visible, similar in appearance. 4. Hemorrhagic infarction in the left parietal cortical and subcortical brain redemonstrated. Low-density edema is slightly increased, particularly along the anterior margin. No new or enlarging bleeds. Mild swelling with mass-effect and 1 or 2 mm of left-to-right shift and flattening of the left lateral ventricle. Electronically Signed   By: Nelson Chimes M.D.   On: 03/28/2020 07:15   MR Brain W and Wo Contrast  Result Date: 03/25/2020 CLINICAL DATA:  Brain mass or lesion. EXAM: MRI HEAD WITHOUT AND WITH CONTRAST TECHNIQUE: Multiplanar, multiecho pulse sequences of the brain and surrounding structures were obtained without and with intravenous contrast. CONTRAST:  36mL GADAVIST GADOBUTROL 1 MMOL/ML IV SOLN COMPARISON:  None. FINDINGS: Brain: Redemonstrated left occipital hemorrhage. Similar surrounding edema with local mass effect and mild rightward midline shift. No evidence of underlying mass lesion although acute blood products limit evaluation. There is a rounded area of arterial enhancement with in the hemorrhage centrally (see  series 18, image 109) and superiorly within the hemorrhage (series 18, image 131) which may represent active/ongoing hemorrhage. On postcontrast imaging there is evidence of dural sinus thrombosis within the superior sagittal sinus posteriorly (for example see series 18, images 138 through 151) with thrombosis of a draining cortical vein in the region of hemorrhage (for example see series 18, images 123 through 136). No hydrocephalus. Major arterial flow voids are maintained at the skull base. Skull and upper cervical spine: Normal marrow signal. Sinuses/Orbits: Negative. Other: No mastoid effusions. IMPRESSION: 1. Thrombosis of the posterior aspect of the superior sagittal sinus with hemorrhagic transformation of a venous infarct in the left occipital lobe, as detailed above. There is thrombosis of a draining cortical vein in the region of hemorrhage, as detailed above. 2. There are two  rounded areas of enhancement within the left occipital hemorrhage which may represent active/ongoing hemorrhage. 3. When compared to same day CT, similar surrounding edema/mass effect with slight rightward midline shift. Critical Value/emergent results were called by telephone at the time of interpretation on 03/25/2020 at 2:38 pm to provider [facility] , who verbally acknowledged these results. Electronically Signed   By: Margaretha Sheffield MD   On: 03/25/2020 14:51   CT CHEST ABDOMEN PELVIS W CONTRAST  Result Date: 03/27/2020 CLINICAL DATA:  Left parietooccipital lobe venous infarct. EXAM: CT CHEST, ABDOMEN, AND PELVIS WITH CONTRAST TECHNIQUE: Multidetector CT imaging of the chest, abdomen and pelvis was performed following the standard protocol during bolus administration of intravenous contrast. CONTRAST:  164mL OMNIPAQUE IOHEXOL 300 MG/ML  SOLN COMPARISON:  None. FINDINGS: CT CHEST FINDINGS Cardiovascular: The heart size is normal. No substantial pericardial effusion. No thoracic aortic aneurysm. Mediastinum/Nodes: No  mediastinal lymphadenopathy. There is no hilar lymphadenopathy. The esophagus has normal imaging features. There is no axillary lymphadenopathy. Lungs/Pleura: Dependent basilar atelectasis noted in both lower lobes. Somewhat confluent opacity in the left lower lobe on 77/7 is probably atelectatic although component of infectious/inflammatory airspace disease not entirely excluded. No pleural effusion. Musculoskeletal: No worrisome lytic or sclerotic osseous abnormality. CT ABDOMEN PELVIS FINDINGS Hepatobiliary: 11 mm hypervascular lesion noted inferior right liver (54/3). Geographic fatty deposition noted in the liver parenchyma with some sparing along the gallbladder fossa. There is no evidence for gallstones, gallbladder wall thickening, or pericholecystic fluid. No intrahepatic or extrahepatic biliary dilation. Pancreas: No focal mass lesion. No dilatation of the main duct. No intraparenchymal cyst. No peripancreatic edema. Spleen: No splenomegaly. No focal mass lesion. Adrenals/Urinary Tract: No adrenal nodule or mass. Kidneys unremarkable. No evidence for hydroureter. The urinary bladder appears normal for the degree of distention. Stomach/Bowel: Stomach is unremarkable. No gastric wall thickening. No evidence of outlet obstruction. Duodenum is normally positioned as is the ligament of Treitz. No small bowel wall thickening. No small bowel dilatation. The terminal ileum is normal. The appendix is normal. No gross colonic mass. No colonic wall thickening. Vascular/Lymphatic: No abdominal aortic aneurysm. No abdominal aortic atherosclerotic calcification. The portal vein, superior mesenteric vein and splenic vein are patent. Nonocclusive filling defects are identified in both left renal veins, visible on axial image 59/3 and confirmed on renal delay image 16/8. Both defects are well demonstrated on coronal imaging (see left renal vein on 69/5 and right renal vein on 74/5). Reproductive: Uterus appears  heterogeneous with probable posterior left subserosal fibroid. Uterus measures 4.8 x 11.9 x 6.5 cm. There is no adnexal mass. Other: No intraperitoneal free fluid. Musculoskeletal: No worrisome lytic or sclerotic osseous abnormality. IMPRESSION: 1. Nonocclusive thrombus identified in both the left and right renal veins. 2. 11 mm hypervascular lesion noted inferior right liver. This is probably benign and may be a a flash filling hemangioma or vascular malformation. Abdominal MRI without and with contrast recommended to further evaluate. 3. Dependent basilar atelectasis in both lower lobes. Somewhat confluent opacity in the left lower lobe is probably atelectatic although component of infectious/inflammatory airspace disease not entirely excluded. 4. Hepatic steatosis. Electronically Signed   By: Misty Stanley M.D.   On: 03/27/2020 07:25   DG CHEST PORT 1 VIEW  Result Date: 04/07/2020 CLINICAL DATA:  43 year old female with shortness of breath.  Fever. EXAM: PORTABLE CHEST 1 VIEW COMPARISON:  Portable chest yesterday. FINDINGS: Portable AP semi upright view at 0027 hours. Mildly lower lung volumes. Mediastinal contours remain normal. Visualized tracheal  air column is within normal limits. New right upper extremity approach PICC line placed. Tip is at the cavoatrial junction level. Allowing for portable technique both lungs remain clear. No pneumothorax. No osseous abnormality identified.  Paucity of bowel gas. IMPRESSION: Right PICC line placed.  No acute cardiopulmonary abnormality. Electronically Signed   By: Genevie Ann M.D.   On: 04/07/2020 00:58   DG CHEST PORT 1 VIEW  Result Date: 04/06/2020 CLINICAL DATA:  Fever EXAM: PORTABLE CHEST 1 VIEW COMPARISON:  03/26/2020 FINDINGS: The heart size and mediastinal contours are within normal limits. Both lungs are clear. The visualized skeletal structures are unremarkable. IMPRESSION: No active disease. Electronically Signed   By: Kathreen Devoid   On: 04/06/2020 09:38    Chest Port 1 View  Result Date: 03/26/2020 CLINICAL DATA:  Aspiration pneumonia EXAM: PORTABLE CHEST 1 VIEW COMPARISON:  None. FINDINGS: The heart size and mediastinal contours are within normal limits. Hazy airspace opacity seen at the left lung base. There is streaky airspace opacity seen within the right mid lung. The visualized skeletal structures are unremarkable. IMPRESSION: Hazy airspace opacity at the left lung base, likely atelectasis and/or infectious etiology. Electronically Signed   By: Prudencio Pair M.D.   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 EEG to evaluate for seizure. Level of alertness: awake, asleep AEDs during EEG study: LEV Technical aspects: This EEG study was done with scalp electrodes positioned according to the 10-20 International system of electrode placement. Electrical activity was acquired at a sampling rate of 500Hz  and reviewed with a high frequency filter of 70Hz  and a low frequency filter of 1Hz . EEG data were recorded continuously and digitally stored. Description: EEG showed posterior dominant rhythm of 9 Hz activity of moderate voltage (25-35 uV) seen predominantly in posterior head regions, symmetric and reactive to eye opening and eye closing. Sleep was characterized by vertex waves, sleep spindles (12-14hz ), maximal frontocentral region. EEG also showed continuous 3-6Hz  theta- delta slowing in left frontotemporal region. Spikes were also seen in bilateral parieto-occipital region. Hyperventilation and photic stimulation were not performed.   ABNORMALITY - Continuous slow, left frontotemporal - Spikes,  bilateral parieto-occipital region IMPRESSION: This study showed evidence of epileptogenicity arising from bilateral parieto-occipital region as well as cortical dysfunction in left frontotemporal region likely secondary to underlying hemorrhage. No seizures were seen during this study. Lora Havens   ECHOCARDIOGRAM COMPLETE  Result Date: 03/26/2020    ECHOCARDIOGRAM REPORT   Patient Name:   LAREE GARRON Date of Exam: 03/26/2020 Medical Rec #:  [id number]       Height:       59.1 in Accession #:    [id number]      Weight:       140.0 lb Date of Birth:  May 21, 1976      BSA:          1.586 m Patient Age:    43 years        BP:           113/59 mmHg Patient Gender: F               HR:  77 bpm. Exam Location:  Inpatient Procedure: 2D Echo Indications:    Stroke I163.9  History:        Patient has no prior history of Echocardiogram examinations.                 Intracerebral Hemorrhage.  Sonographer:    Mikki Santee RDCS (AE) Referring Phys: [id number] [place]  1. Left ventricular ejection fraction, by estimation, is 60 to 65%. The left ventricle has normal function. The left ventricle has no regional wall motion abnormalities. Left ventricular diastolic parameters were normal.  2. Right ventricular systolic function is normal. The right ventricular size is normal. There is normal pulmonary artery systolic pressure. The estimated right ventricular systolic pressure is 32.2 mmHg.  3. The mitral valve is normal in structure. No evidence of mitral valve regurgitation. No evidence of mitral stenosis.  4. The aortic valve is tricuspid. Aortic valve regurgitation is not visualized. No aortic stenosis is present.  5. The inferior vena cava is dilated in size with >50% respiratory variability, suggesting right atrial pressure of 8 mmHg. FINDINGS  Left Ventricle: Left ventricular ejection fraction, by estimation, is 60 to 65%. The left ventricle has normal function. The left ventricle has no  regional wall motion abnormalities. The left ventricular internal cavity size was normal in size. There is  no left ventricular hypertrophy. Left ventricular diastolic parameters were normal. Right Ventricle: The right ventricular size is normal. No increase in right ventricular wall thickness. Right ventricular systolic function is normal. There is normal pulmonary artery systolic pressure. The tricuspid regurgitant velocity is 2.01 m/s, and  with an assumed right atrial pressure of 8 mmHg, the estimated right ventricular systolic pressure is 02.5 mmHg. Left Atrium: Left atrial size was normal in size. Right Atrium: Right atrial size was normal in size. Pericardium: There is no evidence of pericardial effusion. Mitral Valve: The mitral valve is normal in structure. No evidence of mitral valve regurgitation. No evidence of mitral valve stenosis. Tricuspid Valve: The tricuspid valve is normal in structure. Tricuspid valve regurgitation is trivial. Aortic Valve: The aortic valve is tricuspid. Aortic valve regurgitation is not visualized. No aortic stenosis is present. Pulmonic Valve: The pulmonic valve was normal in structure. Pulmonic valve regurgitation is not visualized. Aorta: The aortic root is normal in size and structure. Venous: The inferior vena cava is dilated in size with greater than 50% respiratory variability, suggesting right atrial pressure of 8 mmHg. IAS/Shunts: No atrial level shunt detected by color flow Doppler.  LEFT VENTRICLE PLAX 2D LVIDd:         4.50 cm  Diastology LVIDs:         3.10 cm  LV e' medial:    12.20 cm/s LV PW:         0.90 cm  LV E/e' medial:  6.3 LV IVS:        0.70 cm  LV e' lateral:   8.59 cm/s LVOT diam:     1.90 cm  LV E/e' lateral: 8.9 LV SV:         65 LV SV Index:   41 LVOT Area:     2.84 cm  RIGHT VENTRICLE RV S prime:     13.40 cm/s TAPSE (M-mode): 1.7 cm LEFT ATRIUM             Index       RIGHT ATRIUM           Index LA diam:  3.40 cm 2.14 cm/m  RA Area:      11.80 cm LA Vol (A2C):   49.1 ml 30.96 ml/m RA Volume:   23.10 ml  14.57 ml/m LA Vol (A4C):   32.1 ml 20.24 ml/m LA Biplane Vol: 41.8 ml 26.36 ml/m  AORTIC VALVE LVOT Vmax:   123.00 cm/s LVOT Vmean:  86.500 cm/s LVOT VTI:    0.230 m  AORTA Ao Root diam: 2.70 cm MITRAL VALVE               TRICUSPID VALVE MV Area (PHT): 2.34 cm    TR Peak grad:   16.2 mmHg MV Decel Time: 324 msec    TR Vmax:        201.00 cm/s MV E velocity: 76.50 cm/s MV A velocity: 44.10 cm/s  SHUNTS MV E/A ratio:  1.73        Systemic VTI:  0.23 m                            Systemic Diam: 1.90 cm Loralie Champagne MD Electronically signed by Loralie Champagne MD Signature Date/Time: 03/26/2020/5:01:38 PM    Final    CT VENOGRAM HEAD  Result Date: 04/04/2020 CLINICAL DATA:  Headache, weakness, nausea, and vomiting today. Recent venous thrombosis and infarct. EXAM: CT VENOGRAM HEAD TECHNIQUE: Contiguous axial images were obtained from the base of the skull through the vertex without intravenous contrast. Multidetector CT imaging of the head was performed using the standard protocol during bolus administration of intravenous contrast. Multiplanar CT image reconstructions and MIPs were obtained to evaluate the vascular anatomy. CONTRAST:  24mL OMNIPAQUE IOHEXOL 300 MG/ML  SOLN COMPARISON:  03/28/2020 FINDINGS: CT HEAD Brain: Extensive edema in the left parietal and occipital lobes related to the known venous infarct has increased. There is increased regional mass effect with 13 mm of rightward midline shift, and there is new mild dilatation of the right lateral ventricle including of the temporal horn. Multifocal hemorrhage within the infarct has decreased in density, and no new hemorrhage is identified. No contralateral infarct is identified. There is no extra-axial fluid collection. Vascular: Evaluated below. Skull: No fracture or suspicious osseous lesion. Sinuses: Visualized paranasal sinuses and mastoid air cells are clear. Orbits: Unremarkable.  CTV HEAD Nonocclusive thrombus in the midportion of the superior sagittal sinus has decreased in volume. The superior sagittal sinus remains patent without new thrombus identified. Thrombus within an adjacent left-sided cortical vein is less conspicuous than on the prior study. The internal cerebral veins, vein of Galen, straight sinus, transverse sinuses, sigmoid sinuses, and jugular bulbs remain patent without evidence of thrombus. There is scattered non masslike enhancement within the venous infarct. IMPRESSION: 1. Left parieto-occipital venous infarct with increased edema and mass effect including 13 mm of rightward midline shift. New mild dilatation of the right lateral ventricle suggesting early trapping. 2. Decreased volume of nonocclusive superior sagittal sinus thrombus. 3. Decreased conspicuity of thrombus within an adjacent left cortical vein. 4. No new venous thrombus identified. The study was discussed by telephone with Dr. Lorrin Goodell on 04/04/2020 at 5:55 p.m. Electronically Signed   By: Logan Bores M.D.   On: 04/04/2020 18:13   CT VENOGRAM HEAD  Result Date: 03/28/2020 CLINICAL DATA:  Follow-up venous sinus thrombosis with left parietal venous infarction. EXAM: CT HEAD WITHOUT CONTRAST CT arteriography and venography OF THE HEAD and neck TECHNIQUE: Contiguous axial images were obtained from the base of the skull through the vertex  without intravenous contrast. Multidetector CT imaging of the head was performed using the standard protocol during bolus administration of intravenous contrast. Multiplanar CT image reconstructions and MIPs were obtained to evaluate the vascular anatomy. CONTRAST:  66mL OMNIPAQUE IOHEXOL 350 MG/ML SOLN COMPARISON:  Multiple previous exams most recently 03/25/2020 FINDINGS: CT HEAD Brain: Hemorrhagic infarction in the left parietal cortical and subcortical brain redemonstrated. Low-density is slightly increased, particularly along the anterior margin. Hemorrhagic foci  are stable with no new or enlarging bleeds. Mild swelling with mass-effect and 1 or 2 mm of left-to-right shift and flattening of the left lateral ventricle. Vascular: See below Skull: Negative Sinuses/Orbits: Clear/normal CTA neck: Aortic arch is normal. Right common carotid artery is widely patent to the bifurcation. Normal carotid bifurcation. Normal cervical ICA. Left common carotid artery widely patent to the bifurcation. Carotid bifurcation is normal. Normal left ICA. Both vertebral arteries widely patent at their origins and through the cervical region to the foramen magnum. CTA HEAD Anterior circulation: Both internal carotid arteries widely patent through the skull base and siphon regions. The anterior and middle cerebral vessels are normal without proximal stenosis, aneurysm or vascular malformation. Posterior circulation: Both vertebral arteries widely patent to the basilar. No basilar stenosis. Posterior circulation branch vessels are normal. Venous sinuses: See results of CT venography. Anatomic variants: None significant. CT venography of the head Anterior aspect of the superior sagittal sinus is patent. Again demonstrated is partial thrombosis of the superior sagittal sinus at the vertex. This is not occlusive as flow can be demonstrated around the margins of the thrombus. This is fairly segmental, involving a 5-6 cm segment of the sinus. Beyond that, the distal superior sagittal sinus is patent. Both transverse sinuses are patent. Compared to the initial presentation noncontrast CT and MRI, the thrombus does not appear progressive. Thrombosed superficial draining vein on the left again visible, similar in appearance. IMPRESSION: 1. No arterial pathology in the neck. 2. No intracranial large or medium vessel occlusion or correctable proximal stenosis. 3. Partial thrombosis of the superior sagittal sinus at the vertex, not occlusive as flow can be demonstrated around the margins of the thrombus. This  is fairly segmental, involving a 5-6 cm segment of the sinus. Beyond that, the distal superior sagittal sinus is patent. Thrombosed superficial draining vein on the left again visible, similar in appearance. 4. Hemorrhagic infarction in the left parietal cortical and subcortical brain redemonstrated. Low-density edema is slightly increased, particularly along the anterior margin. No new or enlarging bleeds. Mild swelling with mass-effect and 1 or 2 mm of left-to-right shift and flattening of the left lateral ventricle. Electronically Signed   By: Nelson Chimes M.D.   On: 03/28/2020 07:15   VAS [place] LOWER EXTREMITY VENOUS (DVT)  Result Date: 04/07/2020  Lower Venous DVTStudy Indications: Edema.  Risk Factors: Thrombus ICH. Performing Technologist: Griffin Basil RCT RDMS  Examination Guidelines: A complete evaluation includes B-mode imaging, spectral Doppler, color Doppler, and power Doppler as needed of all accessible portions of each vessel. Bilateral testing is considered an integral part of a complete examination. Limited examinations for reoccurring indications may be performed as noted. The reflux portion of the exam is performed with the patient in reverse Trendelenburg.  +---------+---------------+---------+-----------+----------+--------------+ RIGHT    CompressibilityPhasicitySpontaneityPropertiesThrombus Aging +---------+---------------+---------+-----------+----------+--------------+ CFV      Full           Yes      Yes                                 +---------+---------------+---------+-----------+----------+--------------+  SFJ      Full                                                        +---------+---------------+---------+-----------+----------+--------------+ FV Prox  Full                                                        +---------+---------------+---------+-----------+----------+--------------+ FV Mid   Full                                                         +---------+---------------+---------+-----------+----------+--------------+ FV DistalFull                                                        +---------+---------------+---------+-----------+----------+--------------+ PFV      Full                                                        +---------+---------------+---------+-----------+----------+--------------+ POP      Full           Yes      Yes                                 +---------+---------------+---------+-----------+----------+--------------+ PTV      Full                                                        +---------+---------------+---------+-----------+----------+--------------+ PERO     Full                                                        +---------+---------------+---------+-----------+----------+--------------+   +---------+---------------+---------+-----------+----------+--------------+ LEFT     CompressibilityPhasicitySpontaneityPropertiesThrombus Aging +---------+---------------+---------+-----------+----------+--------------+ CFV      Full           Yes      Yes                                 +---------+---------------+---------+-----------+----------+--------------+ SFJ      Full                                                        +---------+---------------+---------+-----------+----------+--------------+  FV Prox  Full                                                        +---------+---------------+---------+-----------+----------+--------------+ FV Mid   Full                                                        +---------+---------------+---------+-----------+----------+--------------+ FV DistalFull                                                        +---------+---------------+---------+-----------+----------+--------------+ PFV      Full                                                         +---------+---------------+---------+-----------+----------+--------------+ POP      Full           Yes      Yes                                 +---------+---------------+---------+-----------+----------+--------------+ PTV      Full                                                        +---------+---------------+---------+-----------+----------+--------------+ PERO     Full                                                        +---------+---------------+---------+-----------+----------+--------------+     Summary: RIGHT: - There is no evidence of deep vein thrombosis in the lower extremity.  - No cystic structure found in the popliteal fossa.  LEFT: - There is no evidence of deep vein thrombosis in the lower extremity.  - No cystic structure found in the popliteal fossa.  *See table(s) above for measurements and observations. Electronically signed by Curt Jews MD on 04/07/2020 at 4:24:42 PM.    Final    [place] EKG SITE RITE  Result Date: 04/06/2020 If Site Rite image not attached, placement could not be confirmed due to current cardiac rhythm.       HISTORY OF PRESENT ILLNESS (From Dr Bartholome Bill H&P 04/04/20)  KAROLEE MELONI is an 44 y.o. 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 She is now again presenting to the ED with a chief complaint of headache when she woke up this morning with intermittent  worsening headache she describes as aching around the front and left side of her head: 10 out of 10.Reports generalized weakness and "faintness", but has no focal deficits. She remains nauseated and has had several episodes of emesis. No injury or trauma. Denies vision changes, but has trouble with vision exam, mainly on right it is most difficult,yet no clear field cut(this was noted a week ago on neuro exam). She reports difficulty in concentration, esp  with numbers as she cannot give correct age or do simple math calculation. History obtained via medical interpreter in [place]. Husband at bedside and corroborates the history. Impression and Key exam findings: 10 F with recent sinus venous thrombosis and ICH in the R occipital regino p/w worsening L headache and found to have worsening vasogenic edema in L parietooccipital region and worsening midline shift of 15mm. STAT HTS at 75cc/hr, Mannitol, decadron. CTV with decrease in the size of thrombus. Plan: Monitor in the ICU, continue seizure meds and topamax for headache, stop warfarin and ordered Vit K 2.5mg  once. Start Heparin gtt, call NSGY for change in GCS of 2 or more.   HOSPITAL COURSE Ms. MARTA BOUIE is a 44 y.o. female with history of R parieto-occipitalICHas a result ofdural sinus thrombosis (03/25/20) - discharged home from [facility] yesterday (04/03/20) on Keppra, Topamax and Coumadin presenting again to the ED with a chief complaint of headache when she woke up withintermittentworsening. Reports generalized weakness and "faintness", but has no focal deficits. She remains nauseated and hashad several episodes of emesis. Pt found to have worsening vasogenic edema in L parietooccipital region and worsening midline shift of 13 mm. Warfarin stopped - Vit K 2.5 mg once - mannitol once - Decadron, hypertonic saline, and Heparin gtt started.   Cerebral Edema  CT head Left parieto-occipital venous infarct with increased edema and mass effect including 13 mm of rightward midline shift. New mild dilatation of the right lateral ventricle suggesting early trapping  Repeat CT improved MLS, cerebral edema and decreased hydrocephalus  CT repeat stable edema, decreasing IPH. 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 hemorrhagic infarct w/ surrounded edema  MRI Superior sagittal sinus thrombosis w/ hemorrhagic transformation L occipital venous infarct. Also thrombosis draining cortical vein. 2 areas enhancement in L occipital hemorrhage possible ongoing hemorrhage. Similar edema w/ slight R shift.  CTA and CTV 10/9 - Partial thrombosis of the superior sagittal sinus at the vertex, Thrombosed superficial draining vein on the left, Hemorrhagic infarction in the left parietal cortical and subcortical brain   Pan CTneg formalignancy, B renal vein nonocclusive thrombosis.  CTV 10/16 Decreased volume of nonocclusive superior sagittal sinus Thrombus. Decreased conspicuity of thrombus within an adjacent left cortical vein.  Repeat CT head improved MLS, cerebral edema and decreased hydrocephalus   2D EchoEF60-65%. No  source of embolus  LE dopplers 10/19 - no DVT   LDLUTC, TC 210, TG 132, direct LDL151.9  HgbA1c5.6  UDS+benzos  Hypercoagulable labs unremarkable(anticardiolipin IgM slightly elevated, prot S total and activitymildlylow -not accurateon heparin)  VTE prophylaxis -heparin IV   No antithromboticprior to admission, now on full dose lovenox. On coumadin with INR goal 2-3.  04/11/20 - INR 1.7  Decadron 4mg  IV q6h ->2mg  IV q 6h -> 2mg  po Q6h -> 2mg  Q8   Therapy recommendations: Outpt OT, no PT - gave pt info for pro bono therapies at [facility] clinic  Disposition: Discharged to home with early followup  OP follow up  previously arranged - will need to get f/u appt for pt   [facility] Follow up on 04/09/2020.  Specialty: General Practice Why: Appointment @ 3:00 PM Contact information: [address]. [place] [address] [phone]   Seizure   10/6 GTCseizurein ER   S/p ativan and keppra load  Continue keppra 500 bid  LTM EEG -L frontotemporal continuous slowing, B parieto-occipital spikes  Seizure precautions  Severe anemia  Hb 8.7->8.8->8.0->7.2-> 2U PRBC ->10.5->11.5->12.7->13.2  Low MCV and MCH  Iron 13(L), TIBC 552 (h), ferritin 10 (L)  On iron pills  Transfused 2u 10/18  Stool guiac neg   CBC monitoring  Leukocytosis and fever  Tmax 101.7->afebrile ->100.8->afebrile   WBC 11.4->13.1->11.5->10.7  - likely due to steroids - 11.4  Dysuria   CXR negative  UA neg, UCx <10k insignificant growth  Blood Cx 10/18 no growth 2d   Rocephin 10/18>>10/20  Elevated Liver Enzynes  Etiology unclear  ? Related to topamax ? - d/c   AST/ ALT 150/262->111/229->76/182->150/241->189/300->144/283->59/229-> 44/147 (continues to improve)  CT abd/pelvis last admission w/ fatty liver   Hepatitis panel neg  Hyperlipidemia  Home Lipid lowering medication: Lipitor 40 mg daily  LDL LDLUTC, TC 210, TG 132, directLDL151.9  lipitor 40 resumed  Continue statin at discharge  OCP use  On OTC 28 for contraception   Not smoker  Recommend to avoid OCP use, especially estrogen use  Syncope 10/15 at home  Could be orthostasis in the setting of anemia  Now anemia improved after PRBC  Orthostatic VS 10/19 Supine BP 134/73, Sitting BP 134/71, Standing BP 130/82   Possibly due to bradycardia  Anticoagulation   Indication- Superior sagittal sinus thrombosis - INR goal 2.0 - 3.0  Warfarin - started 04/08/20 - Dosing and pt education per pharmacy. INR 1.7 on 04/11/20 (5 mg tablets disp #30 with 1 refill) (RXs  printed)  Lovenox - bridging - 65 mg [place] Q 12 hrs until warfarin is therapeutic. 4 doses ordered with (1) refill for 4 additional doses if needed.   F/U - Pt to go to the [facility] Monday 10/25 for a Protime blood test and CBC for further adjustment of warfarin and possible discontinuation of Lovenox  Per Pharmacist's Note 04/11/20  Lovenox 65 mg (1 mg/kg) twice daily   Warfarin 5mg  tonight  Monitor daily CBC and INR   D/c Lovenox when INR is therapeutic   Other Active Problems  Code status - Full code  Bradycardia - 50s to 60s, - 40's to 50's  Thrombocytosis PLT 374 - resolved - 366  Hypokalemia K 3.4 - supplement - repeat in am  - 4.3   DISCHARGE EXAM Vitals:   04/10/20 2043 04/11/20 0051 04/11/20 0430 04/11/20 0919  BP: 120/60 106/69 105/75 107/70  Pulse: (!) 51 (!)  51 (!) 49 77  Resp: 16 16 16 17   Temp: 98.5 F (36.9 C) 98 F (36.7 C) 98.4 F (36.9 C) 98 F (36.7 C)  TempSrc: Oral Oral Oral Oral  SpO2: 99% 99% 98% 99%  Weight:      Height:       General - Well nourished, well developed, in no apparent distress.  Ophthalmologic - fundi not visualized due to noncooperation.  Cardiovascular - Regular rhythm and rate.  Respiratory - intermittent but frequent hyperventilation.  Mental Status -  Level of arousal and orientation to month, place, time and person, and age Language including expression, naming, repetition, comprehension was assessed and found intact. Fund of Knowledge was assessed and was intact.  Cranial Nerves II - XII - II - Visual field showed right lower quadrantanopia and upper quadrant decreased visual acuity. III, IV, VI - Extraocular movements intact. V - Facial sensation intact bilaterally. VII - Facial movement intact bilaterally. VIII - Hearing & vestibular intact bilaterally. X - Palate elevates symmetrically. XI - Chin turning & shoulder shrug intact bilaterally. XII - Tongue protrusion  intact.  Motor Strength - The patient's strength was normal in all extremities and pronator drift was absent.  Bulk was normal and fasciculations were absent.   Motor Tone - Muscle tone was assessed at the neck and appendages and was normal.  Reflexes - The patient's reflexes were symmetrical in all extremities and she had no pathological reflexes.  Sensory - Light touch, temperature/pinprick were assessed and were symmetrical.    Coordination - The patient had normal movements in the hands with no ataxia or dysmetria.  Tremor was absent.  Gait and Station - deferred.  DISCHARGE INSTRUCTIONS GIVEN TO THE PATIENT and Son 1. You need to have a protime blood test at the clinic on Monday for further adjustment of your medication.  2. Use the Lovenox injection twice daily every 12 hours until instructed to stop. 3. Report any abnormal bleeding immediately   DISCHARGE DIET   Diet Order            Diet regular Room service appropriate? Yes with Assist; Fluid consistency: Thin  Diet effective now                liquids  DISCHARGE PLAN  Disposition: Discharged to home  warfarin daily and Lovenox for secondary stroke prevention.  Ongoing risk factor control by Primary Care Physician at time of discharge  [place], [place] Monday 10/ 25/21  Follow-up in [facility] in 4 weeks, office to schedule an appointment.   40 minutes were spent preparing discharge.  Mikey Bussing PA-C [facility] Pager [phone] 04/11/2020, 3:10 PM

## 2020-04-11 NOTE — Progress Notes (Signed)
[place] for warfarin + Lovenox  Indication: Dural venous sinus thrombosis  No Known Allergies  Patient Measurements: Height: 5' (152.4 cm) Weight: 66.2 kg (145 lb 15.1 oz) IBW/kg (Calculated) : 45.5 Heparin Dosing Weight: 59.7 kg  Vital Signs: Temp: 98.4 F (36.9 C) (10/23 0430) Temp Source: Oral (10/23 0430) BP: 105/75 (10/23 0430) Pulse Rate: 49 (10/23 0430)  Labs: Recent Labs    04/09/20 0321 04/09/20 0321 04/10/20 0535 04/11/20 0214  HGB 11.5*   < > 12.7 13.2  HCT 35.9*  --  39.7 41.1  PLT 404*  --  374 366  LABPROT 14.4  --  17.8* 19.6*  INR 1.2  --  1.5* 1.7*  CREATININE 0.48  --  0.61 0.63   < > = values in this interval not displayed.    Estimated Creatinine Clearance: 75.4 mL/min (by C-G formula based on SCr of 0.63 mg/dL).   Medical History: Past Medical History:  Diagnosis Date   Intracerebral hemorrhage ([place])    Assessment: 26 YOF presenting with headache, hx of dural sinus thrombosis on warfarin PTA. INR 1.8 on admission which was reversed with Vit K given on 10/16 admit date.  Currently on warfarin with Lovenox bridge. Patient takes 4mg  daily prior to admit. Warfarin resumed 10/20/2 with INR goal 2-3.  INR 1.8 on admit 10/16, Vitamin K 2.5mg  po x1 given , INR trend >>1.1>1.2>1.5> 1.7 today. Steady INR increase, will decrease warfarin dose slightly in anticipation of INR increase due to 6mg  tablets. No bleeding noted, H/H up to wnl, pltc wnl   Goal of Therapy:  INR goal 2-3 Anti-Xa level 0.6-1 units/ml 4hrs after LMWH dose given Monitor platelets by anticoagulation protocol: Yes   Plan:  Lovenox 65 mg (1 mg/kg) twice daily  Warfarin 5mg  tonight Monitor daily CBC and INR  D/c Lovenox when INR is therapeutic   Dimple Nanas, PharmD PGY-1 Acute Care Pharmacy Resident Office: [phone] 04/11/2020 7:30 AM

## 2020-04-11 NOTE — Progress Notes (Signed)
STROKE TEAM PROGRESS NOTE   INTERVAL HISTORY  Family is at bedside. Patient willing to perform Lovenox for 3-4 days. Discussed she will need to call pcp Monday to get INR checked and if >=2 then stop Lovenox. Discussed with nursing who will contact case management. Also consulted pharmacy on lovenox teaching and dosing and Warfarin dosing. She is doing extremely well. Wants to go home.    OBJECTIVE Vitals:   04/10/20 2043 04/11/20 0051 04/11/20 0430 04/11/20 0919  BP: 120/60 106/69 105/75 107/70  Pulse: (!) 51 (!) 51 (!) 49 77  Resp: 16 16 16 17   Temp: 98.5 F (36.9 C) 98 F (36.7 C) 98.4 F (36.9 C) 98 F (36.7 C)  TempSrc: Oral Oral Oral Oral  SpO2: 99% 99% 98% 99%  Weight:      Height:       CBC:  Recent Labs  Lab 04/04/20 1423 04/05/20 0240 04/10/20 0535 04/11/20 0214  WBC 7.7   < > 10.7* 11.4*  NEUTROABS 5.8  --   --   --   HGB 8.9*   < > 12.7 13.2  HCT 31.0*   < > 39.7 41.1  MCV 76.2*   < > 77.2* 77.8*  PLT 532*   < > 374 366   < > = values in this interval not displayed.   Basic Metabolic Panel:  Recent Labs  Lab 04/07/20 0055 04/07/20 1133 04/10/20 0535 04/11/20 0214  NA 143   < > 138 137  K 3.4*   < > 3.4* 4.3  CL 119*   < > 110 107  CO2 15*   < > 20* 21*  GLUCOSE 132*   < > 148* 111*  BUN 7   < > 15 14  CREATININE 0.62   < > 0.61 0.63  CALCIUM 7.9*   < > 8.6* 9.0  MG 2.1  --   --   --    < > = values in this interval not displayed.   Lipid Panel:     Component Value Date/Time   CHOL 210 (H) 03/26/2020 1010   TRIG 132 03/26/2020 1010   HDL 53 03/26/2020 1010   CHOLHDL 4.0 03/26/2020 1010   VLDL 26 03/26/2020 1010   LDLCALC NOT CALCULATED 03/26/2020 1010   HgbA1c:  Lab Results  Component Value Date   HGBA1C 5.6 03/26/2020    IMAGING No results found.    PHYSICAL EXAM  Temp:  [98 F (36.7 C)-98.5 F (36.9 C)] 98 F (36.7 C) (10/23 0919) Pulse Rate:  [49-77] 77 (10/23 0919) Resp:  [16-20] 17 (10/23 0919) BP: (105-120)/(60-75)  107/70 (10/23 0919) SpO2:  [98 %-100 %] 99 % (10/23 0919)  General - Well nourished, well developed, in no apparent distress.  Ophthalmologic - fundi not visualized due to noncooperation.  Cardiovascular - Regular rhythm and rate.  Respiratory - intermittent but frequent hyperventilation.  Mental Status -  Level of arousal and orientation to month, place, time and person, and age Language including expression, naming, repetition, comprehension was assessed and found intact. Fund of Knowledge was assessed and was intact.  Cranial Nerves II - XII - II - Visual field showed right lower quadrantanopia and upper quadrant decreased visual acuity. III, IV, VI - Extraocular movements intact. V - Facial sensation intact bilaterally. VII - Facial movement intact bilaterally. VIII - Hearing & vestibular intact bilaterally. X - Palate elevates symmetrically. XI - Chin turning & shoulder shrug intact bilaterally. XII - Tongue protrusion intact.  Motor Strength - The patient's strength was normal in all extremities and pronator drift was absent.  Bulk was normal and fasciculations were absent.   Motor Tone - Muscle tone was assessed at the neck and appendages and was normal.  Reflexes - The patient's reflexes were symmetrical in all extremities and she had no pathological reflexes.  Sensory - Light touch, temperature/pinprick were assessed and were symmetrical.    Coordination - The patient had normal movements in the hands with no ataxia or dysmetria.  Tremor was absent.  Gait and Station - deferred.   ASSESSMENT/PLAN [name] is a 44 y.o. 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 yesterday (04/03/20) on Keppra, Topamax and Coumadin presenting again to the ED with a chief complaint of headache when she woke up with intermittent worsening. Reports generalized weakness  and "faintness", but has no focal deficits. She remains nauseated and has had several episodes of emesis. Pt found to have worsening vasogenic edema in L parietooccipital region and worsening midline shift of 13 mm. Warfarin stopped - Vit K 2.5 mg once - mannitol once - Decadron, hypertonic saline, and Heparin gtt started.   Cerebral Edema  CT head Left parieto-occipital venous infarct with increased edema and mass effect including 13 mm of rightward midline shift. New mild dilatation of the right lateral ventricle suggesting early trapping  Repeat CT improved MLS, cerebral edema and decreased hydrocephalus  CT repeat stable edema, decreasing IPH. 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 Also thrombosis draining cortical vein. 2 areas enhancement in L occipital hemorrhage possible ongoing hemorrhage. Similar edema w/ slight R shift.  CTA and CTV 10/9 - Partial thrombosis of the superior sagittal sinus at the vertex, Thrombosed superficial draining vein on the left, Hemorrhagic infarction in the left parietal cortical and subcortical brain   Pan CT neg for malignancy, B renal vein nonocclusive thrombosis.  CTV 10/16 Decreased volume of nonocclusive  superior sagittal sinus Thrombus. Decreased conspicuity of thrombus within an adjacent left cortical vein.  Repeat CT head improved MLS, cerebral edema and decreased hydrocephalus   2D Echo EF 60-65%. No source of embolus   LE dopplers 10/19 - no DVT   LDL UTC, TC 210, TG 132, direct LDL 151.9  HgbA1c 5.6  UDS +benzos  Hypercoagulable labs unremarkable (anticardiolipin IgM slightly elevated, prot S total and activity mildly low - not accurate on heparin)  VTE prophylaxis - heparin IV    No antithrombotic prior to admission, now on full dose lovenox. On coumadin with INR goal 2-3.  04/11/20 - INR 1.7  Decadron 4mg  IV q6h ->  2mg  IV q 6h -> 2mg  po Q6h -> 2mg  Q8   Therapy recommendations:  Outpt OT, no PT - gave pt info for pro bono therapies at [facility] clinic  Disposition:  pending   OP follow up previously arranged - will need to get f/u appt for pt   [facility] [place] Follow up on 04/09/2020.   Specialty: General Practice Why: Appointment @ 3:00 PM Contact information: [address]. [place] [address] [phone]   Seizure   10/6 GTC seizure in ER   S/p ativan and keppra load  Continue keppra 500 bid  LTM EEG - L frontotemporal continuous slowing, B parieto-occipital spikes   Seizure precautions  Severe anemia  Hb 8.7->8.8->8.0->7.2-> 2U PRBC ->10.5->11.5->12.7->13.2  Low MCV and MCH  Iron 13(L), TIBC 552 (h), ferritin 10 (L)  On iron pills  Transfused 2u 10/18  Stool guiac neg   CBC monitoring  Leukocytosis and fever  Tmax 101.7->afebrile ->100.8->afebrile   WBC 11.4->13.1->11.5->10.7  - likely due to steroids - 11.4  Dysuria   CXR negative  UA neg, UCx <10k insignificant growth  Blood Cx 10/18 no growth 2d   Rocephin 10/18>>10/20  Elevated Liver Enzynes  Etiology unclear  ? Related to topamax ? - d/c   AST/ ALT 150/262->111/229->76/182->150/241->189/300->144/283->59/229-> 44/147 (continues  to improve)  CT abd/pelvis last admission w/ fatty liver   Hepatitis panel neg  Hyperlipidemia  Home Lipid lowering medication: Lipitor 40 mg daily  LDL LDL UTC, TC 210, TG 132, direct LDL 151.9  lipitor 40 resumed  Continue statin at discharge  OCP use  On OTC 28 for contraception   Not smoker  Recommend to avoid OCP use, especially estrogen use  Syncope 10/15 at home  Could be orthostasis in the setting of anemia  Now anemia improved after PRBC  Orthostatic VS 10/19 Supine BP 134/73, Sitting BP 134/71, Standing BP 130/82  Other Stroke Risk Factors    Other Active Problems  Code status - Full code  Bradycardia - 50s to 60s, - 40's to 50's  Thrombocytosis PLT 374 - resolved - 366  Hypokalemia K 3.4 - supplement - repeat in am  - 4.3  Hospital day # 7  Personally examined patient and images, and have participated in and made any corrections needed to history, physical, neuro exam,assessment and plan as stated above.  I have personally obtained the history, evaluated lab date, reviewed imaging studies and agree with radiology interpretations.    Sarina Ill, MD Stroke Neurology  I spent 25 minutes of face-to-face and non-face-to-face time with patient. This included prechart review, lab review, study review, order entry, electronic health record documentation, patient education on the different diagnostic and therapeutic options, counseling and coordination of care, risks and benefits of management, compliance, or risk factor reduction    To contact Stroke Continuity provider, please refer to [email or web address] After hours, contact General Neurology

## 2020-04-11 NOTE — Discharge Instructions (Signed)
1. You need to have a protime blood test at the clinic on Monday for further adjustment of your medication.   2. Use the Lovenox injection twice daily every 12 hours until instructed to stop.  3. Report any abnormal bleeding immediatley  Information on my medicine - Coumadin   (Warfarin) You were taking Coumadin (Warfarin ) prior to this admission for dural sinus thrombosis (blood clot).   Why was Coumadin prescribed for you? Coumadin was prescribed for you because you have a blood clot or a medical condition that can cause an increased risk of forming blood clots. Blood clots can cause serious health problems by blocking the flow of blood to the heart, lung, or brain. Coumadin can prevent harmful blood clots from forming. As a reminder your indication for Coumadin is:   Deep Vein Thrombosis Treatment  dural sinus thrombosis (blood clot)  What test will check on my response to Coumadin? While on Coumadin (warfarin) you will need to have an INR test regularly to ensure that your dose is keeping you in the desired range. The INR (international normalized ratio) number is calculated from the result of the laboratory test called prothrombin time (PT).  If an INR APPOINTMENT HAS NOT ALREADY BEEN MADE FOR YOU please schedule an appointment to have this lab work done by your health care provider within 7 days. Your INR goal is usually a number between:  2 to 3 or your provider may give you a more narrow range like 2-2.5.  Ask your health care provider during an office visit what your goal INR is.  What  do you need to  know  About  COUMADIN? Take Coumadin (warfarin) exactly as prescribed by your healthcare provider about the same time each day.  DO NOT stop taking without talking to the doctor who prescribed the medication.  Stopping without other blood clot prevention medication to take the place of Coumadin may increase your risk of developing a new clot or stroke.  Get refills before you run  out.  What do you do if you miss a dose? If you miss a dose, take it as soon as you remember on the same day then continue your regularly scheduled regimen the next day.  Do not take two doses of Coumadin at the same time.  Important Safety Information A possible side effect of Coumadin (Warfarin) is an increased risk of bleeding. You should call your healthcare provider right away if you experience any of the following: ? Bleeding from an injury or your nose that does not stop. ? Unusual colored urine (red or dark brown) or unusual colored stools (red or black). ? Unusual bruising for unknown reasons. ? A serious fall or if you hit your head (even if there is no bleeding).  Some foods or medicines interact with Coumadin (warfarin) and might alter your response to warfarin. To help avoid this: ? Eat a balanced diet, maintaining a consistent amount of Vitamin K. ? Notify your provider about major diet changes you plan to make. ? Avoid alcohol or limit your intake to 1 drink for women and 2 drinks for men per day. (1 drink is 5 oz. wine, 12 oz. beer, or 1.5 oz. liquor.)  Make sure that ANY health care provider who prescribes medication for you knows that you are taking Coumadin (warfarin).  Also make sure the healthcare provider who is monitoring your Coumadin knows when you have started a new medication including herbals and non-prescription products.  Coumadin (Warfarin)  Major Drug Interactions  Increased Warfarin Effect Decreased Warfarin Effect  Alcohol (large quantities) Antibiotics (esp. Septra/Bactrim, Flagyl, Cipro) Amiodarone (Cordarone) Aspirin (ASA) Cimetidine (Tagamet) Megestrol (Megace) NSAIDs (ibuprofen, naproxen, etc.) Piroxicam (Feldene) Propafenone (Rythmol SR) Propranolol (Inderal) Isoniazid (INH) Posaconazole (Noxafil) Barbiturates (Phenobarbital) Carbamazepine (Tegretol) Chlordiazepoxide (Librium) Cholestyramine (Questran) Griseofulvin Oral  Contraceptives Rifampin Sucralfate (Carafate) Vitamin K   Coumadin (Warfarin) Major Herbal Interactions  Increased Warfarin Effect Decreased Warfarin Effect  Garlic Ginseng Ginkgo biloba Coenzyme Q10 Green tea St. Johns wort    Coumadin (Warfarin) FOOD Interactions  Eat a consistent number of servings per week of foods HIGH in Vitamin K (1 serving =  cup)  Collards (cooked, or boiled & drained) Kale (cooked, or boiled & drained) Mustard greens (cooked, or boiled & drained) Parsley *serving size only =  cup Spinach (cooked, or boiled & drained) Swiss chard (cooked, or boiled & drained) Turnip greens (cooked, or boiled & drained)  Eat a consistent number of servings per week of foods MEDIUM-HIGH in Vitamin K (1 serving = 1 cup)  Asparagus (cooked, or boiled & drained) Broccoli (cooked, boiled & drained, or raw & chopped) Brussel sprouts (cooked, or boiled & drained) *serving size only =  cup Lettuce, raw (green leaf, endive, romaine) Spinach, raw Turnip greens, raw & chopped   These websites have more information on Coumadin (warfarin):  FailFactory.se; VeganReport.com.au;

## 2020-04-11 NOTE — Progress Notes (Signed)
Patient ready for discharge to home; brother at bedside to assist patient home; discharge instructions given and reviewed; Rx's given(paper copies).  Patient administered first dose of coumadin 5mg  po for today's administration prior to discharge.  Teach back with Lovenox injection preformed this am; brother present for review with patient.  Patient discharged out via wheelchair.

## 2020-04-11 NOTE — TOC Transition Note (Signed)
Transition of Care [facility]) - CM/SW Discharge Note   Patient Details  Name: JERZEE JEROME MRN: [id number] Date of Birth: Jan 28, 1976  Transition of Care [facility] CM/SW Contact:  Carles Collet, RN Phone Number: 04/11/2020, 3:19 PM   Clinical Narrative:    Patient with paper scripts for new meds. Reinstated MATCH and provided with [place] letter, patient understands to take scripts to participating pharmacy.     Final next level of care: Home/Self Care Barriers to Discharge: Barriers Resolved   Patient Goals and CMS Choice Patient states their goals for this hospitalization and ongoing recovery are:: to get back home CMS Medicare.gov Compare Post Acute Care list provided to:: Patient Choice offered to / list presented to : Patient  Discharge Placement                Patient to be transferred to facility by: Family Name of family member notified: Self, Blanca Patient and family notified of of transfer: 04/10/20  Discharge Plan and Services                                     Social Determinants of Health (SDOH) Interventions     Readmission Risk Interventions No flowsheet data found.

## 2020-04-12 LAB — URINE CULTURE: Culture: NO GROWTH

## 2020-05-12 ENCOUNTER — Other Ambulatory Visit: Payer: Self-pay

## 2020-05-12 ENCOUNTER — Ambulatory Visit: Payer: Medicaid Other | Admitting: Adult Health

## 2020-05-12 ENCOUNTER — Encounter: Payer: Self-pay | Admitting: Adult Health

## 2020-05-12 VITALS — BP 114/68 | HR 78 | Wt 135.0 lb

## 2020-05-12 DIAGNOSIS — R569 Unspecified convulsions: Secondary | ICD-10-CM

## 2020-05-12 DIAGNOSIS — I636 Cerebral infarction due to cerebral venous thrombosis, nonpyogenic: Secondary | ICD-10-CM

## 2020-05-12 DIAGNOSIS — I69398 Other sequelae of cerebral infarction: Secondary | ICD-10-CM

## 2020-05-12 DIAGNOSIS — H547 Unspecified visual loss: Secondary | ICD-10-CM

## 2020-05-12 DIAGNOSIS — I639 Cerebral infarction, unspecified: Secondary | ICD-10-CM

## 2020-05-12 DIAGNOSIS — E785 Hyperlipidemia, unspecified: Secondary | ICD-10-CM

## 2020-05-12 NOTE — Patient Instructions (Signed)
Consider doing therapy once insurance approved  May consider evaluation by eye doctor after a couple months to further evaluate your vision loss  Continue warfarin daily  and atorvastatin  for secondary stroke prevention  We will plan on repeating imaging to monitor the clot in your brain in 2-3 months. You will be called to schedule  Continue to follow up with PCP regarding cholesterol management and routine monitoring of your INR levels  Maintain strict control of cholesterol with LDL cholesterol (bad cholesterol) goal below 70 mg/dL.      Followup in the future with me in 4 months or call earlier if needed      Thank you for coming to see [place] at [facility]. I hope we have been able to provide you high quality care today.  You may receive a patient satisfaction survey over the next few weeks. We would appreciate your feedback and comments so that we may continue to improve ourselves and the health of our patients.

## 2020-05-12 NOTE — Progress Notes (Signed)
[facility] [address] [place]. [place] [address] [phone]       HOSPITAL FOLLOW UP NOTE  [name] Date of Birth:  Feb 26, 1976 Medical Record Number:  [id number]   Reason for Referral:  hospital stroke follow up    SUBJECTIVE:   CHIEF COMPLAINT:  Chief Complaint  Patient presents with  . Hospitalization Follow-up    tx rm, With brother, c/o numbness in right arm, c/o blurry vision     HPI:   [name] is a 44 y.o. female with no PMH  who presented to [facility] with HA and neck stiffness on 03/25/2020.   Personally reviewed hospitalization pertinent progress notes, lab work and imaging with summary provided.  Shortly transferred to [facility] and evaluated by Dr. 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  Repeat CT head showed hemorrhagic transformation L parietal with slight increase edema with most hemorrhagic foci unchanged and single new hemorrhage at vertex.  Repeat CT head 3 hours later stable. Pan CT negative for malignancy but did show B renal vein nonocclusive thrombosis. CTV and CTA h/n negative LVO with partial thrombosis of the superior sagittal sinus at the vertex, nonocclusive as well can be demonstrated around the margins of the thrombus with patency beyond that.  Also showed thrombosed superficial draining vein of the left again visible, similar in appearance with low density edema slightly increased and mild swelling with mass-effect and 1 to 2 mm of left-to-right shift and flattening of the left lateral ventricle.  2D echo normal EF without cardiac source of embolus identified.  Questionable hypercoagulable state with hypercoagulable showed anticardiolipin IgM 19 (indeterminate) and protein S total and active mildly low but likely inaccurate on heparin.  Advise to discontinue OCP  use.  Placed on heparin IV and warfarin daily. GTC in ER s/p Ativan and Keppra load and initiated Keppra 500 mg twice daily with LTM showing left frontal temporal continuous slowing and bilateral parieto-occipital spikes.  LDL UTC with direct LDL of 151 no statin PTA and initiated atorvastatin 40 mg daily.  No prior history of HTN or DM.  Other stroke risk factors include history of EtOH use and overweight but no prior stroke history.  Other active problems include anemia, leukocytosis and hypokalemia.  Evaluated by therapies and recommended discharge to CIR for ongoing therapy needs.  She was discharged to CIR on 03/31/2020 and discharged home on 04/03/2020 on Keppra, Topamax and warfarin.  She unfortunately returned on 04/04/2020 with complaint of headache, generalized weakness and "faintness" as well as nauseated and several episodes of emesis but no focal deficits noted.  She was found to have worsening vasogenic edema in left parieto-occipital region and worsening midline shift of 13 mm.  Warfarin discontinued and reversed with vitamin K.  Administering mannitolx1, Decadron, hypertonic saline and heparin drip.  Repeat CT head showed improved MLS, cerebral edema and decreased hydrocephalus and again repeat CT head showed stable edema with decreasing IPH and same 1 cm midline shift. NSG on board PRN.  Also found to have severe anemia receiving 2U PRBC with improvement potentially contributing to syncopal event on 10/15 at home.  Elevated liver enzymes of unclear etiology possibly related to Topamax therefore DC'd.  Restarted warfarin with INR goal 2-3 in addition to Lovenox for total of 4 doses.  Advised to follow-up with [place] on 10/25  for pro time blood test and CBC as well as INR levels and adjustment of warfarin as indicated and possible discontinuation of Lovenox.  Discharged on dexamethasone.  Reevaluated by therapy recommended outpatient OT and no PT needs.  She was  discharged home on 04/11/2020.   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 hemorrhagic infarct w/ surrounded edema  MRI Superior sagittal sinus thrombosis w/ hemorrhagic transformation L occipital venous infarct. Also thrombosis draining cortical vein. 2 areas enhancement in L occipital hemorrhage possible ongoing hemorrhage. Similar edema w/ slight R shift.  CTA and CTV 10/9 - Partial thrombosis of the superior sagittal sinus at the vertex, Thrombosed superficial draining vein on the left, Hemorrhagic infarction in the left parietal cortical and subcortical brain   Pan CTneg formalignancy, B renal vein nonocclusive thrombosis.  CTV 10/16 Decreased volume of nonocclusive superior sagittal sinus Thrombus. Decreased conspicuity of thrombus within an adjacent left cortical vein.  Repeat CT head improved MLS, cerebral edema and decreased hydrocephalus   2D EchoEF60-65%. No source of embolus  LE dopplers 10/19 - no DVT   LDLUTC, TC 210, TG 132, direct LDL151.9  HgbA1c5.6  UDS+benzos  Hypercoagulable labs unremarkable(anticardiolipin IgM slightly elevated, prot S total and activitymildlylow -not accurateon heparin)  VTE prophylaxis -heparin IV   No antithromboticprior to admission, now on full dose lovenox. On coumadin with INR goal 2-3.  Today INR 1.5  Decadron 4mg  IV q6h ->2mg  IV q 6h -> 2mg  po Q6h -> 2mg  Q8   Therapy recommendations: Outpt OT, no PT - gave pt info for pro bono therapies at [facility]  Disposition: home  Cerebral Edema  CT head Left parieto-occipital venous infarct with increased edema and mass effect including 13 mm of rightward midline shift. New mild dilatation of the right lateral ventricle suggesting early trapping  Repeat CT improved MLS, cerebral edema and decreased  hydrocephalus  CT repeat stable edema, decreasing IPH. Same 1cm midline shift   NSG on board PRN  S/p mannitol x 1  Decadron 10mg  x 1 -> 4mg  Q6 -> 2mg  Q6 ->2mg  Q8 -> tapering  On 3% at 75 -> 40 cc / hr -> 20cc->off  Increase topomax to 50mg  bid -> d/c  PICC 10/18->10/22 removed  Today, 05/12/2020, Ms. Thielman is being seen for hospital follow-up accompanied by her brother and interpreter. She reports she has been improving but continues to have right hand numbness, right peripheral visual impairment and gait impairment due to visual difficulty. She has not done any therapies since discharge due to lack of insurance.  She is in the process of applying for [employer] financial assistance as well as other insurance..  Denies new or worsening stroke/TIA symptoms. 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  She lives with her husband and 2 sons able to maintain ADLs independently and has been slowly returning to completing IADLs.  She continues on Keppra 500 mg twice daily tolerating well and denies any reoccurring seizure activity.  She remains on warfarin without bleeding or bruising with INR levels monitored by PCP weekly. Per patient, INR levels have been greater than 3 so currently doing dosage adjustments per PCP. Remains on atorvastatin without myalgias. Blood pressure today 114/68 - not routinely monitored at home.  She has since discontinued OCP.  No further concerns at this  time.    ROS:   14 system review of systems performed and negative with exception of those listed in HPI  PMH:  Past Medical History:  Diagnosis Date  . Intracerebral hemorrhage (HCC)     PSH: No past surgical history on file.  Social History:  Social History   Socioeconomic History  . Marital status: Married    Spouse name: Not on file  . Number of children: Not on file  . Years of education: Not on file  . Highest education level: Not on file   Occupational History  . Not on file  Tobacco Use  . Smoking status: Never Smoker  . Smokeless tobacco: Never Used  Substance and Sexual Activity  . Alcohol use: Not Currently  . Drug use: Never  . Sexual activity: Not on file  Other Topics Concern  . Not on file  Social History Narrative  . Not on file   Social Determinants of Health   Financial Resource Strain:   . Difficulty of Paying Living Expenses: Not on file  Food Insecurity:   . Worried About Charity fundraiser in the Last Year: Not on file  . Ran Out of Food in the Last Year: Not on file  Transportation Needs:   . Lack of Transportation (Medical): Not on file  . Lack of Transportation (Non-Medical): Not on file  Physical Activity:   . Days of Exercise per Week: Not on file  . Minutes of Exercise per Session: Not on file  Stress:   . Feeling of Stress : Not on file  Social Connections:   . Frequency of Communication with Friends and Family: Not on file  . Frequency of Social Gatherings with Friends and Family: Not on file  . Attends Religious Services: Not on file  . Active Member of Clubs or Organizations: Not on file  . Attends Archivist Meetings: Not on file  . Marital Status: Not on file  Intimate Partner Violence:   . Fear of Current or Ex-Partner: Not on file  . Emotionally Abused: Not on file  . Physically Abused: Not on file  . Sexually Abused: Not on file    Family History: No family history on file.  Medications:   Current Outpatient Medications on File Prior to Visit  Medication Sig Dispense Refill  . atorvastatin (LIPITOR) 40 MG tablet Take 1 tablet (40 mg total) by mouth daily. 30 tablet 0  . levETIRAcetam (KEPPRA) 500 MG tablet Take 1 tablet (500 mg total) by mouth 2 (two) times daily. 60 tablet 0  . pantoprazole (PROTONIX) 40 MG tablet Take 1 tablet (40 mg total) by mouth daily. 30 tablet 0  . warfarin (COUMADIN) 5 MG tablet Take 1 tablet (5 mg total) by mouth one time only at 4  PM. Take as instructed 30 tablet 1   No current facility-administered medications on file prior to visit.    Allergies:  No Known Allergies    OBJECTIVE:  Physical Exam  Vitals:   05/12/20 1017  BP: 114/68  Pulse: 78  Weight: 135 lb (61.2 kg)   Body mass index is 26.37 kg/m. No exam data present   Post stroke PHQ 2/9 Depression screen PHQ 2/9 05/12/2020  Decreased Interest 0  Down, Depressed, Hopeless 0  PHQ - 2 Score 0     General: well developed, well nourished,  very pleasant middle-aged Hispanic female, seated, in no evident distress Head: head normocephalic and atraumatic.   Neck: supple with no  carotid or supraclavicular bruits Cardiovascular: regular rate and rhythm, no murmurs Musculoskeletal: no deformity Skin:  no rash/petichiae Vascular:  Normal pulses all extremities   Neurologic Exam Mental Status: Awake and fully alert.   Limited English primarily Spanish-speaking.  Denies dysarthria or aphasia.  Oriented to place and time. Recent memory subjectively impaired and remote memory intact. Attention span, concentration and fund of knowledge appropriate. Mood and affect appropriate.  Cranial Nerves: Fundoscopic exam reveals sharp disc margins. Pupils equal, briskly reactive to light. Extraocular movements full without nystagmus. Visual fields right homonymous inferior quadrantanopia. Hearing intact. Facial sensation intact. Face, tongue, palate moves normally and symmetrically.  Motor: Normal bulk and tone. Normal strength in all tested extremity muscles. Sensory.: intact to touch , pinprick , position and vibratory sensation.  Coordination: Rapid alternating movements normal in all extremities. Finger-to-nose and heel-to-shin performed accurately bilaterally. Gait and Station: Arises from chair without difficulty. Stance is normal. Gait demonstrates normal stride length and mild imbalance upon initiation of ambulation.  Able to perform tandem walk and heel toe  without difficulty.  Romberg negative. Reflexes: 1+ and symmetric. Toes downgoing.     NIHSS  1 Modified Rankin  2-3      ASSESSMENT: Erica Rosario is a 44 y.o. 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  Questionable hypercoagulable state with evidence of nonocclusive thrombosis of bilateral renal vein and anticardiolipin IgM 19 (indeterminate) and OCP use which was advised to discontinue.  Also started on Keppra for G TC and EEG showing bilateral parieto-occipital spikes.  She returned on 04/04/2020 (d/c'd home from [place] on 10/15 in stable condition) with worsening headache, N/V and generalized weakness found to have worsening vasogenic edema in left parieto-occipital region and worsening midline shift of 13 mm requiring reversal of warfarin, mannitol x1, Decadron and hypertonic saline.  Hospital course complicated by severe anemia, leukocytosis and fever, and elevated liver enzymes (? R/t Topamax).  Vascular risk factors include HLD.     PLAN:  1. Sagittal sinus thrombosis w/ hemorrhagic transformation:  a. Residual deficit: Right inferior quadrantanopia, subjective right hand numbness and gait impairment.  Has been unable to participate in therapies due to lack of insurance.  Discussed exercises to do at home and possibly initiate therapy once insurance coverage established if indicated at that time. b. Plan on repeating CTV in 2 to 3 months with plans on continuing anticoagulation for 3 to 3-month duration.  Advised continued follow-up with PCP for monitoring and managing of warfarin and INR levels c. Continue warfarin daily  and atorvastatin for secondary stroke prevention.  d. Discussed secondary stroke prevention measures and importance of close PCP follow up for aggressive stroke risk factor management   2. ?  Hypercoagulable state: Plan on repeating anticardiolipin and protein S once off of warfarin.  She has since discontinued OCP and recommended follow-up with gynecologist for further discussion of contraceptive measures 3. Seizure, with stroke onset: No reoccurring seizure activity.  Continue Keppra 500 mg twice daily for seizure prophylaxis 4. HLD: LDL goal <70. Recent direct LDL 151.  Continue atorvastatin and request follow-up with PCP in the next 1 to 2 months for repeat lipid panel and ongoing prescribing of atorvastatin     Follow up in 4 months or call earlier if needed   CC:  [place] provider: Dr. Leonie Man Center, [place]  I spent 50 minutes of face-to-face and non-face-to-face time with patient, brother and interpreter.  This included previsit chart review including review of recent hospitalization pertinent progress notes, lab work and imaging, lab review, study review, order entry, electronic health record documentation, patient education regarding recent stroke and possible etiology, residual deficits, ongoing use of AC and future surveillance monitoring, importance of managing stroke risk factors and answered all questions to patient and brothers satisfaction   Frann Rider, AGNP-BC  [facility] [address] [place], [place] [address]  Phone [phone] Fax [phone] Note: This document was prepared with digital dictation and possible smart phrase technology. Any transcriptional errors that result from this process are unintentional.

## 2020-05-13 NOTE — Progress Notes (Signed)
I agree with the above plan 

## 2020-08-12 ENCOUNTER — Other Ambulatory Visit: Payer: Self-pay | Admitting: Family Medicine

## 2020-08-12 DIAGNOSIS — Z1231 Encounter for screening mammogram for malignant neoplasm of breast: Secondary | ICD-10-CM

## 2020-08-24 ENCOUNTER — Other Ambulatory Visit: Payer: Self-pay

## 2020-08-24 ENCOUNTER — Emergency Department (HOSPITAL_COMMUNITY)
Admission: EM | Admit: 2020-08-24 | Discharge: 2020-08-25 | Disposition: A | Discharge status: Home / Self Care | Payer: Medicaid Other | Attending: Emergency Medicine | Admitting: Emergency Medicine

## 2020-08-24 ENCOUNTER — Emergency Department (HOSPITAL_COMMUNITY): Payer: Medicaid Other

## 2020-08-24 DIAGNOSIS — Z7901 Long term (current) use of anticoagulants: Secondary | ICD-10-CM | POA: Insufficient documentation

## 2020-08-24 DIAGNOSIS — H53451 Other localized visual field defect, right eye: Secondary | ICD-10-CM | POA: Diagnosis not present

## 2020-08-24 DIAGNOSIS — Z8669 Personal history of other diseases of the nervous system and sense organs: Secondary | ICD-10-CM | POA: Insufficient documentation

## 2020-08-24 DIAGNOSIS — D649 Anemia, unspecified: Secondary | ICD-10-CM | POA: Insufficient documentation

## 2020-08-24 DIAGNOSIS — R519 Headache, unspecified: Secondary | ICD-10-CM | POA: Insufficient documentation

## 2020-08-24 DIAGNOSIS — N939 Abnormal uterine and vaginal bleeding, unspecified: Secondary | ICD-10-CM

## 2020-08-24 LAB — CBC
HCT: 27.1 % — ABNORMAL LOW (ref 36.0–46.0)
Hemoglobin: 8.7 g/dL — ABNORMAL LOW (ref 12.0–15.0)
MCH: 26.7 pg (ref 26.0–34.0)
MCHC: 32.1 g/dL (ref 30.0–36.0)
MCV: 83.1 fL (ref 80.0–100.0)
Platelets: 366 10*3/uL (ref 150–400)
RBC: 3.26 MIL/uL — ABNORMAL LOW (ref 3.87–5.11)
RDW: 14.6 % (ref 11.5–15.5)
WBC: 5.8 10*3/uL (ref 4.0–10.5)
nRBC: 0 % (ref 0.0–0.2)

## 2020-08-24 LAB — HCG, QUANTITATIVE, PREGNANCY: hCG, Beta Chain, Quant, S: 1 m[IU]/mL (ref <5)

## 2020-08-24 LAB — DIFFERENTIAL
Abs Immature Granulocytes: 0.02 10*3/uL (ref 0.00–0.07)
Basophils Absolute: 0 10*3/uL (ref 0.0–0.1)
Basophils Relative: 1 %
Eosinophils Absolute: 0.1 10*3/uL (ref 0.0–0.5)
Eosinophils Relative: 1 %
Immature Granulocytes: 0 %
Lymphocytes Relative: 51 %
Lymphs Abs: 2.9 10*3/uL (ref 0.7–4.0)
Monocytes Absolute: 0.6 10*3/uL (ref 0.1–1.0)
Monocytes Relative: 10 %
Neutro Abs: 2.1 10*3/uL (ref 1.7–7.7)
Neutrophils Relative %: 37 %

## 2020-08-24 LAB — COMPREHENSIVE METABOLIC PANEL
ALT: 31 U/L (ref 0–44)
AST: 25 U/L (ref 15–41)
Albumin: 3.5 g/dL (ref 3.5–5.0)
Alkaline Phosphatase: 84 U/L (ref 38–126)
Anion gap: 9 (ref 5–15)
BUN: 8 mg/dL (ref 6–20)
CO2: 22 mmol/L (ref 22–32)
Calcium: 8.7 mg/dL — ABNORMAL LOW (ref 8.9–10.3)
Chloride: 108 mmol/L (ref 98–111)
Creatinine, Ser: 0.66 mg/dL (ref 0.44–1.00)
GFR, Estimated: >60 mL/min (ref >60)
Glucose, Bld: 84 mg/dL (ref 70–99)
Potassium: 3.3 mmol/L — ABNORMAL LOW (ref 3.5–5.1)
Sodium: 139 mmol/L (ref 135–145)
Total Bilirubin: 0.2 mg/dL — ABNORMAL LOW (ref 0.3–1.2)
Total Protein: 6.5 g/dL (ref 6.5–8.1)

## 2020-08-24 LAB — I-STAT CHEM 8, ED
BUN: 8 mg/dL (ref 6–20)
Calcium, Ion: 1.02 mmol/L — ABNORMAL LOW (ref 1.15–1.40)
Chloride: 110 mmol/L (ref 98–111)
Creatinine, Ser: 0.6 mg/dL (ref 0.44–1.00)
Glucose, Bld: 78 mg/dL (ref 70–99)
HCT: 26 % — ABNORMAL LOW (ref 36.0–46.0)
Hemoglobin: 8.8 g/dL — ABNORMAL LOW (ref 12.0–15.0)
Potassium: 3.5 mmol/L (ref 3.5–5.1)
Sodium: 143 mmol/L (ref 135–145)
TCO2: 23 mmol/L (ref 22–32)

## 2020-08-24 LAB — IRON AND TIBC
Iron: 10 ug/dL — ABNORMAL LOW (ref 28–170)
Saturation Ratios: 2 % — ABNORMAL LOW (ref 10.4–31.8)
TIBC: 486 ug/dL — ABNORMAL HIGH (ref 250–450)
UIBC: 476 ug/dL

## 2020-08-24 LAB — PROTIME-INR
INR: 2.2 — ABNORMAL HIGH (ref 0.8–1.2)
Prothrombin Time: 23.4 seconds — ABNORMAL HIGH (ref 11.4–15.2)

## 2020-08-24 LAB — VITAMIN B12: Vitamin B-12: 599 pg/mL (ref 180–914)

## 2020-08-24 LAB — TYPE AND SCREEN
ABO/RH(D): B POS
Antibody Screen: NEGATIVE

## 2020-08-24 LAB — APTT: aPTT: 33 seconds (ref 24–36)

## 2020-08-24 LAB — FOLATE: Folate: 11.9 ng/mL (ref >5.9)

## 2020-08-24 LAB — RETICULOCYTES
Immature Retic Fract: 29.7 % — ABNORMAL HIGH (ref 2.3–15.9)
RBC.: 3.27 MIL/uL — ABNORMAL LOW (ref 3.87–5.11)
Retic Count, Absolute: 59.2 10*3/uL (ref 19.0–186.0)
Retic Ct Pct: 1.8 % (ref 0.4–3.1)

## 2020-08-24 LAB — FERRITIN: Ferritin: 3 ng/mL — ABNORMAL LOW (ref 11–307)

## 2020-08-24 MED ORDER — SODIUM CHLORIDE 0.9% FLUSH
3.0000 mL | Freq: Once | INTRAVENOUS | Status: AC
  Administered 2020-08-24: 3 mL via INTRAVENOUS

## 2020-08-24 MED ORDER — GADOBUTROL 1 MMOL/ML IV SOLN
6.5000 mL | Freq: Once | INTRAVENOUS | Status: AC | PRN
  Administered 2020-08-24: 6.5 mL via INTRAVENOUS

## 2020-08-24 NOTE — ED Provider Notes (Signed)
[facility] EMERGENCY DEPARTMENT Provider Note   CSN: [id number] Arrival date & time: 08/24/20  1818     History Chief Complaint  Patient presents with  . Headache  . Dizziness  . Eye Problem    Erica Rosario is a 45 y.o. female.  HPI 45 year old female presents with headache and eye complaints.  History is obtained with the help of the Spanish interpreter line.  Patient started having this headache yesterday.  Rates as a 4/10.  Similar eye symptoms to when she had a stroke last year.  Some visual field cut.  No fevers.  When asked about potential blood loss after seeing her hemoglobin, the patient endorses heavy periods for the last few months.  She has also had irregular periods and her cycle that is on now is a little early.  No rectal bleeding or other obvious bleeding.  No abdominal pain.   Past Medical History:  Diagnosis Date  . Intracerebral hemorrhage [facility])     Patient Active Problem List   Diagnosis Date Noted  . Cerebral edema ([place]) 04/10/2020  . Leukocytosis 04/10/2020  . Syncope 04/10/2020  . Bradycardia 04/10/2020  . Thrombocytosis 04/10/2020  . 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 Vascular headache   . Nausea without vomiting   . Thrombosis of superior sagittal sinus   . Transaminitis   . Diarrhea   . 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 Seizure ([place]) 03/31/2020  . Acute cerebral venous infarction associated with CSVT ([place]) 03/31/2020  . Hypercoagulable state ([place]) 03/31/2020  . Hyperlipidemia 03/31/2020  . Overweight 03/31/2020  . Anemia 03/31/2020  . 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 Left-sided nontraumatic intracerebral hemorrhage ([place]) 03/25/2020    No past surgical history on file.   OB History   No obstetric history on file.     No family history on file.  Social History   Tobacco Use  . Smoking status: Never Smoker  . Smokeless tobacco: Never Used  Substance Use Topics  . Alcohol use: Not  Currently  . Drug use: Never    Home Medications Prior to Admission medications   Medication Sig Start Date End Date Taking? Authorizing Provider  atorvastatin (LIPITOR) 40 MG tablet Take 1 tablet (40 mg total) by mouth daily. 04/02/20   Angiulli, Lavon Paganini, PA-C  levETIRAcetam (KEPPRA) 500 MG tablet Take 1 tablet (500 mg total) by mouth 2 (two) times daily. 04/02/20   Angiulli, Lavon Paganini, PA-C  pantoprazole (PROTONIX) 40 MG tablet Take 1 tablet (40 mg total) by mouth daily. 04/03/20   Jamse Arn, MD  warfarin (COUMADIN) 5 MG tablet Take 1 tablet (5 mg total) by mouth one time only at 4 PM. Take as instructed 04/11/20   Rinehuls, Early Chars, PA-C    Allergies    Patient has no known allergies.  Review of Systems   Review of Systems  Constitutional: Negative for fever.  Eyes: Positive for visual disturbance.  Gastrointestinal: Negative for vomiting.  Neurological: Positive for headaches. Negative for weakness and numbness.  All other systems reviewed and are negative.   Physical Exam Updated Vital Signs BP 113/71   Pulse (!) 58   Temp 98.4 F (36.9 C) (Oral)   Resp 12   SpO2 100%   Physical Exam Vitals and nursing note reviewed.  Constitutional:      Appearance: She is well-developed and well-nourished.  HENT:     Head: Normocephalic and atraumatic.     Right Ear: External  ear normal.     Left Ear: External ear normal.     Nose: Nose normal.  Eyes:     General:        Right eye: No discharge.        Left eye: No discharge.     Extraocular Movements: Extraocular movements intact.     Pupils: Pupils are equal, round, and reactive to light.  Cardiovascular:     Rate and Rhythm: Normal rate and regular rhythm.     Heart sounds: Normal heart sounds.  Pulmonary:     Effort: Pulmonary effort is normal.     Breath sounds: Normal breath sounds.  Abdominal:     Palpations: Abdomen is soft.     Tenderness: There is no abdominal tenderness.  Musculoskeletal:      Cervical back: Neck supple.  Skin:    General: Skin is warm and dry.  Neurological:     Mental Status: She is alert.     Comments: CN 3-12 grossly intact. 5/5 strength in all 4 extremities. Grossly normal sensation. Normal finger to nose.   Psychiatric:        Mood and Affect: Mood is not anxious.     ED Results / Procedures / Treatments   Labs (all labs ordered are listed, but only abnormal results are displayed) Labs Reviewed  PROTIME-INR - Abnormal; Notable for the following components:      Result Value   Prothrombin Time 23.4 (*)    INR 2.2 (*)    All other components within normal limits  CBC - Abnormal; Notable for the following components:   RBC 3.26 (*)    Hemoglobin 8.7 (*)    HCT 27.1 (*)    All other components within normal limits  COMPREHENSIVE METABOLIC PANEL - Abnormal; Notable for the following components:   Potassium 3.3 (*)    Calcium 8.7 (*)    Total Bilirubin 0.2 (*)    All other components within normal limits  RETICULOCYTES - Abnormal; Notable for the following components:   RBC. 3.27 (*)    Immature Retic Fract 29.7 (*)    All other components within normal limits  I-STAT CHEM 8, ED - Abnormal; Notable for the following components:   Calcium, Ion 1.02 (*)    Hemoglobin 8.8 (*)    HCT 26.0 (*)    All other components within normal limits  APTT  DIFFERENTIAL  HCG, QUANTITATIVE, PREGNANCY  VITAMIN B12  IRON AND TIBC  FERRITIN  FOLATE  CBG MONITORING, ED  I-STAT BETA HCG BLOOD, ED ([facility], WL, AP ONLY)  POC OCCULT BLOOD, ED  TYPE AND SCREEN    EKG EKG Interpretation  Date/Time:  Monday August 24 2020 19:12:35 EST Ventricular Rate:  55 PR Interval:  128 QRS Duration: 94 QT Interval:  456 QTC Calculation: 436 R Axis:   20 Text Interpretation: Sinus bradycardia Low voltage QRS Borderline ECG Confirmed by Sherwood Gambler [phone]) on 08/24/2020 9:46:13 PM   Radiology CT HEAD WO CONTRAST  Result Date: 08/24/2020 CLINICAL DATA:  Dizziness,  posterior headache, neurologic deficit EXAM: CT HEAD WITHOUT CONTRAST TECHNIQUE: Contiguous axial images were obtained from the base of the skull through the vertex without intravenous contrast. COMPARISON:  04/09/2020 FINDINGS: Brain: Encephalomalacia within the left occipital region consistent with prior infarct. No signs of acute infarct or hemorrhage. Lateral ventricles and midline structures are unremarkable. No acute extra-axial fluid collections. No mass effect. Vascular: No hyperdense vessel or unexpected calcification. Skull: Normal. Negative for fracture  or focal lesion. Sinuses/Orbits: No acute finding. Other: None. IMPRESSION: 1. Encephalomalacia left occipital lobe related to prior infarct. 2. No acute intracranial process. Electronically Signed   By: Randa Ngo M.D.   On: 08/24/2020 19:33    Procedures Procedures   Medications Ordered in ED Medications  sodium chloride flush (NS) 0.9 % injection 3 mL (3 mLs Intravenous Given 08/24/20 2214)    ED Course  I have reviewed the triage vital signs and the nursing notes.  Pertinent labs & imaging results that were available during my care of the patient were reviewed by me and considered in my medical decision making (see chart for details).    MDM Rules/Calculators/A&P                          Patient with recurrent headache similar to her prior bleed/CVT.  Exam is benign.  Vital signs are stable.  Discussed with Dr. Cheral Marker, given initial negative head CT will get MR I of the brain as well as MRV with and without contrast.  She is noted to be anemic which is likely related to her heavy menstrual cycles.  However this should not be causing her strokelike symptoms/headache. Currently MRs pending, care transferred to Dr. Dayna Barker.  Final Clinical Impression(s) / ED Diagnoses Final diagnoses:  None    Rx / DC Orders ED Discharge Orders    None       Sherwood Gambler, MD 08/24/20 2311

## 2020-08-24 NOTE — ED Notes (Signed)
Pt in MRI.

## 2020-08-24 NOTE — ED Triage Notes (Signed)
Pt with hx of ocular stroke presents with same symptoms as she had in October-dizziness since last night, flashing lights in R eye, and posterior headache.

## 2020-08-25 LAB — CBC
HCT: 26.9 % — ABNORMAL LOW (ref 36.0–46.0)
Hemoglobin: 8.2 g/dL — ABNORMAL LOW (ref 12.0–15.0)
MCH: 25.7 pg — ABNORMAL LOW (ref 26.0–34.0)
MCHC: 30.5 g/dL (ref 30.0–36.0)
MCV: 84.3 fL (ref 80.0–100.0)
Platelets: 333 10*3/uL (ref 150–400)
RBC: 3.19 MIL/uL — ABNORMAL LOW (ref 3.87–5.11)
RDW: 14.6 % (ref 11.5–15.5)
WBC: 5.4 10*3/uL (ref 4.0–10.5)
nRBC: 0 % (ref 0.0–0.2)

## 2020-08-25 MED ORDER — TRANEXAMIC ACID 650 MG PO TABS
1300.0000 mg | ORAL_TABLET | Freq: Once | ORAL | Status: AC
  Administered 2020-08-25: 1300 mg via ORAL
  Filled 2020-08-25: qty 2

## 2020-08-25 MED ORDER — TRANEXAMIC ACID 650 MG PO TABS: 1300.0000 mg | ORAL_TABLET | Freq: Two times a day (BID) | ORAL | 0 refills | Status: AC

## 2020-09-09 ENCOUNTER — Ambulatory Visit: Payer: Medicaid Other | Admitting: Adult Health

## 2020-09-09 ENCOUNTER — Other Ambulatory Visit: Payer: Self-pay

## 2020-09-09 ENCOUNTER — Encounter: Payer: Self-pay | Admitting: Adult Health

## 2020-09-09 VITALS — BP 110/67 | HR 64 | Ht 59.06 in | Wt 144.0 lb

## 2020-09-09 DIAGNOSIS — Z8673 Personal history of transient ischemic attack (TIA), and cerebral infarction without residual deficits: Secondary | ICD-10-CM | POA: Diagnosis not present

## 2020-09-09 DIAGNOSIS — G08 Intracranial and intraspinal phlebitis and thrombophlebitis: Secondary | ICD-10-CM

## 2020-09-09 DIAGNOSIS — D6859 Other primary thrombophilia: Secondary | ICD-10-CM | POA: Diagnosis not present

## 2020-09-09 DIAGNOSIS — R569 Unspecified convulsions: Secondary | ICD-10-CM

## 2020-09-09 DIAGNOSIS — I639 Cerebral infarction, unspecified: Secondary | ICD-10-CM | POA: Diagnosis not present

## 2020-09-09 DIAGNOSIS — I6930 Unspecified sequelae of cerebral infarction: Secondary | ICD-10-CM

## 2020-09-09 NOTE — Patient Instructions (Signed)
Continue warfarin daily  and atorvastatin 10mg  daily  for secondary stroke prevention  Continue to follow with your PCP regarding anemia and iron deficiency   Continue keppra 500mg  twice daily for seizure prevention  Continue to follow up with PCP regarding cholesterol management  Maintain strict control of cholesterol with LDL cholesterol (bad cholesterol) goal below 70 mg/dL.       Followup in the future with me in 4 months or call earlier if needed       Thank you for coming to see [place] at [facility]. I hope we have been able to provide you high quality care today.  You may receive a patient satisfaction survey over the next few weeks. We would appreciate your feedback and comments so that we may continue to improve ourselves and the health of our patients.

## 2020-09-09 NOTE — Progress Notes (Signed)
I agree with the above plan 

## 2020-09-09 NOTE — Progress Notes (Signed)
[facility] [address] [place]. [place] [address] [phone]       STROKE FOLLOW UP NOTE  [name] Date of Birth:  [birth year] Medical Record Number:  [id number]   Reason for Referral: stroke follow up    SUBJECTIVE:   CHIEF COMPLAINT:  Chief Complaint  Patient presents with  . Follow-up    RM 14 with interpreter  PT is having some dizziness and headaches and had to go to hospital few weeks ago. Fingers hurt and have involuntary movement but no tingling/numbness     HPI:   Today, 09/09/2020, Erica Rosario returns for stroke follow-up after prior visit approximately 4 months ago accompanied by interpreter.   Residual deficits of mild right peripheral impairment but is improving.  She denies residual right hand numbness Evaluated in the ED 08/24/2020 with headache, dizziness and visual complaints. MRI and MRV unremarkable for acute findings.  Noted to be anemic with low iron levels which was felt possibly related to heavy menstrual cycles. Headache and dizziness have since resolved. She has since f/u with OB/GYN with plans on placing IUD to help control menstrual cycles.  Also follow-up with PCP who initiated ferrous sulfate. Has f/u next week for repeat lab work Denies new stroke/TIA symptoms  reports bilateral hand finger joint pain with increased stiffness and quick lasting "tremor" with extension of the fingers upon awakening which has been present over the past month.  Denies any weakness, pain in other locations such as wrist or arm or numbness/tingling  Continues on Keppra 500 mg twice daily -tolerating denies any recent seizure activity  Remains on warfarin with INR levels monitored by PCP- per patient, INR level stable - unable to view via epic - aside from heavy menstrual cycles, no other bleeding or bruising concerns Remains on atorvastatin -denies associated side effects  Blood pressure today 110/67 - does not routinely monitor at  home  No further concerns at this time      History provided for reference purposes only Initial visit 05/12/2020 JM: Erica Rosario is being seen for hospital follow-up accompanied by her brother and interpreter. She reports she has been improving but continues to have right hand numbness, right peripheral visual impairment and gait impairment due to visual difficulty. She has not done any therapies since discharge due to lack of insurance.  She is in the process of applying for [facility] financial assistance as well as other insurance..  Denies new or worsening stroke/TIA symptoms. 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  She lives with her husband and 2 sons able to maintain ADLs independently and has been slowly returning to completing IADLs.  She continues on Keppra 500 mg twice daily tolerating well and denies any reoccurring seizure activity.  She remains on warfarin without bleeding or bruising with INR levels monitored by PCP weekly. Per patient, INR levels have been greater than 3 so currently doing dosage adjustments per PCP. Remains on atorvastatin without myalgias. Blood pressure today 114/68 - not routinely monitored at home.  She has since discontinued OCP.  No further concerns at this time.  Stroke admission 03/25/2020 JM: Erica Rosario is a 45 y.o. female with no PMH  who presented to [facility] with HA and neck stiffness on 03/25/2020.   Personally reviewed hospitalization pertinent progress notes, lab work and imaging with summary provided.  Shortly transferred to [facility] and evaluated by Dr. 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  Repeat CT head showed hemorrhagic transformation L parietal with slight increase edema with most hemorrhagic foci unchanged and single new hemorrhage at  vertex.  Repeat CT head 3 hours later stable. Pan CT negative for malignancy but did show B renal vein nonocclusive thrombosis. CTV and CTA h/n negative LVO with partial thrombosis of the superior sagittal sinus at the vertex, nonocclusive as well can be demonstrated around the margins of the thrombus with patency beyond that.  Also showed thrombosed superficial draining vein of the left again visible, similar in appearance with low density edema slightly increased and mild swelling with mass-effect and 1 to 2 mm of left-to-right shift and flattening of the left lateral ventricle.  Questionable hypercoagulable state with hypercoagulable showed anticardiolipin IgM 19 (indeterminate) and protein S total and active mildly low but likely inaccurate on heparin.  Advise to discontinue OCP use.  Placed on heparin IV and warfarin daily. GTC in ER s/p Ativan and Keppra load and initiated Keppra 500 mg twice daily with LTM showing left frontal temporal continuous slowing and bilateral parieto-occipital spikes.  LDL UTC with direct LDL of 151 no statin PTA and initiated atorvastatin 40 mg daily.  No prior history of HTN or DM.  Other stroke risk factors include history of EtOH use and overweight but no prior stroke history.  Other active problems include anemia, leukocytosis and hypokalemia.  Evaluated by therapies and recommended discharge to CIR for ongoing therapy needs.  She was discharged to CIR on 03/31/2020 and discharged home on 04/03/2020 on Keppra, Topamax and warfarin.    She unfortunately returned on 04/04/2020 with complaint of headache, generalized weakness and "faintness" as well as nauseated and several episodes of emesis but no focal deficits noted.  She was found to have worsening vasogenic edema in left parieto-occipital region and worsening midline shift of 13 mm.  Warfarin discontinued and reversed with vitamin K.  Administering mannitolx1, Decadron, hypertonic saline and heparin drip.  Repeat CT head  showed improved MLS, cerebral edema and decreased hydrocephalus and again repeat CT head showed stable edema with decreasing IPH and same 1 cm midline shift. NSG on board PRN.  Also found to have severe anemia receiving 2U PRBC with improvement potentially contributing to syncopal event on 10/15 at home.  Elevated liver enzymes of unclear etiology possibly related to Topamax therefore DC'd.  Restarted warfarin with INR goal 2-3 in addition to Lovenox for total of 4 doses.  Advised to follow-up with [facility] on 10/25 for pro time blood test and CBC as well as INR levels and adjustment of warfarin as indicated and possible discontinuation of Lovenox.  Discharged on dexamethasone.  Reevaluated by therapy recommended outpatient OT and no PT needs.  She was discharged home on 04/11/2020.   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 hemorrhagic infarct w/ surrounded edema  MRI Superior sagittal sinus thrombosis w/ hemorrhagic transformation L occipital venous infarct. Also thrombosis draining cortical vein. 2 areas enhancement in L occipital hemorrhage possible ongoing hemorrhage. Similar edema w/ slight R shift.  CTA and CTV 10/9 - Partial thrombosis of the superior sagittal sinus at the vertex, Thrombosed superficial draining vein on the left, Hemorrhagic infarction in the left parietal cortical and  subcortical brain   Pan CTneg formalignancy, B renal vein nonocclusive thrombosis.  CTV 10/16 Decreased volume of nonocclusive superior sagittal sinus Thrombus. Decreased conspicuity of thrombus within an adjacent left cortical vein.  Repeat CT head improved MLS, cerebral edema and decreased hydrocephalus   2D EchoEF60-65%. No source of embolus  LE dopplers 10/19 - no DVT   LDLUTC, TC 210, TG 132, direct  LDL151.9  HgbA1c5.6  UDS+benzos  Hypercoagulable labs unremarkable(anticardiolipin IgM slightly elevated, prot S total and activitymildlylow -not accurateon heparin)  VTE prophylaxis -heparin IV   No antithromboticprior to admission, now on full dose lovenox. On coumadin with INR goal 2-3.  Today INR 1.5  Decadron 4mg  IV q6h ->2mg  IV q 6h -> 2mg  po Q6h -> 2mg  Q8   Therapy recommendations: Outpt OT, no PT - gave pt info for pro bono therapies at [facility]  Disposition: home  Cerebral Edema  CT head Left parieto-occipital venous infarct with increased edema and mass effect including 13 mm of rightward midline shift. New mild dilatation of the right lateral ventricle suggesting early trapping  Repeat CT improved MLS, cerebral edema and decreased hydrocephalus  CT repeat stable edema, decreasing IPH. Same 1cm midline shift   NSG on board PRN  S/p mannitol x 1  Decadron 10mg  x 1 -> 4mg  Q6 -> 2mg  Q6 ->2mg  Q8 -> tapering  On 3% at 75 -> 40 cc / hr -> 20cc->off  Increase topomax to 50mg  bid -> d/c  PICC 10/18->10/22 removed      ROS:   14 system review of systems performed and negative with exception of those listed in HPI  PMH:  Past Medical History:  Diagnosis Date  . Intracerebral hemorrhage (HCC)     PSH: History reviewed. No pertinent surgical history.  Social History:  Social History   Socioeconomic History  . Marital status: Married    Spouse name: Not on file  . Number of children: Not on file  . Years of education: Not on file  . Highest education level: Not on file  Occupational History  . Not on file  Tobacco Use  . Smoking status: Never Smoker  . Smokeless tobacco: Never Used  Substance and Sexual Activity  . Alcohol use: Not Currently  . Drug use: Never  . Sexual activity: Not on file  Other Topics Concern  . Not on file  Social History Narrative  . Not on file   Social Determinants of Health   Financial Resource  Strain: Not on file  Food Insecurity: Not on file  Transportation Needs: Not on file  Physical Activity: Not on file  Stress: Not on file  Social Connections: Not on file  Intimate Partner Violence: Not on file    Family History: History reviewed. No pertinent family history.  Medications:   Current Outpatient Medications on File Prior to Visit  Medication Sig Dispense Refill  . atorvastatin (LIPITOR) 40 MG tablet Take 1 tablet (40 mg total) by mouth daily. 30 tablet 0  . Ferrous Sulfate (IRON) 325 (65 Fe) MG TABS Take 1 tablet by mouth three times a day  for anemia    . levETIRAcetam (KEPPRA) 500 MG tablet Take 1 tablet (500 mg total) by mouth 2 (two) times daily. 60 tablet 0  . pantoprazole (PROTONIX) 40 MG tablet Take 1 tablet (40 mg total) by mouth daily. 30 tablet 0  . warfarin (COUMADIN) 5 MG tablet Take 1 tablet (5 mg total) by mouth one time only at 4  PM. Take as instructed 30 tablet 1   No current facility-administered medications on file prior to visit.    Allergies:  No Known Allergies    OBJECTIVE:  Physical Exam  Vitals:   09/09/20 0931  BP: 110/67  Pulse: 64  Weight: 144 lb (65.3 kg)  Height: 4' 11.06" (1.5 m)   Body mass index is 29.03 kg/m. No exam data present  General: well developed, well nourished, very pleasant middle-aged Hispanic female, seated, in no evident distress Head: head normocephalic and atraumatic.   Neck: supple with no carotid or supraclavicular bruits Cardiovascular: regular rate and rhythm, no murmurs Musculoskeletal: no deformity; full range of fingers bilaterally - unable to appreciate any shaking or tremors; subjective pain bilateral hand PIP joints Skin:  no rash/petichiae Vascular:  Normal pulses all extremities   Neurologic Exam Mental Status: Awake and fully alert. Limited English primarily Spanish-speaking. Denies dysarthria or aphasia.  Oriented to place and time. Recent and remote memory intact. Attention span,  concentration and fund of knowledge appropriate. Mood and affect appropriate.  Cranial Nerves: Pupils equal, briskly reactive to light. Extraocular movements full without nystagmus. Visual fields right homonymous inferior quadrantanopia. Hearing intact. Facial sensation intact. Face, tongue, palate moves normally and symmetrically.  Motor: Normal bulk and tone. Normal strength in all tested extremity muscles. Sensory.: intact to touch , pinprick , position and vibratory sensation.  Coordination: Rapid alternating movements normal in all extremities. Finger-to-nose and heel-to-shin performed accurately bilaterally.  No evidence of action or rest tremors or abnormal hand movements Gait and Station: Arises from chair without difficulty. Stance is normal. Gait demonstrates normal stride length and balance without use of assistive device.  Able to perform tandem walk and heel toe without difficulty.  Romberg negative. Reflexes: 1+ and symmetric. Toes downgoing.       ASSESSMENT: Erica Rosario is a 45 y.o. 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  Questionable hypercoagulable state with evidence of nonocclusive thrombosis of bilateral renal vein and anticardiolipin IgM 19 (indeterminate) and OCP use. Also started on Keppra for GTC and EEG showing bilateral parieto-occipital spikes.  She returned on 04/04/2020 (d/c'd home from [facility] on 10/15 in stable condition) with worsening headache, N/V and generalized weakness found to have worsening vasogenic edema in left parieto-occipital region and worsening midline shift of 13 mm requiring reversal of warfarin, mannitol x1, Decadron and hypertonic saline with improvement.  Hospital course complicated by severe anemia, leukocytosis and fever, and elevated liver enzymes (? R/t Topamax).   Vascular risk factors include anemia, HLD and OCP use.     PLAN:  1. Sagittal sinus thrombosis w/ hemorrhagic transformation:  a. Residual deficit: Right inferior homonymous quadrantanopia -improving per patient report b. MRV 08/24/2020 no evidence of residual thrombosis c. Continue warfarin daily  and atorvastatin for secondary stroke prevention.  d. Continuation of warfarin for at least 6-9 months in setting of dural thrombus and bilateral renal vein thrombosis per Dr. Clydene Fake recommendations -continue to follow with PCP for INR level monitoring with INR goal 2-3 e. Discussed secondary stroke prevention measures and importance of close PCP follow up for aggressive stroke risk factor management  f. HLD: LDL goal<70.  On atorvastatin 10 mg daily. Has f/u with PCP next week for repeat lab work -request lipid panel be obtained at that time as well if able 2. ?  Hypercoagulable  state: Repeat cardiolipin antibodies as prior IgM indeterminate at 19. Will plans on full repeat hypercoagulable labs once off from warfarin 3. Seizure, with stroke onset: No reoccurring seizure activity.  Continue Keppra 500 mg twice daily for seizure prophylaxis 4. Iron deficiency anemia: Chronic history.  Possibly in setting of heavy menstrual cycles - plans on having IUD placed by OB/GYN.  Currently on ferrous sulfate supplement.  Has follow-up with PCP next week for repeat lab work     Follow up in 4 months or call earlier if needed   CC:  [place] provider: Dr. Leonie Man [place], [place]    I spent 30 minutes of face-to-face and non-face-to-face time with patient and interpreter.  This included previsit chart review, lab review, study review, order entry, electronic health record documentation, patient education regarding prior stroke and possible etiology, residual deficits, ongoing use of AC and typical duration as well as discussion with Dr. Leonie Man, importance of managing stroke risk factors and  answered all other questions to patients satisfaction  Frann Rider, [facility]  [facility] [address] [place], [place] [address]  Phone [phone] Fax [phone] Note: This document was prepared with digital dictation and possible smart phrase technology. Any transcriptional errors that result from this process are unintentional.

## 2020-09-10 ENCOUNTER — Telehealth: Payer: Self-pay | Admitting: *Deleted

## 2020-09-10 ENCOUNTER — Other Ambulatory Visit: Payer: Self-pay

## 2020-09-10 ENCOUNTER — Ambulatory Visit
Admission: RE | Admit: 2020-09-10 | Discharge: 2020-09-10 | Disposition: A | Discharge status: Home / Self Care | Payer: Medicaid Other | Source: Ambulatory Visit | Attending: Family Medicine | Admitting: Family Medicine

## 2020-09-10 DIAGNOSIS — Z1231 Encounter for screening mammogram for malignant neoplasm of breast: Secondary | ICD-10-CM | POA: Diagnosis present

## 2020-09-10 NOTE — Telephone Encounter (Signed)
Called and LMVM for pt (to speak to nephew) to interpret, but if not there to call back with interpreter.

## 2020-09-15 NOTE — Telephone Encounter (Signed)
Called and spoke to pt via Dellie Burns, [place] interpreter language line [phone]. That relayed to her that JM/NP wanted her to know that after speaking to Dr. Leonie Man.  He recommends continuing warfarin for at least 6 to 9 months which would be mid summer.  Next appointment with Janett Billow is January 18, 2021.  We can discuss completion of warfarin at that time.  He also wanted to repeat a lab, this being okay while she is on warfarin.  I gave hours and days of when she can come in for that.  Once she completes the warfarin then she may have some additional lab work drawn then.  She verbalized understanding will come in for her labs either this week or next.  And will call back if any questions.

## 2020-09-16 ENCOUNTER — Other Ambulatory Visit (HOSPITAL_COMMUNITY): Payer: Self-pay | Admitting: Family Medicine

## 2020-09-16 ENCOUNTER — Other Ambulatory Visit: Payer: Self-pay | Admitting: Family Medicine

## 2020-09-16 DIAGNOSIS — D649 Anemia, unspecified: Secondary | ICD-10-CM

## 2020-09-16 DIAGNOSIS — N938 Other specified abnormal uterine and vaginal bleeding: Secondary | ICD-10-CM

## 2020-09-17 ENCOUNTER — Other Ambulatory Visit (INDEPENDENT_AMBULATORY_CARE_PROVIDER_SITE_OTHER): Payer: Self-pay

## 2020-09-17 ENCOUNTER — Other Ambulatory Visit: Payer: Self-pay

## 2020-09-17 DIAGNOSIS — D6859 Other primary thrombophilia: Secondary | ICD-10-CM

## 2020-09-17 DIAGNOSIS — Z0289 Encounter for other administrative examinations: Secondary | ICD-10-CM

## 2020-09-17 DIAGNOSIS — G08 Intracranial and intraspinal phlebitis and thrombophlebitis: Secondary | ICD-10-CM

## 2020-09-19 LAB — CARDIOLIPIN ANTIBODIES, IGG, IGM, IGA
Anticardiolipin IgA: <9 APL U/mL (ref 0–11)
Anticardiolipin IgG: <9 GPL U/mL (ref 0–14)
Anticardiolipin IgM: <9 MPL U/mL (ref 0–12)

## 2020-09-21 ENCOUNTER — Telehealth: Payer: Self-pay

## 2020-09-21 NOTE — Telephone Encounter (Signed)
Contacted pt regarding lab results from [facility]. Pt did not answer and couldn't leave VM per DPR.  Please advised patient that recent labs are satisfactory, no concerns. Call the office with questions.

## 2020-12-07 ENCOUNTER — Ambulatory Visit: Payer: Medicaid Other

## 2021-01-15 ENCOUNTER — Encounter (HOSPITAL_COMMUNITY): Payer: Self-pay

## 2021-01-15 ENCOUNTER — Other Ambulatory Visit: Payer: Self-pay

## 2021-01-15 ENCOUNTER — Emergency Department (HOSPITAL_COMMUNITY): Payer: 59

## 2021-01-15 ENCOUNTER — Emergency Department (HOSPITAL_COMMUNITY)
Admission: EM | Admit: 2021-01-15 | Discharge: 2021-01-15 | Disposition: A | Discharge status: Home / Self Care | Payer: 59 | Attending: Emergency Medicine | Admitting: Emergency Medicine

## 2021-01-15 DIAGNOSIS — H539 Unspecified visual disturbance: Secondary | ICD-10-CM | POA: Insufficient documentation

## 2021-01-15 DIAGNOSIS — R42 Dizziness and giddiness: Secondary | ICD-10-CM | POA: Diagnosis not present

## 2021-01-15 DIAGNOSIS — R519 Headache, unspecified: Secondary | ICD-10-CM | POA: Diagnosis not present

## 2021-01-15 DIAGNOSIS — Z20822 Contact with and (suspected) exposure to covid-19: Secondary | ICD-10-CM | POA: Diagnosis not present

## 2021-01-15 LAB — PROTIME-INR
INR: 2.5 — ABNORMAL HIGH (ref 0.8–1.2)
Prothrombin Time: 27.4 seconds — ABNORMAL HIGH (ref 11.4–15.2)

## 2021-01-15 LAB — BASIC METABOLIC PANEL
Anion gap: 7 (ref 5–15)
BUN: 7 mg/dL (ref 6–20)
CO2: 24 mmol/L (ref 22–32)
Calcium: 9.2 mg/dL (ref 8.9–10.3)
Chloride: 109 mmol/L (ref 98–111)
Creatinine, Ser: 0.64 mg/dL (ref 0.44–1.00)
GFR, Estimated: >60 mL/min (ref >60)
Glucose, Bld: 87 mg/dL (ref 70–99)
Potassium: 3.6 mmol/L (ref 3.5–5.1)
Sodium: 140 mmol/L (ref 135–145)

## 2021-01-15 LAB — CBC WITH DIFFERENTIAL/PLATELET
Abs Immature Granulocytes: 0.01 10*3/uL (ref 0.00–0.07)
Basophils Absolute: 0 10*3/uL (ref 0.0–0.1)
Basophils Relative: 1 %
Eosinophils Absolute: 0 10*3/uL (ref 0.0–0.5)
Eosinophils Relative: 1 %
HCT: 36.8 % (ref 36.0–46.0)
Hemoglobin: 12 g/dL (ref 12.0–15.0)
Immature Granulocytes: 0 %
Lymphocytes Relative: 30 %
Lymphs Abs: 2.1 10*3/uL (ref 0.7–4.0)
MCH: 32.8 pg (ref 26.0–34.0)
MCHC: 32.6 g/dL (ref 30.0–36.0)
MCV: 100.5 fL — ABNORMAL HIGH (ref 80.0–100.0)
Monocytes Absolute: 0.7 10*3/uL (ref 0.1–1.0)
Monocytes Relative: 10 %
Neutro Abs: 4.1 10*3/uL (ref 1.7–7.7)
Neutrophils Relative %: 58 %
Platelets: 352 10*3/uL (ref 150–400)
RBC: 3.66 MIL/uL — ABNORMAL LOW (ref 3.87–5.11)
RDW: 13.4 % (ref 11.5–15.5)
WBC: 7 10*3/uL (ref 4.0–10.5)
nRBC: 0 % (ref 0.0–0.2)

## 2021-01-15 LAB — RESP PANEL BY RT-PCR (FLU A&B, COVID) ARPGX2
Influenza A by PCR: NEGATIVE
Influenza B by PCR: NEGATIVE
SARS Coronavirus 2 by RT PCR: NEGATIVE

## 2021-01-15 LAB — POC URINE PREG, ED: Preg Test, Ur: NEGATIVE

## 2021-01-15 MED ORDER — MECLIZINE HCL 25 MG PO TABS
25.0000 mg | ORAL_TABLET | Freq: Once | ORAL | Status: AC
  Administered 2021-01-15: 25 mg via ORAL
  Filled 2021-01-15: qty 1

## 2021-01-15 MED ORDER — ACETAMINOPHEN 325 MG PO TABS
650.0000 mg | ORAL_TABLET | Freq: Four times a day (QID) | ORAL | Status: DC | PRN
  Administered 2021-01-15: 650 mg via ORAL
  Filled 2021-01-15: qty 2

## 2021-01-15 MED ORDER — METOCLOPRAMIDE HCL 5 MG/ML IJ SOLN
10.0000 mg | Freq: Once | INTRAMUSCULAR | Status: AC
  Administered 2021-01-15: 10 mg via INTRAMUSCULAR
  Filled 2021-01-15: qty 2

## 2021-01-15 MED ORDER — MECLIZINE HCL 25 MG PO TABS: 25.0000 mg | ORAL_TABLET | Freq: Three times a day (TID) | ORAL | 0 refills | Status: DC | PRN

## 2021-01-15 NOTE — ED Notes (Signed)
Pt ambulated Around the room without stumbling.  She states she still feels dizzy but not as bad as earlier.

## 2021-01-15 NOTE — ED Provider Notes (Signed)
[facility] EMERGENCY DEPARTMENT Provider Note   CSN: [id number] Arrival date & time: 01/15/21  1243     History Chief Complaint  Patient presents with   Dizziness   Headache    Erica Rosario is a 45 y.o. female with PMH of L sided intracerebral hemorrhage, thrombosis, seizures, presents with migraine x1 week. She states she started her menstrual cycle last Saturday and was heavy, and had headache with dizziness that went away Sunday. On Monday got dizzy again and felt face hurting. If laying down down feels fine, but when getting up feels like she is about to pass out. This morning endorses worsening of her headache. States the light makes it worse. States the pain is in the bottom of head in the back and frontal region. States since the stroke has flashes of brightness of her right eye which now feels brighter. Tried Tylenol and used cold compress which helped initially but pain comes back after 3 or 4 hours. Denies any extremity weakness or loss of sensation. States her stroke was in October of 2021 and only residual effects is vision, cant see well on right side. She states she came to the ED in March for similar  symptoms, was told it was iron deficiency anemia. Follows with Neurology, next appt in 3 days.    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 No pertinent surgical history.   OB History   No obstetric history on file.     Family History  Problem Relation Age of Onset   Breast cancer Neg Hx     Social History   Tobacco Use   Smoking status: Never   Smokeless tobacco: Never  Substance Use Topics   Alcohol use: Not Currently   Drug use: Never    Home Medications Prior to Admission medications   Medication Sig Start Date End Date Taking? Authorizing Provider  atorvastatin (LIPITOR) 40 MG tablet TAKE 1 TABLET (40 MG TOTAL) BY MOUTH DAILY. Patient taking differently: Take 40 mg by mouth daily. 04/02/20 04/02/21 Yes Angiulli, Lavon Paganini, PA-C  Ferrous Sulfate (IRON) 325 (65 Fe) MG TABS Take 325 mg by mouth daily. 08/31/20  Yes [provider]  levETIRAcetam (KEPPRA) 500 MG tablet TAKE 1 TABLET (500 MG TOTAL) BY MOUTH TWO TIMES DAILY. Patient taking differently: Take 500 mg by mouth 2 (two) times daily. 04/02/20 04/02/21 Yes Angiulli, Lavon Paganini, PA-C  pantoprazole (PROTONIX) 40 MG tablet Take 1 tablet (40 mg total) by mouth daily. 04/03/20  Yes Jamse Arn, MD  warfarin (COUMADIN) 5 MG tablet Take 1 tablet (5 mg total) by mouth one time only at 4 PM. Take as instructed 04/11/20  Yes Rinehuls, Early Chars, PA-C  topiramate (TOPAMAX) 25 MG tablet Take 1 tablet (25 mg total) by mouth 2 (two) times daily. 04/03/20 04/11/20  Jamse Arn, MD    Allergies    Patient has no known allergies.  Review of Systems   Review of Systems  Constitutional:  Negative for fever.  Eyes:  Positive for visual disturbance.  Cardiovascular:  Negative for chest pain.  All other systems reviewed and are negative.  Physical Exam Updated Vital Signs BP (!) 105/57 (BP Location: Right Arm)   Pulse (!) 55   Temp 97.9 F (36.6 C) (Oral)   Resp 20   SpO2 97%   Physical  Exam Constitutional:      General: She is not in acute distress. HENT:     Head: Normocephalic and atraumatic.  Eyes:     General: No scleral icterus.    Extraocular Movements: Extraocular movements intact.     Pupils: Pupils are equal, round, and reactive to light.  Cardiovascular:     Rate and Rhythm: Normal rate and regular rhythm.     Heart sounds: Normal heart sounds.  Pulmonary:     Effort: Pulmonary effort is normal.     Breath sounds: Normal breath sounds.  Abdominal:     General: There is no distension.     Palpations: Abdomen is soft.  Musculoskeletal:        General: Normal range of motion.     Cervical back: Normal range of motion and neck supple.  Skin:    General: Skin is warm and dry.  Neurological:     Mental Status: She is alert.     Cranial Nerves: No cranial nerve deficit.     Sensory: No sensory deficit.     Motor: No weakness.    ED Results / Procedures / Treatments   Labs (all labs ordered are listed, but only abnormal results are displayed) Labs Reviewed  CBC WITH DIFFERENTIAL/PLATELET - Abnormal; Notable for the following components:      Result Value   RBC 3.66 (*)    MCV 100.5 (*)    All other components within normal limits  PROTIME-INR - Abnormal; Notable for the following components:   Prothrombin Time 27.4 (*)    INR 2.5 (*)    All other components within normal limits  RESP PANEL BY RT-PCR (FLU A&B, COVID) ARPGX2  BASIC METABOLIC PANEL  I-STAT BETA HCG BLOOD, ED ([facility], [facility], [facility] ONLY)  POC URINE PREG, ED    EKG EKG Interpretation  Date/Time:  Friday January 15 2021 13:09:00 EDT Ventricular Rate:  63 PR Interval:  134 QRS Duration: 90 QT Interval:  420 QTC Calculation: 429 R Axis:   12 Text Interpretation: Normal sinus rhythm Normal ECG Confirmed by Lajean Saver [phone]) on 01/15/2021 4:10:04 PM  Radiology No results found.  Procedures Procedures   Medications Ordered in ED Medications  acetaminophen (TYLENOL) tablet 650 mg  (has no administration in time range)    ED Course  I have reviewed the triage vital signs and the nursing notes.  Pertinent labs & imaging results that were available during my care of the patient were reviewed by me and considered in my medical decision making (see chart for details).    MDM Rules/Calculators/A&P                           Ms Freemont is a 45 yo with PMH of CVA, seizures, presents with migraines x1 week. Endorses pain  mainly in the back of her head but also in the frontal area with R eye vision changes. On presentation vitals are stable, with normal neuro exam. Given Hx of intracerebral hemorrhage, currently on Warfarin, will obtain head CT.  EKG NSR. Gave Tylenol 650 mg x 1.   Sign out given to night ED provider who will resume care of patient    Final Clinical Impression(s) / ED Diagnoses Final diagnoses:  None    Rx / DC Orders ED Discharge Orders     None        Shary Key, DO 01/15/21 1654    Lajean Saver, MD 01/16/21 1253

## 2021-01-15 NOTE — ED Notes (Signed)
DC instructions reviewed with pt using interpreter.  Pt verbalized understanding.  PT DC.

## 2021-01-15 NOTE — ED Triage Notes (Signed)
Pt reports dizziness since Monday with intermittent headaches. Pt reports worsening symptoms when she moves around or even moves her head from side to side. Pt reports similar symptoms last fall and was diagnosed with a stroke. Pt denies headache at this time. No neuro deficits noted in triage.

## 2021-01-15 NOTE — ED Provider Notes (Signed)
Emergency Medicine Provider Triage Evaluation Note  Erica Rosario , a 45 y.o. female  was evaluated in triage.  Pt complains of dizziness and headache that she woke up with this morning. Denies vision changes. Reports nausea, but denies vomiting, unilateral numbness/weakness. She further reports subjective fevers at home.   Review of Systems  Positive: Headache, dizziness, fevers, nausea Negative: Vision changes, numbness, weakness, vomiting  Physical Exam  BP (!) 117/57   Pulse 66   Temp 99.1 F (37.3 C) (Oral)   Resp 18   SpO2 100%  Gen:   Awake, no distress   Resp:  Normal effort  MSK:   Moves extremities without difficulty  Other:  No facial droop, clear speech, 5/5 strength to the bue/ble, normal sensation throughout  Medical Decision Making  Medically screening exam initiated at 1:18 PM.  Appropriate orders placed.  Erica Rosario was informed that the remainder of the evaluation will be completed by another provider, this initial triage assessment does not replace that evaluation, and the importance of remaining in the ED until their evaluation is complete.     Rodney Booze, PA-C 01/15/21 1322    Lajean Saver, MD 01/16/21 1253

## 2021-01-15 NOTE — ED Notes (Signed)
Pt back form CT

## 2021-01-15 NOTE — Discharge Instructions (Addendum)
The blood work and CAT scan today were all normal.  There was no sign of new stroke or bleeding.  The symptoms you describe sounds like vertigo as it is worse when you try to move your head or stand and walk.  You can take the medication meclizine as needed if you are feeling dizzy.  It is also okay to continue taking Tylenol for headache.

## 2021-01-15 NOTE — ED Notes (Signed)
Patient transported to CT 

## 2021-01-15 NOTE — ED Provider Notes (Signed)
Assumed care of patient at 430.  Patient is a 45 year old female with a prior history of hemorrhagic stroke who is on seizure prophylaxis who is presenting today with a 5-day history of dizziness and then a headache that started today.  She describes the dizziness as a spinning sensation that is only present if she moves her head too fast or stands and tries to work or walk.  She has had some nausea but denies any vomiting.  No unilateral numbness or weakness.  No visual changes.  Patient's labs are reassuring, head CT without acute pathology.  Patient symptoms sound like peripheral vertigo based on her symptoms and exam.  Will give meclizine and recheck for symptom improvement.  7:41 PM The dizziness is improved after meclizine but she reports she is still having a headache.  She was given IM Reglan.  At this time NSAIDs are indicated due to her Coumadin use.  She has no ataxia with walking and appears steady on her feet.  Feel it is reasonable for discharge home to follow-up with her PCP on Monday as planned.   Blanchie Dessert, MD 01/15/21 [phone]

## 2021-01-18 ENCOUNTER — Encounter: Payer: Self-pay | Admitting: Adult Health

## 2021-01-18 ENCOUNTER — Ambulatory Visit: Payer: Medicaid Other | Admitting: Adult Health

## 2021-01-18 ENCOUNTER — Other Ambulatory Visit: Payer: Self-pay

## 2021-01-18 VITALS — BP 110/66 | HR 72 | Ht 60.0 in | Wt 141.0 lb

## 2021-01-18 DIAGNOSIS — I639 Cerebral infarction, unspecified: Secondary | ICD-10-CM | POA: Diagnosis not present

## 2021-01-18 DIAGNOSIS — D6859 Other primary thrombophilia: Secondary | ICD-10-CM | POA: Diagnosis not present

## 2021-01-18 DIAGNOSIS — G43831 Menstrual migraine, intractable, with status migrainosus: Secondary | ICD-10-CM

## 2021-01-18 DIAGNOSIS — R569 Unspecified convulsions: Secondary | ICD-10-CM

## 2021-01-18 DIAGNOSIS — G08 Intracranial and intraspinal phlebitis and thrombophlebitis: Secondary | ICD-10-CM | POA: Diagnosis not present

## 2021-01-18 MED ORDER — LEVETIRACETAM 500 MG PO TABS: 500.0000 mg | ORAL_TABLET | Freq: Two times a day (BID) | ORAL | 6 refills | Status: DC

## 2021-01-18 MED ORDER — UBRELVY 100 MG PO TABS: 100.0000 mg | ORAL_TABLET | Freq: Every day | ORAL | 6 refills | Status: DC | PRN

## 2021-01-18 MED ORDER — ASPIRIN EC 81 MG PO TBEC: 81.0000 mg | DELAYED_RELEASE_TABLET | Freq: Every day | ORAL | 11 refills | Status: DC

## 2021-01-18 NOTE — Progress Notes (Signed)
[facility] [address] [place]. [place] [address] [phone]       STROKE FOLLOW UP NOTE  [name] Date of Birth:  September 05, 1975 Medical Record Number:  [id number]   Reason for Referral: stroke follow up    SUBJECTIVE:   CHIEF COMPLAINT:  Chief Complaint  Patient presents with   Follow-up    Rm 2 with sister Erica Rosario (& [facility] interpreter)  Pt is well, still having a lot of headaches and dizziness during menstrual cycle.      HPI:   Today, 01/18/2021, Erica Rosario returns for 36-monthstroke follow-up accompanied by sister and [facility].  [facility]iness and occipital and frontal headache associated with bright lights in right eye over the past week.  Seen in ED for similar symptoms on 3/7 and diagnosed with iron deficiency anemia possibly in setting of heavy menstrual cycle.  Started menstrual cycle on 7/23 but symptoms much worse compared to prior symptoms. Dizziness improved after meclizine and received IM Reglan for headache.  CT head unremarkable for acute findings.  Lab work unremarkable.  Symptoms have since resolved.  Is scheduled for IUD implant on 8/8 due to continued heavy irregular menstrual cycles.  Of note, she did have IUD placed 7/12 to help control menstrual cycles with heavy bleeding.  She describes the dizziness as a lightheaded feeling.  She has had vertigo in the past and reports different dizziness symptoms associated with menstrual cycle.  Residual stroke deficit right peripheral visual impairment stable.  Denies any other new stroke/TIA symptoms. Compliant on warfarin with reported heavy menstrual cycle since starting and atorvastatin without associated side effects. Blood pressure today 110/66. Reports complaince on keppra '500mg'$  BID without associated side effects and denies any seizure activity.  No further concerns at this time.   History provided for reference purposes only Update  09/09/2020 JM: Mrs. MRegisterreturns for stroke follow-up after prior visit approximately 4 months ago accompanied by interpreter.   Residual deficits of mild right peripheral visual impairment but is improving.  She denies residual right hand numbness Evaluated in the ED 08/24/2020 with headache, dizziness and visual complaints. MRI and MRV unremarkable for acute findings.  Noted to be anemic with low iron levels which was felt possibly related to heavy menstrual cycles. Headache and dizziness have since resolved. She has since f/u with OB/GYN with plans on placing IUD to help control menstrual cycles.  Also follow-up with PCP who initiated ferrous sulfate. Has f/u next week for repeat lab work Denies new stroke/TIA symptoms  reports bilateral hand finger joint pain with increased stiffness and quick lasting "tremor" with extension of the fingers upon awakening which has been present over the past month.  Denies any weakness, pain in other locations such as wrist or arm or numbness/tingling  Continues on Keppra 500 mg twice daily -tolerating denies any recent seizure activity  Remains on warfarin with INR levels monitored by PCP- per patient, INR level stable - unable to view via epic - aside from heavy menstrual cycles, no other bleeding or bruising concerns Remains on atorvastatin -denies associated side effects  Blood pressure today 110/67 - does not routinely monitor at home  No further concerns at this time  Initial visit 05/12/2020 JM: Erica Rosario being seen for hospital follow-up accompanied by her brother and interpreter. She reports she has been improving but continues to have right hand numbness, right peripheral visual impairment and gait impairment due  to visual difficulty. She has not done any therapies since discharge due to lack of insurance.  She is in the process of applying for [employer] financial assistance as well as other insurance..  Denies new or worsening stroke/TIA symptoms.  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  She lives with her husband and 2 sons able to maintain ADLs independently and has been slowly returning to completing IADLs.  She continues on Keppra 500 mg twice daily tolerating well and denies any reoccurring seizure activity.  She remains on warfarin without bleeding or bruising with INR levels monitored by PCP weekly. Per patient, INR levels have been greater than 3 so currently doing dosage adjustments per PCP. Remains on atorvastatin without myalgias. Blood pressure today 114/68 - not routinely monitored at home.  She has since discontinued OCP.  No further concerns at this time.  Stroke admission 03/25/2020 JM: Erica Rosario is a 45 y.o. female with no PMH  who presented to [facility] with HA and neck stiffness on 03/25/2020.   Personally reviewed hospitalization pertinent progress notes, lab work and imaging with summary provided.  Shortly transferred to [facility] and evaluated by Dr. 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  Repeat CT head showed hemorrhagic transformation L parietal with slight increase edema with most hemorrhagic foci unchanged and single new hemorrhage at vertex.  Repeat CT head 3 hours later stable. Pan CT negative for malignancy but did show B renal vein nonocclusive thrombosis. CTV and CTA h/n negative LVO with partial thrombosis of the superior sagittal sinus at the vertex, nonocclusive as well can be demonstrated around the margins of the thrombus with patency beyond that.  Also showed thrombosed superficial draining vein of the left again visible, similar in appearance with low density edema slightly increased and mild swelling with mass-effect and 1 to 2 mm of left-to-right shift and flattening of the left lateral ventricle.   Questionable hypercoagulable state with hypercoagulable showed anticardiolipin IgM 19 (indeterminate) and protein S total and active mildly low but likely inaccurate on heparin.  Advise to discontinue OCP use.  Placed on heparin IV and warfarin daily. GTC in ER s/p Ativan and Keppra load and initiated Keppra 500 mg twice daily with LTM showing left frontal temporal continuous slowing and bilateral parieto-occipital spikes.  LDL UTC with direct LDL of 151 no statin PTA and initiated atorvastatin 40 mg daily.  No prior history of HTN or DM.  Other stroke risk factors include history of EtOH use and overweight but no prior stroke history.  Other active problems include anemia, leukocytosis and hypokalemia.  Evaluated by therapies and recommended discharge to CIR for ongoing therapy needs.  She was discharged to CIR on 03/31/2020 and discharged home on 04/03/2020 on Keppra, Topamax and warfarin.    She unfortunately returned on 04/04/2020 with complaint of headache, generalized weakness and "faintness" as well as nauseated and several episodes of emesis but no focal deficits noted.  She was found to have worsening vasogenic edema in left parieto-occipital region and worsening midline shift of 13 mm.  Warfarin discontinued and reversed with vitamin K.  Administering mannitolx1, Decadron, hypertonic saline and heparin drip.  Repeat CT head showed improved MLS, cerebral edema and decreased hydrocephalus and again repeat CT head showed stable edema with decreasing IPH and same 1 cm midline shift.  NSG on board PRN.  Also found to have severe anemia receiving 2U PRBC with improvement potentially contributing to syncopal event on 10/15 at home.  Elevated liver enzymes of unclear etiology possibly related to Topamax therefore DC'd.  Restarted warfarin with INR goal 2-3 in addition to Lovenox for total of 4 doses.  Advised to follow-up with [facility] on 10/25 for pro time blood test and CBC as well  as INR levels and adjustment of warfarin as indicated and possible discontinuation of Lovenox.  Discharged on dexamethasone.  Reevaluated by therapy recommended outpatient OT and no PT needs.  She was discharged home on 04/11/2020.   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 Also thrombosis draining cortical vein. 2 areas enhancement in L occipital hemorrhage possible ongoing hemorrhage. Similar edema w/ slight R shift. CTA and CTV 10/9 - Partial thrombosis of the superior sagittal sinus at the vertex, Thrombosed superficial draining vein on the left, Hemorrhagic infarction in the left parietal cortical and subcortical brain  Pan CT neg for malignancy, B renal vein nonocclusive thrombosis. CTV 10/16 Decreased volume of nonocclusive superior sagittal sinus Thrombus. Decreased conspicuity of thrombus within an adjacent left cortical vein. Repeat CT head improved MLS, cerebral edema and decreased hydrocephalus  2D Echo EF 60-65%. No source of embolus  LE dopplers 10/19 - no DVT  LDL UTC, TC 210, TG 132, direct LDL 151.9 HgbA1c 5.6 UDS +benzos Hypercoagulable labs unremarkable (anticardiolipin IgM slightly elevated, prot S total and activity mildly low - not accurate on heparin) VTE prophylaxis - heparin IV   No antithrombotic prior to admission, now on full dose lovenox. On coumadin with INR goal 2-3.  Today INR 1.5 Decadron '4mg'$  IV q6h ->'2mg'$  IV q 6h -> '2mg'$  po Q6h -> '2mg'$  Q8  Therapy recommendations:  Outpt OT, no PT - gave pt info for pro bono therapies at [facility] clinic Disposition:  home  Cerebral Edema CT head Left parieto-occipital venous infarct with increased edema and mass effect including 13 mm of  rightward midline shift. New mild dilatation of the right lateral ventricle suggesting early trapping Repeat CT improved MLS, cerebral edema and decreased hydrocephalus CT repeat stable edema, decreasing IPH. Same 1cm midline shift  NSG on board PRN S/p mannitol x 1 Decadron '10mg'$  x 1 -> '4mg'$  Q6 -> '2mg'$  Q6 ->'2mg'$  Q8 -> tapering On 3% at 75 -> 40 cc / hr -> 20cc->off Increase topomax to '50mg'$  bid -> d/c PICC 10/18->10/22 removed      ROS:   14 system review of systems performed and negative with exception of those listed in HPI  PMH:  Past Medical History:  Diagnosis Date   Intracerebral hemorrhage (HCC)     PSH: History reviewed. No pertinent surgical history.  Social History:  Social History   Socioeconomic History   Marital status: Married    Spouse name: Not on file   Number of children: Not on file   Years of education: Not on file   Highest education level: Not on file  Occupational History   Not on file  Tobacco Use   Smoking status: Never   Smokeless tobacco: Never  Substance and Sexual Activity   Alcohol use: Not Currently   Drug use: Never  Sexual activity: Not on file  Other Topics Concern   Not on file  Social History Narrative   Not on file   Social Determinants of Health   Financial Resource Strain: Not on file  Food Insecurity: Not on file  Transportation Needs: Not on file  Physical Activity: Not on file  Stress: Not on file  Social Connections: Not on file  Intimate Partner Violence: Not on file    Family History:  Family History  Problem Relation Age of Onset   Breast cancer Neg Hx     Medications:   Current Outpatient Medications on File Prior to Visit  Medication Sig Dispense Refill   atorvastatin (LIPITOR) 40 MG tablet TAKE 1 TABLET (40 MG TOTAL) BY MOUTH DAILY. (Patient taking differently: Take 40 mg by mouth daily.) 30 tablet 0   Ferrous Sulfate (IRON) 325 (65 Fe) MG TABS Take 325 mg by mouth daily.     levETIRAcetam (KEPPRA) 500  MG tablet TAKE 1 TABLET (500 MG TOTAL) BY MOUTH TWO TIMES DAILY. (Patient taking differently: Take 500 mg by mouth 2 (two) times daily.) 60 tablet 0   meclizine (ANTIVERT) 25 MG tablet Take 1 tablet (25 mg total) by mouth 3 (three) times daily as needed for dizziness. 20 tablet 0   pantoprazole (PROTONIX) 40 MG tablet Take 1 tablet (40 mg total) by mouth daily. 30 tablet 0   warfarin (COUMADIN) 5 MG tablet Take 1 tablet (5 mg total) by mouth one time only at 4 PM. Take as instructed 30 tablet 1   [DISCONTINUED] topiramate (TOPAMAX) 25 MG tablet Take 1 tablet (25 mg total) by mouth 2 (two) times daily. 60 tablet 0   No current facility-administered medications on file prior to visit.    Allergies:  No Known Allergies    OBJECTIVE:  Physical Exam  Vitals:   01/18/21 0938  BP: 110/66  Pulse: 72  Weight: 141 lb (64 kg)  Height: 5' (1.524 m)    Body mass index is 27.54 kg/m. No results found.  General: well developed, well nourished, very pleasant middle-aged Hispanic female, seated, in no evident distress Head: head normocephalic and atraumatic.   Neck: supple with no carotid or supraclavicular bruits Cardiovascular: regular rate and rhythm, no murmurs Musculoskeletal: no deformity Skin:  no rash/petichiae Vascular:  Normal pulses all extremities   Neurologic Exam Mental Status: Awake and fully alert. Limited English primarily Spanish-speaking. Denies dysarthria or aphasia.  Oriented to place and time. Recent and remote memory intact. Attention span, concentration and fund of knowledge appropriate. Mood and affect appropriate.  Cranial Nerves: Pupils equal, briskly reactive to light. Extraocular movements full without nystagmus. Visual fields right homonymous inferior quadrantanopia. Hearing intact. Facial sensation intact. Face, tongue, palate moves normally and symmetrically.  Motor: Normal bulk and tone. Normal strength in all tested extremity muscles. Sensory.: intact to  touch , pinprick , position and vibratory sensation.  Coordination: Rapid alternating movements normal in all extremities. Finger-to-nose and heel-to-shin performed accurately bilaterally.  No evidence of action or rest tremors or abnormal hand movements Gait and Station: Arises from chair without difficulty. Stance is normal. Gait demonstrates normal stride length and balance without use of assistive device.  Able to perform tandem walk and heel toe without difficulty.   Reflexes: 1+ and symmetric. Toes downgoing.       ASSESSMENT: Erica Rosario is a 45 y.o. year old female initially presented on 03/25/2020 with headache and neck stiffness with evidence of superior sagittal sinus thrombosis  with resultant hemorrhagic conversion of left occipital venous infarct of unknown etiology.  Questionable hypercoagulable state with evidence of nonocclusive thrombosis of bilateral renal vein and anticardiolipin IgM 19 (indeterminate) and OCP use. Also started on Keppra for GTC and EEG showing bilateral parieto-occipital spikes.  She returned on 04/04/2020 (d/c'd home from [place] on 10/15 in stable condition) with worsening headache, N/V and generalized weakness found to have worsening vasogenic edema in left parieto-occipital region and worsening midline shift of 13 mm requiring reversal of warfarin, mannitol x1, Decadron and hypertonic saline with improvement.  Hospital course complicated by severe anemia, leukocytosis and fever, and elevated liver enzymes (? R/t Topamax).  Vascular risk factors include anemia, HLD and OCP use.     PLAN:  Sagittal sinus thrombosis w/ hemorrhagic transformation:  Residual deficit: Right inferior homonymous quadrantanopia  MRV 08/24/2020 no evidence of residual thrombosis Completed 61-monthduration of warfarin -advised to discontinue and initiate aspirin 81 mg daily and continue atorvastatin for secondary stroke prevention.  Discussed secondary stroke prevention measures and  importance of close PCP follow up for aggressive stroke risk factor management  HLD: LDL goal<70.  On atorvastatin 10 mg daily. Has f/u with PCP next week for repeat lab work -request lipid panel be obtained at that time as well if able ?  Hypercoagulable state: Plan on repeating labs 1 week after stopping warfarin Menstrual migraines: Recommend trialing Ubrelvy '100mg'$  daily during menses.  Dizziness could be associated with migraine although discussed importance of ensuring adequate water and nutritional intake during menses cycle. Hopeful resolution of symptoms after IUD placement Seizure, with stroke onset: No reoccurring seizure activity.  Continue Keppra 500 mg twice daily for seizure prophylaxis Iron deficiency anemia: Chronic history.  Possibly in setting of heavy menstrual cycles -scheduled for IUD placement 8/8.  Currently on ferrous sulfate supplement.  Has follow-up with PCP next week for repeat lab work     Follow up in 6 months or call earlier if needed   CC:  GDuPontprovider: Dr. SLeonie ManCenter, [place]   I spent 34 minutes of face-to-face and non-face-to-face time with patient, sister and interpreter.  This included previsit chart review, lab review, study review, order entry, electronic health record documentation, patient education regarding prior stroke and residual deficits, secondary stroke prevention measures and aggressive stroke risk factor management, indication for repeat lab work, menstrual migraines and further treatment options and answered all other questions to patients satisfaction  JFrann Rider AGNP-BC  [facility] [address]add[place] [address] Phone [phone] [phone]: This document was prepared with digital dictation and possible smart phrase technology. Any transcriptional errors that result from this process are unintentional.

## 2021-01-18 NOTE — Patient Instructions (Addendum)
Your Plan:  Start Ubrelvy '100mg'$  daily during menstrual period   Use of meclizine 3 times daily as needed for dizziness  Please ensure that you are drinking enough water (at least 64 oz) and adequate nutrition to help prevent dizziness   You can stop warfarin today and start on aspirin '81mg'$  daily - we will plan on doing lab work next week to rule out any type of clotting disorder   Continue keppra '500mg'$  twice daily for seizure prevention    Follow up in 6 months or call earlier if needed     Thank you for coming to see [place] at [facility]. I hope we have been able to provide you high quality care today.  You may receive a patient satisfaction survey over the next few weeks. We would appreciate your feedback and comments so that we may continue to improve ourselves and the health of our patients.

## 2021-01-19 ENCOUNTER — Telehealth: Payer: Self-pay | Admitting: *Deleted

## 2021-01-19 NOTE — Telephone Encounter (Signed)
Roselyn Meier PA through [place] [employer]. Form on NP desk for signature.

## 2021-01-19 NOTE — Telephone Encounter (Signed)
Roselyn Meier PA signed and faxed to [facility].

## 2021-01-21 NOTE — Progress Notes (Signed)
I agree with the above plan 

## 2021-01-21 NOTE — Telephone Encounter (Signed)
Called [place] [employer]- 01/19/2021 PA denied. Per [place] tracts rep, The Migraine Calcitonin Agents: Acute Treatment-Ubrelvy/Nurtec form would need to be completed.   I have filled out and faxed to # [phone], confirmation received, will follow.

## 2021-01-26 NOTE — Telephone Encounter (Signed)
PA for Nurtuc has been approved through [facility] tracts Prior Approval # [id number].  Effective dates  01/21/2021 - 01/16/2022

## 2021-04-02 ENCOUNTER — Emergency Department: Payer: 59

## 2021-04-02 ENCOUNTER — Encounter: Payer: Self-pay | Admitting: Radiology

## 2021-04-02 ENCOUNTER — Inpatient Hospital Stay
Admission: EM | Admit: 2021-04-02 | Discharge: 2021-04-03 | DRG: 101 | Disposition: A | Discharge status: Home / Self Care | Payer: 59 | Attending: Internal Medicine | Admitting: Internal Medicine

## 2021-04-02 DIAGNOSIS — I69298 Other sequelae of other nontraumatic intracranial hemorrhage: Secondary | ICD-10-CM

## 2021-04-02 DIAGNOSIS — G40209 Localization-related (focal) (partial) symptomatic epilepsy and epileptic syndromes with complex partial seizures, not intractable, without status epilepticus: Secondary | ICD-10-CM | POA: Diagnosis present

## 2021-04-02 DIAGNOSIS — Z7982 Long term (current) use of aspirin: Secondary | ICD-10-CM

## 2021-04-02 DIAGNOSIS — Z79899 Other long term (current) drug therapy: Secondary | ICD-10-CM

## 2021-04-02 DIAGNOSIS — R200 Anesthesia of skin: Secondary | ICD-10-CM | POA: Diagnosis present

## 2021-04-02 DIAGNOSIS — Z8679 Personal history of other diseases of the circulatory system: Secondary | ICD-10-CM

## 2021-04-02 DIAGNOSIS — I69212 Visuospatial deficit and spatial neglect following other nontraumatic intracranial hemorrhage: Secondary | ICD-10-CM | POA: Diagnosis not present

## 2021-04-02 DIAGNOSIS — R569 Unspecified convulsions: Secondary | ICD-10-CM

## 2021-04-02 DIAGNOSIS — G9389 Other specified disorders of brain: Secondary | ICD-10-CM | POA: Diagnosis present

## 2021-04-02 DIAGNOSIS — D6859 Other primary thrombophilia: Secondary | ICD-10-CM | POA: Diagnosis present

## 2021-04-02 DIAGNOSIS — G40909 Epilepsy, unspecified, not intractable, without status epilepticus: Secondary | ICD-10-CM | POA: Diagnosis present

## 2021-04-02 DIAGNOSIS — R531 Weakness: Secondary | ICD-10-CM | POA: Diagnosis present

## 2021-04-02 DIAGNOSIS — E785 Hyperlipidemia, unspecified: Secondary | ICD-10-CM | POA: Diagnosis present

## 2021-04-02 DIAGNOSIS — Z86718 Personal history of other venous thrombosis and embolism: Secondary | ICD-10-CM

## 2021-04-02 DIAGNOSIS — R29818 Other symptoms and signs involving the nervous system: Secondary | ICD-10-CM

## 2021-04-02 DIAGNOSIS — Z87898 Personal history of other specified conditions: Secondary | ICD-10-CM

## 2021-04-02 DIAGNOSIS — R4182 Altered mental status, unspecified: Secondary | ICD-10-CM | POA: Diagnosis present

## 2021-04-02 DIAGNOSIS — Z20822 Contact with and (suspected) exposure to covid-19: Secondary | ICD-10-CM | POA: Diagnosis present

## 2021-04-02 HISTORY — DX: Other symptoms and signs involving the nervous system: R29.818

## 2021-04-02 LAB — RESP PANEL BY RT-PCR (FLU A&B, COVID) ARPGX2
Influenza A by PCR: NEGATIVE
Influenza B by PCR: NEGATIVE
SARS Coronavirus 2 by RT PCR: NEGATIVE

## 2021-04-02 LAB — COMPREHENSIVE METABOLIC PANEL WITH GFR
ALT: 39 U/L (ref 0–44)
AST: 36 U/L (ref 15–41)
Albumin: 4.6 g/dL (ref 3.5–5.0)
Alkaline Phosphatase: 113 U/L (ref 38–126)
Anion gap: 20 — ABNORMAL HIGH (ref 5–15)
BUN: 16 mg/dL (ref 6–20)
CO2: 19 mmol/L — ABNORMAL LOW (ref 22–32)
Calcium: 10 mg/dL (ref 8.9–10.3)
Chloride: 106 mmol/L (ref 98–111)
Creatinine, Ser: 0.87 mg/dL (ref 0.44–1.00)
GFR, Estimated: >60 mL/min
Glucose, Bld: 144 mg/dL — ABNORMAL HIGH (ref 70–99)
Potassium: 3.7 mmol/L (ref 3.5–5.1)
Sodium: 145 mmol/L (ref 135–145)
Total Bilirubin: 0.4 mg/dL (ref 0.3–1.2)
Total Protein: 8.3 g/dL — ABNORMAL HIGH (ref 6.5–8.1)

## 2021-04-02 LAB — DIFFERENTIAL
Abs Immature Granulocytes: 0.1 10*3/uL — ABNORMAL HIGH (ref 0.00–0.07)
Basophils Absolute: 0 10*3/uL (ref 0.0–0.1)
Basophils Relative: 0 %
Eosinophils Absolute: 0.1 10*3/uL (ref 0.0–0.5)
Eosinophils Relative: 1 %
Immature Granulocytes: 1 %
Lymphocytes Relative: 51 %
Lymphs Abs: 5.2 10*3/uL — ABNORMAL HIGH (ref 0.7–4.0)
Monocytes Absolute: 0.9 10*3/uL (ref 0.1–1.0)
Monocytes Relative: 9 %
Neutro Abs: 3.9 10*3/uL (ref 1.7–7.7)
Neutrophils Relative %: 38 %

## 2021-04-02 LAB — CBC
HCT: 42.9 % (ref 36.0–46.0)
Hemoglobin: 13.8 g/dL (ref 12.0–15.0)
MCH: 32.2 pg (ref 26.0–34.0)
MCHC: 32.2 g/dL (ref 30.0–36.0)
MCV: 100.2 fL — ABNORMAL HIGH (ref 80.0–100.0)
Platelets: 246 K/uL (ref 150–400)
RBC: 4.28 MIL/uL (ref 3.87–5.11)
RDW: 12.3 % (ref 11.5–15.5)
WBC: 10.2 K/uL (ref 4.0–10.5)
nRBC: 0 % (ref 0.0–0.2)

## 2021-04-02 LAB — CBG MONITORING, ED: Glucose-Capillary: 140 mg/dL — ABNORMAL HIGH (ref 70–99)

## 2021-04-02 LAB — TROPONIN I (HIGH SENSITIVITY)
Troponin I (High Sensitivity): 4 ng/L (ref <18)
Troponin I (High Sensitivity): 6 ng/L

## 2021-04-02 LAB — PROTIME-INR
INR: 1 (ref 0.8–1.2)
Prothrombin Time: 13.3 seconds (ref 11.4–15.2)

## 2021-04-02 LAB — APTT: aPTT: 28 s (ref 24–36)

## 2021-04-02 LAB — ETHANOL: Alcohol, Ethyl (B): <10 mg/dL (ref <10)

## 2021-04-02 MED ORDER — IOHEXOL 350 MG/ML SOLN
100.0000 mL | Freq: Once | INTRAVENOUS | Status: AC | PRN
  Administered 2021-04-02: 100 mL via INTRAVENOUS

## 2021-04-02 MED ORDER — SODIUM CHLORIDE 0.9 % IV SOLN
2000.0000 mg | Freq: Once | INTRAVENOUS | Status: AC
  Administered 2021-04-02: 2000 mg via INTRAVENOUS
  Filled 2021-04-02: qty 20

## 2021-04-02 MED ORDER — LORAZEPAM 2 MG/ML IJ SOLN
2.0000 mg | Freq: Once | INTRAMUSCULAR | Status: AC
  Administered 2021-04-02: 2 mg via INTRAVENOUS

## 2021-04-02 MED ORDER — LORAZEPAM 2 MG/ML IJ SOLN
1.0000 mg | Freq: Once | INTRAMUSCULAR | Status: DC
  Filled 2021-04-02: qty 1

## 2021-04-02 NOTE — ED Notes (Signed)
Code stroke called Teleneuro button pushed to initiate telestroke.

## 2021-04-02 NOTE — Progress Notes (Signed)
Interpreter left prior to my arrival, chaplain was unable to communicate with family. Patient was being accessed by Tel conference and being treated by medical staff. Chaplain offered silent prayer for patient and her family.

## 2021-04-02 NOTE — H&P (Addendum)
History and Physical    Erica Rosario VOH:[id number] DOB: 03-Aug-1975 DOA: 04/02/2021  PCP: [facility] [place]   Patient coming from: home  I have personally briefly reviewed patient's old medical records in [place]  Chief Complaint: right upper extremity weakness, seizure  HPI: Erica Rosario is a 45 y.o. 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  Most of the history is taken from the husband and sister at the bedside.  Sister states they were at her son's birthday party on the soccer field and then she started experiencing the symptoms.  She also had difficulty holding a bottle of water that she had and started to feel generalized malaise.  While awaiting triage she had a generalized tonic-clonic seizure.  She was brought back to the general ED s where she was noted to have right-sided deficits and a code stroke was called.  Initial CT was negative for acute hemorrhage.  She was evaluated by teleneurology who recommended CTA head and neck.  Patient went on to have another generalized seizure while in CTA.  She was loaded with a total of 2 g Keppra and received 2 mg Ativan during the second seizure.  Patient awoke after second seizure  Imaging: CTA head and neck was negative.  Venous sinuses patent  Blood work unremarkable  EKG, personally viewed and interpreted: Sinus tachycardia at 100  Teleneurology recommendations: Please see note  At the time of admission patient is awake though drowsy  from recent Ativan.  Soft blood pressure of 104/62, respirations 24 and otherwise unremarkable Patient will be admitted to stepdown.  Review of Systems: Unable to obtain as patient is very somnolent from previously administered Ativan   Past Medical History:  Diagnosis Date   Intracerebral hemorrhage ([place])     No past surgical history on file.   reports that she has never smoked. She has never used smokeless tobacco. She reports that she does not currently use alcohol. She reports that she does not use drugs.  No Known Allergies  Family History  Problem Relation Age of Onset   Breast cancer Neg Hx       Prior to Admission medications   Medication Sig Start Date End Date Taking? Authorizing Provider  aspirin EC 81 MG tablet Take 1 tablet (81 mg total) by mouth daily. Swallow whole. 01/18/21  Yes McCue, Janett Billow, NP  atorvastatin (LIPITOR) 40 MG tablet TAKE 1 TABLET (40 MG TOTAL) BY MOUTH DAILY. Patient taking differently: Take 40 mg by mouth daily. 04/02/20 04/02/21 Yes Angiulli, Lavon Paganini, PA-C  Ferrous Sulfate (IRON) 325 (65 Fe) MG TABS Take 325 mg by mouth daily. 08/31/20  Yes [provider]  levETIRAcetam (KEPPRA) 500 MG tablet Take 1 tablet (500 mg total) by mouth 2 (two) times daily. 01/18/21 01/18/22 Yes McCue, Janett Billow, NP  meclizine (ANTIVERT) 25 MG tablet Take 1 tablet (25 mg total) by mouth 3 (three) times daily as needed for dizziness. 01/15/21  Yes Plunkett, Loree Fee, MD  pantoprazole (PROTONIX) 40 MG tablet Take 1 tablet (40 mg total) by mouth daily. 04/03/20  Yes Jamse Arn, MD  topiramate (TOPAMAX) 25 MG tablet Take 25 mg by mouth 2 (two) times daily.   Yes [provider]  Ubrogepant (UBRELVY) 100 MG TABS Take 100 mg by mouth daily as needed (during menses for migraine prevention). 01/18/21  Yes Frann Rider, NP    Physical Exam: Vitals:   04/02/21 2151 04/02/21 2155 04/02/21 2159 04/02/21 2200  BP: (!) 97/59   (!) 93/58  Pulse:  79 75   Resp:   (!) 21 20   SpO2:  98% 98%      Vitals:   04/02/21 2151 04/02/21 2155 04/02/21 2159 04/02/21 2200  BP: (!) 97/59   (!) 93/58  Pulse:  79 75   Resp:  (!) 21 20   SpO2:  98% 98%       Constitutional: Somnolent but arousable. Not in any apparent distress HEENT:      Head: Normocephalic and atraumatic.         Eyes: PERLA, EOMI, Conjunctivae are normal. Sclera is non-icteric.       Mouth/Throat: Mucous membranes are moist.       Neck: Supple with no signs of meningismus. Cardiovascular: Regular rate and rhythm. No murmurs, gallops, or rubs. 2+ symmetrical distal pulses are present . No JVD. No LE edema Respiratory: Respiratory effort normal .Lungs sounds clear bilaterally. No wheezes, crackles, or rhonchi.  Gastrointestinal: Soft, non tender, and non distended with positive bowel sounds.  Genitourinary: No CVA tenderness. Musculoskeletal: Nontender with normal range of motion in all extremities. No cyanosis, or erythema of extremities. Neurologic:  Face is symmetric. Moving all extremities. No gross focal neurologic deficits . Skin: Skin is warm, dry.  No rash or ulcers Psychiatric: Somnolent, arousable   Labs on Admission: I have personally reviewed following labs and imaging studies  CBC: Recent Labs  Lab 04/02/21 2018  WBC 10.2  NEUTROABS 3.9  HGB 13.8  HCT 42.9  MCV 100.2*  PLT 709   Basic Metabolic Panel: Recent Labs  Lab 04/02/21 2018  NA 145  K 3.7  CL 106  CO2 19*  GLUCOSE 144*  BUN 16  CREATININE 0.87  CALCIUM 10.0   GFR: CrCl cannot be calculated (Unknown ideal weight.). Liver Function Tests: Recent Labs  Lab 04/02/21 2018  AST 36  ALT 39  ALKPHOS 113  BILITOT 0.4  PROT 8.3*  ALBUMIN 4.6   No results for input(s): LIPASE, AMYLASE in the last 168 hours. No results for input(s): AMMONIA in the last 168 hours. Coagulation Profile: Recent Labs  Lab 04/02/21 2018  INR 1.0   Cardiac Enzymes: No results for input(s): CKTOTAL, CKMB,  CKMBINDEX, TROPONINI in the last 168 hours. BNP (last 3 results) No results for input(s): PROBNP in the last 8760 hours. HbA1C: No results for input(s): HGBA1C in the last 72 hours. CBG: Recent Labs  Lab 04/02/21 2010  GLUCAP 140*   Lipid Profile: No results for input(s): CHOL, HDL, LDLCALC, TRIG, CHOLHDL, LDLDIRECT in the last 72 hours. Thyroid Function Tests: No results for input(s): TSH, T4TOTAL, FREET4, T3FREE, THYROIDAB in the last 72 hours. Anemia Panel: No results for input(s): VITAMINB12, FOLATE, FERRITIN, TIBC, IRON, RETICCTPCT in the last 72 hours. Urine analysis:    Component Value Date/Time   COLORURINE YELLOW 04/11/2020 [place] 04/11/2020 1112   LABSPEC 1.012 04/11/2020 1112   PHURINE 6.0 04/11/2020 1112   GLUCOSEU NEGATIVE 04/11/2020 1112   HGBUR NEGATIVE 04/11/2020 1112   [place] 04/11/2020 1112  KETONESUR NEGATIVE 04/11/2020 1112   PROTEINUR NEGATIVE 04/11/2020 1112   UROBILINOGEN 0.2 07/02/2010 0959   NITRITE NEGATIVE 04/11/2020 1112   LEUKOCYTESUR NEGATIVE 04/11/2020 1112    Radiological Exams on Admission: DG Chest Portable 1 View  Result Date: 04/02/2021 CLINICAL DATA:  Altered mental status. EXAM: PORTABLE CHEST 1 VIEW COMPARISON:  April 07, 2020 FINDINGS: The right-sided PICC line seen on the prior study has been removed. Low lung volumes are seen with subsequent crowding of the bronchovascular lung markings. There is no evidence of acute infiltrate, pleural effusion or pneumothorax. The heart size and mediastinal contours are within normal limits. The visualized skeletal structures are unremarkable. IMPRESSION: No active disease. Electronically Signed   By: Virgina Norfolk M.D.   On: 04/02/2021 21:45   CT HEAD CODE STROKE WO CONTRAST  Result Date: 04/02/2021 CLINICAL DATA:  Code stroke.  Right-sided weakness EXAM: CT HEAD WITHOUT CONTRAST TECHNIQUE: Contiguous axial images were obtained from the base of the skull  through the vertex without intravenous contrast. COMPARISON:  None. FINDINGS: Brain: There is no mass, hemorrhage or extra-axial collection. The size and configuration of the ventricles and extra-axial CSF spaces are normal. Unchanged area of posterior left hemisphere encephalomalacia. Vascular: No abnormal hyperdensity of the major intracranial arteries or dural venous sinuses. No intracranial atherosclerosis. Skull: The visualized skull base, calvarium and extracranial soft tissues are normal. Sinuses/Orbits: No fluid levels or advanced mucosal thickening of the visualized paranasal sinuses. No mastoid or middle ear effusion. The orbits are normal. ASPECTS [facility] Stroke Program Early CT Score) - Ganglionic level infarction (caudate, lentiform nuclei, internal capsule, insula, M1-M3 cortex): 7 - Supraganglionic infarction (M4-M6 cortex): 3 Total score (0-10 with 10 being normal): 10 IMPRESSION: 1. No acute intracranial abnormality. 2. ASPECTS is 10. 3. Unchanged area of posterior left hemisphere encephalomalacia. These results were called by telephone at the time of interpretation on 04/02/2021 at 8:35 pm to provider [facility] , who verbally acknowledged these results. Electronically Signed   By: Ulyses Jarred M.D.   On: 04/02/2021 20:37   CT ANGIO HEAD NECK W WO CM W PERF (CODE STROKE)  Result Date: 04/02/2021 EXAM: CT ANGIOGRAPHY HEAD AND NECK CT PERFUSION BRAIN TECHNIQUE: Multidetector CT imaging of the head and neck was performed using the standard protocol during bolus administration of intravenous contrast. Multiplanar CT image reconstructions and MIPs were obtained to evaluate the vascular anatomy. Carotid stenosis measurements (when applicable) are obtained utilizing NASCET criteria, using the distal internal carotid diameter as the denominator. Multiphase CT imaging of the brain was performed following IV bolus contrast injection. Subsequent parametric perfusion maps were calculated using RAPID  software. CONTRAST:  119mL OMNIPAQUE IOHEXOL 350 MG/ML SOLN COMPARISON:  None. FINDINGS: CTA NECK FINDINGS SKELETON: There is no bony spinal canal stenosis. No lytic or blastic lesion. OTHER NECK: Normal pharynx, larynx and major salivary glands. No cervical lymphadenopathy. Unremarkable thyroid gland. UPPER CHEST: No pneumothorax or pleural effusion. No nodules or masses. AORTIC ARCH: There is no calcific atherosclerosis of the aortic arch. There is no aneurysm, dissection or hemodynamically significant stenosis of the visualized portion of the aorta. Conventional 3 vessel aortic branching pattern. The visualized proximal subclavian arteries are widely patent. RIGHT CAROTID SYSTEM: Normal without aneurysm, dissection or stenosis. LEFT CAROTID SYSTEM: Normal without aneurysm, dissection or stenosis. VERTEBRAL ARTERIES: Left dominant configuration. Both origins are clearly patent. There is no dissection, occlusion or flow-limiting stenosis to the skull base (V1-V3 segments). CTA HEAD FINDINGS POSTERIOR CIRCULATION: --Vertebral arteries: Normal V4 segments. --  Inferior cerebellar arteries: Normal. --Basilar artery: Normal. --Superior cerebellar arteries: Normal. --Posterior cerebral arteries (PCA): Normal. ANTERIOR CIRCULATION: --Intracranial internal carotid arteries: Normal. --Anterior cerebral arteries (ACA): Normal. Both A1 segments are present. Patent anterior communicating artery (a-comm). --Middle cerebral arteries (MCA): Normal. VENOUS SINUSES: Patent ANATOMIC VARIANTS: None Review of the MIP images confirms the above findings. CT Brain Perfusion Findings: ASPECTS: 10 CBF (<30%) Volume: 46mL Perfusion (Tmax>6.0s) volume: 24mL Mismatch Volume: 38mL Infarction Location:None IMPRESSION: 1. Normal perfusion scan of the brain. 2. Normal CTA of the head and neck. Electronically Signed   By: Ulyses Jarred M.D.   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 -  generalized tonic-clonic seizure x2, preceded by right upper extremity weakness - Loaded with 2 g Keppra in the ED - Evaluated by teleneurology - Keppra 1000 mg twice daily to increase to 1500 twice daily if recurrent seizure and/all load with fosphenytoin 20 mg/kg - If recurrent rightward gaze deviation get stat EEG to rule out nonconvulsive status epilepticus or transfer for continuous EEG - Maintain euglycemia and normothermia - Seizure precautions, neurochecks - Neurology consult   Acute focal neurological deficit right upper extremity History of intracranial hemorrhage with residual visual field deficit and seizure disorder - CT head negative for hemorrhage and CTA head and neck with no LVO, and overall unremarkable - 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  No evidence of arterial thrombosis or stenosis - Consider hematology consult in the a.m.    DVT prophylaxis: Lovenox  Code Status: full code  Family Communication: Husband and sister at bedside Disposition Plan: Back to previous home environment Consults called: Neurology Status:At the time of admission, it appears that the appropriate admission status for this patient is INPATIENT. This is judged to be reasonable and necessary in order to provide the required intensity of service to ensure the patient's safety given the presenting symptoms, physical exam findings, and initial radiographic and laboratory data in the context of their  Comorbid conditions.   Patient requires inpatient status due to high intensity of service, high risk for further deterioration and high frequency of surveillance required.   I certify that at the point of admission it is my clinical judgment that the patient  will require inpatient hospital care spanning beyond [place] MD [facility]     04/02/2021, 11:56 PM

## 2021-04-02 NOTE — ED Notes (Signed)
This RN escorted pt to CT with cont. cardiac monitoring. While in CT, pt. had full tonic-clonic seizure lasting approximately 30 seconds. this RN turned pt on her side on the CT table and stabilized her while calling charge RN, Merleen Nicely, who brought and administered 2mg  ativan IV to pt. in CT. Also to CT from ED were Dr. Charna Archer, and Liane Comber, RN. Post seizure, pt. Was postictal, nonverbal, has some slightly bloody drool, and aphasic. Pt. Was able to maintain her airway throughout seizure. Pt. Stabilized, CT completed per Dr. Si Raider request, and pt. Transferred back to ED rm. 3. Pt's family updated using interpreter Seth Bake, [place] interp.) on POC and pt's condition.

## 2021-04-02 NOTE — ED Provider Notes (Signed)
[facility] Emergency Department Provider Note   ____________________________________________   Event Date/Time   First MD Initiated Contact with Patient 04/02/21 2016     (approximate)  I have reviewed the triage vital signs and the nursing notes.   HISTORY  Chief Complaint Altered Mental Status (Pt to ED from home with c/o right sided weakness and abdominal pain today. Possible fall. Seizure this evening)    HPI Erica Rosario is a 45 y.o. 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  History is limited due to altered mental status and present family is Spanish-speaking only.  Patient was reportedly at a soccer game around 6:00 this evening when she started to complain of her right side feeling numb and weak along with a headache.  She eventually fell to the ground and was subsequently brought to the ED for further evaluation.  She apparently also complained of abdominal pain around the time of onset of other symptoms.  She was awake and alert upon arrival, but just prior to triage was found to have a generalized tonic-clonic seizure.  Family at bedside reports patient does not have a history of seizures, but they states she had similar symptoms when diagnosed with a stroke last year.  She had been on Coumadin due to history of venous sinus thrombosis, but family states this was stopped about 2 months ago.        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  Prior to Admission medications   Medication Sig Start Date End Date Taking? Authorizing Provider  aspirin EC 81 MG tablet Take 1 tablet (81 mg total) by mouth daily. Swallow whole. 01/18/21  Yes McCue, Janett Billow, NP  atorvastatin (LIPITOR) 40 MG tablet TAKE 1 TABLET (40 MG TOTAL) BY MOUTH DAILY. Patient taking differently: Take 40 mg by mouth daily. 04/02/20 04/02/21 Yes Angiulli, Lavon Paganini, PA-C  Ferrous Sulfate (IRON) 325 (65 Fe) MG TABS Take 325 mg by mouth daily. 08/31/20  Yes [provider]  levETIRAcetam (KEPPRA) 500 MG tablet Take 1 tablet (500 mg total) by mouth 2 (two) times daily. 01/18/21 01/18/22 Yes McCue, Janett Billow, NP  meclizine (ANTIVERT) 25 MG tablet Take 1 tablet (25 mg total) by mouth 3 (three) times daily as needed for dizziness. 01/15/21  Yes Plunkett, Loree Fee, MD  pantoprazole (PROTONIX) 40 MG tablet Take 1 tablet (40 mg total) by mouth daily. 04/03/20  Yes Jamse Arn, MD  topiramate (TOPAMAX) 25 MG tablet Take 25 mg by mouth 2 (two) times daily.   Yes [provider]  Ubrogepant (UBRELVY) 100  MG TABS Take 100 mg by mouth daily as needed (during menses for migraine prevention). 01/18/21  Yes Frann Rider, NP    Allergies Patient has no known allergies.  Family History  Problem Relation Age of Onset   Breast cancer Neg Hx     Social History Social History   Tobacco Use   Smoking status: Never   Smokeless tobacco: Never  Substance Use Topics   Alcohol use: Not Currently   Drug use: Never    Review of Systems Unable  to obtain secondary to altered mental status.  ____________________________________________   PHYSICAL EXAM:  VITAL SIGNS: ED Triage Vitals  Enc Vitals Group     BP      Pulse      Resp      Temp      Temp src      SpO2      Weight      Height      Head Circumference      Peak Flow      Pain Score      Pain Loc      Pain Edu?      Excl. in [place]?     Constitutional: Somnolent, opening eyes spontaneously, not following commands. Eyes: Conjunctivae are normal.  Pupils equal, round, and reactive to light bilaterally.  Eyes tracking with no fixed gaze. Head: Atraumatic. Nose: No congestion/rhinnorhea. Mouth/Throat: Mucous membranes are moist. Neck: Normal ROM Cardiovascular: Normal rate, regular rhythm. Grossly normal heart sounds. Respiratory: Normal respiratory effort.  No retractions. Lungs CTAB. Gastrointestinal: Soft and nondistended. Genitourinary: deferred Musculoskeletal: No lower extremity tenderness nor edema. Neurologic: Nonverbal, unable to follow commands.  Moving all extremities spontaneously except for right upper extremity.  Does not withdraw from pain in right upper extremity. Skin:  Skin is warm, dry and intact. No rash noted. Psychiatric: Unable to assess.  ____________________________________________   LABS (all labs ordered are listed, but only abnormal results are displayed)  Labs Reviewed  CBC - Abnormal; Notable for the following components:      Result Value   MCV 100.2 (*)    All other components within normal limits  DIFFERENTIAL - Abnormal; Notable for the following components:   Lymphs Abs 5.2 (*)    Abs Immature Granulocytes 0.10 (*)    All other components within normal limits  COMPREHENSIVE METABOLIC PANEL - Abnormal; Notable for the following components:   CO2 19 (*)    Glucose, Bld 144 (*)    Total Protein 8.3 (*)    Anion gap 20 (*)    All other components within normal limits  CBG MONITORING, ED - Abnormal; Notable for the  following components:   Glucose-Capillary 140 (*)    All other components within normal limits  RESP PANEL BY RT-PCR (FLU A&B, COVID) ARPGX2  ETHANOL  PROTIME-INR  APTT  URINE DRUG SCREEN, QUALITATIVE ([facility] ONLY)  URINALYSIS, ROUTINE W REFLEX MICROSCOPIC  URINALYSIS, COMPLETE (UACMP) WITH MICROSCOPIC  POC URINE PREG, ED  TYPE AND SCREEN  TROPONIN I (HIGH SENSITIVITY)  TROPONIN I (HIGH SENSITIVITY)   ____________________________________________  EKG  ED ECG REPORT I, Blake Divine, the attending physician, personally viewed and interpreted this ECG.   Date: 04/02/2021  EKG Time: 20:43  Rate: 100  Rhythm: sinus tachycardia  Axis: Normal  Intervals:none  ST&T Change: None   PROCEDURES  Procedure(s) performed (including Critical Care):  .Critical Care Performed by: Blake Divine, MD Authorized by: Blake Divine, MD   Critical care provider statement:  Critical care time (minutes):  75   Critical care time was exclusive of:  Separately billable procedures and treating other patients and teaching time   Critical care was necessary to treat or prevent imminent or life-threatening deterioration of the following conditions:  CNS failure or compromise   Critical care was time spent personally by me on the following activities:  Development of treatment plan with patient or surrogate, discussions with consultants, evaluation of patient's response to treatment, examination of patient, obtaining history from patient or surrogate, review of old charts, re-evaluation of patient's condition, pulse oximetry, ordering and review of radiographic studies, ordering and review of laboratory studies and ordering and performing treatments and interventions   ____________________________________________   INITIAL IMPRESSION / ASSESSMENT AND PLAN / ED COURSE      45 year old female with past medical history of with past medical history of dural venous sinus thrombosis with  hemorrhagic conversion who presents to the ED for weakness and seizure.  Patient reportedly initially developed weakness on the right side of her body and fell to the ground, was brought to the ED but had generalized tonic-clonic seizure just prior to triage.  Patient was immediately brought back to her room, was gradually waking up at that time and withdrew from pain in all extremities except for the right upper.  Given her initial right-sided weakness as well as this finding, code stroke was called.  CT head reviewed by me and shows no obvious hemorrhage, negative for acute process per radiology.  Patient evaluated by neurology, who recommended CTA of head and neck, which is negative for large vessel occlusion or other acute process.  Images reviewed with radiology, who states that they are able to visualize venous sinuses adequately and there is no evidence of recurrent venous sinus thrombosis.  Patient unfortunately did have a second generalized seizure just prior to CTA which stopped on its own after 30 seconds.  She was given a second gram of Keppra as well as 2 mg of Ativan and has had no further seizure activity.  She is now waking up and is able to communicate, vision is midline.  Given she is returning to baseline, transfer for continuous EEG monitoring does not seem indicated at this time.  Labs are unremarkable and case discussed with hospitalist for admission.      ____________________________________________   FINAL CLINICAL IMPRESSION(S) / ED DIAGNOSES  Final diagnoses:  Seizure ([place])  Right sided weakness     ED Discharge Orders     None        Note:  This document was prepared using Dragon voice recognition software and may include unintentional dictation errors.    Blake Divine, MD 04/02/21 [phone]

## 2021-04-02 NOTE — ED Notes (Signed)
This RN received phone call from [place], primary RN stating patient actively seizing in CT. This RN, EDP [place], and Liane Comber, RN to CT to assist. Pt noted to be post-ictal, primary RN Elana reports pt had tonic clonic seizure lasting approx 30 seconds, pt noted to be rolled on on her side by primary RN with assitance from [facility] staff. 2mg  IV ativan administered by this RN, dose verified with EDP and Liane Comber RN prior to to administration.

## 2021-04-02 NOTE — ED Notes (Signed)
Patient transported to CT 

## 2021-04-02 NOTE — ED Notes (Signed)
Dr. Barrington Ellison neuro assessment with assistance of Sunday Spillers, RN and translator.

## 2021-04-02 NOTE — ED Notes (Signed)
CODE STROKE CALLED TO CARELINK (KIM)

## 2021-04-02 NOTE — ED Notes (Signed)
ED Provider at bedside. 

## 2021-04-02 NOTE — Consult Note (Signed)
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 On teleneuro exam, patient is awake but altered, not answering questions appropriately or following most commands, and she has R gaze preference and R-sided weakness. CT head shows no acute abnormalities. Patient may still be having ongoing complex partial seizures or she may be postictal now. UPDATE: Patient was loaded with 2g Keppra and went back to CT for a CTA head/neck. While she was there she had another generalized tonic clonic seizure, this time lasting about 30 seconds. She was then given Ativan 2mg .  Metrics: Last Known Well: 04/02/2021 18:30:00 [facility] Notification Time: 04/02/2021 29:52:84 Arrival Time: 04/02/2021 19:34:00 Stamp Time: 04/02/2021 20:23:29 Initial Response Time: 04/02/2021 20:26:48 Symptoms: R-sided numbness/weakness, seizure. NIHSS Start Assessment Time: 04/02/2021 20:34:06 Patient is not a candidate for Thrombolytic. Thrombolytic Medical Decision: 04/02/2021 20:28:55 Patient was not deemed candidate for Thrombolytic because of following reasons: Current or Previous ICH. Other Diagnosis suspected.  CT head showed no acute hemorrhage or acute core infarct. CT head was reviewed and results were: 1. No acute intracranial abnormality. 2. ASPECTS is 10. 3. Unchanged area of posterior left hemisphere encephalomalacia.  ED Physician notified of diagnostic impression and management plan on 04/02/2021  21:17:12  Advanced Imaging: CTA Head and Neck Completed.  CTP Completed.  LVO:No  Patient doesn't meet criteria for emergent NIR consideration   Our recommendations are outlined below.  Recommendations:       - Seizure precautions       - Neuro checks       - Increase Keppra maintenance dose to 1000mg  BID       - Ativan 2mg  IV prn generalized tonic clonic seizure >3 mins or >3 seizures within an hour       - If patient has recurrent rightward gaze deviation, administer additional spot doses of Ativan (up to 6mg  total/day) and obtain STAT EEG to rule out nonconvulsive status epilepticus. Would also be reasonable to have patient transferred to a facility where continuous EEG is available.       - If breakthrough seizure activity despite Keppra and Ativan, can increase Keppra maintenance dose to 1500mg  BID and/or load with fosphenytoin 20mg /kg       - CTV head to rule out recurrent venous sinus thrombosis (or can request Radiology to interpret venous phase of CTA that was done earlier)       - Maintain euglycemia, normothermia  Routine Consultation with [facility] for Follow up Care  Sign Out:       Discussed with Emergency Department Provider    ------------------------------------------------------------------------------  History of Present Illness: Patient is a 45 year old Female.  Patient was brought by private transportation with symptoms of R-sided numbness/weakness, seizure.  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 The R-sided symptoms started around 1830 this evening. Her family drove her to the ED, and while she  was in triage she had a witnessed generalized tonic clonic seizure that lasted about 1 minute. Since then, the patient has been altered. Per chart review, patient is supposed to be Keppra 500mg   BID at home, but her family is not sure if she has been taking it. She was previously on coumadin for her venous sinus thrombosis but her doctor took her off of it 2 months ago. Family states she has not had a seizure since she was hospitalized last year.    Past Medical History:      Hyperlipidemia      Stroke      There is NO history of Hypertension      There is NO history of Diabetes Mellitus      There is NO history of Atrial Fibrillation      There is NO history of Coronary Artery Disease      There is NO history of Covid-19      CVST, seizures  Social History:Unable to obtain due to Patient Status  Family History:Unable to obtain due to Patient Status  Review of System:  14 Points Review of Systems was performed and was negative except mentioned in HPI.  Anticoagulant use:  No  Antiplatelet use: Unknown  Allergies:  Reviewed     Examination: BP(127/54), Pulse(96), Blood Glucose(140) 1A: Level of Consciousness - Arouses to minor stimulation + 1 1B: Ask Month and Age - Could Not Answer Either Question Correctly + 2 1C: Blink Eyes & Squeeze Hands - Performs 0 Tasks + 2 2: Test Horizontal Extraocular Movements - Partial Gaze Palsy: Can Be Overcome + 1 3: Test Visual Fields - No Visual Loss + 0 4: Test Facial Palsy (Use Grimace if Obtunded) - Normal symmetry + 0 5A: Test Left Arm Motor Drift - Drift, hits bed + 2 5B: Test Right Arm Motor Drift - No Effort Against Gravity + 3 6A: Test Left Leg Motor Drift - Drift, hits bed + 2 6B: Test Right Leg Motor Drift - No Effort Against Gravity + 3 7: Test Limb Ataxia (FNF/Heel-Shin) - No Ataxia + 0 8: Test Sensation - Normal; No sensory loss + 0 9: Test Language/Aphasia - Severe Aphasia: Fragmentary Expression, Inference Needed, Cannot Identify Materials + 2 10: Test Dysarthria - Mild-Moderate Dysarthria: Slurring but can be understood + 1 11: Test Extinction/Inattention - No abnormality + 0  NIHSS Score: 19   Pre-Morbid  Modified Rankin Scale: 1 Points = No significant disability despite symptoms; able to carry out all usual duties and activities   Patient/Family was informed the Neurology Consult would occur via TeleHealth consult by way of interactive audio and video telecommunications and consented to receiving care in this manner.   Patient is being evaluated for possible acute neurologic impairment and high probability of imminent or life-threatening deterioration. I spent total of 75 minutes providing care to this patient, including time for face to face visit via telemedicine, review of medical records, imaging studies and discussion of findings with providers, the patient and/or family.   Dr Damaris Hippo   [facility] [phone]  Case [id number]

## 2021-04-03 ENCOUNTER — Encounter: Payer: Self-pay | Admitting: Internal Medicine

## 2021-04-03 LAB — URINALYSIS, COMPLETE (UACMP) WITH MICROSCOPIC
Bacteria, UA: NONE SEEN
Bilirubin Urine: NEGATIVE
Glucose, UA: NEGATIVE mg/dL
Ketones, ur: NEGATIVE mg/dL
Leukocytes,Ua: NEGATIVE
Nitrite: NEGATIVE
Protein, ur: 30 mg/dL — AB
RBC / HPF: >50 RBC/hpf — ABNORMAL HIGH (ref 0–5)
Specific Gravity, Urine: >1.046 — ABNORMAL HIGH (ref 1.005–1.030)
Squamous Epithelial / HPF: NONE SEEN (ref 0–5)
pH: 5 (ref 5.0–8.0)

## 2021-04-03 LAB — TYPE AND SCREEN
ABO/RH(D): B POS
Antibody Screen: NEGATIVE

## 2021-04-03 LAB — URINE DRUG SCREEN, QUALITATIVE (ARMC ONLY)
Amphetamines, Ur Screen: NOT DETECTED
Barbiturates, Ur Screen: NOT DETECTED
Benzodiazepine, Ur Scrn: POSITIVE — AB
Cannabinoid 50 Ng, Ur ~~LOC~~: NOT DETECTED
Cocaine Metabolite,Ur ~~LOC~~: NOT DETECTED
MDMA (Ecstasy)Ur Screen: NOT DETECTED
Methadone Scn, Ur: NOT DETECTED
Opiate, Ur Screen: NOT DETECTED
Phencyclidine (PCP) Ur S: NOT DETECTED
Tricyclic, Ur Screen: NOT DETECTED

## 2021-04-03 LAB — HIV ANTIBODY (ROUTINE TESTING W REFLEX): HIV Screen 4th Generation wRfx: NONREACTIVE

## 2021-04-03 LAB — PHOSPHORUS: Phosphorus: 3.2 mg/dL (ref 2.5–4.6)

## 2021-04-03 LAB — MAGNESIUM: Magnesium: 2.3 mg/dL (ref 1.7–2.4)

## 2021-04-03 MED ORDER — LACTATED RINGERS IV SOLN: INTRAVENOUS | Status: DC

## 2021-04-03 MED ORDER — ONDANSETRON HCL 4 MG PO TABS: 4.0000 mg | ORAL_TABLET | Freq: Four times a day (QID) | ORAL | Status: DC | PRN

## 2021-04-03 MED ORDER — LEVETIRACETAM IN NACL 1000 MG/100ML IV SOLN
1000.0000 mg | Freq: Two times a day (BID) | INTRAVENOUS | Status: DC
  Administered 2021-04-03: 1000 mg via INTRAVENOUS
  Filled 2021-04-03: qty 100

## 2021-04-03 MED ORDER — LORAZEPAM 2 MG/ML IJ SOLN: 1.0000 mg | INTRAMUSCULAR | Status: DC | PRN

## 2021-04-03 MED ORDER — ONDANSETRON HCL 4 MG/2ML IJ SOLN: 4.0000 mg | Freq: Four times a day (QID) | INTRAMUSCULAR | Status: DC | PRN

## 2021-04-03 MED ORDER — ACETAMINOPHEN 325 MG RE SUPP: 650.0000 mg | RECTAL | Status: DC | PRN

## 2021-04-03 MED ORDER — LACTATED RINGERS IV BOLUS
500.0000 mL | Freq: Once | INTRAVENOUS | Status: AC
  Administered 2021-04-03: 500 mL via INTRAVENOUS

## 2021-04-03 MED ORDER — ACETAMINOPHEN 325 MG PO TABS: 650.0000 mg | ORAL_TABLET | ORAL | Status: DC | PRN

## 2021-04-03 MED ORDER — SODIUM CHLORIDE 0.9 % IV SOLN
75.0000 mL/h | INTRAVENOUS | Status: DC
  Administered 2021-04-03: 75 mL/h via INTRAVENOUS

## 2021-04-03 MED ORDER — ENOXAPARIN SODIUM 40 MG/0.4ML IJ SOSY: 40.0000 mg | PREFILLED_SYRINGE | INTRAMUSCULAR | Status: DC

## 2021-04-03 MED ORDER — LEVETIRACETAM 1000 MG PO TABS: 1000.0000 mg | ORAL_TABLET | Freq: Two times a day (BID) | ORAL | 0 refills | Status: DC

## 2021-04-03 NOTE — Consult Note (Addendum)
Neurology Consultation Reason for Consult: Seizures Referring Physician: Mal Misty, B  CC: Seizures  History is obtained from: Patient, sister-in-law  HPI: Erica Rosario is a 45 y.o. female with a history of previous venous sinus thrombosis and intracranial hemorrhage with seizures who is maintained on levetiracetam 500 mg twice daily.  She was in her normal state of health until yesterday when she started having visual problems similar to what she had with her previous stroke, and then subsequently was seen to have multiple seizures.  She denies having missed any doses of her medication.  She is currently back to baseline.  She was loaded with 2 g of Keppra and given Ativan 2 mg x 1.  No further seizures since that time.  She denies any current headache.  ROS: A 14 point ROS was performed and is negative except as noted in the HPI.  Past Medical History:  Diagnosis Date   Intracerebral hemorrhage ([place])      Family History  Problem Relation Age of Onset   Breast cancer Neg Hx      Social History:  reports that she has never smoked. She has never used smokeless tobacco. She reports that she does not currently use alcohol. She reports that she does not use drugs.   Exam: Current vital signs: BP 113/65   Pulse 83   Temp 98.4 F (36.9 C) (Oral)   Resp 17   SpO2 98%  Vital signs in last 24 hours: Temp:  [98.4 F (36.9 C)] 98.4 F (36.9 C) (10/15 0157) Pulse Rate:  [51-109] 83 (10/15 1130) Resp:  [13-37] 17 (10/15 1130) BP: (88-127)/(48-95) 113/65 (10/15 1130) SpO2:  [72 %-100 %] 98 % (10/15 1130)   Physical Exam  Constitutional: Appears well-developed and well-nourished.  Psych: Affect appropriate to situation Eyes: No scleral injection HENT: No OP obstruction MSK: no joint deformities.  Cardiovascular: Normal rate and regular rhythm.  Respiratory: Effort normal, non-labored breathing GI: Soft.  No distension. There is no tenderness.  Skin: WDI  Neuro: Mental  Status: Patient is awake, alert, oriented to person, place, month, year, and situation. Patient is able to give a clear and coherent history. No signs of aphasia or neglect Cranial Nerves: II: She has a mild right field cut. Pupils are equal, round, and reactive to light.   III,IV, VI: EOMI without ptosis or diploplia.  V: Facial sensation is symmetric to temperature VII: Facial movement is symmetric.  VIII: hearing is intact to voice X: Uvula elevates symmetrically XI: Shoulder shrug is symmetric. XII: tongue is midline without atrophy or fasciculations.  Motor: Tone is normal. Bulk is normal. 5/5 strength was present in all four extremities.  Sensory: Sensation is symmetric to light touch and temperature in the arms and legs. Deep Tendon Reflexes: 2+ and symmetric in the biceps and patellae.  Plantars: Toes are downgoing bilaterally.  Cerebellar: FNF and HKS are intact bilaterally      I have reviewed labs in epic and the results pertinent to this consultation are: Normal sodium, normal magnesium, normal calcium  I have reviewed the images obtained: CT/CTA/CTV-negative including patent venous sinuses.  Impression: 45 year old female with recurrent seizures in the setting of previous venous sinus thrombosis and intracranial hemorrhage.  With a return to baseline, I suspect this simply represents breakthrough seizure.  She has no evidence of recurrent venous sinus thrombosis on CTV.  Recommendations: 1) Keppra 1 g twice daily(increased from home 500 mg twice daily) 2) follow-up with outpatient neurology.   Addison Lank  Leonel Ramsay, MD [facility] [phone]  If 7pm- 7am, please page neurology on call as listed in [place].

## 2021-04-03 NOTE — Progress Notes (Signed)
Cross Cover Patient was given 500 ml LR bolus and maintenance fluids changed to LR at 125 secondary to soft pressures and low urine output.  Mag level added to blood drawn previously and awaiting results

## 2021-04-03 NOTE — ED Notes (Signed)
Pt given meal tray.

## 2021-04-03 NOTE — Discharge Summary (Signed)
Physician Discharge Summary  [name] MOQ:[id number] DOB: 08/25/1975 DOA: 04/02/2021  PCP: [facility] date: 04/02/2021 Discharge date: 04/03/2021  Discharge disposition: Home   Recommendations for Outpatient Follow-Up:   Follow-up with PCP in 1 week   Discharge Diagnosis:   Principal Problem:   Recurrent seizures ([place]) Active Problems:   Hypercoagulable state ([place])   History of cerebral venous sinus thrombosis   History of intracranial hemorrhage   History of seizure   Acute focal neurological deficit    Discharge Condition: Stable.  Diet recommendation:  Diet Order             Diet Heart Room service appropriate? Yes; Fluid consistency: Thin  Diet effective now           Diet - low sodium heart healthy                     Code Status: Full Code     Hospital Course:   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  She was previously on Coumadin but this was discontinued about 2 months prior to admission because of heavy menses.  She was brought to the hospital because of sudden onset of right-sided weakness and numbness feeling of flashing lights on the right side.  His symptoms were similar to symptoms that she experienced when she had a stroke in 2021.  In the emergency room criteria, patient had generalized tonic-clonic seizure.  She was brought to the main emergency room area and she was noted to have right-sided deficits so code stroke was called.  CT head was negative for acute stroke.  Patient developed another tonic-clonic seizure in the radiology department where she was scheduled to have CTA head and neck.  She was admitted to the hospital for breakthrough seizures.  She  was treated with IV Keppra.  She did not have any more seizures.  She felt better and back to her baseline.  She was evaluated by the neurologist who recommended increasing Keppra from 500 mg twice daily to 1 g twice daily.   Of note, she had pyuria but there was no bacteria in the urine and she was asymptomatic.  She was also afebrile and she did not have any leukocytosis.  There was no indication for treatment with antibiotics.  She is deemed stable for discharge to home today.  Discharge plan was discussed with the patient and her husband at the bedside.  She was admitted as an inpatient.  However, she did not spend more than 2 midnights in the hospital because her condition improved rather quickly and there was no need to keep in the hospital.    Medical Consultants:   Neurologist   Discharge Exam:    Vitals:   04/03/21 0900 04/03/21 1000 04/03/21 1100 04/03/21 1130  BP: (!) 106/56 (!) 98/48 104/60 113/65  Pulse: 70 66 73 83  Resp: (!) 21 17 20 17   Temp:      TempSrc:      SpO2: 100% 97% 96% 98%     GEN: NAD SKIN: Warm and dry EYES: No pallor or icterus ENT: MMM CV: RRR PULM: CTA B ABD: soft, ND, NT, +BS CNS: AAO x 3, non focal EXT: No edema or tenderness   The results of  significant diagnostics from this hospitalization (including imaging, microbiology, ancillary and laboratory) are listed below for reference.     Procedures and Diagnostic Studies:   DG Chest Portable 1 View  Result Date: 04/02/2021 CLINICAL DATA:  Altered mental status. EXAM: PORTABLE CHEST 1 VIEW COMPARISON:  April 07, 2020 FINDINGS: The right-sided PICC line seen on the prior study has been removed. Low lung volumes are seen with subsequent crowding of the bronchovascular lung markings. There is no evidence of acute infiltrate, pleural effusion or pneumothorax. The heart size and mediastinal contours are within normal limits. The visualized skeletal structures are unremarkable. IMPRESSION: No  active disease. Electronically Signed   By: Virgina Norfolk M.D.   On: 04/02/2021 21:45   CT HEAD CODE STROKE WO CONTRAST  Result Date: 04/02/2021 CLINICAL DATA:  Code stroke.  Right-sided weakness EXAM: CT HEAD WITHOUT CONTRAST TECHNIQUE: Contiguous axial images were obtained from the base of the skull through the vertex without intravenous contrast. COMPARISON:  None. FINDINGS: Brain: There is no mass, hemorrhage or extra-axial collection. The size and configuration of the ventricles and extra-axial CSF spaces are normal. Unchanged area of posterior left hemisphere encephalomalacia. Vascular: No abnormal hyperdensity of the major intracranial arteries or dural venous sinuses. No intracranial atherosclerosis. Skull: The visualized skull base, calvarium and extracranial soft tissues are normal. Sinuses/Orbits: No fluid levels or advanced mucosal thickening of the visualized paranasal sinuses. No mastoid or middle ear effusion. The orbits are normal. ASPECTS [facility] Stroke Program Early CT Score) - Ganglionic level infarction (caudate, lentiform nuclei, internal capsule, insula, M1-M3 cortex): 7 - Supraganglionic infarction (M4-M6 cortex): 3 Total score (0-10 with 10 being normal): 10 IMPRESSION: 1. No acute intracranial abnormality. 2. ASPECTS is 10. 3. Unchanged area of posterior left hemisphere encephalomalacia. These results were called by telephone at the time of interpretation on 04/02/2021 at 8:35 pm to provider [facility] , who verbally acknowledged these results. Electronically Signed   By: Ulyses Jarred M.D.   On: 04/02/2021 20:37   CT ANGIO HEAD NECK W WO CM W PERF (CODE STROKE)  Result Date: 04/02/2021 EXAM: CT ANGIOGRAPHY HEAD AND NECK CT PERFUSION BRAIN TECHNIQUE: Multidetector CT imaging of the head and neck was performed using the standard protocol during bolus administration of intravenous contrast. Multiplanar CT image reconstructions and MIPs were obtained to evaluate the vascular  anatomy. Carotid stenosis measurements (when applicable) are obtained utilizing NASCET criteria, using the distal internal carotid diameter as the denominator. Multiphase CT imaging of the brain was performed following IV bolus contrast injection. Subsequent parametric perfusion maps were calculated using RAPID software. CONTRAST:  177mL OMNIPAQUE IOHEXOL 350 MG/ML SOLN COMPARISON:  None. FINDINGS: CTA NECK FINDINGS SKELETON: There is no bony spinal canal stenosis. No lytic or blastic lesion. OTHER NECK: Normal pharynx, larynx and major salivary glands. No cervical lymphadenopathy. Unremarkable thyroid gland. UPPER CHEST: No pneumothorax or pleural effusion. No nodules or masses. AORTIC ARCH: There is no calcific atherosclerosis of the aortic arch. There is no aneurysm, dissection or hemodynamically significant stenosis of the visualized portion of the aorta. Conventional 3 vessel aortic branching pattern. The visualized proximal subclavian arteries are widely patent. RIGHT CAROTID SYSTEM: Normal without aneurysm, dissection or stenosis. LEFT CAROTID SYSTEM: Normal without aneurysm, dissection or stenosis. VERTEBRAL ARTERIES: Left dominant configuration. Both origins are clearly patent. There is no dissection, occlusion or flow-limiting stenosis to the skull base (V1-V3 segments). CTA HEAD FINDINGS POSTERIOR CIRCULATION: --Vertebral arteries: Normal V4 segments. --Inferior cerebellar arteries: Normal. --Basilar artery: Normal. --Superior cerebellar  arteries: Normal. --Posterior cerebral arteries (PCA): Normal. ANTERIOR CIRCULATION: --Intracranial internal carotid arteries: Normal. --Anterior cerebral arteries (ACA): Normal. Both A1 segments are present. Patent anterior communicating artery (a-comm). --Middle cerebral arteries (MCA): Normal. VENOUS SINUSES: Patent ANATOMIC VARIANTS: None Review of the MIP images confirms the above findings. CT Brain Perfusion Findings: ASPECTS: 10 CBF (<30%) Volume: 45mL Perfusion  (Tmax>6.0s) volume: 41mL Mismatch Volume: 92mL Infarction Location:None IMPRESSION: 1. Normal perfusion scan of the brain. 2. Normal CTA of the head and neck. Electronically Signed   By: Ulyses Jarred M.D.   On: 04/02/2021 21:35     Labs:   Basic Metabolic Panel: Recent Labs  Lab 04/02/21 2018 04/02/21 2238  NA 145  --   K 3.7  --   CL 106  --   CO2 19*  --   GLUCOSE 144*  --   BUN 16  --   CREATININE 0.87  --   CALCIUM 10.0  --   MG  --  2.3  PHOS  --  3.2   GFR CrCl cannot be calculated (Unknown ideal weight.). Liver Function Tests: Recent Labs  Lab 04/02/21 2018  AST 36  ALT 39  ALKPHOS 113  BILITOT 0.4  PROT 8.3*  ALBUMIN 4.6   No results for input(s): LIPASE, AMYLASE in the last 168 hours. No results for input(s): AMMONIA in the last 168 hours. Coagulation profile Recent Labs  Lab 04/02/21 2018  INR 1.0    CBC: Recent Labs  Lab 04/02/21 2018  WBC 10.2  NEUTROABS 3.9  HGB 13.8  HCT 42.9  MCV 100.2*  PLT 246   Cardiac Enzymes: No results for input(s): CKTOTAL, CKMB, CKMBINDEX, TROPONINI in the last 168 hours. BNP: Invalid input(s): POCBNP CBG: Recent Labs  Lab 04/02/21 2010  GLUCAP 140*   D-Dimer No results for input(s): DDIMER in the last 72 hours. Hgb A1c No results for input(s): HGBA1C in the last 72 hours. Lipid Profile No results for input(s): CHOL, HDL, LDLCALC, TRIG, CHOLHDL, LDLDIRECT in the last 72 hours. Thyroid function studies No results for input(s): TSH, T4TOTAL, T3FREE, THYROIDAB in the last 72 hours.  Invalid input(s): FREET3 Anemia work up No results for input(s): VITAMINB12, FOLATE, FERRITIN, TIBC, IRON, RETICCTPCT in the last 72 hours. Microbiology Recent Results (from the past 240 hour(s))  Resp Panel by RT-PCR (Flu A&B, Covid) Nasopharyngeal Swab     Status: None   Collection Time: 04/02/21 10:30 PM   Specimen: Nasopharyngeal Swab; Nasopharyngeal(NP) swabs in vial transport medium  Result Value Ref Range Status    SARS Coronavirus 2 by RT PCR NEGATIVE NEGATIVE Final    Comment: (NOTE) SARS-CoV-2 target nucleic acids are NOT DETECTED.  The SARS-CoV-2 RNA is generally detectable in upper respiratory specimens during the acute phase of infection. The lowest concentration of SARS-CoV-2 viral copies this assay can detect is 138 copies/mL. A negative result does not preclude SARS-Cov-2 infection and should not be used as the sole basis for treatment or other patient management decisions. A negative result may occur with  improper specimen collection/handling, submission of specimen other than nasopharyngeal swab, presence of viral mutation(s) within the areas targeted by this assay, and inadequate number of viral copies(<138 copies/mL). A negative result must be combined with clinical observations, patient history, and epidemiological information. The expected result is Negative.  Fact Sheet for Patients:  EntrepreneurPulse.com.au  Fact Sheet for Healthcare Providers:  IncredibleEmployment.be  This test is no t yet approved or cleared by the [place] and  has been authorized for detection and/or diagnosis of SARS-CoV-2 by FDA under an Emergency Use Authorization (EUA). This EUA will remain  in effect (meaning this test can be used) for the duration of the COVID-19 declaration under Section 564(b)(1) of the Act, 21 U.S.C.section 360bbb-3(b)(1), unless the authorization is terminated  or revoked sooner.       Influenza A by PCR NEGATIVE NEGATIVE Final   Influenza B by PCR NEGATIVE NEGATIVE Final    Comment: (NOTE) The Xpert Xpress SARS-CoV-2/FLU/RSV plus assay is intended as an aid in the diagnosis of influenza from Nasopharyngeal swab specimens and should not be used as a sole basis for treatment. Nasal washings and aspirates are unacceptable for Xpert Xpress SARS-CoV-2/FLU/RSV testing.  Fact Sheet for  Patients: EntrepreneurPulse.com.au  Fact Sheet for Healthcare Providers: IncredibleEmployment.be  This test is not yet approved or cleared by the [place] FDA and has been authorized for detection and/or diagnosis of SARS-CoV-2 by FDA under an Emergency Use Authorization (EUA). This EUA will remain in effect (meaning this test can be used) for the duration of the COVID-19 declaration under Section 564(b)(1) of the Act, 21 U.S.C. section 360bbb-3(b)(1), unless the authorization is terminated or revoked.  Performed at [facility], [address] [place], [place] [address]      Discharge Instructions:   Discharge Instructions     Diet - low sodium heart healthy   Complete by: As directed    Discharge instructions   Complete by: As directed    Avoid unsupervised activities that might pose danger with sudden loss of consciousness, including bathing, swimming alone, working at heights, driving and operating heavy machinery.   Driving Restrictions   Complete by: As directed    Do not drive any motor vehicle   Increase activity slowly   Complete by: As directed       Allergies as of 04/03/2021   No Known Allergies      Medication List     TAKE these medications    aspirin EC 81 MG tablet Take 1 tablet (81 mg total) by mouth daily. Swallow whole.   atorvastatin 40 MG tablet Commonly known as: LIPITOR TAKE 1 TABLET (40 MG TOTAL) BY MOUTH DAILY. What changed: how much to take   Iron 325 (65 Fe) MG Tabs Take 325 mg by mouth daily.   levETIRAcetam 1000 MG tablet Commonly known as: KEPPRA Take 1 tablet (1,000 mg total) by mouth 2 (two) times daily. What changed:  medication strength how much to take   meclizine 25 MG tablet Commonly known as: ANTIVERT Take 1 tablet (25 mg total) by mouth 3 (three) times daily as needed for dizziness.   pantoprazole 40 MG tablet Commonly known as: PROTONIX Take 1 tablet (40 mg  total) by mouth daily.   Topamax 25 MG tablet Generic drug: topiramate Take 25 mg by mouth 2 (two) times daily.   Ubrelvy 100 MG Tabs Generic drug: Ubrogepant Take 100 mg by mouth daily as needed (during menses for migraine prevention).           If you experience worsening of your admission symptoms, develop shortness of breath, life threatening emergency, suicidal or homicidal thoughts you must seek medical attention immediately by calling 911 or calling your MD immediately  if symptoms less severe.   You must read complete instructions/literature along with all the possible adverse reactions/side effects for all the medicines you take and that have been prescribed to you. Take any new medicines after you  have completely understood and accept all the possible adverse reactions/side effects.    Please note   You were cared for by a hospitalist during your hospital stay. If you have any questions about your discharge medications or the care you received while you were in the hospital after you are discharged, you can call the unit and asked to speak with the hospitalist on call if the hospitalist that took care of you is not available. Once you are discharged, your primary care physician will handle any further medical issues. Please note that NO REFILLS for any discharge medications will be authorized once you are discharged, as it is imperative that you return to your primary care physician (or establish a relationship with a primary care physician if you do not have one) for your aftercare needs so that they can reassess your need for medications and monitor your lab values.       Time coordinating discharge: 33 minutes  Signed:  Shandell Giovanni  [facility] 04/03/2021, 12:39 PM   Pager on [email or web address]. If 7PM-7AM, please contact night-coverage at [email or web address]

## 2021-04-07 ENCOUNTER — Telehealth: Payer: Self-pay | Admitting: Neurology

## 2021-04-07 ENCOUNTER — Encounter: Payer: Self-pay | Admitting: Neurology

## 2021-04-07 ENCOUNTER — Ambulatory Visit (INDEPENDENT_AMBULATORY_CARE_PROVIDER_SITE_OTHER): Payer: 59 | Admitting: Neurology

## 2021-04-07 VITALS — BP 115/79 | HR 77 | Ht 60.0 in | Wt 137.0 lb

## 2021-04-07 DIAGNOSIS — G40909 Epilepsy, unspecified, not intractable, without status epilepticus: Secondary | ICD-10-CM | POA: Diagnosis not present

## 2021-04-07 MED ORDER — LEVETIRACETAM 1000 MG PO TABS: 1000.0000 mg | ORAL_TABLET | Freq: Two times a day (BID) | ORAL | 0 refills | Status: DC

## 2021-04-07 MED ORDER — LEVETIRACETAM 1000 MG PO TABS: ORAL_TABLET | ORAL | 2 refills | Status: DC

## 2021-04-07 NOTE — Telephone Encounter (Signed)
Mark with [facility]  called asking about the instructions for the levETIRAcetam (KEPPRA) 1000 MG tablet. Elta [place] is requesting a call back.

## 2021-04-07 NOTE — Progress Notes (Signed)
[facility] [address] [place]. [place] [address] [phone]       STROKE FOLLOW UP NOTE  [name] Date of Birth:  December 10, 1975 Medical Record Number:  [id number]   Reason for Referral: stroke follow up    SUBJECTIVE:   CHIEF COMPLAINT:  Chief Complaint  Patient presents with   New Patient (Initial Visit)    Rm 13, with interpreter, c/o dizziness and nausea x 3 days      HPI:   Today, 01/18/2021, Erica Rosario returns for 52-month stroke follow-up accompanied by sister and [place] interpreter.  She was evaluated at [facility] ED 7/29 for dizziness and occipital and frontal headache associated with bright lights in right eye over the past week.  Seen in ED for similar symptoms on 3/7 and diagnosed with iron deficiency anemia possibly in setting of heavy menstrual cycle.  Started menstrual cycle on 7/23 but symptoms much worse compared to prior symptoms. Dizziness improved after meclizine and received IM Reglan for headache.  CT head unremarkable for acute findings.  Lab work unremarkable.  Symptoms have since resolved.  Is scheduled for IUD implant on 8/8 due to continued heavy irregular menstrual cycles.  Of note, she did have IUD placed 7/12 to help control menstrual cycles with heavy bleeding.  She describes the dizziness as a lightheaded feeling.  She has had vertigo in the past and reports different dizziness symptoms associated with menstrual cycle.  Residual stroke deficit right peripheral visual impairment stable.  Denies any other new stroke/TIA symptoms. Compliant on warfarin with reported heavy menstrual cycle since starting and atorvastatin without associated side effects. Blood pressure today 110/66. Reports complaince on keppra 500mg  BID without associated side effects and denies any seizure activity.  No further concerns at this time.   History provided for reference purposes only Update 09/09/2020 JM: Mrs. Rosario returns for stroke  follow-up after prior visit approximately 4 months ago accompanied by interpreter.   Residual deficits of mild right peripheral visual impairment but is improving.  She denies residual right hand numbness Evaluated in the ED 08/24/2020 with headache, dizziness and visual complaints. MRI and MRV unremarkable for acute findings.  Noted to be anemic with low iron levels which was felt possibly related to heavy menstrual cycles. Headache and dizziness have since resolved. She has since f/u with OB/GYN with plans on placing IUD to help control menstrual cycles.  Also follow-up with PCP who initiated ferrous sulfate. Has f/u next week for repeat lab work Denies new stroke/TIA symptoms  reports bilateral hand finger joint pain with increased stiffness and quick lasting "tremor" with extension of the fingers upon awakening which has been present over the past month.  Denies any weakness, pain in other locations such as wrist or arm or numbness/tingling  Continues on Keppra 500 mg twice daily -tolerating denies any recent seizure activity  Remains on warfarin with INR levels monitored by PCP- per patient, INR level stable - unable to view via epic - aside from heavy menstrual cycles, no other bleeding or bruising concerns Remains on atorvastatin -denies associated side effects  Blood pressure today 110/67 - does not routinely monitor at home  No further concerns at this time  Initial visit 05/12/2020 JM: [name] is being seen for hospital follow-up accompanied by her brother and interpreter. She reports she has been improving but continues to have right hand numbness, right peripheral visual impairment and gait impairment due to visual difficulty. She has not done any therapies  since discharge due to lack of insurance.  She is in the process of applying for [facility] financial assistance as well as other insurance..  Denies new or worsening stroke/TIA symptoms. 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  She lives with her husband and 2 sons able to maintain ADLs independently and has been slowly returning to completing IADLs.  She continues on Keppra 500 mg twice daily tolerating well and denies any reoccurring seizure activity.  She remains on warfarin without bleeding or bruising with INR levels monitored by PCP weekly. Per patient, INR levels have been greater than 3 so currently doing dosage adjustments per PCP. Remains on atorvastatin without myalgias. Blood pressure today 114/68 - not routinely monitored at home.  She has since discontinued OCP.  No further concerns at this time.  Stroke admission 03/25/2020 JM: Erica Rosario is a 45 y.o. female with no PMH  who presented to [facility] with HA and neck stiffness on 03/25/2020.   Personally reviewed hospitalization pertinent progress notes, lab work and imaging with summary provided.  Shortly transferred to [facility] and evaluated by Dr. 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  Repeat CT head showed hemorrhagic transformation L parietal with slight increase edema with most hemorrhagic foci unchanged and single new hemorrhage at vertex.  Repeat CT head 3 hours later stable. Pan CT negative for malignancy but did show B renal vein nonocclusive thrombosis. CTV and CTA h/n negative LVO with partial thrombosis of the superior sagittal sinus at the vertex, nonocclusive as well can be demonstrated around the margins of the thrombus with patency beyond that.  Also showed thrombosed superficial draining vein of the left again visible, similar in appearance with low density edema slightly increased and mild swelling with mass-effect and 1 to 2 mm of left-to-right shift and flattening of the left lateral ventricle.  Questionable hypercoagulable state with  hypercoagulable showed anticardiolipin IgM 19 (indeterminate) and protein S total and active mildly low but likely inaccurate on heparin.  Advise to discontinue OCP use.  Placed on heparin IV and warfarin daily. GTC in ER s/p Ativan and Keppra load and initiated Keppra 500 mg twice daily with LTM showing left frontal temporal continuous slowing and bilateral parieto-occipital spikes.  LDL UTC with direct LDL of 151 no statin PTA and initiated atorvastatin 40 mg daily.  No prior history of HTN or DM.  Other stroke risk factors include history of EtOH use and overweight but no prior stroke history.  Other active problems include anemia, leukocytosis and hypokalemia.  Evaluated by therapies and recommended discharge to CIR for ongoing therapy needs.  She was discharged to CIR on 03/31/2020 and discharged home on 04/03/2020 on Keppra, Topamax and warfarin.    She unfortunately returned on 04/04/2020 with complaint of headache, generalized weakness and "faintness" as well as nauseated and several episodes of emesis but no focal deficits noted.  She was found to have worsening vasogenic edema in left parieto-occipital region and worsening midline shift of 13 mm.  Warfarin discontinued and reversed with vitamin K.  Administering mannitolx1, Decadron, hypertonic saline and heparin drip.  Repeat CT head showed improved MLS, cerebral edema and decreased hydrocephalus and again repeat CT head showed stable edema with decreasing IPH and same 1 cm midline shift. NSG on board PRN.  Also found to have  severe anemia receiving 2U PRBC with improvement potentially contributing to syncopal event on 10/15 at home.  Elevated liver enzymes of unclear etiology possibly related to Topamax therefore DC'd.  Restarted warfarin with INR goal 2-3 in addition to Lovenox for total of 4 doses.  Advised to follow-up with [facility] on 10/25 for pro time blood test and CBC as well as INR levels and adjustment of warfarin  as indicated and possible discontinuation of Lovenox.  Discharged on dexamethasone.  Reevaluated by therapy recommended outpatient OT and no PT needs.  She was discharged home on 04/11/2020.   Update 04/07/2021 : Patient returns for follow-up after last visit 2 months ago.  She is accompanied by brother and Spanish language interpreter was present throughout this visit.  Patient was seen in the ER on 04/03/2021 with breakthrough seizures.  She presented with visual problems which was similar to what she had during her previous episode of venous sinus thrombosis and subsequently went on to have multiple seizures.  Patient denied missing any recent doses of medicines.  She was loaded with 2 g of Keppra and Ativan 2 mg.  Her maintenance dose of Keppra was increased to 1 g twice daily.  Patient states she does not remember the episode of the hospital visit.  She states she is done well since then and has not had any further recurrent seizures but she complains of feeling dizzy and having nausea and trouble tolerating the higher dose.  She did also have CT scan of the head as well as CT angiogram of the neck and brain both of which were unremarkable on 04/03/2021.  Patient denies significant headaches or numbness but she is on Topamax 25 mg twice daily and is wondering if she can stop it.  She has no new complaints today.  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     ROS:   14 system review of systems performed and negative with exception of those listed in HPI  PMH:  Past Medical History:  Diagnosis Date   Intracerebral hemorrhage (HCC)     PSH: No past surgical history on file.  Social History:  Social History   Socioeconomic History   Marital status: Married    Spouse name: Not on file   Number of children: Not on file   Years of education: Not on file   Highest education level: Not on  file  Occupational History   Not on file  Tobacco Use   Smoking status: Never   Smokeless tobacco: Never  Substance and Sexual Activity   Alcohol use: Not Currently   Drug use: Never   Sexual activity: Not on file  Other Topics Concern   Not on file  Social History Narrative   Not on file   Social Determinants of Health   Financial Resource Strain: Not on file  Food Insecurity: Not on file  Transportation Needs: Not on file  Physical Activity: Not on file  Stress: Not on file  Social Connections: Not on file  Intimate Partner Violence: Not on file    Family History:  Family History  Problem Relation Age of Onset   Breast cancer Neg Hx     Medications:   Current Outpatient Medications on File Prior to Visit  Medication Sig Dispense Refill   aspirin EC 81 MG tablet Take 1 tablet (81 mg  total) by mouth daily. Swallow whole. 30 tablet 11   Ferrous Sulfate (IRON) 325 (65 Fe) MG TABS Take 325 mg by mouth daily.     pantoprazole (PROTONIX) 40 MG tablet Take 1 tablet (40 mg total) by mouth daily. 30 tablet 0   atorvastatin (LIPITOR) 40 MG tablet TAKE 1 TABLET (40 MG TOTAL) BY MOUTH DAILY. (Patient taking differently: Take 40 mg by mouth daily.) 30 tablet 0   No current facility-administered medications on file prior to visit.    Allergies:  No Known Allergies    OBJECTIVE:  Physical Exam  Vitals:   04/07/21 0906  BP: 115/79  Pulse: 77  Weight: 137 lb (62.1 kg)  Height: 5' (1.524 m)    Body mass index is 26.76 kg/m. No results found.  General: well developed, well nourished,pleasant middle-aged Hispanic female, seated, in no evident distress Head: head normocephalic and atraumatic.   Neck: supple with no carotid or supraclavicular bruits Cardiovascular: regular rate and rhythm, no murmurs Musculoskeletal: no deformity Skin:  no rash/petichiae Vascular:  Normal pulses all extremities   Neurologic Exam Mental Status: Awake and fully alert. Limited English  primarily Spanish-speaking. Denies dysarthria or aphasia.  Oriented to place and time. Recent and remote memory intact. Attention span, concentration and fund of knowledge appropriate. Mood and affect appropriate.  Cranial Nerves: Pupils equal, briskly reactive to light. Extraocular movements full without nystagmus. Visual fields right homonymous inferior quadrantanopia. Hearing intact. Facial sensation intact. Face, tongue, palate moves normally and symmetrically.  Motor: Normal bulk and tone. Normal strength in all tested extremity muscles. Sensory.: intact to touch , pinprick , position and vibratory sensation.  Coordination: Rapid alternating movements normal in all extremities. Finger-to-nose and heel-to-shin performed accurately bilaterally.  No evidence of action or rest tremors or abnormal hand movements Gait and Station: Arises from chair without difficulty. Stance is normal. Gait demonstrates normal stride length and balance without use of assistive device.  Able to perform tandem walk and heel toe without difficulty.   Reflexes: 1+ and symmetric. Toes downgoing.       ASSESSMENT: SHEETAL LYALL is a 45 y.o. 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  Questionable hypercoagulable state with evidence of nonocclusive thrombosis of bilateral renal vein and anticardiolipin IgM 19 (indeterminate) and OCP use. Also started on Keppra for GTC and EEG showing bilateral parieto-occipital spikes.  She returned on 04/04/2020 (d/c'd home from [facility] on 10/15 in stable condition) with worsening headache, N/V and generalized weakness found to have worsening vasogenic edema in left parieto-occipital region and worsening midline shift of 13 mm requiring reversal of warfarin, mannitol x1, Decadron and hypertonic saline with  improvement.  Hospital course complicated by severe anemia, leukocytosis and fever, and elevated liver enzymes (? R/t Topamax).  Vascular risk factors include anemia, HLD and OCP use.     PLAN: I had a long discussion with the patient and her brother using Spanish language interpreter about her breakthrough seizure and stressed the need for medication compliance and avoiding seizure provoking stimuli like sleep deprivation, medication noncompliance, irregular eating and sleeping habits and alcohol.  She is having some side effects on the increased dose hence I recommend reducing the dose of Keppra to 500 mg in the morning and 1 gm at night.  Agree with discontinuing Topamax as she is not having significant headaches at the present  time.  Continue aspirin for stroke prevention.  She was advised not to drive for 6 months as per [facility].  She will will have routine EEG checked as well as anticardiolipin antibodies which were mildly abnormal in the past.  She will return for follow-up in 3 months with nurse practitioner Janett Billow or call earlier if necessary.  Antony Contras, MD  [facility] [address] [place], [place] [address]  Phone [phone] Fax [phone] Note: This document was prepared with digital dictation and possible smart phrase technology. Any transcriptional errors that result from this process are unintentional.

## 2021-04-07 NOTE — Patient Instructions (Signed)
I had a long discussion with the patient and her brother using Spanish language interpreter about her breakthrough seizure and stressed the need for medication compliance and avoiding seizure provoking stimuli like sleep deprivation, medication noncompliance, irregular eating and sleeping habits and alcohol.  She is having some side effects on the increased dose hence I recommend reducing the dose of Keppra to 500 mg in the morning and 1 gm at night.  Continue aspirin for stroke prevention.  She was advised not to drive for 6 months as per [facility].  She will will have routine EEG checked as well as anticardiolipin antibodies which were mildly abnormal in the past.  She will return for follow-up in 3 months with nurse practitioner Janett Billow or call earlier if necessary. Convulsiones en los adultos Seizure, Adult Ardelia Mems convulsin es una explosin repentina de [place] elctrica y qumica anormal en el cerebro. Las convulsiones generalmente duran entre 30 segundos y 2 minutos.  Cules son las causas? Las causas ms frecuentes de esta afeccin incluyen las siguientes: Fiebre o infeccin. Problemas que afectan al cerebro. Pueden incluir: Una lesin en la cabeza o el cerebro. Hemorragia cerebral. Un tumor cerebral. Niveles bajos de azcar o sal en la sangre. Problemas en los riones o el hgado. Enfermedades que se transmiten de padres a hijos (son hereditarias). Problemas con Ardelia Mems sustancia, por ejemplo: Lucilla Edin reaccin a una droga o un medicamento. Interrumpir repentinamente el uso de una sustancia (abstinencia). Accidente cerebrovascular. Trastornos que afectan a su desarrollo. En ocasiones, la causa puede ser desconocida.  Qu incrementa el riesgo? Tener un familiar con epilepsia. Con esta afeccin, las convulsiones se producen [place] y [place] vez con Physiological scientist. No tienen una causa clara. Haber tenido antes una crisis tnico-clnica. Este tipo de crisis provoca que: Contraiga los msculos  de todo el cuerpo. Pierda la conciencia. Haber tenido antes una lesin en la cabeza o un accidente cerebrovascular. Haber tenido falta de oxgeno al nacer. Cules son los signos o sntomas? Hay muchos tipos de convulsiones. Los sntomas varan segn el tipo de convulsin que tenga. Sntomas durante una convulsin Sacudones que no puede controlar (convulsiones) con movimientos espasmdicos rpidos de los msculos. Rigidez del cuerpo. Problemas respiratorios. Sentirse confundido (confuso). Jimmye Norman mirando a un punto fijo o no responder a los sonidos o al tacto. Movimientos de asentimiento con la cabeza. Parpadeo, aleteo o movimiento rpido de los ojos. Babear, gruir o hacer chasquidos con la boca. Perder el control de cundo se orina o se defeca. Sntomas antes de una convulsin Sentirse [place], nervioso o preocupado. Ganas de vomitar. Sentir lo siguiente: Que se est moviendo cuando no lo est. Que las cosas que estn alrededor se mueven cuando no lo hacen. Sensacin de que ya vio u oy algo antes (dj vu). Percepcin de sabores u olores extraos. Cambios en la forma de ver. Quizs vea manchas o luces intermitentes. Sntomas despus de una convulsin Sentirse confundido. Sensacin de somnolencia. Dolor de [place]. Dolores musculares. Cmo se trata? Si su convulsin se detiene por s sola, no necesitar tratamiento. Si su convulsin dura ms de 5 minutos, generalmente necesitar tratamiento. El tratamiento puede incluir: Medicamentos a travs de un tubo (catter) intravenoso. Evitar las cosas que se sabe que causan las convulsiones, tal como algunos medicamentos. Medicamentos para evitar convulsiones. Un dispositivo mdico para prevenir o controlar las convulsiones. Ciruga. Una dieta con bajo contenido de carbohidratos y alto contenido de grasas (dieta cetgena). Siga estas instrucciones en su casa: Medicamentos Use los medicamentos de venta  libre y los Teacher, English as a foreign language se lo haya indicado el mdico. Evite los alimentos y las bebidas que podran alterar el funcionamiento de los medicamentos, como el alcohol. [place] indicaciones acerca de Forensic psychologist, Social worker o hacer cosas que seran peligrosas si tuviese otra convulsin. Espere hasta que el mdico le diga que es seguro que haga esas [place]. Si vive en los [place], consulte al departamento de vehculos motorizados local cundo puede volver a Forensic psychologist. Descanse lo suficiente. Ensearles a otros  Ensee a sus amigos y familiares lo que deben hacer si usted tiene una convulsin. Ellos deben hacer lo siguiente: Ayudarlo a bajar al suelo. Protegerle la cabeza y el cuerpo. Aflojarle la ropa apretada alrededor del cuello. Recostarlo sobre un lado. Saber si usted necesita atencin de emergencia o no. Permanecer con usted hasta que se sienta mejor. Adems, dgales qu no deben hacer si tiene una convulsin. Decirles que: No deben sujetarlo. No deben introducirle nada en la boca. Instrucciones generales Evite todo lo que le cause convulsiones. Lleve un diario de sus convulsiones. Escriba los siguientes datos: Lo que recuerda sobre cada convulsin. Lo que usted cree que caus cada convulsin. Cumpla con todas las visitas de seguimiento. Comunquese con un mdico si: Tiene una o ms crisis epilpticas nuevas. Llame al mdico cada vez que tenga una convulsin. El patrn de sus convulsiones cambia. Contina teniendo convulsiones con [employer]. Tiene sntomas de estar enfermo o de tener una infeccin. No puede tomar sus medicamentos. Solicite ayuda de inmediato si: Tiene alguno de estos problemas: Una convulsin que dura ms de 5 minutos. Son [employer] las convulsiones seguidas y no se siente mejor entre una y [place]. Una convulsin que le dificulta la respiracin. Una convulsin y despus ya no puede hablar o usar una parte del cuerpo. No se despierta inmediatamente despus de  una convulsin. Se lesiona durante una convulsin. Siente confusin o dolor justo inmediatamente despus de una convulsin. Estos sntomas pueden Sales executive. Solicite ayuda de inmediato. Comunquese con el servicio de emergencias de su localidad (911 en los [place]). No espere a ver si los sntomas desaparecen. No conduzca por sus propios medios [employer]. Resumen Una convulsin es una explosin repentina de [place] elctrica y qumica anormal en el cerebro. Habitualmente duran entre 30 segundos y 2 minutos. Las causas de las convulsiones incluyen enfermedades, lesiones en la cabeza, niveles bajos de azcar o sal en sangre y ciertas afecciones. La mayora de las convulsiones se detienen solas en menos de 5 minutos. Las convulsiones que duran ms de 5 minutos constituyen una emergencia mdica y necesitan tratamiento inmediato. Existen muchos medicamentos que se usan para tratar las convulsiones. Use los medicamentos de venta libre y los recetados solamente como se lo haya indicado el mdico. Esta informacin no tiene Marine scientist el consejo del mdico. Asegrese de hacerle al mdico cualquier pregunta que tenga. Document Revised: 01/17/2020 Document Reviewed: 01/17/2020 Elsevier Patient Education  [place].

## 2021-04-07 NOTE — Telephone Encounter (Signed)
From today's note: I recommend reducing the dose of Keppra to 500 mg in the morning and 1 gm at night.  ______________________________________   I returned the call to [facility] at the pharmacy and provided this clarification of generic Keppra instructions. New rx sent to pharmacy.

## 2021-04-09 LAB — CARDIOLIPIN ANTIBODIES, IGG, IGM, IGA
Anticardiolipin IgA: <9 APL U/mL (ref 0–11)
Anticardiolipin IgG: <9 GPL U/mL (ref 0–14)
Anticardiolipin IgM: 14 MPL U/mL — ABNORMAL HIGH (ref 0–12)

## 2021-04-12 ENCOUNTER — Telehealth: Payer: Self-pay

## 2021-04-12 NOTE — Telephone Encounter (Signed)
-----   Message from Garvin Fila, MD sent at 04/10/2021  1:01 PM EDT ----- Mitchell Heir inform the patient that repeat lab work for anticardiolipin antibodies shows improvement.  Nothing to worry about ----- Message ----- From: Interface, [facility] Lab Results In Sent: 04/09/2021   7:37 AM EDT To: Garvin Fila, MD

## 2021-04-12 NOTE — Telephone Encounter (Signed)
I called patient to discuss her results using Spanish interpreter ID [phone]. Patient did not answer, left a message asking her to call us back.

## 2021-04-13 NOTE — Telephone Encounter (Signed)
I called patient using Spanish Interpreter ID: [id number]. I discussed patient's lab results with patient. Patient verbalized understanding and appreciation and will follow up as scheduled in February.

## 2021-05-03 ENCOUNTER — Telehealth: Payer: Self-pay | Admitting: Neurology

## 2021-05-03 NOTE — Telephone Encounter (Signed)
Called and LVM in spanish for pt to call back and schedule EEG.

## 2021-05-03 NOTE — Telephone Encounter (Signed)
Orders placed in October, please schedule EEG, - pt speaks spanish

## 2021-05-03 NOTE — Telephone Encounter (Signed)
The brother in law for pt has called and pt has now been scheduled for her EEG on tomorrow with a 10:30 check in.  This is Pharmacist, hospital

## 2021-05-03 NOTE — Telephone Encounter (Signed)
Pt's brother in law has translated for pt, he states pt was told by Dr Leonie Man when she last saw him that he was ordering an EEG for her.  Pt is asking for a call as to when she will be called to schedule the EEG

## 2021-05-04 ENCOUNTER — Other Ambulatory Visit (INDEPENDENT_AMBULATORY_CARE_PROVIDER_SITE_OTHER): Payer: 59 | Admitting: Neurology

## 2021-05-04 DIAGNOSIS — G40909 Epilepsy, unspecified, not intractable, without status epilepticus: Secondary | ICD-10-CM | POA: Diagnosis not present

## 2021-05-05 ENCOUNTER — Telehealth: Payer: Self-pay | Admitting: *Deleted

## 2021-05-05 NOTE — Telephone Encounter (Signed)
-----   Message from Garvin Fila, MD sent at 05/05/2021  8:31 AM EST ----- Kindly inform the patient that EEG or brainwave study was normal no seizure activity noted. ----- Message ----- From: Garvin Fila, MD Sent: 05/04/2021   5:32 PM EST To: Garvin Fila, MD

## 2021-05-05 NOTE — Telephone Encounter (Signed)
I called the patient through the language line. She was provided the EEG results through the interpreter. She was also instructed to continue her medication, keep her pending follow up and contact our office with any concerns.

## 2021-07-26 ENCOUNTER — Encounter: Payer: Self-pay | Admitting: Adult Health

## 2021-07-26 ENCOUNTER — Ambulatory Visit (INDEPENDENT_AMBULATORY_CARE_PROVIDER_SITE_OTHER): Payer: 59 | Admitting: Adult Health

## 2021-07-26 VITALS — BP 106/72 | HR 65 | Ht 60.0 in | Wt 133.0 lb

## 2021-07-26 DIAGNOSIS — G40909 Epilepsy, unspecified, not intractable, without status epilepticus: Secondary | ICD-10-CM

## 2021-07-26 DIAGNOSIS — G08 Intracranial and intraspinal phlebitis and thrombophlebitis: Secondary | ICD-10-CM

## 2021-07-26 DIAGNOSIS — I639 Cerebral infarction, unspecified: Secondary | ICD-10-CM | POA: Diagnosis not present

## 2021-07-26 MED ORDER — LEVETIRACETAM 500 MG PO TABS: ORAL_TABLET | ORAL | 11 refills | Status: DC

## 2021-07-26 NOTE — Patient Instructions (Addendum)
Your Plan:  Continue keppra 500mg  AM and 1000mg  PM for seizure prevention  No driving for at least 6 months after seizure activity  Continue aspirin and atorvastatin for secondary stroke prevention measures and ensure close PCP follow-up for aggressive stroke risk factor management     Follow-up in 6 months or call earlier if needed     Thank you for coming to see [place] at [facility]. I hope we have been able to provide you high quality care today.  You may receive a patient satisfaction survey over the next few weeks. We would appreciate your feedback and comments so that we may continue to improve ourselves and the health of our patients.

## 2021-07-26 NOTE — Progress Notes (Signed)
[facility] [address] [place]. [place] [address] [phone]       STROKE FOLLOW UP NOTE  Erica Rosario Date of Birth:  08/08/1975 Medical Record Number:  [id number]   Reason for Referral: stroke follow up    SUBJECTIVE:   CHIEF COMPLAINT:  Chief Complaint  Patient presents with   Follow-up    Rm 3 with friend and interpreter here for 6 month f/u. Reports she has been doing well since her last f/u.      HPI:   Update 07/26/2021 JM: Returns for follow-up visit after prior visit 4 months ago with Dr. Leonie Man for recent breakthrough seizure and hx of stroke. She is accompanied by a friend and [place] interpreter.  Overall stable since prior visit without any additional seizure activity, compliant on Keppra 500/1000mg  without side effects (prior difficulty tolerating 1000mg  BID dosing which improved after dose adjustment). EEG 04/2021 unremarkable without evidence of seizures.  Stable from stroke standpoint without new stroke/TIA symptoms and stable residual deficit of right peripheral visual impairment.  Compliant on aspirin and atorvastatin without side effects.  Blood pressure today 106/72. At prior visit, topamax discontinued as no headaches but shortly after stopping, expericing reoccurring headaches - PCP restarted Topamax, headaches stable but can occur with increased stress. Recently started on venlafaxine for premenopausal symptoms.  No new concerns at this time.     History provided for reference purposes only Update 04/07/2021 Dr. Leonie Man Patient returns for follow-up after last visit 2 months ago.  She is accompanied by brother and Spanish language interpreter was present throughout this visit.  Patient was seen in the ER on 04/03/2021 with breakthrough seizures.  She presented with visual problems which was similar to what she had during her previous episode of venous sinus thrombosis and subsequently went on to have multiple seizures.  Patient  denied missing any recent doses of medicines.  She was loaded with 2 g of Keppra and Ativan 2 mg.  Her maintenance dose of Keppra was increased to 1 g twice daily.  Patient states she does not remember the episode of the hospital visit.  She states she is done well since then and has not had any further recurrent seizures but she complains of feeling dizzy and having nausea and trouble tolerating the higher dose.  She did also have CT scan of the head as well as CT angiogram of the neck and brain both of which were unremarkable on 04/03/2021.  Patient denies significant headaches or numbness but she is on Topamax 25 mg twice daily and is wondering if she can stop it.  She has no new complaints today.  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  Update 01/18/2021 JM: Erica Rosario returns for 3-month stroke follow-up accompanied by sister and [facility] interpreter.  She was evaluated at [facility] ED 7/29 for dizziness and occipital and frontal headache associated with bright lights in right eye over the past week.  Seen in ED for similar symptoms on 3/7 and diagnosed with iron deficiency anemia possibly in setting of heavy menstrual cycle.  Started menstrual cycle on 7/23 but symptoms much worse compared to prior symptoms. Dizziness improved after meclizine and received IM Reglan for headache.  CT head unremarkable for acute findings.  Lab work unremarkable.  Symptoms have since resolved.  Is scheduled for IUD implant on 8/8 due  to continued heavy irregular menstrual cycles.  Of note, she did have IUD placed 7/12 to help control menstrual cycles with heavy bleeding.  She describes the dizziness as a lightheaded feeling.  She has had vertigo in the past and reports different dizziness symptoms associated with menstrual cycle.  Residual stroke deficit right peripheral visual impairment stable.  Denies any other new  stroke/TIA symptoms. Compliant on warfarin with reported heavy menstrual cycle since starting and atorvastatin without associated side effects. Blood pressure today 110/66. Reports complaince on keppra 500mg  BID without associated side effects and denies any seizure activity.  No further concerns at this time.  Update 09/09/2020 JM: Erica Rosario returns for stroke follow-up after prior visit approximately 4 months ago accompanied by interpreter.   Residual deficits of mild right peripheral visual impairment but is improving.  She denies residual right hand numbness Evaluated in the ED 08/24/2020 with headache, dizziness and visual complaints. MRI and MRV unremarkable for acute findings.  Noted to be anemic with low iron levels which was felt possibly related to heavy menstrual cycles. Headache and dizziness have since resolved. She has since f/u with OB/GYN with plans on placing IUD to help control menstrual cycles.  Also follow-up with PCP who initiated ferrous sulfate. Has f/u next week for repeat lab work Denies new stroke/TIA symptoms  reports bilateral hand finger joint pain with increased stiffness and quick lasting "tremor" with extension of the fingers upon awakening which has been present over the past month.  Denies any weakness, pain in other locations such as wrist or arm or numbness/tingling  Continues on Keppra 500 mg twice daily -tolerating denies any recent seizure activity  Remains on warfarin with INR levels monitored by PCP- per patient, INR level stable - unable to view via epic - aside from heavy menstrual cycles, no other bleeding or bruising concerns Remains on atorvastatin -denies associated side effects  Blood pressure today 110/67 - does not routinely monitor at home  No further concerns at this time  Initial visit 05/12/2020 JM: Erica Rosario is being seen for hospital follow-up accompanied by her brother and interpreter. She reports she has been improving but continues  to have right hand numbness, right peripheral visual impairment and gait impairment due to visual difficulty. She has not done any therapies since discharge due to lack of insurance.  She is in the process of applying for [employer] financial assistance as well as other insurance..  Denies new or worsening stroke/TIA symptoms. 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  She lives with her husband and 2 sons able to maintain ADLs independently and has been slowly returning to completing IADLs.  She continues on Keppra 500 mg twice daily tolerating well and denies any reoccurring seizure activity.  She remains on warfarin without bleeding or bruising with INR levels monitored by PCP weekly. Per patient, INR levels have been greater than 3 so currently doing dosage adjustments per PCP. Remains on atorvastatin without myalgias. Blood pressure today 114/68 - not routinely monitored at home.  She has since discontinued OCP.  No further concerns at this time.  Stroke admission 03/25/2020 JM: Erica Rosario is a 46 y.o. female with no PMH  who presented to [facility] with HA and neck stiffness on 03/25/2020.   Personally reviewed hospitalization pertinent progress notes, lab work and imaging with summary provided.  Shortly transferred to [facility] and evaluated by Dr. 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  Repeat CT head showed hemorrhagic transformation L parietal with slight increase edema with most hemorrhagic foci unchanged and single new hemorrhage at vertex.  Repeat CT head 3 hours later stable. Pan CT negative for malignancy but did show B renal vein nonocclusive thrombosis. CTV and CTA h/n negative LVO with partial thrombosis of the superior sagittal sinus at the vertex, nonocclusive as well can be demonstrated  around the margins of the thrombus with patency beyond that.  Also showed thrombosed superficial draining vein of the left again visible, similar in appearance with low density edema slightly increased and mild swelling with mass-effect and 1 to 2 mm of left-to-right shift and flattening of the left lateral ventricle.  Questionable hypercoagulable state with hypercoagulable showed anticardiolipin IgM 19 (indeterminate) and protein S total and active mildly low but likely inaccurate on heparin.  Advise to discontinue OCP use.  Placed on heparin IV and warfarin daily. GTC in ER s/p Ativan and Keppra load and initiated Keppra 500 mg twice daily with LTM showing left frontal temporal continuous slowing and bilateral parieto-occipital spikes.  LDL UTC with direct LDL of 151 no statin PTA and initiated atorvastatin 40 mg daily.  No prior history of HTN or DM.  Other stroke risk factors include history of EtOH use and overweight but no prior stroke history.  Other active problems include anemia, leukocytosis and hypokalemia.  Evaluated by therapies and recommended discharge to CIR for ongoing therapy needs.  She was discharged to CIR on 03/31/2020 and discharged home on 04/03/2020 on Keppra, Topamax and warfarin.    She unfortunately returned on 04/04/2020 with complaint of headache, generalized weakness and "faintness" as well as nauseated and several episodes of emesis but no focal deficits noted.  She was found to have worsening vasogenic edema in left parieto-occipital region and worsening midline shift of 13 mm.  Warfarin discontinued and reversed with vitamin K.  Administering mannitolx1, Decadron, hypertonic saline and heparin drip.  Repeat CT head showed improved MLS, cerebral edema and decreased hydrocephalus and again repeat CT head showed stable edema with decreasing IPH and same 1 cm midline shift. NSG on board PRN.  Also found to have severe anemia receiving 2U PRBC with improvement potentially contributing  to syncopal event on 10/15 at home.  Elevated liver enzymes of unclear etiology possibly related to Topamax therefore DC'd.  Restarted warfarin with INR goal 2-3 in addition to Lovenox for total of 4 doses.  Advised to follow-up with [facility] on 10/25 for pro time blood test and CBC as well as INR levels and adjustment of warfarin as indicated and possible discontinuation of Lovenox.  Discharged on dexamethasone.  Reevaluated by therapy recommended outpatient OT and no PT needs.  She was discharged home on 04/11/2020.   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 Also thrombosis draining cortical vein. 2 areas enhancement in L occipital hemorrhage possible ongoing hemorrhage. Similar edema w/ slight R shift. CTA and CTV 10/9 - Partial thrombosis of the superior sagittal sinus at the vertex, Thrombosed superficial draining vein on the left, Hemorrhagic infarction in the left parietal cortical and subcortical brain  Pan CT neg for malignancy, B renal vein nonocclusive thrombosis. CTV 10/16 Decreased volume of nonocclusive superior sagittal sinus Thrombus. Decreased conspicuity of thrombus within an adjacent left cortical vein. Repeat CT head improved MLS, cerebral edema and decreased hydrocephalus  2D Echo EF 60-65%. No source of embolus  LE dopplers 10/19 - no DVT  LDL UTC, TC 210, TG 132, direct LDL 151.9 HgbA1c 5.6 UDS +benzos Hypercoagulable labs unremarkable (anticardiolipin IgM slightly elevated, prot S total and activity mildly low - not accurate on heparin) VTE prophylaxis - heparin IV   No antithrombotic prior to admission, now on full dose lovenox. On coumadin with INR goal 2-3.   Today INR 1.5 Decadron 4mg  IV q6h ->2mg  IV q 6h -> 2mg  po Q6h -> 2mg  Q8  Therapy recommendations:  Outpt OT, no PT - gave pt info for pro bono therapies at [facility] clinic Disposition:  home  Cerebral Edema CT head Left parieto-occipital venous infarct with increased edema and mass effect including 13 mm of rightward midline shift. New mild dilatation of the right lateral ventricle suggesting early trapping Repeat CT improved MLS, cerebral edema and decreased hydrocephalus CT repeat stable edema, decreasing IPH. Same 1cm midline shift  NSG on board PRN S/p mannitol x 1 Decadron 10mg  x 1 -> 4mg  Q6 -> 2mg  Q6 ->2mg  Q8 -> tapering On 3% at 75 -> 40 cc / hr -> 20cc->off Increase topomax to 50mg  bid -> d/c PICC 10/18->10/22 removed      ROS:   14 system review of systems performed and negative with exception of those listed in HPI  PMH:  Past Medical History:  Diagnosis Date   Intracerebral hemorrhage (HCC)     PSH: No past surgical history on file.  Social History:  Social History   Socioeconomic History   Marital status: Married    Spouse name: Not on file   Number of children: Not on file   Years of education: Not on file   Highest education level: Not on file  Occupational History   Not on file  Tobacco Use   Smoking status: Never   Smokeless tobacco: Never  Substance and Sexual Activity   Alcohol use: Not Currently   Drug use: Never   Sexual activity: Not on file  Other Topics Concern   Not on file  Social History Narrative   Not on file   Social Determinants of Health   Financial Resource Strain: Not on file  Food Insecurity: Not on file  Transportation Needs: Not on file  Physical Activity: Not on file  Stress: Not on file  Social Connections: Not on file  Intimate Partner Violence: Not on file    Family History:  Family History  Problem Relation Age of Onset   Breast cancer Neg Hx     Medications:   Current Outpatient Medications on File  Prior to Visit  Medication Sig Dispense Refill   aspirin EC 81 MG tablet Take 1 tablet (81 mg total) by mouth daily. Swallow whole. 30 tablet 11   levETIRAcetam (KEPPRA) 1000 MG tablet Take 1/2 tablet in am and 1 tablet at night 60 tablet 2   pantoprazole (PROTONIX) 40 MG tablet Take 1 tablet (40 mg total) by mouth daily. 30 tablet 0   topiramate (TOPAMAX) 25 MG tablet Take 25 mg by mouth daily.     venlafaxine XR (EFFEXOR-XR) 75 MG 24 hr capsule Take 75 mg by mouth daily.     atorvastatin (LIPITOR) 40 MG tablet TAKE  1 TABLET (40 MG TOTAL) BY MOUTH DAILY. (Patient taking differently: Take 40 mg by mouth daily.) 30 tablet 0   No current facility-administered medications on file prior to visit.    Allergies:  No Known Allergies    OBJECTIVE:  Physical Exam  Vitals:   07/26/21 0922  BP: 106/72  Pulse: 65  SpO2: 98%  Weight: 133 lb (60.3 kg)  Height: 5' (1.524 m)     Body mass index is 25.97 kg/m. No results found.  General: well developed, well nourished, very pleasant middle-aged Hispanic female, seated, in no evident distress Head: head normocephalic and atraumatic.   Neck: supple with no carotid or supraclavicular bruits Cardiovascular: regular rate and rhythm, no murmurs Musculoskeletal: no deformity Skin:  no rash/petichiae Vascular:  Normal pulses all extremities   Neurologic Exam Mental Status: Awake and fully alert. Limited English primarily Spanish-speaking. Denies dysarthria or aphasia.  Oriented to place and time. Recent and remote memory intact. Attention span, concentration and fund of knowledge appropriate. Mood and affect appropriate.  Cranial Nerves: Pupils equal, briskly reactive to light. Extraocular movements full without nystagmus. Visual fields right homonymous inferior quadrantanopia. Hearing intact. Facial sensation intact. Face, tongue, palate moves normally and symmetrically.  Motor: Normal bulk and tone. Normal strength in all tested extremity  muscles. Sensory.: intact to touch , pinprick , position and vibratory sensation.  Coordination: Rapid alternating movements normal in all extremities. Finger-to-nose and heel-to-shin performed accurately bilaterally.  No evidence of action or rest tremors or abnormal hand movements Gait and Station: Arises from chair without difficulty. Stance is normal. Gait demonstrates normal stride length and balance without use of assistive device.  Able to perform tandem walk and heel toe without difficulty.   Reflexes: 1+ and symmetric. Toes downgoing.       ASSESSMENT: LILIANN FILE is a 46 y.o. 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 On 04/04/2020 (d/c'd home from [facility] on 10/15 in stable condition) with worsening headache, N/V and generalized weakness found to have worsening vasogenic edema in left parieto-occipital region and worsening midline shift of 13 mm requiring reversal of warfarin, mannitol x1, Decadron and hypertonic saline with improvement.  Hospital course complicated by severe anemia, leukocytosis and fever, and elevated liver enzymes (? R/t Topamax).  Vascular risk factors include anemia, HLD and OCP use.     PLAN:  Sagittal sinus thrombosis w/ hemorrhagic transformation:  Residual deficit: Right inferior homonymous quadrantanopia  MRV 08/24/2020 no evidence of residual thrombosis Continue aspirin 81 mg daily and atorvastatin for secondary stroke prevention.  Discussed secondary stroke prevention measures and importance of close PCP follow up for aggressive stroke risk factor management  HLD: LDL goal<70.  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  Most recent seizure 03/2021. No additional seizures.   Continue Keppra 500mg  Am and 1000mg  PM.  Refill provided Intolerant to higher dosage. If additional breakthrough seizure, will need to consider adding additional agent or changing therapy  Discussed avoiding seizure provoking triggers and ensuring medication compliance Chronic headaches: PCP restarted topiramate due to recurrent headaches in setting of increased stressors. Recommend continuing but if no headaches over the next 3 months, can stop. She  has also been started on venlafaxine for premenopausal symptoms which can help control headaches    Follow up in 6 months or call earlier if needed   CC:  Center, [facility]    I spent 33 minutes of face-to-face and non-face-to-face time with patient, friend and interpreter.  This included previsit chart review, lab review, study review, order entry, electronic health record documentation, patient education regarding prior stroke and residual deficits, secondary stroke prevention measures and aggressive stroke risk factor management, indication for repeat lab work, seizures and chronic headaches and answered all other questions to patients satisfaction  Frann Rider, AGNP-BC  [facility] [address] [place], [place] [address]  Phone [phone] Fax [phone] Note: This document was prepared with digital dictation and possible smart phrase technology. Any transcriptional errors that result from this process are unintentional.

## 2021-09-10 ENCOUNTER — Other Ambulatory Visit: Payer: Self-pay

## 2021-09-10 ENCOUNTER — Emergency Department: Payer: 59

## 2021-09-10 ENCOUNTER — Emergency Department
Admission: EM | Admit: 2021-09-10 | Discharge: 2021-09-10 | Disposition: A | Discharge status: Home / Self Care | Payer: 59 | Attending: Emergency Medicine | Admitting: Emergency Medicine

## 2021-09-10 DIAGNOSIS — R569 Unspecified convulsions: Secondary | ICD-10-CM | POA: Diagnosis present

## 2021-09-10 DIAGNOSIS — G40909 Epilepsy, unspecified, not intractable, without status epilepticus: Secondary | ICD-10-CM | POA: Insufficient documentation

## 2021-09-10 LAB — COMPREHENSIVE METABOLIC PANEL
ALT: 32 U/L (ref 0–44)
AST: 37 U/L (ref 15–41)
Albumin: 4.2 g/dL (ref 3.5–5.0)
Alkaline Phosphatase: 98 U/L (ref 38–126)
Anion gap: 15 (ref 5–15)
BUN: 12 mg/dL (ref 6–20)
CO2: 18 mmol/L — ABNORMAL LOW (ref 22–32)
Calcium: 9.5 mg/dL (ref 8.9–10.3)
Chloride: 106 mmol/L (ref 98–111)
Creatinine, Ser: 0.7 mg/dL (ref 0.44–1.00)
GFR, Estimated: >60 mL/min (ref >60)
Glucose, Bld: 96 mg/dL (ref 70–99)
Potassium: 3.6 mmol/L (ref 3.5–5.1)
Sodium: 139 mmol/L (ref 135–145)
Total Bilirubin: 0.7 mg/dL (ref 0.3–1.2)
Total Protein: 8.3 g/dL — ABNORMAL HIGH (ref 6.5–8.1)

## 2021-09-10 LAB — CBC
HCT: 40.6 % (ref 36.0–46.0)
Hemoglobin: 13.7 g/dL (ref 12.0–15.0)
MCH: 32.5 pg (ref 26.0–34.0)
MCHC: 33.7 g/dL (ref 30.0–36.0)
MCV: 96.4 fL (ref 80.0–100.0)
Platelets: 253 10*3/uL (ref 150–400)
RBC: 4.21 MIL/uL (ref 3.87–5.11)
RDW: 12.4 % (ref 11.5–15.5)
WBC: 8 10*3/uL (ref 4.0–10.5)
nRBC: 0 % (ref 0.0–0.2)

## 2021-09-10 MED ORDER — LEVETIRACETAM IN NACL 1000 MG/100ML IV SOLN
1000.0000 mg | Freq: Once | INTRAVENOUS | Status: AC
  Administered 2021-09-10: 1000 mg via INTRAVENOUS
  Filled 2021-09-10: qty 100

## 2021-09-10 MED ORDER — SODIUM CHLORIDE 0.9 % IV BOLUS
1000.0000 mL | Freq: Once | INTRAVENOUS | Status: AC
  Administered 2021-09-10: 1000 mL via INTRAVENOUS

## 2021-09-10 MED ORDER — LEVETIRACETAM 500 MG PO TABS: 1500.0000 mg | ORAL_TABLET | Freq: Two times a day (BID) | ORAL | 0 refills | Status: DC

## 2021-09-10 MED ORDER — LORAZEPAM 2 MG/ML IJ SOLN
2.0000 mg | Freq: Once | INTRAMUSCULAR | Status: AC
  Administered 2021-09-10: 2 mg via INTRAVENOUS
  Filled 2021-09-10: qty 1

## 2021-09-10 NOTE — ED Notes (Signed)
Pt arrived via EMS. Pt placed on stretcher and seizure pads in place. Pt on all monitors and post ictal.  ?

## 2021-09-10 NOTE — ED Notes (Addendum)
Pt found having another seizure. Provider at bedside and ativan administered. Pt suctioned and placed on 2L nasal cannula at this time. Pt post ictal but resting comfortably at this time. All vitals WNL. Still awaiting family arrival.  ?

## 2021-09-10 NOTE — Discharge Instructions (Addendum)
Please increase your Keppra to 1500 mg twice daily as prescribed.  Please follow-up with your neurologist as soon as possible.  Return to the emergency department for any further seizure activity. ?

## 2021-09-10 NOTE — ED Notes (Signed)
Pt's husband at bedside. Family endorses pt's hx of seizures and pt does take kepra at home. Last known seizure was October prior to today. Pt still post ictal. IV fluids and kepra started at this time and pt placed on purewick for urine sample.  ?

## 2021-09-10 NOTE — ED Provider Notes (Signed)
? ?[name] ?Provider Note ? ? ? Event Date/Time  ? First MD Initiated Contact with Patient 09/10/21 1237   ?  (approximate) ? ?History  ? ?Chief Complaint: Seizures (Pt had witnessed tonic clonic seizure lasting approximately 5 minutes. Pt has hx of same, last seizure was in October. Unknown if patient takes medications for seizures. Pt speaks spanish and is post ictal on arrival. Pt alert and moaning, but not answering questions. ) ? ?HPI ? ?Erica Rosario is a 46 y.o. female with a past medical history of prior intracerebral hemorrhage, seizure disorder on Keppra who presents to the emergency department for a seizure.  According to report patient had a witnessed tonic-clonic seizure lasting approximately 5 minutes.  EMS states family reported last seizure was in October.  Upon arrival patient is postictal attempted to speak with the patient with Spanish interpreter but the patient is unable to answer questions at this time or even attempt answer questions due to confusion/postictal state.  Patient is not able to provide additional history currently.  Awaiting family arrival. ? ?Physical Exam  ? ?Triage Vital Signs: ?ED Triage Vitals  ?Enc Vitals Group  ?   BP 09/10/21 1241 115/61  ?   Pulse Rate 09/10/21 1241 (!) 104  ?   Resp 09/10/21 1241 (!) 22  ?   Temp 09/10/21 1241 98.2 ?F (36.8 ?C)  ?   Temp Source 09/10/21 1241 Oral  ?   SpO2 09/10/21 1241 100 %  ?   Weight 09/10/21 1240 132 lb 15 oz (60.3 kg)  ?   Height 09/10/21 1240 5' (1.524 m)  ?   Head Circumference --   ?   Peak Flow --   ?   Pain Score --   ?   Pain Loc --   ?   Pain Edu? --   ?   Excl. in [place]? --   ? ? ?Most recent vital signs: ?Vitals:  ? 09/10/21 1241  ?BP: 115/61  ?Pulse: (!) 104  ?Resp: (!) 22  ?Temp: 98.2 ?F (36.8 ?C)  ?SpO2: 100%  ? ? ?General: Patient is awake but appears confused, postictal unable to who answer questions or follow commands.  Pulling at her monitor wires. ?CV:  Good peripheral perfusion.  Regular  rate and rhythm around 100 bpm ?Resp:  Normal effort.  Equal breath sounds bilaterally.  No obvious wheeze rales or rhonchi ?Abd:  No distention.  Soft, no obvious reaction to abdominal palpation however patient does hold her abdomen at times even when not palpating. ?Other:  Appears postictal, confused unable to follow commands or answer questions at this time. ? ? ?ED Results / Procedures / Treatments  ? ?RADIOLOGY ? ?I personally reviewed the patient's CT images appears to have an old infarct but no acute abnormality. ?Radiology is read the CT similarly. ? ? ?MEDICATIONS ORDERED IN ED: ?Medications  ?LORazepam (ATIVAN) injection 2 mg (has no administration in time range)  ? ? ? ?IMPRESSION / MDM / ASSESSMENT AND PLAN / ED COURSE  ?I reviewed the triage vital signs and the nursing notes. ? ?Patient presents to the emergency department for a witnessed tonic-clonic seizure lasting approximately 5 minutes.  Upon arrival patient appears confused and postictal.  Attempted to interview the patient with Spanish interpreter she is unable to answer questions currently.  Shortly after arrival patient had an additional tonic-clonic seizure lasting approximately 1 to 2 minutes.  Patient given 2 mg of IV Ativan.  I reviewed  the patient's record she appears to be on Keppra I have ordered 1000 mg of IV Keppra load for the patient.  We will check labs.  Given the patient's history of a prior intracranial hemorrhage we will obtain a CT scan of the head.  We will continue to closely monitor in the emergency department while awaiting results. ? ?I reviewed the patient's last discharge summary from 04/03/2021 which time the patient was admitted for seizures with a negative head CT.  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  Was on Coumadin however this was discontinued due to anemia and menorrhagia. ? ?Patient's work-up today is reassuring.  Chemistry and CBC are  largely within normal limits.  CT scan of the head shows no acute abnormality.  Given the patient's 2 seizures today I did discuss the patient with Dr. Rory Percy of neurology, he wants to increase the patient's Keppra to 1500 mg twice daily have the patient follow-up with her neurologist at [facility] neurology.  Spoke to the patient and husband regarding this.  They are agreeable plan of care.  Patient is awake alert oriented x3.  States she is feeling much better. ? ?FINAL CLINICAL IMPRESSION(S) / ED DIAGNOSES  ? ?Recurrent seizures ? ?Rx / DC Orders  ? ?Increase Keppra to 1500 mg twice daily. ? ?Note:  This document was prepared using Dragon voice recognition software and may include unintentional dictation errors. ?  ?Harvest Dark, MD ?09/10/21 1553 ? ?

## 2021-09-11 IMAGING — CT CT ANGIO NECK
1 of 11 series · 5 of 33 positions shown · IV contrast (OMNI 350)
Comparison: Multiple previous exams most recently 03/25/2020

CLINICAL DATA: Follow-up venous sinus thrombosis with left parietal
venous infarction.

EXAM:
CT HEAD WITHOUT CONTRAST
CT arteriography and venography OF THE HEAD and neck
TECHNIQUE: Contiguous axial images were obtained from the base of the skull
through the vertex without intravenous contrast. Multidetector CT
imaging of the head was performed using the standard protocol during
bolus administration of intravenous contrast. Multiplanar CT image
reconstructions and MIPs were obtained to evaluate the vascular
anatomy.
CONTRAST:  75mL OMNIPAQUE IOHEXOL 350 MG/ML SOLN

[Series 10: cta neck axial · axial · 0.44mm/px · z∈[-224,-4]mm · 5 of 334 slices shown]
[im 56/334  soft-tissue]
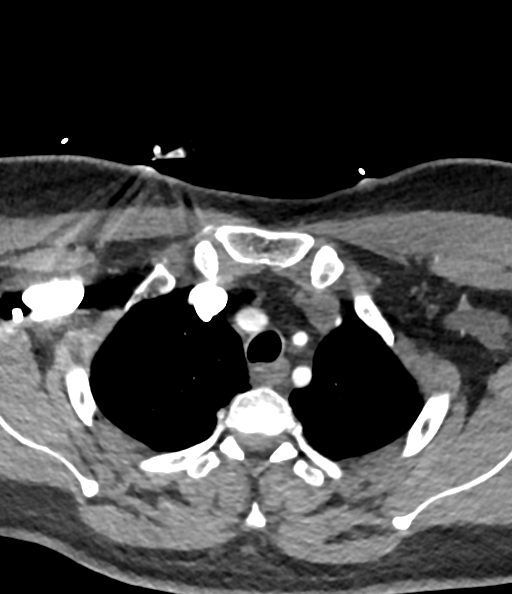
[im 112/334  bone]
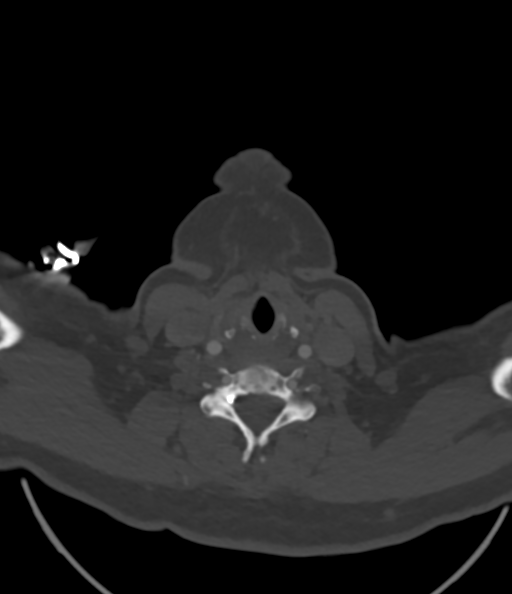
[im 167/334  soft-tissue]
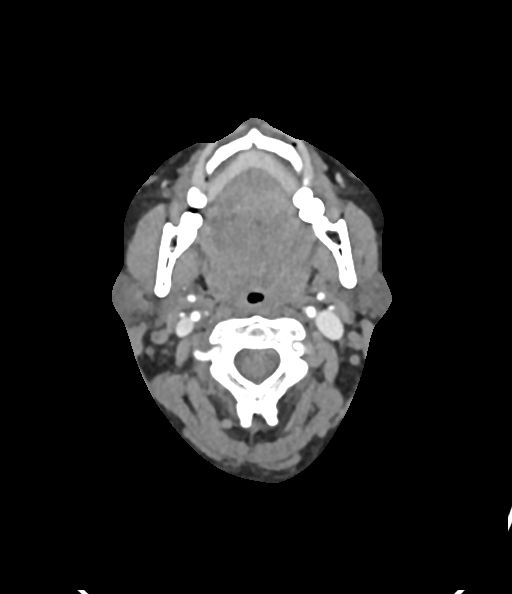
[im 223/334  bone]
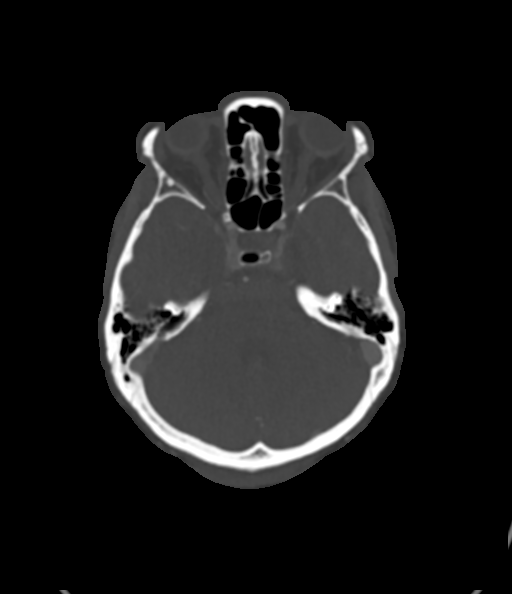
[im 278/334  soft-tissue]
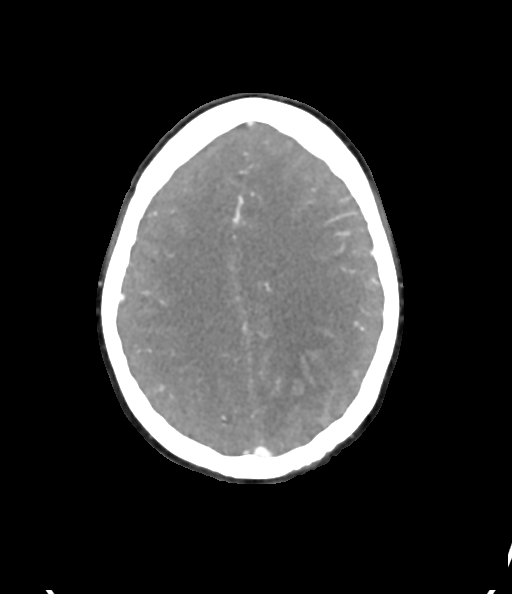

[5 of 33 positions shown; findings below may reference images not displayed]

FINDINGS: CT HEAD

Brain: Hemorrhagic infarction in the left parietal cortical and
subcortical brain redemonstrated. Low-density is slightly increased,
particularly along the anterior margin. Hemorrhagic foci are stable
with no new or enlarging bleeds. Mild swelling with mass-effect and
1 or 2 mm of left-to-right shift and flattening of the left lateral
ventricle.

Vascular: See below

Skull: Negative

Sinuses/Orbits: Clear/normal

CTA neck:

Aortic arch is normal.

Right common carotid artery is widely patent to the bifurcation.
Normal carotid bifurcation. Normal cervical ICA.

Left common carotid artery widely patent to the bifurcation. Carotid
bifurcation is normal. Normal left ICA.

Both vertebral arteries widely patent at their origins and through
the cervical region to the foramen magnum.

CTA HEAD

Anterior circulation: Both internal carotid arteries widely patent
through the skull base and siphon regions. The anterior and middle
cerebral vessels are normal without proximal stenosis, aneurysm or
vascular malformation.

Posterior circulation: Both vertebral arteries widely patent to the
basilar. No basilar stenosis. Posterior circulation branch vessels
are normal.

Venous sinuses: See results of CT venography.

Anatomic variants: None significant.

CT venography of the head

Anterior aspect of the superior sagittal sinus is patent. Again
demonstrated is partial thrombosis of the superior sagittal sinus at
the vertex. This is not occlusive as flow can be demonstrated around
the margins of the thrombus. This is fairly segmental, involving a
5-6 cm segment of the sinus. Beyond that, the distal superior
sagittal sinus is patent. Both transverse sinuses are patent.
Compared to the initial presentation noncontrast CT and MRI, the
thrombus does not appear progressive. Thrombosed superficial
draining vein on the left again visible, similar in appearance.
IMPRESSION: 1. No arterial pathology in the neck.
2. No intracranial large or medium vessel occlusion or correctable
proximal stenosis.
3. Partial thrombosis of the superior sagittal sinus at the vertex,
not occlusive as flow can be demonstrated around the margins of the
thrombus. This is fairly segmental, involving a 5-6 cm segment of
the sinus. Beyond that, the distal superior sagittal sinus is
patent. Thrombosed superficial draining vein on the left again
visible, similar in appearance.
4. Hemorrhagic infarction in the left parietal cortical and
subcortical brain redemonstrated. Low-density edema is slightly
increased, particularly along the anterior margin. No new or
enlarging bleeds. Mild swelling with mass-effect and 1 or 2 mm of
left-to-right shift and flattening of the left lateral ventricle.

## 2021-09-13 ENCOUNTER — Telehealth: Payer: Self-pay | Admitting: Adult Health

## 2021-09-13 NOTE — Telephone Encounter (Addendum)
Pt's brother Cassandria Santee called stating that she was seen in the ER due to a seizure and they recommended her to be seen as soon as possible. Checked providers schedule and nothing is available sooner than May. Brother is not on recent DPR but pt is next to him and granted permission for him to translate for her. Pt will be with brother when call is returned . Please call [phone] ?

## 2021-09-13 NOTE — Telephone Encounter (Signed)
Called and scheduled pt for 3/28 9:45. Appt has been changed to an OV. Pt's brother will be bringing her and was informed to have pt to check in at 9:15am. Brother verbalized understanding. ?

## 2021-09-13 NOTE — Telephone Encounter (Signed)
Please offer pt botox slots 3/28 or 4/3 at 945. I can convert to OV if accepted.  ?

## 2021-09-13 NOTE — Telephone Encounter (Signed)
FYI

## 2021-09-14 ENCOUNTER — Ambulatory Visit (INDEPENDENT_AMBULATORY_CARE_PROVIDER_SITE_OTHER): Payer: 59 | Admitting: Adult Health

## 2021-09-14 ENCOUNTER — Encounter: Payer: Self-pay | Admitting: Adult Health

## 2021-09-14 VITALS — BP 103/66 | HR 57 | Ht 60.0 in | Wt 135.0 lb

## 2021-09-14 DIAGNOSIS — I639 Cerebral infarction, unspecified: Secondary | ICD-10-CM

## 2021-09-14 DIAGNOSIS — G08 Intracranial and intraspinal phlebitis and thrombophlebitis: Secondary | ICD-10-CM | POA: Diagnosis not present

## 2021-09-14 DIAGNOSIS — G40909 Epilepsy, unspecified, not intractable, without status epilepticus: Secondary | ICD-10-CM

## 2021-09-14 MED ORDER — LEVETIRACETAM 500 MG PO TABS: ORAL_TABLET | ORAL | 5 refills | Status: DC

## 2021-09-14 NOTE — Patient Instructions (Signed)
Reduce keppra to '1000mg'$  AM and '1500mg'$  nightly for seizure prevention ? ?Please call with any seizure activity ? ?Please ensure you avoid seizure provoking triggers as mentioned below ? ?You can look in to a life alert button which can detect when you fall incase of recurrent seizure. You can also look into having cameras placed around your home.  ? ?Please schedule an EEG to ensure no underlying issues that could have caused recurrent seizure ? ? ? ? ?

## 2021-09-14 NOTE — Progress Notes (Signed)
?[facility] ?[address] ?[place]. [place] [address] ?[phone] ? ?     OFFICE FOLLOW UP NOTE ? ?[name] ?Date of Birth:  19-Mar-1976 ?Medical Record Number:  [id number]  ? ?Reason for visit: Recent seizure ? ? ? ?SUBJECTIVE: ? ? ?CHIEF COMPLAINT:  ?Chief Complaint  ?Patient presents with  ? Follow-up  ?  Rm 2 with sister Erica Rosario (has [employer] interpreter) ?Pt is well, here to FU on recent sz  ? ? ? ?HPI:  ? ?Update 09/14/2021 JM: Patient returns for acute visit due to recent seizure activity.  She is accompanied by her sister and [facility] interpreter.  She presented to [facility] ED on 09/10/2021 for a witnessed generalized tonic-clonic seizure lasting approximately 5 minutes.  She was confused and postictal upon arrival.  She had additional tonic-clonic seizure in ED lasting approximately 1 to 2 minutes, received IV Ativan and Keppra load.  Lab work largely within normal limits and CTH no acute abnormality.  Due to likely unprovoked seizure, Keppra dosage increased to 1500 mg twice daily. ? ?Since discharge, she has been doing well without any reoccurring seizure activity.  Remains on Keppra 1500 mg twice daily but does complain on dizziness and nauseous since starting which was a prior compliant on higher keppra dosage. Prior to seizure onset, denies any missed dosages, significantly elevated stress levels, recent medication changes or any potential seizure provoking triggers.  ? ? ? ? ?History provided for reference purposes only ?Update 07/26/2021 JM: Returns for follow-up visit after prior visit 4 months ago with Dr. Leonie Rosario for recent breakthrough seizure and hx of stroke. She is accompanied by a friend and [employer] interpreter.  Overall stable since prior visit without any additional seizure activity, compliant on Keppra 500/'1000mg'$  without side effects (prior difficulty tolerating '1000mg'$  BID dosing which improved after dose adjustment). EEG 04/2021 unremarkable without evidence  of seizures.  Stable from stroke standpoint without new stroke/TIA symptoms and stable residual deficit of right peripheral visual impairment.  Compliant on aspirin and atorvastatin without side effects.  Blood pressure today 106/72. At prior visit, topamax discontinued as no headaches but shortly after stopping, expericing reoccurring headaches - PCP restarted Topamax, headaches stable but can occur with increased stress. Recently started on venlafaxine for premenopausal symptoms.  No new concerns at this time.  ? ?Update 04/07/2021 Dr. Leonie Rosario Patient returns for follow-up after last visit 2 months ago.  She is accompanied by brother and Spanish language interpreter was present throughout this visit.  Patient was seen in the ER on 04/03/2021 with breakthrough seizures.  She presented with visual problems which was similar to what she had during her previous episode of venous sinus thrombosis and subsequently went on to have multiple seizures.  Patient denied missing any recent doses of medicines.  She was loaded with 2 g of Keppra and Ativan 2 mg.  Her maintenance dose of Keppra was increased to 1 g twice daily.  Patient states she does not remember the episode of the hospital visit.  She states she is done well since then and has not had any further recurrent seizures but she complains of feeling dizzy and having nausea and trouble tolerating the higher dose.  She did also have CT scan of the head as well as CT angiogram of the neck and brain both of which were unremarkable on 04/03/2021.  Patient denies significant headaches or numbness but she is on Topamax 25 mg twice daily and is wondering if she  can stop it.  She has no new complaints today.  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 ? ?Update 01/18/2021 JM: Erica Rosario returns for 45-monthstroke follow-up accompanied by sister and [facility].   [facility] and occipital and frontal headache associated with bright lights in right eye over the past week.  Seen in ED for similar symptoms on 3/7 and diagnosed with iron deficiency anemia possibly in setting of heavy menstrual cycle.  Started menstrual cycle on 7/23 but symptoms much worse compared to prior symptoms. Dizziness improved after meclizine and received IM Reglan for headache.  CT head unremarkable for acute findings.  Lab work unremarkable.  Symptoms have since resolved.  Is scheduled for IUD implant on 8/8 due to continued heavy irregular menstrual cycles.  Of note, she did have IUD placed 7/12 to help control menstrual cycles with heavy bleeding.  She describes the dizziness as a lightheaded feeling.  She has had vertigo in the past and reports different dizziness symptoms associated with menstrual cycle. ? ?Residual stroke deficit right peripheral visual impairment stable.  Denies any other new stroke/TIA symptoms. Compliant on warfarin with reported heavy menstrual cycle since starting and atorvastatin without associated side effects. Blood pressure today 110/66. Reports complaince on keppra '500mg'$  BID without associated side effects and denies any seizure activity. ? ?No further concerns at this time. ? ?Update 09/09/2020 JM: [name] for stroke follow-up after prior visit approximately 4 months ago accompanied by interpreter.  ? ?Residual deficits of mild right peripheral visual impairment but is improving.  ?She denies residual right hand numbness ?Evaluated in the ED 08/24/2020 with headache, dizziness and visual complaints. MRI and MRV unremarkable for acute findings.  Noted to be anemic with low iron levels which was felt possibly related to heavy menstrual cycles. Headache and dizziness have since resolved. She has since f/u with OB/GYN with plans on placing IUD to help control menstrual cycles.  Also follow-up with PCP who initiated ferrous  sulfate. Has f/u next week for repeat lab work ?Denies new stroke/TIA symptoms ? ?reports bilateral hand finger joint pain with increased stiffness and quick lasting "tremor" with extension of the fingers upon awakening which has been present over the past month.  Denies any weakness, pain in other locations such as wrist or arm or numbness/tingling ? ?Continues on Keppra 500 mg twice daily -tolerating denies any recent seizure activity ? ?Remains on warfarin with INR levels monitored by PCP- per patient, INR level stable - unable to view via epic - aside from heavy menstrual cycles, no other bleeding or bruising concerns ?Remains on atorvastatin -denies associated side effects  ?Blood pressure today 110/67 - does not routinely monitor at home ? ?No further concerns at this time ? ?Initial visit 05/12/2020 JM: Ms. MKilbrideis being seen for hospital follow-up accompanied by her brother and interpreter. She reports she has been improving but continues to have right hand numbness, right peripheral visual impairment and gait impairment due to visual difficulty. She has not done any therapies since discharge due to lack of insurance.  She is in the process of applying for [employer] financial assistance as well as other insurance..  Denies new or worsening stroke/TIA symptoms. 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  She lives with  her husband and 2 sons able to maintain ADLs independently and has been slowly returning to completing IADLs.  She continues on Keppra 500 mg twice daily tolerating well and denies any reoccurring seizure activity.  She remains on warfarin without bleeding or bruising with INR levels monitored by PCP weekly. Per patient, INR levels have been greater than 3 so currently doing dosage adjustments per PCP. Remains on atorvastatin without myalgias. Blood pressure today 114/68 - not routinely monitored at home.  She has since discontinued OCP.  No  further concerns at this time. ? ?Stroke admission 03/25/2020 JM: ?Ms. ALEKA TWITTY is a 46 y.o. female with no PMH  who presented to [facility] with HA and neck stiffness on 03/25/2020

## 2021-09-22 ENCOUNTER — Ambulatory Visit (INDEPENDENT_AMBULATORY_CARE_PROVIDER_SITE_OTHER): Payer: 59 | Admitting: Neurology

## 2021-09-22 DIAGNOSIS — G40909 Epilepsy, unspecified, not intractable, without status epilepticus: Secondary | ICD-10-CM | POA: Diagnosis not present

## 2021-10-05 ENCOUNTER — Telehealth: Payer: Self-pay

## 2021-10-05 NOTE — Telephone Encounter (Signed)
Leg pain initially thought to possible muscle strain during seizure event. If this is still present and not improving, would recommend f/u with PCP to ensure she did not cause any injury to herself. If this has been gradually improving, it will likely continue to get better. Agree with c/o heart burn and driving recommendations. Thank you.  ?

## 2021-10-05 NOTE — Telephone Encounter (Signed)
-----   Message from Frann Rider, NP sent at 10/05/2021  8:32 AM EDT ----- ?Please notify patient that recent EEG was normal without any abnormalities.  Please continue current Keppra dosage and to call with any reoccurring seizure activity ?

## 2021-10-05 NOTE — Telephone Encounter (Signed)
Contacted pt with Interpreter Verdene Lennert, informed her recent EEG was normal without any abnormalities.  Please continue current Keppra dosage and to call with any reoccurring seizure activity. She stated she still having numbness and pain in Leg and wants to do about that. Pt also said she was having heartburn, advised to contact PCP or try an over the counter medication to help with heartburn. 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  ?

## 2021-10-05 NOTE — Telephone Encounter (Signed)
Contacted pt with interpreter Adrianna, informed her Erica Rosario recommends she FU with PCP to ensure she did not cause any injury to herself. If this has been gradually improving, it will likely continue to get better. She verbally understood and was appreciative.  ?

## 2021-11-19 ENCOUNTER — Other Ambulatory Visit: Payer: Self-pay | Admitting: Family Medicine

## 2021-11-19 DIAGNOSIS — Z30431 Encounter for routine checking of intrauterine contraceptive device: Secondary | ICD-10-CM

## 2021-11-30 ENCOUNTER — Ambulatory Visit
Admission: RE | Admit: 2021-11-30 | Discharge: 2021-11-30 | Disposition: A | Discharge status: Home / Self Care | Payer: 59 | Source: Ambulatory Visit | Attending: Family Medicine | Admitting: Family Medicine

## 2021-11-30 DIAGNOSIS — Z30431 Encounter for routine checking of intrauterine contraceptive device: Secondary | ICD-10-CM | POA: Diagnosis present

## 2021-11-30 DIAGNOSIS — N859 Noninflammatory disorder of uterus, unspecified: Secondary | ICD-10-CM | POA: Diagnosis not present

## 2021-11-30 DIAGNOSIS — N938 Other specified abnormal uterine and vaginal bleeding: Secondary | ICD-10-CM | POA: Insufficient documentation

## 2021-12-16 ENCOUNTER — Other Ambulatory Visit: Payer: Self-pay | Admitting: Obstetrics and Gynecology

## 2021-12-16 NOTE — Progress Notes (Signed)
Low ferritin , fatigue , scheduled for hysterectomy

## 2021-12-17 ENCOUNTER — Other Ambulatory Visit: Payer: Self-pay | Admitting: Obstetrics and Gynecology

## 2021-12-22 ENCOUNTER — Ambulatory Visit
Admission: RE | Admit: 2021-12-22 | Discharge: 2021-12-22 | Disposition: A | Discharge status: Home / Self Care | Payer: 59 | Source: Ambulatory Visit | Attending: Obstetrics and Gynecology | Admitting: Obstetrics and Gynecology

## 2021-12-22 DIAGNOSIS — E611 Iron deficiency: Secondary | ICD-10-CM | POA: Diagnosis not present

## 2021-12-22 DIAGNOSIS — R5383 Other fatigue: Secondary | ICD-10-CM | POA: Diagnosis present

## 2021-12-22 MED ORDER — SODIUM CHLORIDE 0.9 % IV SOLN
300.0000 mg | INTRAVENOUS | Status: DC
  Administered 2021-12-22: 300 mg via INTRAVENOUS
  Filled 2021-12-22: qty 300

## 2021-12-24 ENCOUNTER — Ambulatory Visit: Payer: 59

## 2021-12-29 ENCOUNTER — Ambulatory Visit
Admission: RE | Admit: 2021-12-29 | Discharge: 2021-12-29 | Disposition: A | Discharge status: Home / Self Care | Payer: 59 | Source: Ambulatory Visit | Attending: Obstetrics and Gynecology | Admitting: Obstetrics and Gynecology

## 2021-12-29 DIAGNOSIS — D509 Iron deficiency anemia, unspecified: Secondary | ICD-10-CM | POA: Diagnosis present

## 2021-12-29 MED ORDER — SODIUM CHLORIDE 0.9 % IV SOLN
300.0000 mg | Freq: Once | INTRAVENOUS | Status: AC
  Administered 2021-12-29: 300 mg via INTRAVENOUS
  Filled 2021-12-29: qty 300

## 2022-01-04 NOTE — H&P (Signed)
Ms. Shevlin is a 46 y.o. female here for [facility] and Bilateral salpingectomy   .Pt with a long h/o of menorrhagia s/p thrombotic CVA and was on warfarin . Now off and on ASA daily . I placed a mirena IUD 9/22 and follow up 11/22 showed IUD strings in place.  Current bleeding pattern is 2-3 weeks  per month  G3P2 SVD  Ferritin =4, now s/p venofer infusion x 2  Patient has c/o of leakage of urine with cough sneeze .    Embx 6/22- neg    U/s at [facility] 6/23 :  FINDINGS:  Uterus   Measurements: 9.4 cm x 5.6 cm x 8.1 cm = volume: 221.0 mL. A 1.5 cm  x 1.1 cm x 1.2 cm hypoechoic posterior uterine mass is seen.   Endometrium   Thickness: 5.5 mm. No IUD is visualized within the endometrial  canal.   Right ovary   Measurements: 2.8 cm x 1.8 cm x 2.4 cm = volume: 6.3 mL. Normal  appearance/no adnexal mass.   Left ovary   Measurements: 3.3 cm x 1.7 cm x 2.0 cm = volume: 6.0 mL. Normal  appearance/no adnexal mass.   Pulsed Doppler evaluation of both ovaries demonstrates normal  low-resistance arterial and venous waveforms.   Other findings   No abnormal free fluid.     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  Past Surgical History:  has no past surgical history on file. Family History: family history is not on file. Social History:  reports that she has never smoked. She has never used smokeless tobacco. She reports that she does not currently use alcohol. OB/GYN History:  OB History       Gravida  3   Para  2   Term      Preterm      AB  1   Living  2        SAB      IAB      Ectopic      Molar      Multiple      Live Births  2             Allergies: has No Known Allergies. Medications:   Current Outpatient Medications:    aspirin 81 MG EC tablet, Take 81 mg by mouth once daily, Disp: ,  Rfl:    atorvastatin (LIPITOR) 40 MG tablet, Take 40 mg by mouth once daily, Disp: , Rfl:    levETIRAcetam (KEPPRA) 500 MG tablet, Take 500 mg by mouth 2 (two) times daily, Disp: , Rfl:    ferrous sulfate 325 (65 FE) MG tablet, Take 325 mg by mouth daily with breakfast (Patient not taking: Reported on 12/08/2021), Disp: , Rfl:    levonorgestreL (MIRENA 52 MG) 20 mcg/24 hr (8 years) IUD, Insert 1 each into the uterus once Placed 03/01/21 (Patient not taking: Reported on 12/16/2021), Disp: , Rfl:    Review of Systems: General:                      No fatigue or weight loss Eyes:                           No vision changes Ears:  No hearing difficulty Respiratory:                No cough or shortness of breath Pulmonary:                  No asthma or shortness of breath Cardiovascular:           No chest pain, palpitations, dyspnea on exertion Gastrointestinal:          No abdominal bloating, chronic diarrhea, constipations, masses, pain or hematochezia Genitourinary:             No hematuria, dysuria, abnormal vaginal discharge, pelvic pain, +Menometrorrhagia Lymphatic:                   No swollen lymph nodes Musculoskeletal:         No muscle weakness Neurologic:                  No extremity weakness, syncope, seizure disorder Psychiatric:                  No history of depression, delusions or suicidal/homicidal ideation      Exam:       Vitals:    01/05/22 1415  BP: 114/71  Pulse: 98      Body mass index is 26.07 kg/m.   WDWN  female in NAD   Lungs: CTA  CV : RRR without murmur   Breast: exam done in sitting and lying position : No dimpling or retraction, no dominant mass, no spontaneous discharge, no axillary adenopathy Neck:  no thyromegaly Abdomen: soft , no mass, normal active bowel sounds,  non-tender, no rebound tenderness Pelvic: tanner stage 5 ,  External genitalia: vulva /labia no lesions Urethra: no prolapse Vagina: normal physiologic d/c,  adequate room for TVH  Cervix: no strings  no lesions, no cervical motion tenderness   Uterus:10 weeks  normal size shape and contour, non-tender Adnexa: no mass,  non-tender    Impression:    Diagnoses of Menorrhagia with irregular cycle and Low ferritin level were also pertinent to this visit.       Plan:    After a thorough discussion with the patient and in person translators we have decided on a TVH and bilateral salpingectomy .       Benefits and risks to surgery: The proposed benefit of the surgery has been discussed with the patient. The possible risks include, but are not limited to: organ injury to the bowel , bladder, ureters, and major blood vessels and nerves. There is a possibility of additional surgeries resulting from these injuries. There is also the risk of blood transfusion and the need to receive blood products during or after the procedure which may rarely lead to HIV or Hepatitis C infection. There is a risk of developing a deep venous thrombosis or a pulmonary embolism . There is the possibility of wound infection and also anesthetic complications, even the rare possibility of death. The patient understands these risks and wishes to proceed. All questions have been answered and the consent has been signed.          No follow-ups on file.   Caroline Sauger, MD

## 2022-01-11 ENCOUNTER — Encounter
Admission: RE | Admit: 2022-01-11 | Discharge: 2022-01-11 | Disposition: A | Discharge status: Home / Self Care | Payer: 59 | Source: Ambulatory Visit | Attending: Obstetrics and Gynecology | Admitting: Obstetrics and Gynecology

## 2022-01-11 VITALS — Ht 60.0 in | Wt 138.0 lb

## 2022-01-11 DIAGNOSIS — Z01812 Encounter for preprocedural laboratory examination: Secondary | ICD-10-CM

## 2022-01-11 HISTORY — DX: Anemia, unspecified: D64.9

## 2022-01-11 NOTE — Patient Instructions (Addendum)
Your procedure is scheduled on: Thursday January 20, 2022. Su procedimiento est programado para: Jueves 3 de Agosto del 2023. Report to Day Surgery inside [place] 2nd floor, stop by admissions desk before getting on elevator.  Presntese a: Science writer del [facility] 2do piso registrese primero en el primer mesabanco antes de subir al elevador. To find out your arrival time please call [phone] between 1PM - 3PM on Wednesday January 19, 2022. Para saber su hora de llegada por favor llame al [phone] entre la 1PM - 3PM el da: [place] 2 de Agosto del 2023.  Remember: Instructions that are not followed completely may result in serious medical risk, up to and including death,  or upon the discretion of your surgeon and anesthesiologist your surgery may need to be rescheduled.  Recuerde: Las instrucciones que no se siguen completamente Heritage manager en un riesgo de salud grave, incluyendo hasta  la [place] o a discrecin de su cirujano y Environmental health practitioner, su ciruga se puede posponer.   __X_ 1.Do not eat food after midnight the night before your procedure. No    gum chewing or hard candies. You may drink clear liquids up to 2 hours     before you are scheduled to arrive for your surgery- DO not drink clear     Liquids within 2 hours of the start of your surgery.     Clear Liquids include:    water, apple juice without pulp, clear carbohydrate drink such as    Clearfast of Gartorade, Black Coffee or Tea (Do not add anything to coffee or tea).      No coma nada despus de la medianoche de la noche anterior a su    procedimiento. No coma chicles ni caramelos duros. Puede tomar    lquidos claros hasta 2 horas antes de su hora programada de llegada al     hospital para su procedimiento. No tome lquidos claros durante el     transcurso de las 2 horas de su llegada programada al hospital para su     procedimiento, ya que esto puede llevar a que su procedimiento se     retrase o tenga que volver a Health and safety inspector.  Los lquidos claros incluyen:          - Agua o jugo de [place] sin pulpa          - Bebidas claras con carbohidratos como ClearFast o Gatorade          - Caf negro o t claro (sin leche, sin cremas, no agregue nada al caf ni al t)  No tome nada que no est en esta lista.  Los pacientes con diabetes tipo 1 y tipo 2 solo deben Agricultural engineer.  Llame a la clnica de PreCare o a la unidad de Same Day Surgery si  tiene alguna pregunta sobre estas instrucciones.   _X__ 2. Complete the "Ensure Clear Pre-surgery Clear Carbohydrate Drink" provided to you, 2 hours before arrival. **If you  are diabetic you will be provided with an alternative drink, Gatorade Zero or G2.  Complete la "Bebida de carbohidratos claros antes de la ciruga Asegrese de que se le proporcione", 2 horas antes de la llegada. **Si es diabtico se le             proporcionar una bebida alternativa, Gatorade Zero o G2.               _X__ 3.Do Not Smoke or use e-cigarettes For 24 Hours Prior  to Your Surgery.    Do not use any chewable tobacco products for at least 6   hours prior to surgery.    No fume ni use cigarrillos electrnicos durante las 24 horas previas    a su [place].  No use ningn producto de tabaco masticable durante   al menos 6 horas antes de la [place].     __X_ 4. No alcohol for 24 hours before or after surgery.    No tome alcohol durante las 24 horas antes ni despus de la [place].   __X__5. On the morning of surgery brush your teeth with toothpaste and water, you                may rinse your mouth with mouthwash if you wish.  Do not swallow any toothpaste of mouthwash.   En la maana de la [place], cepllese los dientes con pasta de dientes y [place],                [place] enjuagarse la boca con enjuague bucal si lo desea. No ingiera ninguna pasta de dientes o enjuague bucal.   __X__ 6. Notify your doctor if there is any change in your medical condition  (cold,fever, infections).    Informe a su mdico si hay algn cambio en su condicin mdica  (resfriado, fiebre, infecciones).   Do not wear jewelry, make-up, hairpins, clips or nail polish.  No use joyas, maquillajes, pinzas/ganchos para el cabello ni esmalte de uas.  Do not wear lotions, powders, or perfumes. You may wear deodorant.  No use lociones, polvos o perfumes.  Puede usar desodorante.    Do not shave 48 hours prior to surgery. Men may shave face and neck.  No se afeite 48 horas antes de la [place].   Do not bring valuables to the hospital.   No lleve objetos [place] is not responsible for any belongings or valuables.  [place] no se hace responsable de ningn tipo de pertenencias u objetos de Geographical information systems officer.               Contacts, dentures or bridgework may not be worn into surgery.  Los lentes de [place], las dentaduras postizas o puentes no se pueden usar en la [place].   Leave your suitcase in the car. After surgery it may be brought to your room.  Deje su maleta en el auto.  Despus de la ciruga podr traerla a su habitacin.   For patients admitted to the hospital, discharge time is determined by your  treatment team.  Para los pacientes que sean ingresados al hospital, el tiempo en el cual se le  dar de alta es determinado por su equipo de [place].   Patients discharged the day of surgery will not be allowed to drive home. A los pacientes que se les da de alta el mismo da de la ciruga no se les permitir conducir a Holiday representative.   __X__ Take these medicines the morning of surgery with A SIP OF WATER:          Tome estas medicinas la maana de la ciruga con UN SORBO DE AGUA:  1. levETIRAcetam (KEPPRA) 1000 MG tablet  2.   3.   4.       5.  6.  ____ Fleet Enema (as directed)          Enema de Fleet (segn lo indicado)    ____ Use CHG Soap as directed  Utilice el jabn de CHG segn lo indicado  ____ Use inhalers on the day of  surgery          Use los inhaladores el da de la ciruga  ____ Stop metformin 2 days prior to surgery          Deje de tomar el metformin 2 das antes de la [place]    __X __ Stop Aspirin 81 mg one week prior to surgery as instructed by your doctor.              Deje de tomar aspirina 81 mg una semana antes de la cirugia como indicado por su doctor.  __X__ one week prior to surgery stop -Anti-inflammatories such as Ibuprofen, Aleve, Advil, Motrin, Naprosyn, Meloxicam, Lodine, Ketoralac, Midol, and aspirin containing products like Excedrin, Goody's and or BC Powders.          Una semana antes de la cirugia - Deje de tomar antiinflamatorios como Ibuprofen, Aleve, Advil, Motrin, Naprosyn, Meloxicam, Lodine, Ketoralac, Midol, o productos con aspirina como Excedrin, Goody's and or BC Powders.   __X__ Stop supplements until after surgery            Deje de tomar suplementos hasta despus de la ciruga  ____ Bring C-Pap to the hospital          [facility]     If you have any questions regarding your pre-procedure instructions,  Please call Pre-admit Testing at [phone]  Si tiene alguna pregunta con respecto a las instrucciones previas al procedimiento, Llame a [place]ue[place] previas a la admisin al [phone]

## 2022-01-12 ENCOUNTER — Encounter: Payer: Self-pay | Admitting: Urgent Care

## 2022-01-12 NOTE — Progress Notes (Signed)
  Perioperative Services Pre-Admission/Anesthesia Testing    Date: 01/12/22  Name: Erica Rosario MRN:   [id number]  Re: Clearance for surgery (neurology)  Planned Surgical Procedure(s):    Case: [id number] Date/Time: 01/20/22 0715   Procedures:      HYSTERECTOMY VAGINAL     BILATERAL SALPINGECTOMY (Bilateral)   Anesthesia type: General   Pre-op diagnosis: menorrhagia   Location: [facility] OR ROOM 05 / [place] ORS FOR ANESTHESIA GROUP   Surgeons: Schermerhorn, Gwen Her, MD   Clinical Notes:  Patient is scheduled for the above procedure on 01/20/2022 with Dr. Laverta Baltimore, MD.  During her PAT interview, patient was found to have a past medical history significant for CVA with resulting seizures.  Patient is followed by neurology.  Reached out to neurology provider for contraindications/recommendations for upcoming surgical intervention.  Per neurology, "I do not believe there are any specific contraindications from our standpoint to proceed.  She does have history of stroke in 2021, but she has been stable from that aspect. She also has seizures, with most recent known seizure event back in March, but no additional (that I am aware of) since that time. Would recommend avoiding certain anesthetic drugs which may increase risk of seizures, but otherwise no other specific recommendations from our standpoint".   Impression and Plan:  Appreciate the input from neurology APP.  Based on clinical review , barring any significant acute changes in the patient's overall condition, it is anticipated that she will be able to proceed with the planned surgical intervention. Any acute changes in clinical condition may necessitate her procedure being postponed and/or cancelled. Patient will meet with anesthesia team (MD and/or CRNA) on this day of her procedure for preoperative evaluation/assessment.   Pre-surgical instructions were reviewed with the patient during her PAT appointment and questions were  fielded by PAT clinical staff. Patient was advised that if any questions or concerns arise prior to her procedure then she should return a call to PAT and/or her surgeon's office to discuss.  Honor Loh, MSN, APRN, FNP-C, [facility]  Peri-operative Services Nurse Practitioner Phone: [phone] 01/12/22 11:10 AM  NOTE: This note has been prepared using Dragon dictation software. Despite my best ability to proofread, there is always the potential that unintentional transcriptional errors may still occur from this process.

## 2022-01-13 ENCOUNTER — Encounter
Admission: RE | Admit: 2022-01-13 | Discharge: 2022-01-13 | Disposition: A | Discharge status: Home / Self Care | Payer: 59 | Source: Ambulatory Visit | Attending: Obstetrics and Gynecology | Admitting: Obstetrics and Gynecology

## 2022-01-13 DIAGNOSIS — Z01818 Encounter for other preprocedural examination: Secondary | ICD-10-CM

## 2022-01-13 DIAGNOSIS — Z01812 Encounter for preprocedural laboratory examination: Secondary | ICD-10-CM | POA: Insufficient documentation

## 2022-01-13 LAB — TYPE AND SCREEN
ABO/RH(D): B POS
Antibody Screen: NEGATIVE

## 2022-01-13 LAB — CBC
HCT: 37.4 % (ref 36.0–46.0)
Hemoglobin: 12.2 g/dL (ref 12.0–15.0)
MCH: 30 pg (ref 26.0–34.0)
MCHC: 32.6 g/dL (ref 30.0–36.0)
MCV: 91.9 fL (ref 80.0–100.0)
Platelets: 249 10*3/uL (ref 150–400)
RBC: 4.07 MIL/uL (ref 3.87–5.11)
RDW: 15.2 % (ref 11.5–15.5)
WBC: 4.2 10*3/uL (ref 4.0–10.5)
nRBC: 0 % (ref 0.0–0.2)

## 2022-01-13 LAB — BASIC METABOLIC PANEL
Anion gap: 8 (ref 5–15)
BUN: 12 mg/dL (ref 6–20)
CO2: 26 mmol/L (ref 22–32)
Calcium: 9.5 mg/dL (ref 8.9–10.3)
Chloride: 108 mmol/L (ref 98–111)
Creatinine, Ser: 0.66 mg/dL (ref 0.44–1.00)
GFR, Estimated: >60 mL/min (ref >60)
Glucose, Bld: 100 mg/dL — ABNORMAL HIGH (ref 70–99)
Potassium: 3.4 mmol/L — ABNORMAL LOW (ref 3.5–5.1)
Sodium: 142 mmol/L (ref 135–145)

## 2022-01-19 MED ORDER — LACTATED RINGERS IV SOLN: INTRAVENOUS | Status: DC

## 2022-01-19 MED ORDER — POVIDONE-IODINE 10 % EX SWAB
2.0000 | Freq: Once | CUTANEOUS | Status: AC
  Administered 2022-01-20: 2 via TOPICAL

## 2022-01-19 MED ORDER — CHLORHEXIDINE GLUCONATE 0.12 % MT SOLN: 15.0000 mL | Freq: Once | OROMUCOSAL | Status: AC

## 2022-01-19 MED ORDER — ACETAMINOPHEN 500 MG PO TABS
1000.0000 mg | ORAL_TABLET | ORAL | Status: AC
Start: 2022-01-20 — End: 2022-01-20

## 2022-01-19 MED ORDER — FAMOTIDINE 20 MG PO TABS: 20.0000 mg | ORAL_TABLET | Freq: Once | ORAL | Status: AC

## 2022-01-19 MED ORDER — CEFAZOLIN SODIUM-DEXTROSE 2-4 GM/100ML-% IV SOLN
2.0000 g | Freq: Once | INTRAVENOUS | Status: AC
  Administered 2022-01-20: 2 g via INTRAVENOUS

## 2022-01-19 MED ORDER — GABAPENTIN 300 MG PO CAPS
300.0000 mg | ORAL_CAPSULE | ORAL | Status: AC
Start: 2022-01-20 — End: 2022-01-20

## 2022-01-19 MED ORDER — ORAL CARE MOUTH RINSE: 15.0000 mL | Freq: Once | OROMUCOSAL | Status: AC

## 2022-01-20 ENCOUNTER — Other Ambulatory Visit: Payer: Self-pay

## 2022-01-20 ENCOUNTER — Ambulatory Visit: Payer: 59 | Admitting: Urgent Care

## 2022-01-20 ENCOUNTER — Ambulatory Visit
Admission: RE | Admit: 2022-01-20 | Discharge: 2022-01-20 | Disposition: A | Discharge status: Home / Self Care | Payer: 59 | Attending: Obstetrics and Gynecology | Admitting: Obstetrics and Gynecology

## 2022-01-20 ENCOUNTER — Encounter
Admission: RE | Disposition: A | Discharge status: Home / Self Care | Payer: Self-pay | Source: Home / Self Care | Attending: Obstetrics and Gynecology

## 2022-01-20 ENCOUNTER — Encounter: Payer: Self-pay | Admitting: Obstetrics and Gynecology

## 2022-01-20 DIAGNOSIS — Z8673 Personal history of transient ischemic attack (TIA), and cerebral infarction without residual deficits: Secondary | ICD-10-CM | POA: Diagnosis not present

## 2022-01-20 DIAGNOSIS — Z01812 Encounter for preprocedural laboratory examination: Secondary | ICD-10-CM

## 2022-01-20 DIAGNOSIS — D259 Leiomyoma of uterus, unspecified: Secondary | ICD-10-CM | POA: Insufficient documentation

## 2022-01-20 DIAGNOSIS — N8003 Adenomyosis of the uterus: Secondary | ICD-10-CM | POA: Diagnosis not present

## 2022-01-20 DIAGNOSIS — Z01818 Encounter for other preprocedural examination: Secondary | ICD-10-CM

## 2022-01-20 DIAGNOSIS — N921 Excessive and frequent menstruation with irregular cycle: Secondary | ICD-10-CM | POA: Insufficient documentation

## 2022-01-20 DIAGNOSIS — D649 Anemia, unspecified: Secondary | ICD-10-CM | POA: Insufficient documentation

## 2022-01-20 DIAGNOSIS — R569 Unspecified convulsions: Secondary | ICD-10-CM | POA: Diagnosis not present

## 2022-01-20 DIAGNOSIS — Z7982 Long term (current) use of aspirin: Secondary | ICD-10-CM | POA: Diagnosis not present

## 2022-01-20 HISTORY — PX: VAGINAL HYSTERECTOMY: SHX2639

## 2022-01-20 HISTORY — DX: Unspecified convulsions: R56.9

## 2022-01-20 LAB — POCT PREGNANCY, URINE: Preg Test, Ur: NEGATIVE

## 2022-01-20 SURGERY — HYSTERECTOMY, VAGINAL
Anesthesia: General | Site: Vagina

## 2022-01-20 MED ORDER — LACTATED RINGERS IV SOLN: INTRAVENOUS | Status: DC

## 2022-01-20 MED ORDER — EPHEDRINE SULFATE (PRESSORS) 50 MG/ML IJ SOLN
INTRAMUSCULAR | Status: DC | PRN
  Administered 2022-01-20 (×2): 10 mg via INTRAVENOUS

## 2022-01-20 MED ORDER — CHLORHEXIDINE GLUCONATE 0.12 % MT SOLN
OROMUCOSAL | Status: AC
  Administered 2022-01-20: 15 mL via OROMUCOSAL
  Filled 2022-01-20: qty 15

## 2022-01-20 MED ORDER — FENTANYL CITRATE (PF) 100 MCG/2ML IJ SOLN
INTRAMUSCULAR | Status: DC | PRN
  Administered 2022-01-20 (×3): 25 ug via INTRAVENOUS

## 2022-01-20 MED ORDER — FAMOTIDINE 20 MG PO TABS
ORAL_TABLET | ORAL | Status: AC
  Administered 2022-01-20: 20 mg via ORAL
  Filled 2022-01-20: qty 1

## 2022-01-20 MED ORDER — KETOROLAC TROMETHAMINE 30 MG/ML IJ SOLN
INTRAMUSCULAR | Status: DC | PRN
  Administered 2022-01-20: 30 mg via INTRAVENOUS

## 2022-01-20 MED ORDER — ROCURONIUM BROMIDE 10 MG/ML (PF) SYRINGE
PREFILLED_SYRINGE | INTRAVENOUS | Status: AC
  Filled 2022-01-20: qty 10

## 2022-01-20 MED ORDER — MIDAZOLAM HCL 2 MG/2ML IJ SOLN
INTRAMUSCULAR | Status: AC
  Filled 2022-01-20: qty 2

## 2022-01-20 MED ORDER — MIDAZOLAM HCL 2 MG/2ML IJ SOLN
INTRAMUSCULAR | Status: DC | PRN
  Administered 2022-01-20: 2 mg via INTRAVENOUS

## 2022-01-20 MED ORDER — 0.9 % SODIUM CHLORIDE (POUR BTL) OPTIME
TOPICAL | Status: DC | PRN
  Administered 2022-01-20: 500 mL

## 2022-01-20 MED ORDER — DEXAMETHASONE SODIUM PHOSPHATE 10 MG/ML IJ SOLN
INTRAMUSCULAR | Status: AC
  Filled 2022-01-20: qty 1

## 2022-01-20 MED ORDER — SUGAMMADEX SODIUM 200 MG/2ML IV SOLN
INTRAVENOUS | Status: DC | PRN
  Administered 2022-01-20: 120 mg via INTRAVENOUS

## 2022-01-20 MED ORDER — LIDOCAINE HCL (PF) 2 % IJ SOLN
INTRAMUSCULAR | Status: AC
  Filled 2022-01-20: qty 5

## 2022-01-20 MED ORDER — LIDOCAINE-EPINEPHRINE 1 %-1:100000 IJ SOLN
INTRAMUSCULAR | Status: AC
  Filled 2022-01-20: qty 1

## 2022-01-20 MED ORDER — KETOROLAC TROMETHAMINE 30 MG/ML IJ SOLN
INTRAMUSCULAR | Status: AC
  Filled 2022-01-20: qty 1

## 2022-01-20 MED ORDER — DEXAMETHASONE SODIUM PHOSPHATE 10 MG/ML IJ SOLN
INTRAMUSCULAR | Status: DC | PRN
  Administered 2022-01-20: 8 mg via INTRAVENOUS

## 2022-01-20 MED ORDER — PROPOFOL 10 MG/ML IV BOLUS
INTRAVENOUS | Status: DC | PRN
  Administered 2022-01-20: 100 mg via INTRAVENOUS

## 2022-01-20 MED ORDER — FENTANYL CITRATE (PF) 100 MCG/2ML IJ SOLN
INTRAMUSCULAR | Status: AC
  Filled 2022-01-20: qty 2

## 2022-01-20 MED ORDER — ONDANSETRON HCL 4 MG/2ML IJ SOLN
INTRAMUSCULAR | Status: DC | PRN
  Administered 2022-01-20: 4 mg via INTRAVENOUS

## 2022-01-20 MED ORDER — CEFAZOLIN SODIUM-DEXTROSE 2-4 GM/100ML-% IV SOLN
INTRAVENOUS | Status: AC
  Filled 2022-01-20: qty 100

## 2022-01-20 MED ORDER — GABAPENTIN 300 MG PO CAPS
ORAL_CAPSULE | ORAL | Status: AC
  Administered 2022-01-20: 300 mg via ORAL
  Filled 2022-01-20: qty 1

## 2022-01-20 MED ORDER — ONDANSETRON 4 MG PO TBDP: 4.0000 mg | ORAL_TABLET | Freq: Four times a day (QID) | ORAL | Status: DC | PRN

## 2022-01-20 MED ORDER — PROPOFOL 10 MG/ML IV BOLUS
INTRAVENOUS | Status: AC
  Filled 2022-01-20: qty 20

## 2022-01-20 MED ORDER — ACETAMINOPHEN 500 MG PO TABS
ORAL_TABLET | ORAL | Status: AC
  Administered 2022-01-20: 1000 mg via ORAL
  Filled 2022-01-20: qty 2

## 2022-01-20 MED ORDER — LIDOCAINE-EPINEPHRINE 1 %-1:100000 IJ SOLN
INTRAMUSCULAR | Status: DC | PRN
  Administered 2022-01-20: 10 mL

## 2022-01-20 MED ORDER — OXYCODONE HCL 5 MG PO TABS: 5.0000 mg | ORAL_TABLET | ORAL | Status: DC | PRN

## 2022-01-20 MED ORDER — LIDOCAINE HCL (CARDIAC) PF 100 MG/5ML IV SOSY
PREFILLED_SYRINGE | INTRAVENOUS | Status: DC | PRN
  Administered 2022-01-20: 60 mg via INTRAVENOUS

## 2022-01-20 SURGICAL SUPPLY — 38 items
BAG URINE DRAIN 2000ML AR STRL (UROLOGICAL SUPPLIES) IMPLANT
CATH FOLEY 2WAY  5CC 16FR (CATHETERS)
CATH FOLEY 2WAY 5CC 16FR (CATHETERS)
CATH ROBINSON RED A/P 16FR (CATHETERS) ×2 IMPLANT
CATH URTH 16FR FL 2W BLN LF (CATHETERS) IMPLANT
DRAPE PERI LITHO V/GYN (MISCELLANEOUS) ×2 IMPLANT
DRAPE SURG 17X11 SM STRL (DRAPES) ×2 IMPLANT
DRAPE UNDER BUTTOCK W/FLU (DRAPES) ×2 IMPLANT
ELECT REM PT RETURN 9FT ADLT (ELECTROSURGICAL) ×2
ELECTRODE REM PT RTRN 9FT ADLT (ELECTROSURGICAL) ×1 IMPLANT
GAUZE 4X4 16PLY ~~LOC~~+RFID DBL (SPONGE) ×4 IMPLANT
GLOVE SURG SYN 8.0 (GLOVE) ×2 IMPLANT
GLOVE SURG SYN 8.0 PF PI (GLOVE) ×1 IMPLANT
GOWN STRL REUS W/ TWL LRG LVL3 (GOWN DISPOSABLE) ×3 IMPLANT
GOWN STRL REUS W/ TWL XL LVL3 (GOWN DISPOSABLE) ×1 IMPLANT
GOWN STRL REUS W/TWL LRG LVL3 (GOWN DISPOSABLE) ×6
GOWN STRL REUS W/TWL XL LVL3 (GOWN DISPOSABLE) ×2
KIT TURNOVER CYSTO (KITS) ×2 IMPLANT
LABEL OR SOLS (LABEL) ×2 IMPLANT
MANIFOLD NEPTUNE II (INSTRUMENTS) ×2 IMPLANT
NEEDLE HYPO 22GX1.5 SAFETY (NEEDLE) ×2 IMPLANT
PACK BASIN MINOR ARMC (MISCELLANEOUS) ×2 IMPLANT
PAD OB MATERNITY 4.3X12.25 (PERSONAL CARE ITEMS) ×2 IMPLANT
PAD PREP 24X41 OB/GYN DISP (PERSONAL CARE ITEMS) ×2 IMPLANT
SCRUB CHG 4% DYNA-HEX 4OZ (MISCELLANEOUS) ×2 IMPLANT
SOL SCRUB PVP POV-IOD 4OZ 7.5% (MISCELLANEOUS) ×2
SOLUTION SCRB POV-IOD 4OZ 7.5% (MISCELLANEOUS) ×1 IMPLANT
SURGILUBE 2OZ TUBE FLIPTOP (MISCELLANEOUS) ×2 IMPLANT
SUT PDS 2-0 27IN (SUTURE) IMPLANT
SUT VIC AB 0 CT1 27 (SUTURE) ×4
SUT VIC AB 0 CT1 27XCR 8 STRN (SUTURE) ×2 IMPLANT
SUT VIC AB 0 CT1 36 (SUTURE) ×2 IMPLANT
SUT VIC AB 2-0 SH 27 (SUTURE) ×2
SUT VIC AB 2-0 SH 27XBRD (SUTURE) ×1 IMPLANT
SYR 10ML LL (SYRINGE) ×2 IMPLANT
SYR CONTROL 10ML LL (SYRINGE) ×2 IMPLANT
WATER STERILE IRR 1000ML POUR (IV SOLUTION) ×2 IMPLANT
WATER STERILE IRR 500ML POUR (IV SOLUTION) ×2 IMPLANT

## 2022-01-20 NOTE — Progress Notes (Signed)
Pt here for [facility] and BS for menorrhagia  and iron deficient anemia . LAbs reviewed Neg HCG .  All question answered with spanish speaking RN . Procedure reviewed . Proceed

## 2022-01-20 NOTE — Discharge Instructions (Addendum)
CIRUGIA AMBULATORIA       Instruccionnes de alta    Date (Fecha)    1.  Las drogas que se Statistician en su cuerpo [employer], asi que por las proximas 24 horas usted no debe:   Conducir Scientist, research (medical)) un automovil   Hacer ninguna decision legal   Tomar ninguna bebida alcoholica  2.  A) Manana puede comenzar una dieta regular.  Es mejor que hoy empiece con liquidos y gradualmente anada comidas solidas.       B) Puede comer cualquier comida que desee pero es mejor empezar con liquidos, luego sopitas con galletas saladas y gradualmente llegar a las comidas solidas.  3.  Por favor avise a su medico inmediatamente si usted tiene algun sangrado anormal, tiene dificultad con la respiracion, enrojecimiento y Social research officer, government en el sitio de la cirugia, [place], fiebro o dolor que se alivia con [place].  4.  Istrucciones especificas : le dieron Garment/textile technologist similar al Ibuprofen hoy a las 9:00 am. Puede tomar Ibuprofen despues de las 5:00 pm, por si lo necesita para Conservation officer, historic buildings.

## 2022-01-20 NOTE — Brief Op Note (Signed)
01/20/2022  8:59 AM  PATIENT:  Erica Rosario  46 y.o. female  PRE-OPERATIVE DIAGNOSIS:  menorrhagia  POST-OPERATIVE DIAGNOSIS:  menorrhagia  PROCEDURE:  Procedure(s): HYSTERECTOMY VAGINAL WITH BILATERAL SALPINGECTOMY (N/A)  SURGEON:  Surgeon(s) and Role:    * Delta Deshmukh, Gwen Her, MD - Primary    * Benjaman Kindler, MD - Assisting  PHYSICIAN ASSISTANT: CSt  ASSISTANTS: none   ANESTHESIA:   general  EBL:  20 mL   BLOOD ADMINISTERED:none  DRAINS: none   LOCAL MEDICATIONS USED:  LIDOCAINE   SPECIMEN:  Source of Specimen:  cervix , uterus and bilateral fallopian tubes   DISPOSITION OF SPECIMEN:  PATHOLOGY  COUNTS:  YES  TOURNIQUET:  * No tourniquets in log *  DICTATION: .Other Dictation: Dictation Number verbal  PLAN OF CARE: Discharge to home after PACU  PATIENT DISPOSITION:  PACU - hemodynamically stable.   Delay start of Pharmacological VTE agent (>24hrs) due to surgical blood loss or risk of bleeding: not applicable

## 2022-01-20 NOTE — Anesthesia Postprocedure Evaluation (Signed)
Anesthesia Post Note  Patient: Erica Rosario  Procedure(s) Performed: HYSTERECTOMY VAGINAL WITH BILATERAL SALPINGECTOMY (Vagina )  Patient location during evaluation: PACU Anesthesia Type: General Level of consciousness: awake and awake and alert Pain management: satisfactory to patient Vital Signs Assessment: post-procedure vital signs reviewed and stable Respiratory status: spontaneous breathing and nonlabored ventilation Cardiovascular status: stable Anesthetic complications: no   No notable events documented.   Last Vitals:  Vitals:   01/20/22 1013 01/20/22 1014  BP: (!) 96/37 101/62  Pulse: (!) 56 (!) 54  Resp: 18   Temp: (!) 36.1 C   SpO2: 100% 100%    Last Pain:  Vitals:   01/20/22 1013  TempSrc: Temporal  PainSc: 0-No pain                 VAN STAVEREN,Azora Bonzo

## 2022-01-20 NOTE — Op Note (Signed)
Erica Rosario, Erica Rosario MEDICAL RECORD NO: [id number] ACCOUNT NO: [id number] DATE OF BIRTH: Dec 10, 1975 FACILITY: [facility] LOCATION: [facility] PHYSICIAN: Boykin Nearing, MD  Operative Report   PREOPERATIVE DIAGNOSIS:  Menorrhagia.  POSTOPERATIVE DIAGNOSIS:  Menorrhagia.  PROCEDURE:  Total vaginal hysterectomy, bilateral salpingectomy.  SURGEON:  Boykin Nearing, MD  FIRST ASSISTANT:  Benjaman Kindler, MD  ANESTHESIA:  General endotracheal anesthesia.  INDICATIONS:  This is a 46 year old gravida 3, para 2, patient with a long history of menorrhagia.  The patient has failed conservative therapy and wishes definitive surgery.  DESCRIPTION OF PROCEDURE:  After adequate general endotracheal anesthesia, the patient was placed in dorsal supine position with the legs in the candy cane stirrups.  The patient's lower abdomen, perineum and vagina were prepped and draped in normal  sterile fashion.  A timeout was performed.  A straight catheterization of the bladder yielded 125 mL clear urine.  Weighted speculum was placed in the posterior vaginal vault and the cervix was grasped with 2 thyroid tenacula.  Cervix was then  circumferentially injected with 1% lidocaine with 1:100,000 epinephrine.  A direct posterior colpotomy incision was made and upon entry into the posterior cul-de-sac, a long billed speculum was placed.  Uterosacral ligaments were then bilaterally  clamped, transected and suture ligated with 0 Vicryl suture tagged for later identification.  Anterior cervix was circumferentially incised with the Bovie.  Anterior cul-de-sac was entered sharply.  The cardinal ligaments and the uterosacral ligaments  were bilaterally clamped, transected and suture ligated with 0 Vicryl suture.  Sequential clamping ensued until the cornua were then bilaterally clamped, transected the cervix and uterus was delivered and each pedicle was doubly ligated with 0 Vicryl  suture.  Two additional  figure-of-eight sutures were required for lateral hemostasis.  Each fallopian tube was grasped at the distal portion and clamped and the distal portion of each fallopian tube was removed and the pedicle was ligated with 0 Vicryl  suture.  Good hemostasis was noted.  Ovaries appeared normal.  The peritoneum was closed with a pursestring 2-0 PDS suture and the vaginal vault was then closed with a running 0 Vicryl suture in a vertical fashion with good hemostasis.  Uterosacral  ligaments were plicated centrally and the rest of the vault was closed with the 0 Vicryl suture.  Good hemostasis was noted.  Straight catheterization of the bladder yielded additional 25 mL clear urine.  There were no complications.  ESTIMATED BLOOD LOSS:  20 mL.  INTRAOPERATIVE FLUIDS:  800 mL.  URINE OUTPUT:  150 mL  DISPOSITION:  The patient was taken to recovery room in good condition.  Of note, the patient did receive 2 grams IV Ancef for surgical prophylaxis and 30 mg Toradol at the end of the procedure.   NIK D: 01/20/2022 9:51:57 am T: 01/20/2022 10:12:00 am  JOB: [id number]/ [id number]

## 2022-01-20 NOTE — Transfer of Care (Signed)
Immediate Anesthesia Transfer of Care Note  Patient: Erica Rosario  Procedure(s) Performed: HYSTERECTOMY VAGINAL WITH BILATERAL SALPINGECTOMY (Vagina )  Patient Location: PACU  Anesthesia Type:General  Level of Consciousness: drowsy  Airway & Oxygen Therapy: Patient Spontanous Breathing and Patient connected to face mask oxygen  Post-op Assessment: Report given to RN and Post -op Vital signs reviewed and stable  Post vital signs: Reviewed and stable  Last Vitals:  Vitals Value Taken Time  BP 99/46 01/20/22 0907  Temp    Pulse 60 01/20/22 0913  Resp 13 01/20/22 0913  SpO2 100 % 01/20/22 0913  Vitals shown include unvalidated device data.  Last Pain:  Vitals:   01/20/22 4665  TempSrc: Temporal  PainSc: 0-No pain         Complications: No notable events documented.

## 2022-01-20 NOTE — Anesthesia Preprocedure Evaluation (Signed)
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 negative neurological ROS  negative psych ROS   GI/Hepatic negative GI ROS, Neg liver ROS,   Endo/Other  negative endocrine ROS  Renal/GU negative Renal ROS  negative genitourinary   Musculoskeletal   Abdominal Normal abdominal exam  (+)   Peds negative pediatric ROS (+)  Hematology negative hematology ROS (+) Blood dyscrasia, anemia ,   Anesthesia Other Findings Past Medical History: No date: Anemia 03/25/2020: Hemorrhagic cerebrovascular accident (CVA) ([place]) No date: Seizures ([place]) 03/25/2020: Thrombosis of superior sagittal sinus     Comment:  a.) thrombosis of the posterior aspect of the superior               sagittal sinus with hemorrhagic transformation of a               venous infarct in the LEFT occipital lobe; (+) vasogenic               edema with regional mass effect resulting in LEFT to               RIGHT midline shift  History reviewed. No pertinent surgical history.  BMI    Body Mass Index: 26.95 kg/m      Reproductive/Obstetrics negative OB ROS                             Anesthesia Physical Anesthesia Plan  ASA: 2  Anesthesia Plan: General   Post-op Pain Management:    Induction: Intravenous  PONV Risk Score and Plan: 1 and Ondansetron and Dexamethasone  Airway Management Planned: Oral  ETT  Additional Equipment:   Intra-op Plan:   Post-operative Plan: Extubation in OR  Informed Consent: I have reviewed the patients History and Physical, chart, labs and discussed the procedure including the risks, benefits and alternatives for the proposed anesthesia with the patient or authorized representative who has indicated his/her understanding and acceptance.     Dental Advisory Given  Plan Discussed with: CRNA and Surgeon  Anesthesia Plan Comments:         Anesthesia Quick Evaluation

## 2022-01-20 NOTE — Anesthesia Procedure Notes (Signed)
Procedure Name: Intubation Date/Time: 01/20/2022 7:39 AM  Performed by: Jerrye Noble, CRNAPre-anesthesia Checklist: Patient identified, Emergency Drugs available, Suction available and Patient being monitored Patient Re-evaluated:Patient Re-evaluated prior to induction Oxygen Delivery Method: Circle system utilized Preoxygenation: Pre-oxygenation with 100% oxygen Induction Type: IV induction Ventilation: Mask ventilation without difficulty Laryngoscope Size: McGraph and 3 Grade View: Grade I Tube type: Oral Tube size: 6.5 mm Number of attempts: 1 Airway Equipment and Method: Stylet and Video-laryngoscopy Placement Confirmation: ETT inserted through vocal cords under direct vision, positive ETCO2 and breath sounds checked- equal and bilateral Secured at: 21 cm Tube secured with: Tape Dental Injury: Teeth and Oropharynx as per pre-operative assessment

## 2022-01-21 ENCOUNTER — Encounter: Payer: Self-pay | Admitting: Obstetrics and Gynecology

## 2022-01-24 ENCOUNTER — Ambulatory Visit: Payer: 59 | Admitting: Adult Health

## 2022-01-24 LAB — SURGICAL PATHOLOGY

## 2022-02-03 ENCOUNTER — Other Ambulatory Visit: Payer: Self-pay | Admitting: *Deleted

## 2022-02-03 MED ORDER — ASPIRIN 81 MG PO TBEC: 81.0000 mg | DELAYED_RELEASE_TABLET | Freq: Every day | ORAL | 0 refills | Status: AC

## 2022-02-28 NOTE — Progress Notes (Unsigned)
[facility] [address] [place] [place] [address] [phone]       OFFICE FOLLOW UP NOTE  [name] Date of Birth:  Oct 24, 1975 Medical Record Number:  [id number]   Reason for visit: Recent seizure    SUBJECTIVE:   CHIEF COMPLAINT:  No chief complaint on file.    HPI:   Update 03/01/2022 JM: Patient returns for 48-monthstroke and seizure follow-up.  She is accompanied by ***.  Overall stable without any reoccurring seizure activity or new stroke/TIA symptoms.  Compliant on Keppra 1000/1500, denies side effects.  Compliant on aspirin atorvastatin, denies side effects.  Blood pressure today ***.         History provided for reference purposes only Update 09/14/2021 JM: Patient returns for acute visit due to recent seizure activity.  She is accompanied by her sister and [facility] Incinterpreter.  [facility] for a witnessed generalized tonic-clonic seizure lasting approximately 5 minutes.  She was confused and postictal upon arrival.  She had additional tonic-clonic seizure in ED lasting approximately 1 to 2 minutes, received IV Ativan and Keppra load.  Lab work largely within normal limits and CTH no acute abnormality.  Due to likely unprovoked seizure, Keppra dosage increased to 1500 mg twice daily.  Since discharge, she has been doing well without any reoccurring seizure activity.  Remains on Keppra 1500 mg twice daily but does complain on dizziness and nauseous since starting which was a prior compliant on higher keppra dosage. Prior to seizure onset, denies any missed dosages, significantly elevated stress levels, recent medication changes or any potential seizure provoking triggers.   Update 07/26/2021 JM: Returns for follow-up visit after prior visit 4 months ago with Dr. SLeonie Manfor recent breakthrough seizure and hx of stroke. She is accompanied by a friend and CAmherstinterpreter.  Overall stable since prior  visit without any additional seizure activity, compliant on Keppra 500/'1000mg'$  without side effects (prior difficulty tolerating '1000mg'$  BID dosing which improved after dose adjustment). EEG 04/2021 unremarkable without evidence of seizures.  Stable from stroke standpoint without new stroke/TIA symptoms and stable residual deficit of right peripheral visual impairment.  Compliant on aspirin and atorvastatin without side effects.  Blood pressure today 106/72. At prior visit, topamax discontinued as no headaches but shortly after stopping, expericing reoccurring headaches - PCP restarted Topamax, headaches stable but can occur with increased stress. Recently started on venlafaxine for premenopausal symptoms.  No new concerns at this time.   Update 04/07/2021 Dr. SLeonie ManPatient returns for follow-up after last visit 2 months ago.  She is accompanied by brother and Spanish language interpreter was present throughout this visit.  Patient was seen in the ER on 04/03/2021 with breakthrough seizures.  She presented with visual problems which was similar to what she had during her previous episode of venous sinus thrombosis and subsequently went on to have multiple seizures.  Patient denied missing any recent doses of medicines.  She was loaded with 2 g of Keppra and Ativan 2 mg.  Her maintenance dose of Keppra was increased to 1 g twice daily.  Patient states she does not remember the episode of the hospital visit.  She states she is done well since then and has not had any further recurrent seizures but she complains of feeling dizzy and having nausea and trouble tolerating the higher dose.  She did also have CT scan of the head as well as CT angiogram of the neck  and brain both of which were unremarkable on 04/03/2021.  Patient denies significant headaches or numbness but she is on Topamax 25 mg twice daily and is wondering if she can stop it.  She has no new complaints today.  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  Update 01/18/2021 JM: Ms. Ballester returns for 30-monthstroke follow-up accompanied by sister and [facility].  [facility]iness and occipital and frontal headache associated with bright lights in right eye over the past week.  Seen in ED for similar symptoms on 3/7 and diagnosed with iron deficiency anemia possibly in setting of heavy menstrual cycle.  Started menstrual cycle on 7/23 but symptoms much worse compared to prior symptoms. Dizziness improved after meclizine and received IM Reglan for headache.  CT head unremarkable for acute findings.  Lab work unremarkable.  Symptoms have since resolved.  Is scheduled for IUD implant on 8/8 due to continued heavy irregular menstrual cycles.  Of note, she did have IUD placed 7/12 to help control menstrual cycles with heavy bleeding.  She describes the dizziness as a lightheaded feeling.  She has had vertigo in the past and reports different dizziness symptoms associated with menstrual cycle.  Residual stroke deficit right peripheral visual impairment stable.  Denies any other new stroke/TIA symptoms. Compliant on warfarin with reported heavy menstrual cycle since starting and atorvastatin without associated side effects. Blood pressure today 110/66. Reports complaince on keppra '500mg'$  BID without associated side effects and denies any seizure activity.  No further concerns at this time.  Update 09/09/2020 JM: Mrs. MGuirguisreturns for stroke follow-up after prior visit approximately 4 months ago accompanied by interpreter.   Residual deficits of mild right peripheral visual impairment but is improving.  She denies residual right hand numbness Evaluated in the ED 08/24/2020 with headache, dizziness and visual complaints. MRI and MRV unremarkable for acute findings.  Noted to be anemic with low iron levels which was felt  possibly related to heavy menstrual cycles. Headache and dizziness have since resolved. She has since f/u with OB/GYN with plans on placing IUD to help control menstrual cycles.  Also follow-up with PCP who initiated ferrous sulfate. Has f/u next week for repeat lab work Denies new stroke/TIA symptoms  reports bilateral hand finger joint pain with increased stiffness and quick lasting "tremor" with extension of the fingers upon awakening which has been present over the past month.  Denies any weakness, pain in other locations such as wrist or arm or numbness/tingling  Continues on Keppra 500 mg twice daily -tolerating denies any recent seizure activity  Remains on warfarin with INR levels monitored by PCP- per patient, INR level stable - unable to view via epic - aside from heavy menstrual cycles, no other bleeding or bruising concerns Remains on atorvastatin -denies associated side effects  Blood pressure today 110/67 - does not routinely monitor at home  No further concerns at this time  Initial visit 05/12/2020 JM: Ms. MHollomanis being seen for hospital follow-up accompanied by her brother and interpreter. She reports she has been improving but continues to have right hand numbness, right peripheral visual impairment and gait impairment due to visual difficulty. She has not done any therapies since discharge due to lack of insurance.  She is in the process of applying for [place] financial assistance as well as  other insurance..  Denies new or worsening stroke/TIA symptoms. 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  She lives with her husband and 2 sons able to maintain ADLs independently and has been slowly returning to completing IADLs.  She continues on Keppra 500 mg twice daily tolerating well and denies any reoccurring seizure activity.  She remains on warfarin without bleeding or bruising with INR levels monitored by PCP weekly. Per patient, INR  levels have been greater than 3 so currently doing dosage adjustments per PCP. Remains on atorvastatin without myalgias. Blood pressure today 114/68 - not routinely monitored at home.  She has since discontinued OCP.  No further concerns at this time.  Stroke admission 03/25/2020 JM: Ms. CHRISSY EALEY is a 46 y.o. female with no PMH  who presented to [facility] with HA and neck stiffness on 03/25/2020.   Personally reviewed hospitalization pertinent progress notes, lab work and imaging with summary provided.  Shortly transferred to [facility] and evaluated by Dr. 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  Repeat CT head showed hemorrhagic transformation L parietal with slight increase edema with most hemorrhagic foci unchanged and single new hemorrhage at vertex.  Repeat CT head 3 hours later stable. Pan CT negative for malignancy but did show B renal vein nonocclusive thrombosis. CTV and CTA h/n negative LVO with partial thrombosis of the superior sagittal sinus at the vertex, nonocclusive as well can be demonstrated around the margins of the thrombus with patency beyond that.  Also showed thrombosed superficial draining vein of the left again visible, similar in appearance with low density edema slightly increased and mild swelling with mass-effect and 1 to 2 mm of left-to-right shift and flattening of the left lateral ventricle.  Questionable hypercoagulable state with hypercoagulable showed anticardiolipin IgM 19 (indeterminate) and protein S total and active mildly low but likely inaccurate on heparin.  Advise to discontinue OCP use.  Placed on heparin IV and warfarin daily. GTC in ER s/p Ativan and Keppra load and initiated Keppra 500 mg twice daily with LTM showing left frontal temporal continuous slowing and bilateral parieto-occipital spikes.  LDL UTC with  direct LDL of 151 no statin PTA and initiated atorvastatin 40 mg daily.  No prior history of HTN or DM.  Other stroke risk factors include history of EtOH use and overweight but no prior stroke history.  Other active problems include anemia, leukocytosis and hypokalemia.  Evaluated by therapies and recommended discharge to CIR for ongoing therapy needs.  She was discharged to CIR on 03/31/2020 and discharged home on 04/03/2020 on Keppra, Topamax and warfarin.    She unfortunately returned on 04/04/2020 with complaint of headache, generalized weakness and "faintness" as well as nauseated and several episodes of emesis but no focal deficits noted.  She was found to have worsening vasogenic edema in left parieto-occipital region and worsening midline shift of 13 mm.  Warfarin discontinued and reversed with vitamin K.  Administering mannitolx1, Decadron, hypertonic saline and heparin drip.  Repeat CT head showed improved MLS, cerebral edema and decreased hydrocephalus and again repeat CT head showed stable edema with decreasing IPH and same 1 cm midline shift. NSG on board PRN.  Also found to have severe anemia receiving 2U PRBC with improvement potentially contributing to syncopal event on 10/15 at home.  Elevated liver enzymes of unclear  etiology possibly related to Topamax therefore DC'd.  Restarted warfarin with INR goal 2-3 in addition to Lovenox for total of 4 doses.  Advised to follow-up with [place] on 10/25 for pro time blood test and CBC as well as INR levels and adjustment of warfarin as indicated and possible discontinuation of Lovenox.  Discharged on dexamethasone.  Reevaluated by therapy recommended outpatient OT and no PT needs.  She was discharged home on 04/11/2020.   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 Also thrombosis draining cortical vein. 2 areas enhancement in L occipital hemorrhage possible ongoing hemorrhage. Similar edema w/ slight R shift. CTA and CTV 10/9 - Partial thrombosis of the superior sagittal sinus at the vertex, Thrombosed superficial draining vein on the left, Hemorrhagic infarction in the left parietal cortical and subcortical brain  Pan CT neg for malignancy, B renal vein nonocclusive thrombosis. CTV 10/16 Decreased volume of nonocclusive superior sagittal sinus Thrombus. Decreased conspicuity of thrombus within an adjacent left cortical vein. Repeat CT head improved MLS, cerebral edema and decreased hydrocephalus  2D Echo EF 60-65%. No source of embolus  LE dopplers 10/19 - no DVT  LDL UTC, TC 210, TG 132, direct LDL 151.9 HgbA1c 5.6 UDS +benzos Hypercoagulable labs unremarkable (anticardiolipin IgM slightly elevated, prot S total and activity mildly low - not accurate on heparin) VTE prophylaxis - heparin IV   No antithrombotic prior to admission, now on full dose lovenox. On coumadin with INR goal 2-3.  Today INR 1.5 Decadron '4mg'$  IV q6h ->'2mg'$  IV q 6h -> '2mg'$  po Q6h -> '2mg'$  Q8  Therapy recommendations:  Outpt OT, no PT - gave pt info for pro bono therapies at [facility] clinic Disposition:  home  Cerebral Edema CT head Left parieto-occipital venous infarct with increased edema and mass effect including 13 mm of rightward midline shift. New mild dilatation of the right lateral ventricle suggesting early trapping Repeat CT improved MLS, cerebral edema and decreased hydrocephalus CT repeat stable edema, decreasing IPH. Same 1cm midline shift  NSG on board PRN S/p mannitol x 1 Decadron '10mg'$  x 1 -> '4mg'$  Q6 -> '2mg'$  Q6 ->'2mg'$  Q8 -> tapering On 3% at 75 -> 40 cc / hr -> 20cc->off Increase topomax to '50mg'$  bid -> d/c PICC 10/18->10/22  removed      ROS:   14 system review of systems performed and negative with exception of those listed in HPI  PMH:  Past Medical History:  Diagnosis Date   Anemia    Hemorrhagic cerebrovascular accident (CVA) ([place]) 03/25/2020   Seizures ([place])    Thrombosis of superior sagittal sinus 03/25/2020   a.) thrombosis of the posterior aspect of the superior sagittal sinus with hemorrhagic transformation of a venous infarct in the LEFT occipital lobe; (+) vasogenic edema with regional mass effect resulting in LEFT to RIGHT midline shift    PSH:  Past Surgical History:  Procedure Laterality Date   VAGINAL HYSTERECTOMY N/A 01/20/2022   Procedure: HYSTERECTOMY VAGINAL WITH BILATERAL SALPINGECTOMY;  Surgeon: Boykin Nearing, MD;  Location: [facility] ORS;  Service: Gynecology;  Laterality: N/A;    Social History:  Social History   Socioeconomic History   Marital status: Married    Spouse name: Not on file  Number of children: Not on file   Years of education: Not on file   Highest education level: Not on file  Occupational History   Not on file  Tobacco Use   Smoking status: Never   Smokeless tobacco: Never  Vaping Use   Vaping Use: Never used  Substance and Sexual Activity   Alcohol use: Not Currently   Drug use: Never   Sexual activity: Not on file  Other Topics Concern   Not on file  Social History Narrative   Not on file   Social Determinants of Health   Financial Resource Strain: Not on file  Food Insecurity: Not on file  Transportation Needs: Not on file  Physical Activity: Not on file  Stress: Not on file  Social Connections: Not on file  Intimate Partner Violence: Not on file    Family History:  Family History  Problem Relation Age of Onset   Breast cancer Neg Hx     Medications:   Current Outpatient Medications on File Prior to Visit  Medication Sig Dispense Refill   acetaminophen (TYLENOL) 500 MG tablet Take 500 mg by mouth every 6 (six) hours as  needed.     aspirin EC 81 MG tablet Take 1 tablet (81 mg total) by mouth daily. Swallow whole. 30 tablet 0   atorvastatin (LIPITOR) 40 MG tablet TAKE 1 TABLET (40 MG TOTAL) BY MOUTH DAILY. (Patient taking differently: Take 40 mg by mouth daily.) 30 tablet 0   levETIRAcetam (KEPPRA) 500 MG tablet Take 2 tablets (1,000 mg total) by mouth every morning AND 3 tablets (1,500 mg total) at bedtime. 150 tablet 5   No current facility-administered medications on file prior to visit.    Allergies:  No Known Allergies    OBJECTIVE:  Physical Exam  There were no vitals filed for this visit.  There is no height or weight on file to calculate BMI. No results found.   General: well developed, well nourished, very pleasant middle-aged Hispanic female, seated, in no evident distress Head: head normocephalic and atraumatic.   Neck: supple with no carotid or supraclavicular bruits Cardiovascular: regular rate and rhythm, no murmurs Musculoskeletal: no deformity Skin:  no rash/petichiae Vascular:  Normal pulses all extremities   Neurologic Exam Mental Status: Awake and fully alert. Limited English primarily Spanish-speaking. Denies dysarthria or aphasia.  Oriented to place and time. Recent and remote memory intact. Attention span, concentration and fund of knowledge appropriate. Mood and affect appropriate.  Cranial Nerves: Pupils equal, briskly reactive to light. Extraocular movements full without nystagmus. Visual fields right homonymous inferior quadrantanopia. Hearing intact. Facial sensation intact. Face, tongue, palate moves normally and symmetrically.  Motor: Normal bulk and tone. Normal strength in all tested extremity muscles. Sensory.: intact to touch , pinprick , position and vibratory sensation.  Coordination: Rapid alternating movements normal in all extremities. Finger-to-nose and heel-to-shin performed accurately bilaterally.  No evidence of action or rest tremors or abnormal hand  movements Gait and Station: Arises from chair without difficulty. Stance is normal. Gait demonstrates normal stride length and balance without use of assistive device.  Able to perform tandem walk and heel toe without difficulty.   Reflexes: 1+ and symmetric. Toes downgoing.       ASSESSMENT: Erica Rosario is a 46 y.o. 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 On 04/04/2020 (d/c'd home from [place] on 10/15 in stable condition) with worsening headache, N/V and generalized weakness found to have worsening vasogenic edema in left parieto-occipital region and worsening midline shift of 13 mm requiring reversal of warfarin, mannitol x1, Decadron and hypertonic saline with improvement.  Hospital course complicated by severe anemia, leukocytosis and fever, and elevated liver enzymes (? R/t Topamax).  Recurrent generalized tonic-clonic seizure 09/10/2021.  Vascular risk factors include anemia, HLD and OCP use.     PLAN:  Sagittal sinus thrombosis w/ hemorrhagic transformation:  Residual deficit: Right inferior homonymous quadrantanopia  MRV 08/24/2020 no evidence of residual thrombosis Continue aspirin 81 mg daily and atorvastatin for secondary stroke prevention.  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 and 1500 mg PM (intolerant to higher dosage, seizure occurred on 500 mg AM and '1000mg'$  p.m. dosage) Breakthrough seizure 09/06/2021 -witnessed tonic-clonic seizure lasting 5 minutes;  prior sz 03/1021 EEG 09/2021 unremarkable Initial onset 03/2020.  Most recent seizure 03/2021.   Advised no driving for 6 months per [place] law. Discussed use of cameras in her home and she is by herself during the day or life alert button that can potentially detect any falls from seizures as she is by herself during the day Discussed avoiding seizure provoking triggers and ensuring medication compliance    Will follow up as scheduled in August    [place], [facility]    I spent 34 minutes of face-to-face and non-face-to-face time with patient, sister and interpreter.  This included previsit chart review including review of recent hospitalization, lab review, study review, order entry, electronic health record documentation, patient education and discussion regarding recent seizure activity, further treatment options and evaluation, hx of prior stroke and residual deficits, secondary stroke prevention measures and aggressive stroke risk factor management and answered all other questions to patients satisfaction  Frann Rider, AGNP-BC  [facility] [address] [place], [place] [address]  Phone [phone] Fax [phone] Note: This document was prepared with digital dictation and possible smart phrase technology. Any transcriptional errors that result from this process are unintentional.

## 2022-03-01 ENCOUNTER — Encounter: Payer: Self-pay | Admitting: Adult Health

## 2022-03-01 ENCOUNTER — Ambulatory Visit: Payer: 59 | Admitting: Adult Health

## 2022-03-01 ENCOUNTER — Ambulatory Visit (INDEPENDENT_AMBULATORY_CARE_PROVIDER_SITE_OTHER): Payer: 59 | Admitting: Adult Health

## 2022-03-01 VITALS — BP 110/60 | HR 58 | Ht 60.0 in | Wt 136.8 lb

## 2022-03-01 DIAGNOSIS — G08 Intracranial and intraspinal phlebitis and thrombophlebitis: Secondary | ICD-10-CM | POA: Diagnosis not present

## 2022-03-01 DIAGNOSIS — I639 Cerebral infarction, unspecified: Secondary | ICD-10-CM

## 2022-03-01 DIAGNOSIS — R519 Headache, unspecified: Secondary | ICD-10-CM

## 2022-03-01 DIAGNOSIS — G40909 Epilepsy, unspecified, not intractable, without status epilepticus: Secondary | ICD-10-CM

## 2022-03-01 DIAGNOSIS — D509 Iron deficiency anemia, unspecified: Secondary | ICD-10-CM | POA: Diagnosis not present

## 2022-03-01 MED ORDER — LEVETIRACETAM 500 MG PO TABS: ORAL_TABLET | ORAL | 3 refills | Status: DC

## 2022-03-01 NOTE — Patient Instructions (Addendum)
Continue Keppra 1000 mg a.m. and 1500 mg p.m. - please call with any seizure activity   We will check lab work today to look at iron levels to ensure this is not contributing to your headaches. If these levels look good, we can consider trying a daily headache prevention medication   Please speak with your pharmacist regarding use of evening primrose oil with history of seizures to ensure this is safe to take  Continue aspirin 81 mg daily  and atorvastatin for secondary stroke prevention  Continue to follow up with PCP regarding cholesterol and blood pressure management  Maintain strict control of hypertension with blood pressure goal below 130/90 and cholesterol with LDL cholesterol (bad cholesterol) goal below 70 mg/dL.   Signs of a Stroke? Follow the BEFAST method:  Balance Watch for a sudden loss of balance, trouble with coordination or vertigo Eyes Is there a sudden loss of vision in one or both eyes? Or double vision?  Face: Ask the person to smile. Does one side of the face droop or is it numb?  Arms: Ask the person to raise both arms. Does one arm drift downward? Is there weakness or numbness of a leg? Speech: Ask the person to repeat a simple phrase. Does the speech sound slurred/strange? Is the person confused ? Time: If you observe any of these signs, call 911.      Followup in the future with me in 6 months or call earlier if needed       Thank you for coming to see [place] at [facility]. I hope we have been able to provide you high quality care today.  You may receive a patient satisfaction survey over the next few weeks. We would appreciate your feedback and comments so that we may continue to improve ourselves and the health of our patients.

## 2022-03-02 ENCOUNTER — Emergency Department: Payer: 59

## 2022-03-02 ENCOUNTER — Telehealth: Payer: Self-pay | Admitting: *Deleted

## 2022-03-02 ENCOUNTER — Other Ambulatory Visit: Payer: Self-pay

## 2022-03-02 ENCOUNTER — Emergency Department
Admission: EM | Admit: 2022-03-02 | Discharge: 2022-03-02 | Disposition: A | Discharge status: Home / Self Care | Payer: 59 | Attending: Emergency Medicine | Admitting: Emergency Medicine

## 2022-03-02 DIAGNOSIS — Z87898 Personal history of other specified conditions: Secondary | ICD-10-CM

## 2022-03-02 DIAGNOSIS — R569 Unspecified convulsions: Secondary | ICD-10-CM | POA: Diagnosis not present

## 2022-03-02 DIAGNOSIS — R531 Weakness: Secondary | ICD-10-CM | POA: Diagnosis present

## 2022-03-02 LAB — URINALYSIS, ROUTINE W REFLEX MICROSCOPIC
Bilirubin Urine: NEGATIVE
Glucose, UA: NEGATIVE mg/dL
Hgb urine dipstick: NEGATIVE
Ketones, ur: NEGATIVE mg/dL
Leukocytes,Ua: NEGATIVE
Nitrite: NEGATIVE
Protein, ur: NEGATIVE mg/dL
Specific Gravity, Urine: 1.023 (ref 1.005–1.030)
pH: 7 (ref 5.0–8.0)

## 2022-03-02 LAB — CBC WITH DIFFERENTIAL/PLATELET
Abs Immature Granulocytes: 0.02 10*3/uL (ref 0.00–0.07)
Basophils Absolute: 0 10*3/uL (ref 0.0–0.2)
Basophils Absolute: 0 10*3/uL (ref 0.0–0.1)
Basophils Relative: 1 %
Basos: 1 %
EOS (ABSOLUTE): 0.1 10*3/uL (ref 0.0–0.4)
Eos: 2 %
Eosinophils Absolute: 0.1 10*3/uL (ref 0.0–0.5)
Eosinophils Relative: 1 %
HCT: 37.1 % (ref 36.0–46.0)
Hematocrit: 38.3 % (ref 34.0–46.6)
Hemoglobin: 12.4 g/dL (ref 11.1–15.9)
Hemoglobin: 12.2 g/dL (ref 12.0–15.0)
Immature Grans (Abs): 0 10*3/uL (ref 0.0–0.1)
Immature Granulocytes: 0 %
Immature Granulocytes: 0 %
Lymphocytes Absolute: 1.9 10*3/uL (ref 0.7–3.1)
Lymphocytes Relative: 38 %
Lymphs Abs: 2.6 10*3/uL (ref 0.7–4.0)
Lymphs: 40 %
MCH: 30.2 pg (ref 26.6–33.0)
MCH: 30.2 pg (ref 26.0–34.0)
MCHC: 32.4 g/dL (ref 31.5–35.7)
MCHC: 32.9 g/dL (ref 30.0–36.0)
MCV: 93 fL (ref 79–97)
MCV: 91.8 fL (ref 80.0–100.0)
Monocytes Absolute: 0.4 10*3/uL (ref 0.1–0.9)
Monocytes Absolute: 0.5 10*3/uL (ref 0.1–1.0)
Monocytes Relative: 7 %
Monocytes: 8 %
Neutro Abs: 3.8 10*3/uL (ref 1.7–7.7)
Neutrophils Absolute: 2.4 10*3/uL (ref 1.4–7.0)
Neutrophils Relative %: 53 %
Neutrophils: 49 %
Platelets: 194 10*3/uL (ref 150–450)
Platelets: 209 10*3/uL (ref 150–400)
RBC: 4.11 x10E6/uL (ref 3.77–5.28)
RBC: 4.04 MIL/uL (ref 3.87–5.11)
RDW: 16.8 % — ABNORMAL HIGH (ref 11.7–15.4)
RDW: 16.5 % — ABNORMAL HIGH (ref 11.5–15.5)
WBC: 4.7 10*3/uL (ref 3.4–10.8)
WBC: 7 10*3/uL (ref 4.0–10.5)
nRBC: 0 % (ref 0.0–0.2)

## 2022-03-02 LAB — CBC
HCT: 36.6 % (ref 36.0–46.0)
Hemoglobin: 12.1 g/dL (ref 12.0–15.0)
MCH: 30.2 pg (ref 26.0–34.0)
MCHC: 33.1 g/dL (ref 30.0–36.0)
MCV: 91.3 fL (ref 80.0–100.0)
Platelets: 190 10*3/uL (ref 150–400)
RBC: 4.01 MIL/uL (ref 3.87–5.11)
RDW: 16.5 % — ABNORMAL HIGH (ref 11.5–15.5)
WBC: 7 10*3/uL (ref 4.0–10.5)
nRBC: 0 % (ref 0.0–0.2)

## 2022-03-02 LAB — BASIC METABOLIC PANEL
Anion gap: 10 (ref 5–15)
BUN: 13 mg/dL (ref 6–20)
CO2: 23 mmol/L (ref 22–32)
Calcium: 9.2 mg/dL (ref 8.9–10.3)
Chloride: 107 mmol/L (ref 98–111)
Creatinine, Ser: 0.58 mg/dL (ref 0.44–1.00)
GFR, Estimated: >60 mL/min (ref >60)
Glucose, Bld: 100 mg/dL — ABNORMAL HIGH (ref 70–99)
Potassium: 3.5 mmol/L (ref 3.5–5.1)
Sodium: 140 mmol/L (ref 135–145)

## 2022-03-02 LAB — PROTIME-INR
INR: 1 (ref 0.8–1.2)
Prothrombin Time: 13.5 seconds (ref 11.4–15.2)

## 2022-03-02 LAB — URINE DRUG SCREEN, QUALITATIVE (ARMC ONLY)
Amphetamines, Ur Screen: NOT DETECTED
Barbiturates, Ur Screen: NOT DETECTED
Benzodiazepine, Ur Scrn: NOT DETECTED
Cannabinoid 50 Ng, Ur ~~LOC~~: NOT DETECTED
Cocaine Metabolite,Ur ~~LOC~~: NOT DETECTED
MDMA (Ecstasy)Ur Screen: NOT DETECTED
Methadone Scn, Ur: NOT DETECTED
Opiate, Ur Screen: NOT DETECTED
Phencyclidine (PCP) Ur S: NOT DETECTED
Tricyclic, Ur Screen: NOT DETECTED

## 2022-03-02 LAB — HEPATIC FUNCTION PANEL
ALT: 37 U/L (ref 0–44)
AST: 26 U/L (ref 15–41)
Albumin: 4.3 g/dL (ref 3.5–5.0)
Alkaline Phosphatase: 75 U/L (ref 38–126)
Bilirubin, Direct: <0.1 mg/dL (ref 0.0–0.2)
Total Bilirubin: 0.3 mg/dL (ref 0.3–1.2)
Total Protein: 7.3 g/dL (ref 6.5–8.1)

## 2022-03-02 LAB — IRON,TIBC AND FERRITIN PANEL
Ferritin: 70 ng/mL (ref 15–150)
Iron Saturation: 27 % (ref 15–55)
Iron: 83 ug/dL (ref 27–159)
Total Iron Binding Capacity: 307 ug/dL (ref 250–450)
UIBC: 224 ug/dL (ref 131–425)

## 2022-03-02 LAB — APTT: aPTT: 29 seconds (ref 24–36)

## 2022-03-02 MED ORDER — CLONAZEPAM 0.5 MG PO TABS
0.5000 mg | ORAL_TABLET | Freq: Once | ORAL | Status: AC
  Administered 2022-03-02: 0.5 mg via ORAL
  Filled 2022-03-02: qty 1

## 2022-03-02 MED ORDER — LORAZEPAM 2 MG/ML IJ SOLN
1.0000 mg | Freq: Once | INTRAMUSCULAR | Status: AC
  Administered 2022-03-02: 1 mg via INTRAVENOUS

## 2022-03-02 MED ORDER — LEVETIRACETAM IN NACL 1500 MG/100ML IV SOLN: 1500.0000 mg | INTRAVENOUS | Status: DC

## 2022-03-02 MED ORDER — CLONAZEPAM 0.5 MG PO TABS: 0.5000 mg | ORAL_TABLET | Freq: Every day | ORAL | 0 refills | Status: DC

## 2022-03-02 MED ORDER — LORAZEPAM 2 MG/ML IJ SOLN
1.0000 mg | Freq: Once | INTRAMUSCULAR | Status: AC
  Administered 2022-03-02: 1 mg via INTRAVENOUS
  Filled 2022-03-02: qty 1

## 2022-03-02 MED ORDER — LEVETIRACETAM IN NACL 1000 MG/100ML IV SOLN
1000.0000 mg | Freq: Once | INTRAVENOUS | Status: AC
  Administered 2022-03-02: 1000 mg via INTRAVENOUS
  Filled 2022-03-02: qty 100

## 2022-03-02 MED ORDER — IOHEXOL 350 MG/ML SOLN
75.0000 mL | Freq: Once | INTRAVENOUS | Status: AC | PRN
  Administered 2022-03-02: 75 mL via INTRAVENOUS

## 2022-03-02 NOTE — ED Notes (Addendum)
Pt called this RN over to her, pt keeps stating, "I don't feel right, I feel dizzy." Pt unable to give this RN a LKW. Pt keeps repeating, "I had a stroke. I had a stroke." Pt states she feels dizzy, nauseous, and  having abd pain. Spoke with Dr. Ellender Hose, EDP and added a CT head at this time

## 2022-03-02 NOTE — Telephone Encounter (Signed)
Called interpreter line, spoke with Remo Lipps [phone] to call patient with lab results. He LVM, advised patient that recent labs look good. If she is interested, NP can send her in a medication to help with headaches.

## 2022-03-02 NOTE — ED Triage Notes (Signed)
Pt here via [facility] from home with weakness on her right side. Stroke screen negative per ems. Right leg weaker, previous stroke with right side deficit in her leg. Pt states she is dizzy and weak.   143-cbg 126/55 60 SR 96% RA 18G LAC

## 2022-03-02 NOTE — ED Notes (Signed)
Activated Code Stroke w/[facility] 

## 2022-03-02 NOTE — ED Notes (Signed)
Leonel Ramsay, MD at CT assessing patient.

## 2022-03-02 NOTE — ED Notes (Signed)
Code Stroke called at this time. Patient taken to CT by this RN on the monitor.

## 2022-03-02 NOTE — Consult Note (Signed)
Neurology Consultation Reason for Consult: Concern for seizures Referring Physician: Charna Archer, C  CC: Concern for seizures  History is obtained from: Patient, husband  HPI: GERTRUE WILLETTE is a 46 y.o. 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  Subsequent to that, she began having seizures and has been maintained on Keppra.  Her most recent was in march of this year.  She apparently had difficulty tolerating 1.5 g twice daily of Keppra, and this was decreased in March and she has been stable since that time.  She had a follow-up visit yesterday.  She thinks that she missed her dose of Keppra this morning.  Today, she was in her normal state of health until approximately 2 PM at which point she had an episode of distress.  She describes it as feeling that there is a rising sensation in her stomach, followed by a feeling of "despair."  She has trouble describing it further.  While in the ER, she had multiple episodes where she became quite distressed and she would hold her right leg flexed, though this could easily be repositioned and she was able to move it voluntarily with no increased tone during the episode.   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 She has never used smokeless tobacco. She reports that she does not currently use alcohol. She reports that she does not use drugs.   Exam: Current vital  signs: BP 115/67   Pulse 71   Temp 97.6 F (36.4 C) (Oral)   Resp 12   Ht 5' (1.524 m)   Wt 62.1 kg   SpO2 100%   BMI 26.74 kg/m  Vital signs in last 24 hours: Temp:  [97.6 F (36.4 C)] 97.6 F (36.4 C) (09/13 1440) Pulse Rate:  [67-93] 71 (09/13 1600) Resp:  [12-20] 12 (09/13 1600) BP: (110-119)/(53-102) 115/67 (09/13 1600) SpO2:  [96 %-100 %] 100 % (09/13 1600) Weight:  [62.1 kg] 62.1 kg (09/13 1441)   Physical Exam  Constitutional: Appears well-developed and well-nourished.   Neuro: Mental Status: Patient is awake, alert, oriented to person, place, month, some difficulty with year, states 2021 Patient is able to give a clear and coherent history. No signs of aphasia or neglect Cranial Nerves: II: She has a right field cut. Pupils are equal, round, and reactive to light.   III,IV, VI: EOMI without ptosis or diploplia.  V: Facial sensation is symmetric to temperature VII: Facial movement is symmetric.  VIII: hearing is intact to voice X: Uvula elevates symmetrically XI: Shoulder shrug is symmetric. XII: tongue is midline without atrophy or fasciculations.  Motor: She has significant giveaway weakness of the right upper extremity.  When I hold it aloft, she lets it immediately flopped to the bed, though with repeated insistence, she is able to hold it aloft.  She is also able to perform finger-nose-finger when  distracted.  She is able to hold both legs aloft against gravity without drift. Sensory: She reports decreased sensation on the right Cerebellar: No ataxia on finger-nose-finger     I have reviewed labs in epic and the results pertinent to this consultation are: Unremarkable CBC and BMP  I have reviewed the images obtained: CT/CTA-negative for acute findings, she does have an area of encephalomalacia associated with her previous hemorrhage  Impression: 46 year old female with recurrent episodes which by description sound most consistent with epigastric  rising/sense of impending doom.  This is concerning for partial seizure, however she also has multiple findings on exam which could be suspicious for nonorganic etiology of these events.  Given her history, this would be an absolute diagnosis of exclusion, and I would favor treating them as seizures for the time being.  Given that she missed the dose of her medicine, and has had difficulty tolerating higher doses but was well controlled previously, I would not favor increasing her Keppra at this time.  She has received 2 mg of IV Ativan, if she remains episode free for multiple hours after her Keppra load and Ativan dose, then could plan to let her go home with two doses of clonazepam to help prevent further seizure for a period.   If she continues to have episodes, then could load with Depakote and she would need admission at that point, if the episodes remain frequent, she will likely need transfer for characterization of spells.  Recommendations: 1) continue Keppra 1 g in the morning and 1500 at night 2) if she continues to have spells would load with 1.5 g of IV Depacon and start 500 mg twice daily 3) if the spells remain frequent, likely needs to be transferred for spell characterization. 4) neurology will continue to follow if she is admitted.   Roland Rack, MD [facility] [phone]  If 7pm- 7am, please page neurology on call as listed in [place].

## 2022-03-02 NOTE — Progress Notes (Signed)
   03/02/22 1600  Clinical Encounter Type  Visited With Patient and family together  Visit Type Initial;Code  Referral From Nurse  Consult/Referral To Chaplain  Spiritual Encounters  Spiritual Needs Prayer   Spanish only. Chaplain provided support and care and was asked by husband of patient to pray for them.

## 2022-03-02 NOTE — ED Provider Notes (Signed)
[facility] Provider Note    Event Date/Time   First MD Initiated Contact with Patient 03/02/22 1508     (approximate)   History   Chief Complaint Weakness   HPI  Erica Rosario is a 46 y.o. female with past medical history of venous sinus thrombosis with hemorrhagic conversion, left occipital stroke, seizures, and anemia who presents to the ED complaining of weakness.  History is limited as patient is Spanish-speaking only and history obtained via interpreter [phone].  Patient initially states that she began feeling numb and weak in both of her arms and legs around 2 PM this afternoon.  When asked to further describe the symptoms, patient has significant difficulty, repeatedly tells her husband "tell them I do not remember."  She does not respond when asked if she is having a headache, does state that her vision has been blurry in both eyes today.  Patient reports that she has had previous stroke, denies any deficits from this.  Patient is not currently taking any anticoagulation other than daily baby aspirin.  Near the conclusion of my assessment, patient begins hyperventilating and grabbing her right leg, does not respond when asked if she is in pain.     Physical Exam   Triage Vital Signs: ED Triage Vitals  Enc Vitals Group     BP 03/02/22 1440 (!) 110/53     Pulse Rate 03/02/22 1440 67     Resp 03/02/22 1440 18     Temp 03/02/22 1440 97.6 F (36.4 C)     Temp Source 03/02/22 1440 Oral     SpO2 03/02/22 1440 96 %     Weight 03/02/22 1441 136 lb 14.5 oz (62.1 kg)     Height 03/02/22 1441 5' (1.524 m)     Head Circumference --      Peak Flow --      Pain Score 03/02/22 1441 0     Pain Loc --      Pain Edu? --      Excl. in [place]? --     Most recent vital signs: Vitals:   03/02/22 1834 03/02/22 1930  BP:  (!) 109/59  Pulse:  81  Resp:  20  Temp: 98.2 F (36.8 C)   SpO2:  100%    Constitutional: Alert and oriented. Eyes: Conjunctivae are  normal.  Pupils equal, round, and reactive to light bilaterally. Head: Atraumatic. Nose: No congestion/rhinnorhea. Mouth/Throat: Mucous membranes are moist.  Cardiovascular: Normal rate, regular rhythm. Grossly normal heart sounds.  2+ radial and DP pulses bilaterally. Respiratory: Normal respiratory effort.  No retractions. Lungs CTAB. Gastrointestinal: Soft and nontender. No distention. Musculoskeletal: No lower extremity tenderness nor edema.  Neurologic:  Normal speech and language.  5 out of 5 strength in bilateral upper extremities, 3 out of 5 strength in right lower extremity, 4 out of 5 strength in left lower extremity.    ED Results / Procedures / Treatments   Labs (all labs ordered are listed, but only abnormal results are displayed) Labs Reviewed  BASIC METABOLIC PANEL - Abnormal; Notable for the following components:      Result Value   Glucose, Bld 100 (*)    All other components within normal limits  CBC - Abnormal; Notable for the following components:   RDW 16.5 (*)    All other components within normal limits  URINALYSIS, ROUTINE W REFLEX MICROSCOPIC - Abnormal; Notable for the following components:   Color, Urine STRAW (*)  APPearance CLEAR (*)    All other components within normal limits  CBC WITH DIFFERENTIAL/PLATELET - Abnormal; Notable for the following components:   RDW 16.5 (*)    All other components within normal limits  PROTIME-INR  APTT  URINE DRUG SCREEN, QUALITATIVE ([facility] ONLY)  HEPATIC FUNCTION PANEL  LEVETIRACETAM LEVEL  CBG MONITORING, ED  POC URINE PREG, ED     EKG  ED ECG REPORT I, Blake Divine, the attending physician, personally viewed and interpreted this ECG.   Date: 03/02/2022  EKG Time: 14:46  Rate: 62  Rhythm: normal sinus rhythm  Axis: Normal  Intervals:none  ST&T Change: None  RADIOLOGY CT head reviewed and interpreted by me with no hemorrhage or midline shift.  PROCEDURES:  Critical Care performed: Yes, see  critical care procedure note(s)  Procedures   MEDICATIONS ORDERED IN ED: Medications  clonazePAM (KLONOPIN) tablet 0.5 mg (has no administration in time range)  LORazepam (ATIVAN) injection 1 mg (1 mg Intravenous Given 03/02/22 1557)  iohexol (OMNIPAQUE) 350 MG/ML injection 75 mL (75 mLs Intravenous Contrast Given 03/02/22 1544)  LORazepam (ATIVAN) injection 1 mg (1 mg Intravenous Given 03/02/22 1605)  levETIRAcetam (KEPPRA) IVPB 1000 mg/100 mL premix (0 mg Intravenous Stopped 03/02/22 1655)     IMPRESSION / MDM / [place] / ED COURSE  I reviewed the triage vital signs and the nursing notes.                              46 y.o. female with past medical history of venous sinus thrombosis with hemorrhagic conversion, left occipital stroke, seizures, and anemia who presents to the ED with acute onset numbness and weakness in her extremities that is difficult to localize as patient repeatedly stating she does not remember.  Patient's presentation is most consistent with acute presentation with potential threat to life or bodily function.  Differential diagnosis includes, but is not limited to, stroke, TIA, venous sinus thrombosis, SAH, focal seizure, electrolyte abnormality, migraine, anxiety.  Patient uncomfortable appearing but in no acute distress on my initial assessment, vital signs are unremarkable.  Obtaining history is very difficult as patient only intermittently responds to video interpreter, initially states that she is feeling numb and weak in all 4 of her extremities, however on neurologic exam she seems to have significant weakness primarily in her right leg.  Husband reports acute onset at 2 PM this afternoon and given focal neurologic deficit, code stroke was activated.  Shortly afterwards, patient appeared to be in pain and clutching her right leg, appeared to have spasm in the leg potentially concerning for focal seizure.  CT head was obtained and negative for acute  process, Dr. Leonel Ramsay of neurology is at bedside and recommends proceeding with CTA of head and neck.  CTA head and neck is also unremarkable.  Patient evaluated by neurology and low suspicion for stroke at this time, Dr. Saralyn Pilar recommends against MRI given no focal neurologic deficits on current exam.  Patient's intermittent episodes could represent focal seizure, but there is also concern for nonorganic etiology based off of exam.  Patient did not have any additional episodes here in the ED after IV Ativan, was observed for multiple hours.  Given no further episodes after Ativan administration, we will not make any changes to her anticonvulsant medication and Dr. Leonel Ramsay agrees that she is appropriate for discharge home with close neurology follow-up.  He does recommend giving patient dose of Klonopin  here in the ED followed by 1 additional dose tomorrow morning, which was prescribed.  Patient counseled to return to the ED for new or worsening symptoms, patient agrees with plan.      FINAL CLINICAL IMPRESSION(S) / ED DIAGNOSES   Final diagnoses:  Weakness  History of seizure     Rx / DC Orders   ED Discharge Orders          Ordered    clonazePAM (KLONOPIN) 0.5 MG tablet  Daily        03/02/22 2043             Note:  This document was prepared using Dragon voice recognition software and may include unintentional dictation errors.   Blake Divine, MD 03/02/22 2047

## 2022-03-02 NOTE — ED Notes (Addendum)
Patient back to room with RN, neurologist, pharmacy team, and [place] interpreter.

## 2022-03-07 NOTE — Progress Notes (Unsigned)
[facility] [address] [place]. [place] [address] [phone]       OFFICE FOLLOW UP NOTE  [name] Date of Birth:  04-06-1976 Medical Record Number:  [id number]   Reason for visit: Recent seizure    SUBJECTIVE:   CHIEF COMPLAINT:  No chief complaint on file.    HPI:    Update 03/07/2022 Erica Rosario: Patient returns for acute visit due to recent possible seizure.  She presented to [facility] ED on 9/13 complaining of episode of distress, describing it as a rising sensation in her stomach followed by a feeling of "despair" but difficulty fully describing, while in ED multiple episodes where she became quite distressed and she would hold her right leg flexed, could be easily repositioned and able to move it voluntarily without increased tone during episode.  Evaluated by neurologist Dr. Leonel Ramsay with symptoms consistent with epigastric rising/sense of impending doom concerning a partial seizure however multiple findings on exam which could be suspicious for nonorganic etiology of these events.  CT head and CTA head/neck unremarkable.  She did receive 2 mg of IV Ativan and Keppra load and remained episode free for multiple hours.  Did miss a dose of her medication that morning and due to difficulty tolerating higher dosage, recommended continuation of current Keppra dosage.       History provided for reference purposes only Update 03/01/2022 Erica Rosario: Patient returns for 48-monthstroke and seizure follow-up accompanied [facility].  Overall stable without any reoccurring seizure activity or new stroke/TIA symptoms.  She does complain of recurrence of typical frontal headaches which have worsened over the past 4 months.  Previously associated with iron deficiency anemia.  Does report having issues with heavy menstrual cycles over the past few months and ended up undergoing hysterectomy on 8/3.  She did receive iron infusion back in July, has not had repeat  lab work since that time.  She is scheduled to follow-up with OB next week.  Compliant on Keppra 1000/1500, denies side effects.  Compliant on aspirin and atorvastatin, denies side effects.  Blood pressure today 110/60.  No further concerns at this time  Update 09/14/2021 Erica Rosario: Patient returns for acute visit due to recent seizure activity.  She is accompanied by her sister and [facility].  [facility] a witnessed generalized tonic-clonic seizure lasting approximately 5 minutes.  She was confused and postictal upon arrival.  She had additional tonic-clonic seizure in ED lasting approximately 1 to 2 minutes, received IV Ativan and Keppra load.  Lab work largely within normal limits and CTH no acute abnormality.  Due to likely unprovoked seizure, Keppra dosage increased to 1500 mg twice daily.  Since discharge, she has been doing well without any reoccurring seizure activity.  Remains on Keppra 1500 mg twice daily but does complain on dizziness and nauseous since starting which was a prior compliant on higher keppra dosage. Prior to seizure onset, denies any missed dosages, significantly elevated stress levels, recent medication changes or any potential seizure provoking triggers.   Update 07/26/2021 Erica Rosario: Returns for follow-up visit after prior visit 4 months ago with Dr. SLeonie Manfor recent breakthrough seizure and hx of stroke. She is accompanied by a friend and [facility]preter.  Overall stable since prior visit without any additional seizure activity, compliant on Keppra 500/'1000mg'$  without side effects (prior difficulty tolerating '1000mg'$  BID dosing which improved after dose adjustment). EEG 04/2021 unremarkable without evidence of seizures.  Stable from stroke standpoint without new stroke/TIA symptoms and stable residual deficit of right peripheral visual impairment.  Compliant on aspirin and atorvastatin without side effects.  Blood pressure today 106/72. At  prior visit, topamax discontinued as no headaches but shortly after stopping, expericing reoccurring headaches - PCP restarted Topamax, headaches stable but can occur with increased stress. Recently started on venlafaxine for premenopausal symptoms.  No new concerns at this time.   Update 04/07/2021 Dr. Leonie Man Patient returns for follow-up after last visit 2 months ago.  She is accompanied by brother and Spanish language interpreter was present throughout this visit.  Patient was seen in the ER on 04/03/2021 with breakthrough seizures.  She presented with visual problems which was similar to what she had during her previous episode of venous sinus thrombosis and subsequently went on to have multiple seizures.  Patient denied missing any recent doses of medicines.  She was loaded with 2 g of Keppra and Ativan 2 mg.  Her maintenance dose of Keppra was increased to 1 g twice daily.  Patient states she does not remember the episode of the hospital visit.  She states she is done well since then and has not had any further recurrent seizures but she complains of feeling dizzy and having nausea and trouble tolerating the higher dose.  She did also have CT scan of the head as well as CT angiogram of the neck and brain both of which were unremarkable on 04/03/2021.  Patient denies significant headaches or numbness but she is on Topamax 25 mg twice daily and is wondering if she can stop it.  She has no new complaints today.  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  Update 01/18/2021 Erica Rosario: [name] returns for 48-monthstroke follow-up accompanied by sister and [facility] interpreter.  [facility]iness and occipital and frontal headache associated with bright lights in right eye over the past week.  Seen in ED for similar symptoms on 3/7 and diagnosed with iron deficiency anemia possibly in  setting of heavy menstrual cycle.  Started menstrual cycle on 7/23 but symptoms much worse compared to prior symptoms. Dizziness improved after meclizine and received IM Reglan for headache.  CT head unremarkable for acute findings.  Lab work unremarkable.  Symptoms have since resolved.  Is scheduled for IUD implant on 8/8 due to continued heavy irregular menstrual cycles.  Of note, she did have IUD placed 7/12 to help control menstrual cycles with heavy bleeding.  She describes the dizziness as a lightheaded feeling.  She has had vertigo in the past and reports different dizziness symptoms associated with menstrual cycle.  Residual stroke deficit right peripheral visual impairment stable.  Denies any other new stroke/TIA symptoms. Compliant on warfarin with reported heavy menstrual cycle since starting and atorvastatin without associated side effects. Blood pressure today 110/66. Reports complaince on keppra '500mg'$  BID without associated side effects and denies any seizure activity.  No further concerns at this time.  Update 09/09/2020 Erica Rosario: Mrs. MLopatareturns for stroke follow-up after prior visit approximately 4 months ago accompanied by interpreter.   Residual deficits of mild right peripheral visual impairment but is improving.  She denies residual right hand numbness Evaluated in the ED 08/24/2020 with headache, dizziness and visual complaints. MRI and MRV unremarkable for acute findings.  Noted to be anemic with low  iron levels which was felt possibly related to heavy menstrual cycles. Headache and dizziness have since resolved. She has since f/u with OB/GYN with plans on placing IUD to help control menstrual cycles.  Also follow-up with PCP who initiated ferrous sulfate. Has f/u next week for repeat lab work Denies new stroke/TIA symptoms  reports bilateral hand finger joint pain with increased stiffness and quick lasting "tremor" with extension of the fingers upon awakening which has been present  over the past month.  Denies any weakness, pain in other locations such as wrist or arm or numbness/tingling  Continues on Keppra 500 mg twice daily -tolerating denies any recent seizure activity  Remains on warfarin with INR levels monitored by PCP- per patient, INR level stable - unable to view via epic - aside from heavy menstrual cycles, no other bleeding or bruising concerns Remains on atorvastatin -denies associated side effects  Blood pressure today 110/67 - does not routinely monitor at home  No further concerns at this time  Initial visit 05/12/2020 Erica Rosario: Ms. Laden is being seen for hospital follow-up accompanied by her brother and interpreter. She reports she has been improving but continues to have right hand numbness, right peripheral visual impairment and gait impairment due to visual difficulty. She has not done any therapies since discharge due to lack of insurance.  She is in the process of applying for [employer] financial assistance as well as other insurance..  Denies new or worsening stroke/TIA symptoms. 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  She lives with her husband and 2 sons able to maintain ADLs independently and has been slowly returning to completing IADLs.  She continues on Keppra 500 mg twice daily tolerating well and denies any reoccurring seizure activity.  She remains on warfarin without bleeding or bruising with INR levels monitored by PCP weekly. Per patient, INR levels have been greater than 3 so currently doing dosage adjustments per PCP. Remains on atorvastatin without myalgias. Blood pressure today 114/68 - not routinely monitored at home.  She has since discontinued OCP.  No further concerns at this time.  Stroke admission 03/25/2020 Erica Rosario: Ms. MAKAIA RAPPA is a 46 y.o. female with no PMH  who presented to [facility] with HA and neck stiffness on 03/25/2020.   Personally reviewed  hospitalization pertinent progress notes, lab work and imaging with summary provided.  Shortly transferred to [facility] and evaluated by Dr. 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  Repeat CT head showed hemorrhagic transformation L parietal with slight increase edema with most hemorrhagic foci unchanged and single new hemorrhage at vertex.  Repeat CT head 3 hours later stable. Pan CT negative for malignancy but did show B renal vein nonocclusive thrombosis. CTV and CTA h/n negative LVO with partial thrombosis of the superior sagittal sinus at the vertex, nonocclusive as well can be demonstrated around the margins of the thrombus with patency beyond that.  Also showed thrombosed superficial draining vein of the left again visible, similar in appearance with low density edema slightly increased and mild swelling with mass-effect and 1 to 2 mm of left-to-right shift and flattening of the left lateral ventricle.  Questionable hypercoagulable state with hypercoagulable showed anticardiolipin IgM 19 (indeterminate) and protein S total and active mildly low but likely inaccurate on heparin.  Advise to discontinue OCP use.  Placed  on heparin IV and warfarin daily. GTC in ER s/p Ativan and Keppra load and initiated Keppra 500 mg twice daily with LTM showing left frontal temporal continuous slowing and bilateral parieto-occipital spikes.  LDL UTC with direct LDL of 151 no statin PTA and initiated atorvastatin 40 mg daily.  No prior history of HTN or DM.  Other stroke risk factors include history of EtOH use and overweight but no prior stroke history.  Other active problems include anemia, leukocytosis and hypokalemia.  Evaluated by therapies and recommended discharge to CIR for ongoing therapy needs.  She was discharged to CIR on 03/31/2020 and discharged home on 04/03/2020 on Keppra, Topamax and  warfarin.    She unfortunately returned on 04/04/2020 with complaint of headache, generalized weakness and "faintness" as well as nauseated and several episodes of emesis but no focal deficits noted.  She was found to have worsening vasogenic edema in left parieto-occipital region and worsening midline shift of 13 mm.  Warfarin discontinued and reversed with vitamin K.  Administering mannitolx1, Decadron, hypertonic saline and heparin drip.  Repeat CT head showed improved MLS, cerebral edema and decreased hydrocephalus and again repeat CT head showed stable edema with decreasing IPH and same 1 cm midline shift. NSG on board PRN.  Also found to have severe anemia receiving 2U PRBC with improvement potentially contributing to syncopal event on 10/15 at home.  Elevated liver enzymes of unclear etiology possibly related to Topamax therefore DC'd.  Restarted warfarin with INR goal 2-3 in addition to Lovenox for total of 4 doses.  Advised to follow-up with [facility] on 10/25 for pro time blood test and CBC as well as INR levels and adjustment of warfarin as indicated and possible discontinuation of Lovenox.  Discharged on dexamethasone.  Reevaluated by therapy recommended outpatient OT and no PT needs.  She was discharged home on 04/11/2020.   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 Also thrombosis draining cortical vein. 2 areas enhancement in L occipital hemorrhage possible ongoing hemorrhage. Similar edema w/ slight R shift. CTA and CTV 10/9 - Partial thrombosis of the superior sagittal sinus at the vertex, Thrombosed superficial draining vein on the left, Hemorrhagic infarction in the left  parietal cortical and subcortical brain  Pan CT neg for malignancy, B renal vein nonocclusive thrombosis. CTV 10/16 Decreased volume of nonocclusive superior sagittal sinus Thrombus. Decreased conspicuity of thrombus within an adjacent left cortical vein. Repeat CT head improved MLS, cerebral edema and decreased hydrocephalus  2D Echo EF 60-65%. No source of embolus  LE dopplers 10/19 - no DVT  LDL UTC, TC 210, TG 132, direct LDL 151.9 HgbA1c 5.6 UDS +benzos Hypercoagulable labs unremarkable (anticardiolipin IgM slightly elevated, prot S total and activity mildly low - not accurate on heparin) VTE prophylaxis - heparin IV   No antithrombotic prior to admission, now on full dose lovenox. On coumadin with INR goal 2-3.  Today INR 1.5 Decadron '4mg'$  IV q6h ->'2mg'$  IV q 6h -> '2mg'$  po Q6h -> '2mg'$  Q8  Therapy recommendations:  Outpt OT, no PT - gave pt info for pro bono therapies at [facility] Disposition:  home  Cerebral Edema CT head Left parieto-occipital venous infarct with increased edema and mass effect including 13 mm  of rightward midline shift. New mild dilatation of the right lateral ventricle suggesting early trapping Repeat CT improved MLS, cerebral edema and decreased hydrocephalus CT repeat stable edema, decreasing IPH. Same 1cm midline shift  NSG on board PRN S/p mannitol x 1 Decadron '10mg'$  x 1 -> '4mg'$  Q6 -> '2mg'$  Q6 ->'2mg'$  Q8 -> tapering On 3% at 75 -> 40 cc / hr -> 20cc->off Increase topomax to '50mg'$  bid -> d/c PICC 10/18->10/22 removed      ROS:   14 system review of systems performed and negative with exception of those listed in HPI  PMH:  Past Medical History:  Diagnosis Date   Anemia    Hemorrhagic cerebrovascular accident (CVA) ([place]) 03/25/2020   Seizures ([place])    Thrombosis of superior sagittal sinus 03/25/2020   a.) thrombosis of the posterior aspect of the superior sagittal sinus with hemorrhagic transformation of a venous infarct in the LEFT occipital lobe; (+)  vasogenic edema with regional mass effect resulting in LEFT to RIGHT midline shift    PSH:  Past Surgical History:  Procedure Laterality Date   VAGINAL HYSTERECTOMY N/A 01/20/2022   Procedure: HYSTERECTOMY VAGINAL WITH BILATERAL SALPINGECTOMY;  Surgeon: Boykin Nearing, MD;  Location: [facility] ORS;  Service: Gynecology;  Laterality: N/A;    Social History:  Social History   Socioeconomic History   Marital status: Married    Spouse name: Not on file   Number of children: Not on file   Years of education: Not on file   Highest education level: Not on file  Occupational History   Not on file  Tobacco Use   Smoking status: Never   Smokeless tobacco: Never  Vaping Use   Vaping Use: Never used  Substance and Sexual Activity   Alcohol use: Not Currently   Drug use: Never   Sexual activity: Not on file  Other Topics Concern   Not on file  Social History Narrative   Not on file   Social Determinants of Health   Financial Resource Strain: Not on file  Food Insecurity: Not on file  Transportation Needs: Not on file  Physical Activity: Not on file  Stress: Not on file  Social Connections: Not on file  Intimate Partner Violence: Not on file    Family History:  Family History  Problem Relation Age of Onset   Breast cancer Neg Hx     Medications:   Current Outpatient Medications on File Prior to Visit  Medication Sig Dispense Refill   acetaminophen (TYLENOL) 500 MG tablet Take 500 mg by mouth every 6 (six) hours as needed.     aspirin EC 81 MG tablet Take 1 tablet (81 mg total) by mouth daily. Swallow whole. 30 tablet 0   atorvastatin (LIPITOR) 40 MG tablet TAKE 1 TABLET (40 MG TOTAL) BY MOUTH DAILY. (Patient taking differently: Take 40 mg by mouth daily.) 30 tablet 0   clonazePAM (KLONOPIN) 0.5 MG tablet Take 1 tablet (0.5 mg total) by mouth daily for 1 day. 1 tablet 0   Evening Primrose Oil 1000 MG CAPS Take 3,000 mg by mouth daily.     levETIRAcetam (KEPPRA) 500 MG  tablet Take 2 tablets (1,000 mg total) by mouth every morning AND 3 tablets (1,500 mg total) at bedtime. 450 tablet 3   No current facility-administered medications on file prior to visit.    Allergies:  No Known Allergies    OBJECTIVE:  Physical Exam  There were no vitals filed for this visit.  There  is no height or weight on file to calculate BMI. No results found.   General: well developed, well nourished, very pleasant middle-aged Hispanic female, seated, in no evident distress Head: head normocephalic and atraumatic.   Neck: supple with no carotid or supraclavicular bruits Cardiovascular: regular rate and rhythm, no murmurs Musculoskeletal: no deformity Skin:  no rash/petichiae Vascular:  Normal pulses all extremities   Neurologic Exam Mental Status: Awake and fully alert. Limited English primarily Spanish-speaking. Denies dysarthria or aphasia.  Oriented to place and time. Recent and remote memory intact. Attention span, concentration and fund of knowledge appropriate. Mood and affect appropriate.  Cranial Nerves: Pupils equal, briskly reactive to light. Extraocular movements full without nystagmus. Visual fields right homonymous inferior quadrantanopia. Hearing intact. Facial sensation intact. Face, tongue, palate moves normally and symmetrically.  Motor: Normal bulk and tone. Normal strength in all tested extremity muscles. Sensory.: intact to touch , pinprick , position and vibratory sensation.  Coordination: Rapid alternating movements normal in all extremities. Finger-to-nose and heel-to-shin performed accurately bilaterally.  No evidence of action or rest tremors or abnormal hand movements Gait and Station: Arises from chair without difficulty. Stance is normal. Gait demonstrates normal stride length and balance without use of assistive device.  Able to perform tandem walk and heel toe without difficulty.   Reflexes: 1+ and symmetric. Toes downgoing.        ASSESSMENT: Erica Rosario is a 46 y.o. 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 On 04/04/2020 (d/c'd home from [place] on 10/15 in stable condition) with worsening headache, N/V and generalized weakness found to have worsening vasogenic edema in left parieto-occipital region and worsening midline shift of 13 mm requiring reversal of warfarin, mannitol x1, Decadron and hypertonic saline with improvement.  Hospital course complicated by severe anemia, leukocytosis and fever, and elevated liver enzymes (? R/t Topamax).  Recurrent generalized tonic-clonic seizure 09/10/2021.  Vascular risk factors include anemia, HLD and OCP use.  Recurrent frontal headaches usually associated with iron deficiency anemia.     PLAN:  Sagittal sinus thrombosis w/ hemorrhagic transformation:  Residual deficit: Right inferior homonymous quadrantanopia  MRV 08/24/2020 no evidence of residual thrombosis Continue aspirin 81 mg daily and atorvastatin for secondary stroke prevention.  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 and 1500 mg PM (intolerant to 1500 mg BID dosage, seizure occurred on 500 mg AM and '1000mg'$  p.m. dosage) Breakthrough seizure 09/06/2021 -witnessed tonic-clonic seizure lasting 5 minutes; prior sz 03/1021 EEG 09/2021 unremarkable Discussed avoiding seizure provoking triggers and ensuring medication compliance  Chronic frontal  headaches Typically associated with iron deficiency -repeat CBC and iron panel today If abnormal, will forward to OBGYN for further recommendations If normal, can consider starting daily preventative medication    Follow-up in 6 months or call earlier if needed   CC:  Center, [facility]    I spent 36 minutes of face-to-face and non-face-to-face time with patient and interpreter.  This included previsit chart review including review of recent hospitalization, lab review, study review, order entry, electronic health record  documentation, patient education and discussion regarding above diagnoses and treatment plan and answered all the questions to patient satisfaction  Frann Rider, [facility]  [facility] [address] [place], [place] [address]  Phone [phone] Fax [phone] Note: This document was prepared with digital dictation and possible smart phrase technology. Any transcriptional errors that result from this process are unintentional.

## 2022-03-08 ENCOUNTER — Encounter: Payer: Self-pay | Admitting: Adult Health

## 2022-03-08 ENCOUNTER — Ambulatory Visit (INDEPENDENT_AMBULATORY_CARE_PROVIDER_SITE_OTHER): Payer: 59 | Admitting: Adult Health

## 2022-03-08 VITALS — BP 111/60 | HR 66 | Ht 60.0 in | Wt 136.0 lb

## 2022-03-08 DIAGNOSIS — R519 Headache, unspecified: Secondary | ICD-10-CM | POA: Diagnosis not present

## 2022-03-08 DIAGNOSIS — G08 Intracranial and intraspinal phlebitis and thrombophlebitis: Secondary | ICD-10-CM | POA: Diagnosis not present

## 2022-03-08 DIAGNOSIS — I639 Cerebral infarction, unspecified: Secondary | ICD-10-CM | POA: Diagnosis not present

## 2022-03-08 DIAGNOSIS — G40909 Epilepsy, unspecified, not intractable, without status epilepticus: Secondary | ICD-10-CM

## 2022-03-08 MED ORDER — TOPIRAMATE 25 MG PO TABS: 25.0000 mg | ORAL_TABLET | Freq: Two times a day (BID) | ORAL | 5 refills | Status: DC

## 2022-03-08 NOTE — Patient Instructions (Addendum)
Continue keppra '1000mg'$  AM and '1500mg'$  PM for seizure prevention - please ensure you do not miss any dosages as this is likely what caused your recent seizure   Restart topamax '25mg'$  twice daily for headaches, if headaches persist over the next couple of weeks please let me know and we can increase dosage further   Please let me know if your right sided numbness dose not improve, we may have to consider doing further imaging to rule out any new stroke or extension of your prior stroke  If vision continues to be an issues, would recommend further evaluation with an eye doctor       Thank you for coming to see [place] at [facility]. I hope we have been able to provide you high quality care today.  You may receive a patient satisfaction survey over the next few weeks. We would appreciate your feedback and comments so that we may continue to improve ourselves and the health of our patients.    Seizure, Adult A seizure is a sudden burst of abnormal electrical and chemical activity in the brain. Seizures usually last from 30 seconds to 2 minutes.  What are the causes? Common causes of this condition include: Fever or infection. Problems that affect the brain. These may include: A brain or head injury. Bleeding in the brain. A brain tumor. Low levels of blood sugar or salt. Kidney problems or liver problems. Conditions that are passed from parent to child (are inherited). Problems with a substance, such as: Having a reaction to a drug or a medicine. Stopping the use of a substance all of a sudden (withdrawal). A stroke. Disorders that affect how you develop. Sometimes, the cause may not be known.  What increases the risk? Having someone in your family who has epilepsy. In this condition, seizures happen again and again over time. They have no clear cause. Having had a tonic-clonic seizure before. This type of seizure causes you to: Tighten the muscles of the whole  body. Lose consciousness. Having had a head injury or strokes before. Having had a lack of oxygen at birth. What are the signs or symptoms? There are many types of seizures. The symptoms vary depending on the type of seizure you have. Symptoms during a seizure Shaking that you cannot control (convulsions) with fast, jerky movements of muscles. Stiffness of the body. Breathing problems. Feeling mixed up (confused). Staring or not responding to sound or touch. Head nodding. Eyes that blink, flutter, or move fast. Drooling, grunting, or making clicking sounds with your mouth Losing control of when you pee or poop. Symptoms before a seizure Feeling afraid, nervous, or worried. Feeling like you may vomit. Feeling like: You are moving when you are not. Things around you are moving when they are not. Feeling like you saw or heard something before (dj vu). Odd tastes or smells. Changes in how you see. You may see flashing lights or spots. Symptoms after a seizure Feeling confused. Feeling sleepy. Headache. Sore muscles. How is this treated? If your seizure stops on its own, you will not need treatment. If your seizure lasts longer than 5 minutes, you will normally need treatment. Treatment may include: Medicines given through an IV tube. Avoiding things, such as medicines, that are known to cause your seizures. Medicines to prevent seizures. A device to prevent or control seizures. Surgery. A diet low in carbohydrates and high in fat (ketogenic diet). Follow these instructions at home: Medicines Take over-the-counter and prescription medicines only  as told by your doctor. Avoid foods or drinks that may keep your medicine from working, such as alcohol. Activity Follow instructions about driving, swimming, or doing things that would be dangerous if you had another seizure. Wait until your doctor says it is safe for you to do these things. If you live in the [place] ask your local  department of motor vehicles when you can drive. Get a lot of rest. Teaching others  Teach friends and family what to do when you have a seizure. They should: Help you get down to the ground. Protect your head and body. Loosen any clothing around your neck. Turn you on your side. Know whether or not you need emergency care. Stay with you until you are better. Also, tell them what not to do if you have a seizure. Tell them: They should not hold you down. They should not put anything in your mouth. General instructions Avoid anything that gives you seizures. Keep a seizure diary. Write down: What you remember about each seizure. What you think caused each seizure. Keep all follow-up visits. Contact a doctor if: You have another seizure or seizures. Call the doctor each time you have a seizure. The pattern of your seizures changes. You keep having seizures with treatment. You have symptoms of being sick or having an infection. You are not able to take your medicine. Get help right away if: You have any of these problems: A seizure that lasts longer than 5 minutes. Many seizures in a row and you do not feel better between seizures. A seizure that makes it harder to breathe. A seizure and you can no longer speak or use part of your body. You do not wake up right after a seizure. You get hurt during a seizure. You feel confused or have pain right after a seizure. These symptoms may be an emergency. Get help right away. Call your local emergency services (911 in the [place] Do not wait to see if the symptoms will go away. Do not drive yourself to the hospital. Summary A seizure is a sudden burst of abnormal electrical and chemical activity in the brain. Seizures normally last from 30 seconds to 2 minutes. Causes of seizures include illness, injury to the head, low levels of blood sugar or salt, and certain conditions. Most seizures will stop on their own in less than 5 minutes.  Seizures that last longer than 5 minutes are a medical emergency and need treatment right away. Many medicines are used to treat seizures. Take over-the-counter and prescription medicines only as told by your doctor. This information is not intended to replace advice given to you by your health care provider. Make sure you discuss any questions you have with your health care provider. Document Revised: 12/13/2019 Document Reviewed: 12/13/2019 [employer] Patient Education  [employer].

## 2022-08-30 NOTE — Progress Notes (Unsigned)
[facility] [address] [place] [address] [phone]       OFFICE FOLLOW UP NOTE  [name] Date of Birth:  03/21/1976 Medical Record Number:  [id number]   Reason for visit: Seizure and stroke follow-up    SUBJECTIVE:   CHIEF COMPLAINT:  Chief Complaint  Patient presents with   Follow-up    Patient in room #3 with her interpreter. Patient states she still has numbness all over her legs and arms.     HPI:   Update 08/31/2022 Erica Rosario: Patient returns for 78-monthfollow-up.  She [facility].    Denies any recurrent seizure activity, remains on Keppra 1000/1500, denies side effects.  She occasionally continues to miss dosages but has been working on trying to be more diligent about this.  She reports continued right-sided numbness especially with increased exertion and will improve after resting.  At times her right hand will become numb and start to shake, can last all day. No always associated with full side right-sided numbness and denies any association with headaches. Usually in setting of increased stressors. Also mentions low back pain but denies radiating symptoms, also notes pain in both hands. Plans on scheduling f/u visit with PCP to further discuss.   Headaches have improved since prior visit, having about 1 headache every 2 months. Continues on topamax '25mg'$  twice daily, tolerating well. Can have headaches if thinking too much or trying to concentrate. Will use tylenol with benefit.   Denies any new stroke/TIA symptoms.  Continued right peripheral impairment, stable since prior visit. Compliant on aspirin and atorvastatin.  Blood pressure well-controlled.  Routinely follows with PCP.    History provided for reference purposes only Update 03/07/2022 Erica Rosario: Patient returns for acute visit due to recent possible seizure.  [facility].  [facility]  complaining of episode of distress, describing it as a rising sensation in her stomach followed by a feeling of "despair" as well as weakness and numbness in both arms and legs but difficulty fully describing, while in ED multiple episodes where she became quite distressed and she would hold her right leg flexed, could be easily repositioned and able to move it voluntarily without increased tone during episode.  Evaluated by neurologist Dr. KLeonel Ramsaywith symptoms consistent with epigastric rising/sense of impending doom concerning a partial seizure however multiple findings on exam which could be suspicious for nonorganic etiology of these events.  CT head and CTA head/neck unremarkable.  She did receive 2 mg of IV Ativan and Keppra load and remained episode free for multiple hours.  Did miss a dose of her medication that morning and due to difficulty tolerating higher dosage, recommended continuation of current Keppra dosage.  She does report continued right sided numbness which has been persistent since she was in the hospital (describes complete numbness side of face, arm and leg). Has improved some since discharge. She has not had any additional seizure like activity since discharge. She does confirm she did miss her morning dose of keppra prior to her seizure.  She has not missed any dosages since recent ED visit.  She does continue to experience headaches which was discussed at prior visit, did obtain lab work which did not show any signs of anemia (headaches previously present while anemic). She is interested in starting prophylactic therapy.  Previously taking topiramate with benefit.  Remains on aspirin and atorvastatin.  Closely followed by  PCP.  No further concerns at this time.   Update 03/01/2022 Erica Rosario: Patient returns for 79-monthstroke and seizure follow-up [facility].  Overall stable without any reoccurring seizure activity or new stroke/TIA symptoms.  She does  complain of recurrence of typical frontal headaches which have worsened over the past 4 months.  Previously associated with iron deficiency anemia.  Does report having issues with heavy menstrual cycles over the past few months and ended up undergoing hysterectomy on 8/3.  She did receive iron infusion back in July, has not had repeat lab work since that time.  She is scheduled to follow-up with OB next week.  Compliant on Keppra 1000/1500, denies side effects.  Compliant on aspirin and atorvastatin, denies side effects.  Blood pressure today 110/60.  No further concerns at this time  Update 09/14/2021 Erica Rosario: Patient returns for acute visit due to recent seizure activity.  She is accompanied by her sister and [facility].  [facility] for a witnessed generalized tonic-clonic seizure lasting approximately 5 minutes.  She was confused and postictal upon arrival.  She had additional tonic-clonic seizure in ED lasting approximately 1 to 2 minutes, received IV Ativan and Keppra load.  Lab work largely within normal limits and CTH no acute abnormality.  Due to likely unprovoked seizure, Keppra dosage increased to 1500 mg twice daily.  Since discharge, she has been doing well without any reoccurring seizure activity.  Remains on Keppra 1500 mg twice daily but does complain on dizziness and nauseous since starting which was a prior compliant on higher keppra dosage. Prior to seizure onset, denies any missed dosages, significantly elevated stress levels, recent medication changes or any potential seizure provoking triggers.   Update 07/26/2021 Erica Rosario: Returns for follow-up visit after prior visit 4 months ago with Dr. SLeonie Manfor recent breakthrough seizure and hx of stroke. She is accompanied by a friend and [facility]preter.  Overall stable since prior visit without any additional seizure activity, compliant on Keppra 500/'1000mg'$  without side effects (prior difficulty tolerating  '1000mg'$  BID dosing which improved after dose adjustment). EEG 04/2021 unremarkable without evidence of seizures.  Stable from stroke standpoint without new stroke/TIA symptoms and stable residual deficit of right peripheral visual impairment.  Compliant on aspirin and atorvastatin without side effects.  Blood pressure today 106/72. At prior visit, topamax discontinued as no headaches but shortly after stopping, expericing reoccurring headaches - PCP restarted Topamax, headaches stable but can occur with increased stress. Recently started on venlafaxine for premenopausal symptoms.  No new concerns at this time.   Update 04/07/2021 Dr. SLeonie ManPatient returns for follow-up after last visit 2 months ago.  She is accompanied by brother and Spanish language interpreter was present throughout this visit.  Patient was seen in the ER on 04/03/2021 with breakthrough seizures.  She presented with visual problems which was similar to what she had during her previous episode of venous sinus thrombosis and subsequently went on to have multiple seizures.  Patient denied missing any recent doses of medicines.  She was loaded with 2 g of Keppra and Ativan 2 mg.  Her maintenance dose of Keppra was increased to 1 g twice daily.  Patient states she does not remember the episode of the hospital visit.  She states she is done well since then and has not had any further recurrent seizures but she complains of feeling dizzy and having nausea and trouble tolerating the higher dose.  She  did also have CT scan of the head as well as CT angiogram of the neck and brain both of which were unremarkable on 04/03/2021.  Patient denies significant headaches or numbness but she is on Topamax 25 mg twice daily and is wondering if she can stop it.  She has no new complaints today.  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  Update 01/18/2021 Erica Rosario: Ms.  Gowans returns for 82-monthstroke follow-up accompanied by sister and [facility] interpreter.  [facility]iness and occipital and frontal headache associated with bright lights in right eye over the past week.  Seen in ED for similar symptoms on 3/7 and diagnosed with iron deficiency anemia possibly in setting of heavy menstrual cycle.  Started menstrual cycle on 7/23 but symptoms much worse compared to prior symptoms. Dizziness improved after meclizine and received IM Reglan for headache.  CT head unremarkable for acute findings.  Lab work unremarkable.  Symptoms have since resolved.  Is scheduled for IUD implant on 8/8 due to continued heavy irregular menstrual cycles.  Of note, she did have IUD placed 7/12 to help control menstrual cycles with heavy bleeding.  She describes the dizziness as a lightheaded feeling.  She has had vertigo in the past and reports different dizziness symptoms associated with menstrual cycle.  Residual stroke deficit right peripheral visual impairment stable.  Denies any other new stroke/TIA symptoms. Compliant on warfarin with reported heavy menstrual cycle since starting and atorvastatin without associated side effects. Blood pressure today 110/66. Reports complaince on keppra '500mg'$  BID without associated side effects and denies any seizure activity.  No further concerns at this time.  Update 09/09/2020 Erica Rosario: Mrs. MBolhuisreturns for stroke follow-up after prior visit approximately 4 months ago accompanied by interpreter.   Residual deficits of mild right peripheral visual impairment but is improving.  She denies residual right hand numbness Evaluated in the ED 08/24/2020 with headache, dizziness and visual complaints. MRI and MRV unremarkable for acute findings.  Noted to be anemic with low iron levels which was felt possibly related to heavy menstrual cycles. Headache and dizziness have since resolved. She has since f/u with OB/GYN with plans on  placing IUD to help control menstrual cycles.  Also follow-up with PCP who initiated ferrous sulfate. Has f/u next week for repeat lab work Denies new stroke/TIA symptoms  reports bilateral hand finger joint pain with increased stiffness and quick lasting "tremor" with extension of the fingers upon awakening which has been present over the past month.  Denies any weakness, pain in other locations such as wrist or arm or numbness/tingling  Continues on Keppra 500 mg twice daily -tolerating denies any recent seizure activity  Remains on warfarin with INR levels monitored by PCP- per patient, INR level stable - unable to view via epic - aside from heavy menstrual cycles, no other bleeding or bruising concerns Remains on atorvastatin -denies associated side effects  Blood pressure today 110/67 - does not routinely monitor at home  No further concerns at this time  Initial visit 05/12/2020 Erica Rosario: Ms. MKobleis being seen for hospital follow-up accompanied by her brother and interpreter. She reports she has been improving but continues to have right hand numbness, right peripheral visual impairment and gait impairment due to visual difficulty. She has not done any therapies since discharge due to lack of  insurance.  She is in the process of applying for [employer] financial assistance as well as other insurance..  Denies new or worsening stroke/TIA symptoms. 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  She lives with her husband and 2 sons able to maintain ADLs independently and has been slowly returning to completing IADLs.  She continues on Keppra 500 mg twice daily tolerating well and denies any reoccurring seizure activity.  She remains on warfarin without bleeding or bruising with INR levels monitored by PCP weekly. Per patient, INR levels have been greater than 3 so currently doing dosage adjustments per PCP. Remains on atorvastatin without myalgias. Blood  pressure today 114/68 - not routinely monitored at home.  She has since discontinued OCP.  No further concerns at this time.  Stroke admission 03/25/2020 Erica Rosario: Ms. NATALYIA DREISBACH is a 47 y.o. female with no PMH  who presented to [facility] with HA and neck stiffness on 03/25/2020.   Personally reviewed hospitalization pertinent progress notes, lab work and imaging with summary provided.  Shortly transferred to [facility] and evaluated by Dr. 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  Repeat CT head showed hemorrhagic transformation L parietal with slight increase edema with most hemorrhagic foci unchanged and single new hemorrhage at vertex.  Repeat CT head 3 hours later stable. Pan CT negative for malignancy but did show B renal vein nonocclusive thrombosis. CTV and CTA h/n negative LVO with partial thrombosis of the superior sagittal sinus at the vertex, nonocclusive as well can be demonstrated around the margins of the thrombus with patency beyond that.  Also showed thrombosed superficial draining vein of the left again visible, similar in appearance with low density edema slightly increased and mild swelling with mass-effect and 1 to 2 mm of left-to-right shift and flattening of the left lateral ventricle.  Questionable hypercoagulable state with hypercoagulable showed anticardiolipin IgM 19 (indeterminate) and protein S total and active mildly low but likely inaccurate on heparin.  Advise to discontinue OCP use.  Placed on heparin IV and warfarin daily. GTC in ER s/p Ativan and Keppra load and initiated Keppra 500 mg twice daily with LTM showing left frontal temporal continuous slowing and bilateral parieto-occipital spikes.  LDL UTC with direct LDL of 151 no statin PTA and initiated atorvastatin 40 mg daily.  No prior history of HTN or DM.  Other stroke risk  factors include history of EtOH use and overweight but no prior stroke history.  Other active problems include anemia, leukocytosis and hypokalemia.  Evaluated by therapies and recommended discharge to CIR for ongoing therapy needs.  She was discharged to CIR on 03/31/2020 and discharged home on 04/03/2020 on Keppra, Topamax and warfarin.    She unfortunately returned on 04/04/2020 with complaint of headache, generalized weakness and "faintness" as well as nauseated and several episodes of emesis but no focal deficits noted.  She was found to have worsening vasogenic edema in left parieto-occipital region and worsening midline shift of 13 mm.  Warfarin discontinued and reversed with vitamin K.  Administering mannitolx1, Decadron, hypertonic saline and heparin drip.  Repeat CT head showed improved MLS, cerebral edema and decreased hydrocephalus and again repeat CT head showed stable edema with decreasing IPH and same 1 cm midline shift. NSG on board PRN.  Also found to have severe anemia receiving 2U PRBC with  improvement potentially contributing to syncopal event on 10/15 at home.  Elevated liver enzymes of unclear etiology possibly related to Topamax therefore DC'd.  Restarted warfarin with INR goal 2-3 in addition to Lovenox for total of 4 doses.  Advised to follow-up with [place] on 10/25 for pro time blood test and CBC as well as INR levels and adjustment of warfarin as indicated and possible discontinuation of Lovenox.  Discharged on dexamethasone.  Reevaluated by therapy recommended outpatient OT and no PT needs.  She was discharged home on 04/11/2020.   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 hemorrhagic infarct w/ surrounded edema MRI  Superior sagittal sinus thrombosis w/ hemorrhagic transformation L occipital venous  infarct. Also thrombosis draining cortical vein. 2 areas enhancement in L occipital hemorrhage possible ongoing hemorrhage. Similar edema w/ slight R shift. CTA and CTV 10/9 - Partial thrombosis of the superior sagittal sinus at the vertex, Thrombosed superficial draining vein on the left, Hemorrhagic infarction in the left parietal cortical and subcortical brain  Pan CT neg for malignancy, B renal vein nonocclusive thrombosis. CTV 10/16 Decreased volume of nonocclusive superior sagittal sinus Thrombus. Decreased conspicuity of thrombus within an adjacent left cortical vein. Repeat CT head improved MLS, cerebral edema and decreased hydrocephalus  2D Echo EF 60-65%. No source of embolus  LE dopplers 10/19 - no DVT  LDL UTC, TC 210, TG 132, direct LDL 151.9 HgbA1c 5.6 UDS +benzos Hypercoagulable labs unremarkable (anticardiolipin IgM slightly elevated, prot S total and activity mildly low - not accurate on heparin) VTE prophylaxis - heparin IV   No antithrombotic prior to admission, now on full dose lovenox. On coumadin with INR goal 2-3.  Today INR 1.5 Decadron '4mg'$  IV q6h ->'2mg'$  IV q 6h -> '2mg'$  po Q6h -> '2mg'$  Q8  Therapy recommendations:  Outpt OT, no PT - gave pt info for pro bono therapies at [facility] clinic Disposition:  home  Cerebral Edema CT head Left parieto-occipital venous infarct with increased edema and mass effect including 13 mm of rightward midline shift. New mild dilatation of the right lateral ventricle suggesting early trapping Repeat CT improved MLS, cerebral edema and decreased hydrocephalus CT repeat stable edema, decreasing IPH. Same 1cm midline shift  NSG on board PRN S/p mannitol x 1 Decadron '10mg'$  x 1 -> '4mg'$  Q6 -> '2mg'$  Q6 ->'2mg'$  Q8 -> tapering On 3% at 75 -> 40 cc / hr -> 20cc->off Increase topomax to '50mg'$  bid -> d/c PICC 10/18->10/22 removed      ROS:   14 system review of systems performed and negative with exception of those listed in HPI  PMH:  Past Medical  History:  Diagnosis Date   Anemia    Hemorrhagic cerebrovascular accident (CVA) ([place]) 03/25/2020   Seizures ([place])    Thrombosis of superior sagittal sinus 03/25/2020   a.) thrombosis of the posterior aspect of the superior sagittal sinus with hemorrhagic transformation of a venous infarct in the LEFT occipital lobe; (+) vasogenic edema with regional mass effect resulting in LEFT to RIGHT midline shift    PSH:  Past Surgical History:  Procedure Laterality Date   VAGINAL HYSTERECTOMY N/A 01/20/2022   Procedure: HYSTERECTOMY VAGINAL WITH BILATERAL SALPINGECTOMY;  Surgeon: Boykin Nearing, MD;  Location: [facility] ORS;  Service: Gynecology;  Laterality: N/A;    Social History:  Social History  Socioeconomic History   Marital status: Married    Spouse name: Not on file   Number of children: Not on file   Years of education: Not on file   Highest education level: Not on file  Occupational History   Not on file  Tobacco Use   Smoking status: Never   Smokeless tobacco: Never  Vaping Use   Vaping Use: Never used  Substance and Sexual Activity   Alcohol use: Not Currently   Drug use: Never   Sexual activity: Not on file  Other Topics Concern   Not on file  Social History Narrative   Not on file   Social Determinants of Health   Financial Resource Strain: Not on file  Food Insecurity: Not on file  Transportation Needs: Not on file  Physical Activity: Not on file  Stress: Not on file  Social Connections: Not on file  Intimate Partner Violence: Not on file    Family History:  Family History  Problem Relation Age of Onset   Breast cancer Neg Hx     Medications:   Current Outpatient Medications on File Prior to Visit  Medication Sig Dispense Refill   acetaminophen (TYLENOL) 500 MG tablet Take 500 mg by mouth as needed.     aspirin EC 81 MG tablet Take 1 tablet (81 mg total) by mouth daily. Swallow whole. 30 tablet 0   atorvastatin (LIPITOR) 40 MG tablet Take 40 mg by  mouth daily.     levETIRAcetam (KEPPRA) 500 MG tablet Take 2 tablets (1,000 mg total) by mouth every morning AND 3 tablets (1,500 mg total) at bedtime. 450 tablet 3   topiramate (TOPAMAX) 25 MG tablet Take 1 tablet (25 mg total) by mouth 2 (two) times daily. 60 tablet 5   clonazePAM (KLONOPIN) 0.5 MG tablet Take 0.5 mg by mouth 2 (two) times daily as needed for anxiety. (Patient not taking: Reported on 08/31/2022)     Evening Primrose Oil 1000 MG CAPS Take 3,000 mg by mouth daily. (Patient not taking: Reported on 03/08/2022)     No current facility-administered medications on file prior to visit.    Allergies:  No Known Allergies    OBJECTIVE:  Physical Exam  Vitals:   08/31/22 1041  BP: (!) 109/56  Pulse: 66  Weight: 134 lb 3.2 oz (60.9 kg)  Height: '5\' 1"'$  (1.549 m)    Body mass index is 25.36 kg/m. No results found.  General: well developed, well nourished, very pleasant middle-aged Hispanic female, seated, in no evident distress Head: head normocephalic and atraumatic.   Neck: supple with no carotid or supraclavicular bruits Cardiovascular: regular rate and rhythm, no murmurs Musculoskeletal: no deformity Skin:  no rash/petichiae Vascular:  Normal pulses all extremities   Neurologic Exam Mental Status: Awake and fully alert. Limited English primarily Spanish-speaking. Denies dysarthria or aphasia.  Oriented to place and time. Recent and remote memory intact. Attention span, concentration and fund of knowledge appropriate. Mood and affect appropriate.  Cranial Nerves: Pupils equal, briskly reactive to light. Extraocular movements full without nystagmus. Visual fields right homonymous inferior quadrantanopia. Hearing intact. Face, tongue, palate moves normally and symmetrically.  Motor: Normal bulk and tone. Normal strength in all tested extremity muscles. Sensory.: intact to touch , pinprick , position and vibratory sensation Coordination: Rapid alternating movements normal  in all extremities. Finger-to-nose and heel-to-shin performed accurately bilaterally.  No evidence of action or rest tremors or abnormal hand movements Gait and Station: Arises from chair without difficulty. Stance is  normal. Gait demonstrates normal stride length and balance without use of assistive device.  Able to perform tandem walk and heel toe without difficulty.   Reflexes: 1+ and symmetric. Toes downgoing.       ASSESSMENT: Erica Rosario is a 47 y.o. 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 On 04/04/2020 (d/c'd home from [place] on 10/15 in stable condition) with worsening headache, N/V and generalized weakness found to have worsening vasogenic edema in left parieto-occipital region and worsening midline shift of 13 mm requiring reversal of warfarin, mannitol x1, Decadron and hypertonic saline with improvement.  Hospital course complicated by severe anemia, leukocytosis and fever, and elevated liver enzymes. Vascular risk factors include anemia, HLD and OCP use.  Recurrent frontal headaches usually associated with iron deficiency anemia which greatly improved on topiramate.      PLAN:  Sagittal sinus thrombosis w/ hemorrhagic transformation:  Residual deficit: Right inferior homonymous quadrantanopia  MRV 08/24/2020 no evidence of residual thrombosis Continue aspirin 81 mg daily and atorvastatin for secondary stroke prevention.  Discussed secondary stroke prevention measures and importance of close PCP follow up for aggressive stroke risk factor management including HLD with LDL goal<70 Repeat hypercoagulable labs: Slightly elevated anticardiolipin IgM (14) likely clinically insignificant  Right-sided numbness: Has been persistent send  seizure-like activity on 03/02/2022 with some improvement and now more associated with increased exertion. [place] and CTA head/neck 03/02/2022 unremarkable. 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 Does have occasional hand jerking movements but low suspicion seizure related as these last all day and resolve upon awakening usually in setting of increased stressors/anxiety, occurs once every couple of months. Suspect more in setting of anxiety but advised to call if these symptoms should become per persistent Would recommend continuation of current Keppra dosage at this time, '1000mg'$  AM and '1500mg'$  PM (intolerant to '1500mg'$  BID dosing). 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 lasting 5 minutes; prior sz 03/1021 EEG 09/2021 unremarkable Discussed avoiding seizure provoking triggers and ensuring medication compliance  Chronic frontal headaches Greatly improved on topiramate 25 mg twice daily, will continue Use of tylenol as needed, advised to call if this should no longer be beneficial and can consider use of Nurtec or Ubrelvy for rescue, triptans contraindicated due to stroke history    Follow-up in 6 months or call earlier if needed    CC:  Center, [facility]    I spent 36 minutes of face-to-face and non-face-to-face time with patient and interpreter.  This included previsit chart review including review of recent hospitalization, lab review, study review, order entry, electronic health record documentation, patient education and discussion regarding above diagnoses and treatment plan and answered all the questions to patient satisfaction  Frann Rider, [facility]  [facility] [address] [place], [place] [address]  Phone [phone] Fax [phone] Note: This document was prepared with digital dictation and possible smart phrase technology. Any transcriptional errors that result from this process are unintentional.

## 2022-08-31 ENCOUNTER — Encounter: Payer: Self-pay | Admitting: Adult Health

## 2022-08-31 ENCOUNTER — Ambulatory Visit (INDEPENDENT_AMBULATORY_CARE_PROVIDER_SITE_OTHER): Payer: 59 | Admitting: Adult Health

## 2022-08-31 ENCOUNTER — Telehealth: Payer: Self-pay | Admitting: Adult Health

## 2022-08-31 VITALS — BP 109/56 | HR 66 | Ht 61.0 in | Wt 134.2 lb

## 2022-08-31 DIAGNOSIS — G40909 Epilepsy, unspecified, not intractable, without status epilepticus: Secondary | ICD-10-CM | POA: Diagnosis not present

## 2022-08-31 DIAGNOSIS — R200 Anesthesia of skin: Secondary | ICD-10-CM | POA: Diagnosis not present

## 2022-08-31 DIAGNOSIS — R519 Headache, unspecified: Secondary | ICD-10-CM

## 2022-08-31 DIAGNOSIS — I639 Cerebral infarction, unspecified: Secondary | ICD-10-CM | POA: Diagnosis not present

## 2022-08-31 MED ORDER — TOPIRAMATE 25 MG PO TABS: 25.0000 mg | ORAL_TABLET | Freq: Two times a day (BID) | ORAL | 3 refills | Status: DC

## 2022-08-31 NOTE — Patient Instructions (Addendum)
Your Plan:  Continue Keppra '1000mg'$  every morning and '1500mg'$  in the evening for seizure prevention - please ensure you take these medications twice daily without missing any dosages   Continue topamax '25mg'$  twice daily for headache prevention, continue use of tylenol as needed for headaches but if this becomes ineffective, please let me know  Recommend completing MRI brain due to persistent right sided numbness sensation - you will be called to schedule  Continue aspirin and atorvastatin for secondary stroke prevention   Please follow up with your primary doctor regarding joint pain for further evaluation     Follow up in 6 months or call earlier if needed      Thank you for coming to see [place] at [facility]. I hope we have been able to provide you high quality care today.  You may receive a patient satisfaction survey over the next few weeks. We would appreciate your feedback and comments so that we may continue to improve ourselves and the health of our patients.

## 2022-08-31 NOTE — Telephone Encounter (Signed)
sent to GI they obtain Erica Rosario, [place] medicaid [employer]

## 2022-09-02 ENCOUNTER — Encounter: Payer: Self-pay | Admitting: Adult Health

## 2022-09-10 ENCOUNTER — Ambulatory Visit
Admission: RE | Admit: 2022-09-10 | Discharge: 2022-09-10 | Disposition: A | Discharge status: Home / Self Care | Payer: 59 | Source: Ambulatory Visit | Attending: Adult Health | Admitting: Adult Health

## 2022-09-10 DIAGNOSIS — I639 Cerebral infarction, unspecified: Secondary | ICD-10-CM

## 2022-09-10 DIAGNOSIS — R200 Anesthesia of skin: Secondary | ICD-10-CM

## 2022-09-13 ENCOUNTER — Telehealth: Payer: Self-pay

## 2022-09-13 NOTE — Telephone Encounter (Signed)
Contacted pt with assistance from language line. Informed her recent imaging showed stable appearance of prior left parietal/occipital lobe hemorrhagic infarct and no changes since prior imaging 08/2020.  No further workup or imaging needed at this time and to keep FU as scheduled. Advised to call the office back with any questions or concerns as she had none at this time and was appreciative.

## 2022-09-13 NOTE — Telephone Encounter (Signed)
-----   Message from Frann Rider, NP sent at 09/12/2022  7:13 AM EDT ----- Please advise patient that recent imaging showed stable appearance of prior left parietal/occipital lobe hemorrhagic infarct and no changes since prior imaging 08/2020.  No further workup or imaging needed at this time.

## 2022-10-06 ENCOUNTER — Other Ambulatory Visit: Payer: Self-pay | Admitting: Family Medicine

## 2022-10-06 DIAGNOSIS — Z1231 Encounter for screening mammogram for malignant neoplasm of breast: Secondary | ICD-10-CM

## 2022-11-09 ENCOUNTER — Ambulatory Visit
Admission: RE | Admit: 2022-11-09 | Discharge: 2022-11-09 | Disposition: A | Discharge status: Home / Self Care | Payer: 59 | Source: Ambulatory Visit | Attending: Family Medicine | Admitting: Family Medicine

## 2022-11-09 DIAGNOSIS — Z1231 Encounter for screening mammogram for malignant neoplasm of breast: Secondary | ICD-10-CM | POA: Insufficient documentation

## 2023-03-01 ENCOUNTER — Telehealth: Payer: Self-pay | Admitting: Adult Health

## 2023-03-01 DIAGNOSIS — G40909 Epilepsy, unspecified, not intractable, without status epilepticus: Secondary | ICD-10-CM

## 2023-03-01 MED ORDER — LEVETIRACETAM 500 MG PO TABS: ORAL_TABLET | ORAL | 0 refills | Status: DC

## 2023-03-01 NOTE — Telephone Encounter (Signed)
Pt is requesting a refill for levETIRAcetam (KEPPRA) 500 MG tablet .  Pharmacy: [facility]

## 2023-03-01 NOTE — Telephone Encounter (Signed)
Refill sent for the patient for 3 month supply. Must keep upcoming apt scheduled next week

## 2023-03-07 NOTE — Progress Notes (Unsigned)
[facility] [address] [place]. [place] [address] [phone]       OFFICE FOLLOW UP NOTE  Erica Rosario Date of Birth:  01/23/1976 Medical Record Number:  [id number]   Reason for visit: Seizure and stroke follow-up    SUBJECTIVE:   CHIEF COMPLAINT:  No chief complaint on file.    HPI:   Update 03/08/2023 JM: Patient returns for follow-up visit.  Accompanied by [facility] health interpreter.  No recent seizure activity on Keppra 1000/1500, no side effects.  Remains on topiramate 25 mg twice daily for headache prophylaxis ***  Right sided numbness *** MRI brain 08/2022 without new findings, noted sequela of chronic microhemorrhage in left parietal/occipital lobe without change  Denies new stroke/TIA symptoms.  Stable right peripheral visual impairment.  Compliant on aspirin and atorvastatin.  Routinely follows with PCP for stroke risk factor management.        History provided for reference purposes only Update 08/31/2022 JM: Patient returns for 65-month follow-up.  She is accompanied by [facility] health interpreter.    Denies any recurrent seizure activity, remains on Keppra 1000/1500, denies side effects.  She occasionally continues to miss dosages but has been working on trying to be more diligent about this.  She reports continued right-sided numbness especially with increased exertion and will improve after resting.  At times her right hand will become numb and start to shake, can last all day. No always associated with full side right-sided numbness and denies any association with headaches. Usually in setting of increased stressors. Also mentions low back pain but denies radiating symptoms, also notes pain in both hands. Plans on scheduling f/u visit with PCP to further discuss.   Headaches have improved since prior visit, having about 1 headache every 2 months. Continues on topamax 25mg  twice daily, tolerating well. Can have headaches if thinking  too much or trying to concentrate. Will use tylenol with benefit.   Denies any new stroke/TIA symptoms.  Continued right peripheral impairment, stable since prior visit. Compliant on aspirin and atorvastatin.  Blood pressure well-controlled.  Routinely follows with PCP.  Update 03/07/2022 JM: Patient returns for acute visit due to recent possible seizure.  She is accompanied by [facility] health interpreter.  She presented to [facility] ED on 9/13 complaining of episode of distress, describing it as a rising sensation in her stomach followed by a feeling of "despair" as well as weakness and numbness in both arms and legs but difficulty fully describing, while in ED multiple episodes where she became quite distressed and she would hold her right leg flexed, could be easily repositioned and able to move it voluntarily without increased tone during episode.  Evaluated by neurologist Dr. Amada Jupiter with symptoms consistent with epigastric rising/sense of impending doom concerning a partial seizure however multiple findings on exam which could be suspicious for nonorganic etiology of these events.  CT head and CTA head/neck unremarkable.  She did receive 2 mg of IV Ativan and Keppra load and remained episode free for multiple hours.  Did miss a dose of her medication that morning and due to difficulty tolerating higher dosage, recommended continuation of current Keppra dosage.  She does report continued right sided numbness which has been persistent since she was in the hospital (describes complete numbness side of face, arm and leg). Has improved some since discharge. She has not had any additional seizure like activity since discharge. She does confirm she did miss her morning dose of keppra prior to her seizure.  She has not missed any dosages since recent ED visit.  She does continue to experience headaches which was discussed at prior visit, did obtain lab work which did not show any signs of anemia (headaches previously  present while anemic). She is interested in starting prophylactic therapy.  Previously taking topiramate with benefit.  Remains on aspirin and atorvastatin.  Closely followed by PCP.  No further concerns at this time.   Update 03/01/2022 JM: Patient returns for 13-month stroke and seizure follow-up accompanied by [facility] health interpreter.  Overall stable without any reoccurring seizure activity or new stroke/TIA symptoms.  She does complain of recurrence of typical frontal headaches which have worsened over the past 4 months.  Previously associated with iron deficiency anemia.  Does report having issues with heavy menstrual cycles over the past few months and ended up undergoing hysterectomy on 8/3.  She did receive iron infusion back in July, has not had repeat lab work since that time.  She is scheduled to follow-up with OB next week.  Compliant on Keppra 1000/1500, denies side effects.  Compliant on aspirin and atorvastatin, denies side effects.  Blood pressure today 110/60.  No further concerns at this time  Update 09/14/2021 JM: Patient returns for acute visit due to recent seizure activity.  She is accompanied by her sister and [facility] interpreter.  She presented to [facility] ED on 09/10/2021 for a witnessed generalized tonic-clonic seizure lasting approximately 5 minutes.  She was confused and postictal upon arrival.  She had additional tonic-clonic seizure in ED lasting approximately 1 to 2 minutes, received IV Ativan and Keppra load.  Lab work largely within normal limits and CTH no acute abnormality.  Due to likely unprovoked seizure, Keppra dosage increased to 1500 mg twice daily.  Since discharge, she has been doing well without any reoccurring seizure activity.  Remains on Keppra 1500 mg twice daily but does complain on dizziness and nauseous since starting which was a prior compliant on higher keppra dosage. Prior to seizure onset, denies any missed dosages, significantly elevated stress levels,  recent medication changes or any potential seizure provoking triggers.   Update 07/26/2021 JM: Returns for follow-up visit after prior visit 4 months ago with Dr. Pearlean Brownie for recent breakthrough seizure and hx of stroke. She is accompanied by a friend and [facility] interpreter.  Overall stable since prior visit without any additional seizure activity, compliant on Keppra 500/1000mg  without side effects (prior difficulty tolerating 1000mg  BID dosing which improved after dose adjustment). EEG 04/2021 unremarkable without evidence of seizures.  Stable from stroke standpoint without new stroke/TIA symptoms and stable residual deficit of right peripheral visual impairment.  Compliant on aspirin and atorvastatin without side effects.  Blood pressure today 106/72. At prior visit, topamax discontinued as no headaches but shortly after stopping, expericing reoccurring headaches - PCP restarted Topamax, headaches stable but can occur with increased stress. Recently started on venlafaxine for premenopausal symptoms.  No new concerns at this time.   Update 04/07/2021 Dr. Pearlean Brownie Patient returns for follow-up after last visit 2 months ago.  She is accompanied by brother and Spanish language interpreter was present throughout this visit.  Patient was seen in the ER on 04/03/2021 with breakthrough seizures.  She presented with visual problems which was similar to what she had during her previous episode of venous sinus thrombosis and subsequently went on to have multiple seizures.  Patient denied missing any recent doses of medicines.  She was loaded with 2 g of Keppra and  Ativan 2 mg.  Her maintenance dose of Keppra was increased to 1 g twice daily.  Patient states she does not remember the episode of the hospital visit.  She states she is done well since then and has not had any further recurrent seizures but she complains of feeling dizzy and having nausea and trouble tolerating the higher dose.  She did also have CT scan of  the head as well as CT angiogram of the neck and brain both of which were unremarkable on 04/03/2021.  Patient denies significant headaches or numbness but she is on Topamax 25 mg twice daily and is wondering if she can stop it.  She has no new complaints today.  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  Update 01/18/2021 JM: Erica Rosario returns for 88-month stroke follow-up accompanied by sister and [facility] interpreter.  She was evaluated at [facility] ED 7/29 for dizziness and occipital and frontal headache associated with bright lights in right eye over the past week.  Seen in ED for similar symptoms on 3/7 and diagnosed with iron deficiency anemia possibly in setting of heavy menstrual cycle.  Started menstrual cycle on 7/23 but symptoms much worse compared to prior symptoms. Dizziness improved after meclizine and received IM Reglan for headache.  CT head unremarkable for acute findings.  Lab work unremarkable.  Symptoms have since resolved.  Is scheduled for IUD implant on 8/8 due to continued heavy irregular menstrual cycles.  Of note, she did have IUD placed 7/12 to help control menstrual cycles with heavy bleeding.  She describes the dizziness as a lightheaded feeling.  She has had vertigo in the past and reports different dizziness symptoms associated with menstrual cycle.  Residual stroke deficit right peripheral visual impairment stable.  Denies any other new stroke/TIA symptoms. Compliant on warfarin with reported heavy menstrual cycle since starting and atorvastatin without associated side effects. Blood pressure today 110/66. Reports complaince on keppra 500mg  BID without associated side effects and denies any seizure activity.  No further concerns at this time.  Update 09/09/2020 JM: Erica Rosario returns for stroke follow-up after prior visit approximately 4 months ago accompanied by  interpreter.   Residual deficits of mild right peripheral visual impairment but is improving.  She denies residual right hand numbness Evaluated in the ED 08/24/2020 with headache, dizziness and visual complaints. MRI and MRV unremarkable for acute findings.  Noted to be anemic with low iron levels which was felt possibly related to heavy menstrual cycles. Headache and dizziness have since resolved. She has since f/u with OB/GYN with plans on placing IUD to help control menstrual cycles.  Also follow-up with PCP who initiated ferrous sulfate. Has f/u next week for repeat lab work Denies new stroke/TIA symptoms  reports bilateral hand finger joint pain with increased stiffness and quick lasting "tremor" with extension of the fingers upon awakening which has been present over the past month.  Denies any weakness, pain in other locations such as wrist or arm or numbness/tingling  Continues on Keppra 500 mg twice daily -tolerating denies any recent seizure activity  Remains on warfarin with INR levels monitored by PCP- per patient, INR level stable - unable to view via epic - aside from heavy menstrual cycles, no other bleeding or bruising concerns Remains on atorvastatin -denies associated side effects  Blood pressure today 110/67 - does not routinely monitor at  home  No further concerns at this time  Initial visit 05/12/2020 JM: Erica Rosario is being seen for hospital follow-up accompanied by her brother and interpreter. She reports she has been improving but continues to have right hand numbness, right peripheral visual impairment and gait impairment due to visual difficulty. She has not done any therapies since discharge due to lack of insurance.  She is in the process of applying for [employer] financial assistance as well as other insurance..  Denies new or worsening stroke/TIA symptoms. 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  She lives with her  husband and 2 sons able to maintain ADLs independently and has been slowly returning to completing IADLs.  She continues on Keppra 500 mg twice daily tolerating well and denies any reoccurring seizure activity.  She remains on warfarin without bleeding or bruising with INR levels monitored by PCP weekly. Per patient, INR levels have been greater than 3 so currently doing dosage adjustments per PCP. Remains on atorvastatin without myalgias. Blood pressure today 114/68 - not routinely monitored at home.  She has since discontinued OCP.  No further concerns at this time.  Stroke admission 03/25/2020 JM: Erica Rosario is a 47 y.o. female with no PMH  who presented to [facility] with HA and neck stiffness on 03/25/2020.   Personally reviewed hospitalization pertinent progress notes, lab work and imaging with summary provided.  Shortly transferred to [facility] and evaluated by Dr. 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  Repeat CT head showed hemorrhagic transformation L parietal with slight increase edema with most hemorrhagic foci unchanged and single new hemorrhage at vertex.  Repeat CT head 3 hours later stable. Pan CT negative for malignancy but did show B renal vein nonocclusive thrombosis. CTV and CTA h/n negative LVO with partial thrombosis of the superior sagittal sinus at the vertex, nonocclusive as well can be demonstrated around the margins of the thrombus with patency beyond that.  Also showed thrombosed superficial draining vein of the left again visible, similar in appearance with low density edema slightly increased and mild swelling with mass-effect and 1 to 2 mm of left-to-right shift and flattening of the left lateral ventricle.  Questionable hypercoagulable state with hypercoagulable showed anticardiolipin IgM 19 (indeterminate) and protein S  total and active mildly low but likely inaccurate on heparin.  Advise to discontinue OCP use.  Placed on heparin IV and warfarin daily. GTC in ER s/p Ativan and Keppra load and initiated Keppra 500 mg twice daily with LTM showing left frontal temporal continuous slowing and bilateral parieto-occipital spikes.  LDL UTC with direct LDL of 151 no statin PTA and initiated atorvastatin 40 mg daily.  No prior history of HTN or DM.  Other stroke risk factors include history of EtOH use and overweight but no prior stroke history.  Other active problems include anemia, leukocytosis and hypokalemia.  Evaluated by therapies and recommended discharge to CIR for ongoing therapy needs.  She was discharged to CIR on 03/31/2020 and discharged home on 04/03/2020 on Keppra, Topamax and warfarin.    She unfortunately returned on 04/04/2020 with complaint of headache, generalized weakness and "faintness" as well as nauseated and several episodes of emesis but no focal deficits noted.  She was found to have worsening vasogenic edema in left parieto-occipital region and worsening midline shift of  13 mm.  Warfarin discontinued and reversed with vitamin K.  Administering mannitolx1, Decadron, hypertonic saline and heparin drip.  Repeat CT head showed improved MLS, cerebral edema and decreased hydrocephalus and again repeat CT head showed stable edema with decreasing IPH and same 1 cm midline shift. NSG on board PRN.  Also found to have severe anemia receiving 2U PRBC with improvement potentially contributing to syncopal event on 10/15 at home.  Elevated liver enzymes of unclear etiology possibly related to Topamax therefore DC'd.  Restarted warfarin with INR goal 2-3 in addition to Lovenox for total of 4 doses.  Advised to follow-up with [facility] on 10/25 for pro time blood test and CBC as well as INR levels and adjustment of warfarin as indicated and possible discontinuation of Lovenox.  Discharged on  dexamethasone.  Reevaluated by therapy recommended outpatient OT and no PT needs.  She was discharged home on 04/11/2020.   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 Also thrombosis draining cortical vein. 2 areas enhancement in L occipital hemorrhage possible ongoing hemorrhage. Similar edema w/ slight R shift. CTA and CTV 10/9 - Partial thrombosis of the superior sagittal sinus at the vertex, Thrombosed superficial draining vein on the left, Hemorrhagic infarction in the left parietal cortical and subcortical brain  Pan CT neg for malignancy, B renal vein nonocclusive thrombosis. CTV 10/16 Decreased volume of nonocclusive superior sagittal sinus Thrombus. Decreased conspicuity of thrombus within an adjacent left cortical vein. Repeat CT head improved MLS, cerebral edema and decreased hydrocephalus  2D Echo EF 60-65%. No source of embolus  LE dopplers 10/19 - no DVT  LDL UTC, TC 210, TG 132, direct LDL 151.9 HgbA1c 5.6 UDS +benzos Hypercoagulable labs unremarkable (anticardiolipin IgM slightly elevated, prot S total and activity mildly low - not accurate on heparin) VTE prophylaxis - heparin IV   No antithrombotic prior to admission, now on full dose lovenox. On coumadin with INR goal 2-3.  Today INR 1.5 Decadron 4mg  IV q6h ->2mg  IV q 6h -> 2mg  po Q6h -> 2mg  Q8  Therapy recommendations:  Outpt OT, no PT - gave pt info for pro bono therapies at [facility] Disposition:  home  Cerebral Edema CT head Left parieto-occipital venous infarct with increased edema and mass effect including 13 mm of rightward midline shift. New mild dilatation of the right lateral ventricle suggesting early trapping Repeat CT  improved MLS, cerebral edema and decreased hydrocephalus CT repeat stable edema, decreasing IPH. Same 1cm midline shift  NSG on board PRN S/p mannitol x 1 Decadron 10mg  x 1 -> 4mg  Q6 -> 2mg  Q6 ->2mg  Q8 -> tapering On 3% at 75 -> 40 cc / hr -> 20cc->off Increase topomax to 50mg  bid -> d/c PICC 10/18->10/22 removed      ROS:   14 system review of systems performed and negative with exception of those listed in HPI  PMH:  Past Medical History:  Diagnosis Date   Anemia    Hemorrhagic cerebrovascular accident (CVA) (HCC) 03/25/2020   Seizures (HCC)    Thrombosis of superior sagittal sinus 03/25/2020   a.) thrombosis of the posterior aspect of the superior sagittal sinus with hemorrhagic transformation of a venous infarct in the LEFT occipital lobe; (+) vasogenic edema  with regional mass effect resulting in LEFT to RIGHT midline shift    PSH:  Past Surgical History:  Procedure Laterality Date   VAGINAL HYSTERECTOMY N/A 01/20/2022   Procedure: HYSTERECTOMY VAGINAL WITH BILATERAL SALPINGECTOMY;  Surgeon: Schermerhorn, Ihor Austin, MD;  Location: [facility] ORS;  Service: Gynecology;  Laterality: N/A;    Social History:  Social History   Socioeconomic History   Marital status: Married    Spouse name: Not on file   Number of children: Not on file   Years of education: Not on file   Highest education level: Not on file  Occupational History   Not on file  Tobacco Use   Smoking status: Never   Smokeless tobacco: Never  Vaping Use   Vaping status: Never Used  Substance and Sexual Activity   Alcohol use: Not Currently   Drug use: Never   Sexual activity: Not on file  Other Topics Concern   Not on file  Social History Narrative   Not on file   Social Determinants of Health   Financial Resource Strain: Not on file  Food Insecurity: Not on file  Transportation Needs: Not on file  Physical Activity: Not on file  Stress: Not on file  Social Connections: Not on file  Intimate  Partner Violence: Not on file    Family History:  Family History  Problem Relation Age of Onset   Breast cancer Neg Hx     Medications:   Current Outpatient Medications on File Prior to Visit  Medication Sig Dispense Refill   acetaminophen (TYLENOL) 500 MG tablet Take 500 mg by mouth as needed.     aspirin EC 81 MG tablet Take 1 tablet (81 mg total) by mouth daily. Swallow whole. 30 tablet 0   atorvastatin (LIPITOR) 40 MG tablet Take 40 mg by mouth daily.     levETIRAcetam (KEPPRA) 500 MG tablet Take 2 tablets (1,000 mg total) by mouth every morning AND 3 tablets (1,500 mg total) at bedtime. 450 tablet 0   topiramate (TOPAMAX) 25 MG tablet Take 1 tablet (25 mg total) by mouth 2 (two) times daily. 180 tablet 3   No current facility-administered medications on file prior to visit.    Allergies:  No Known Allergies    OBJECTIVE:  Physical Exam  There were no vitals filed for this visit.   There is no height or weight on file to calculate BMI. No results found.  General: well developed, well nourished, very pleasant middle-aged Hispanic female, seated, in no evident distress Head: head normocephalic and atraumatic.   Neck: supple with no carotid or supraclavicular bruits Cardiovascular: regular rate and rhythm, no murmurs Musculoskeletal: no deformity Skin:  no rash/petichiae Vascular:  Normal pulses all extremities   Neurologic Exam Mental Status: Awake and fully alert. Limited English primarily Spanish-speaking. Denies dysarthria or aphasia.  Oriented to place and time. Recent and remote memory intact. Attention span, concentration and fund of knowledge appropriate. Mood and affect appropriate.  Cranial Nerves: Pupils equal, briskly reactive to light. Extraocular movements full without nystagmus. Visual fields right homonymous inferior quadrantanopia. Hearing intact. Face, tongue, palate moves normally and symmetrically.  Motor: Normal bulk and tone. Normal strength in all  tested extremity muscles. Sensory.: intact to touch , pinprick , position and vibratory sensation Coordination: Rapid alternating movements normal in all extremities. Finger-to-nose and heel-to-shin performed accurately bilaterally.  No evidence of action or rest tremors or abnormal hand movements Gait and Station: Arises from chair without difficulty. Stance is normal.  Gait demonstrates normal stride length and balance without use of assistive device.  Able to perform tandem walk and heel toe without difficulty.   Reflexes: 1+ and symmetric. Toes downgoing.       ASSESSMENT: Erica Rosario is a 47 y.o. 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 On 04/04/2020 (d/c'd home from CIR on 10/15 in stable condition) with worsening headache, N/V and generalized weakness found to have worsening vasogenic edema in left parieto-occipital region and worsening midline shift of 13 mm requiring reversal of warfarin, mannitol x1, Decadron and hypertonic saline with improvement.  Hospital course complicated by severe anemia, leukocytosis and fever, and elevated liver enzymes. Vascular risk factors include anemia, HLD and OCP use.  Recurrent frontal headaches usually associated with iron deficiency anemia which greatly improved on topiramate.      PLAN:  Sagittal sinus thrombosis w/ hemorrhagic transformation:  Residual deficit: Right inferior homonymous quadrantanopia  MRV 08/24/2020 no evidence of residual thrombosis Continue aspirin 81 mg daily and atorvastatin for secondary stroke prevention.  Discussed secondary stroke prevention measures and importance of close PCP follow up for aggressive stroke risk factor management including HLD with LDL goal<70 Repeat  hypercoagulable labs: Slightly elevated anticardiolipin IgM (14) likely clinically insignificant  Right-sided numbness: Has been persistent since seizure-like activity on 03/02/2022 with some improvement and now more associated with increased exertion. CTH and CTA head/neck 03/02/2022 unremarkable. 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 Does have occasional hand jerking movements but low suspicion seizure related as these last all day and resolve upon awakening usually in setting of increased stressors/anxiety, occurs once every couple of months. Suspect more in setting of anxiety but advised to call if these symptoms should become per persistent Would recommend continuation of current Keppra dosage at this time, 1000mg  AM and 1500mg  PM (intolerant to 1500mg  BID dosing). 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 lasting 5 minutes; prior sz 03/1021 EEG 09/2021 unremarkable Discussed avoiding seizure provoking triggers and ensuring medication compliance  Chronic frontal headaches Greatly improved on topiramate 25 mg twice daily, will continue Use of tylenol as needed, advised to call if this should no longer be beneficial and can consider use of Nurtec or Ubrelvy for rescue, triptans contraindicated due to stroke history    Follow-up in 6 months or call earlier if needed    CC:  Hillery Aldo, MD    I spent 36 minutes of face-to-face and non-face-to-face time with patient and interpreter.  This included previsit chart review, lab review, study review, order entry, electronic health record documentation, patient education and discussion regarding  above diagnoses and treatment plan and answered all the questions to patient satisfaction  Ihor Austin, [facility]  [facility] [address] [place], [place] [address]  Phone [phone] Fax [phone] Note: This document was prepared with digital dictation and possible smart phrase technology. Any transcriptional errors that result from this process are unintentional.

## 2023-03-08 ENCOUNTER — Ambulatory Visit (INDEPENDENT_AMBULATORY_CARE_PROVIDER_SITE_OTHER): Payer: 59 | Admitting: Adult Health

## 2023-03-08 ENCOUNTER — Encounter: Payer: Self-pay | Admitting: Adult Health

## 2023-03-08 VITALS — BP 108/66 | HR 59 | Ht 64.8 in | Wt 134.8 lb

## 2023-03-08 DIAGNOSIS — R569 Unspecified convulsions: Secondary | ICD-10-CM | POA: Diagnosis not present

## 2023-03-08 DIAGNOSIS — R519 Headache, unspecified: Secondary | ICD-10-CM

## 2023-03-08 DIAGNOSIS — I639 Cerebral infarction, unspecified: Secondary | ICD-10-CM

## 2023-03-08 MED ORDER — LEVETIRACETAM 500 MG PO TABS: ORAL_TABLET | ORAL | 3 refills | Status: DC

## 2023-03-08 MED ORDER — TOPIRAMATE 25 MG PO TABS: 25.0000 mg | ORAL_TABLET | Freq: Two times a day (BID) | ORAL | 3 refills | Status: DC

## 2023-03-08 NOTE — Patient Instructions (Addendum)
Continue keppra and topamax at current dosages - refills provided  Please call with any seizure activity or worsening headaches   Continue aspirin 81 mg daily  and atorvastatin  for secondary stroke prevention  Continue to follow up with PCP regarding blood pressure and cholesterol management  Maintain strict control of hypertension with blood pressure goal below 130/90 and cholesterol with LDL cholesterol (bad cholesterol) goal below 70 mg/dL.   Signs of a Stroke? Follow the BEFAST method:  Balance Watch for a sudden loss of balance, trouble with coordination or vertigo Eyes Is there a sudden loss of vision in one or both eyes? Or double vision?  Face: Ask the person to smile. Does one side of the face droop or is it numb?  Arms: Ask the person to raise both arms. Does one arm drift downward? Is there weakness or numbness of a leg? Speech: Ask the person to repeat a simple phrase. Does the speech sound slurred/strange? Is the person confused ? Time: If you observe any of these signs, call 911.     Followup in the future with me in 1 year or call earlier if needed       Thank you for coming to see [place] at [facility]. I hope we have been able to provide you high quality care today.  You may receive a patient satisfaction survey over the next few weeks. We would appreciate your feedback and comments so that we may continue to improve ourselves and the health of our patients.

## 2023-03-21 ENCOUNTER — Other Ambulatory Visit: Payer: Self-pay

## 2023-03-21 ENCOUNTER — Encounter: Payer: Self-pay | Admitting: Intensive Care

## 2023-03-21 ENCOUNTER — Observation Stay: Payer: 59

## 2023-03-21 ENCOUNTER — Emergency Department: Payer: 59

## 2023-03-21 ENCOUNTER — Observation Stay
Admission: EM | Admit: 2023-03-21 | Discharge: 2023-03-22 | Disposition: A | Discharge status: Home / Self Care | Payer: 59 | Attending: Internal Medicine | Admitting: Internal Medicine

## 2023-03-21 DIAGNOSIS — Z8673 Personal history of transient ischemic attack (TIA), and cerebral infarction without residual deficits: Secondary | ICD-10-CM | POA: Insufficient documentation

## 2023-03-21 DIAGNOSIS — R200 Anesthesia of skin: Secondary | ICD-10-CM

## 2023-03-21 DIAGNOSIS — Z86718 Personal history of other venous thrombosis and embolism: Secondary | ICD-10-CM

## 2023-03-21 DIAGNOSIS — D649 Anemia, unspecified: Secondary | ICD-10-CM | POA: Diagnosis not present

## 2023-03-21 DIAGNOSIS — R202 Paresthesia of skin: Secondary | ICD-10-CM | POA: Diagnosis present

## 2023-03-21 DIAGNOSIS — R29818 Other symptoms and signs involving the nervous system: Secondary | ICD-10-CM | POA: Diagnosis not present

## 2023-03-21 DIAGNOSIS — G40909 Epilepsy, unspecified, not intractable, without status epilepticus: Secondary | ICD-10-CM

## 2023-03-21 DIAGNOSIS — Z7982 Long term (current) use of aspirin: Secondary | ICD-10-CM | POA: Diagnosis not present

## 2023-03-21 HISTORY — DX: Cerebral infarction, unspecified: I63.9

## 2023-03-21 LAB — DIFFERENTIAL
Abs Immature Granulocytes: 0.03 10*3/uL (ref 0.00–0.07)
Basophils Absolute: 0 10*3/uL (ref 0.0–0.1)
Basophils Relative: 0 %
Eosinophils Absolute: 0.1 10*3/uL (ref 0.0–0.5)
Eosinophils Relative: 1 %
Immature Granulocytes: 1 %
Lymphocytes Relative: 33 %
Lymphs Abs: 1.9 10*3/uL (ref 0.7–4.0)
Monocytes Absolute: 0.5 10*3/uL (ref 0.1–1.0)
Monocytes Relative: 9 %
Neutro Abs: 3.2 10*3/uL (ref 1.7–7.7)
Neutrophils Relative %: 56 %

## 2023-03-21 LAB — COMPREHENSIVE METABOLIC PANEL
ALT: 27 U/L (ref 0–44)
AST: 26 U/L (ref 15–41)
Albumin: 4.1 g/dL (ref 3.5–5.0)
Alkaline Phosphatase: 77 U/L (ref 38–126)
Anion gap: 9 (ref 5–15)
BUN: 15 mg/dL (ref 6–20)
CO2: 22 mmol/L (ref 22–32)
Calcium: 8.7 mg/dL — ABNORMAL LOW (ref 8.9–10.3)
Chloride: 106 mmol/L (ref 98–111)
Creatinine, Ser: 0.8 mg/dL (ref 0.44–1.00)
GFR, Estimated: >60 mL/min (ref >60)
Glucose, Bld: 105 mg/dL — ABNORMAL HIGH (ref 70–99)
Potassium: 3.3 mmol/L — ABNORMAL LOW (ref 3.5–5.1)
Sodium: 137 mmol/L (ref 135–145)
Total Bilirubin: 0.5 mg/dL (ref 0.3–1.2)
Total Protein: 7.7 g/dL (ref 6.5–8.1)

## 2023-03-21 LAB — APTT: aPTT: 28 s (ref 24–36)

## 2023-03-21 LAB — CBC
HCT: 40.2 % (ref 36.0–46.0)
Hemoglobin: 13.5 g/dL (ref 12.0–15.0)
MCH: 32.7 pg (ref 26.0–34.0)
MCHC: 33.6 g/dL (ref 30.0–36.0)
MCV: 97.3 fL (ref 80.0–100.0)
Platelets: 202 10*3/uL (ref 150–400)
RBC: 4.13 MIL/uL (ref 3.87–5.11)
RDW: 12.2 % (ref 11.5–15.5)
WBC: 5.7 10*3/uL (ref 4.0–10.5)
nRBC: 0 % (ref 0.0–0.2)

## 2023-03-21 LAB — PROTIME-INR
INR: 1 (ref 0.8–1.2)
Prothrombin Time: 13.7 s (ref 11.4–15.2)

## 2023-03-21 LAB — ETHANOL: Alcohol, Ethyl (B): <10 mg/dL (ref <10)

## 2023-03-21 MED ORDER — LORAZEPAM 2 MG/ML IJ SOLN: 1.0000 mg | INTRAMUSCULAR | Status: DC | PRN

## 2023-03-21 MED ORDER — CLOPIDOGREL BISULFATE 75 MG PO TABS
75.0000 mg | ORAL_TABLET | Freq: Every day | ORAL | Status: DC
  Administered 2023-03-22: 75 mg via ORAL
  Filled 2023-03-21: qty 1

## 2023-03-21 MED ORDER — ORAL CARE MOUTH RINSE: 15.0000 mL | OROMUCOSAL | Status: DC | PRN

## 2023-03-21 MED ORDER — ENOXAPARIN SODIUM 40 MG/0.4ML IJ SOSY
40.0000 mg | PREFILLED_SYRINGE | INTRAMUSCULAR | Status: DC
  Administered 2023-03-22: 40 mg via SUBCUTANEOUS
  Filled 2023-03-21: qty 0.4

## 2023-03-21 MED ORDER — ATORVASTATIN CALCIUM 20 MG PO TABS
40.0000 mg | ORAL_TABLET | Freq: Every day | ORAL | Status: DC
  Administered 2023-03-22: 40 mg via ORAL
  Filled 2023-03-21: qty 2

## 2023-03-21 MED ORDER — SODIUM CHLORIDE 0.9 % IV SOLN
75.0000 mL/h | INTRAVENOUS | Status: DC
  Administered 2023-03-22: 75 mL/h via INTRAVENOUS

## 2023-03-21 MED ORDER — TOPIRAMATE 25 MG PO TABS
50.0000 mg | ORAL_TABLET | Freq: Two times a day (BID) | ORAL | Status: DC
  Administered 2023-03-22: 50 mg via ORAL
  Filled 2023-03-21: qty 2

## 2023-03-21 MED ORDER — ONDANSETRON HCL 4 MG PO TABS: 4.0000 mg | ORAL_TABLET | Freq: Four times a day (QID) | ORAL | Status: DC | PRN

## 2023-03-21 MED ORDER — STROKE: EARLY STAGES OF RECOVERY BOOK: Freq: Once | Status: DC

## 2023-03-21 MED ORDER — POTASSIUM CHLORIDE CRYS ER 20 MEQ PO TBCR
40.0000 meq | EXTENDED_RELEASE_TABLET | Freq: Once | ORAL | Status: AC
  Administered 2023-03-21: 40 meq via ORAL
  Filled 2023-03-21: qty 2

## 2023-03-21 MED ORDER — TOPIRAMATE 25 MG PO TABS: 25.0000 mg | ORAL_TABLET | ORAL | Status: DC

## 2023-03-21 MED ORDER — ACETAMINOPHEN 325 MG PO TABS: 650.0000 mg | ORAL_TABLET | ORAL | Status: DC | PRN

## 2023-03-21 MED ORDER — ACETAMINOPHEN 650 MG RE SUPP: 650.0000 mg | RECTAL | Status: DC | PRN

## 2023-03-21 MED ORDER — ASPIRIN 81 MG PO CHEW
324.0000 mg | CHEWABLE_TABLET | Freq: Once | ORAL | Status: AC
  Administered 2023-03-21: 324 mg via ORAL
  Filled 2023-03-21: qty 4

## 2023-03-21 MED ORDER — ACETAMINOPHEN 160 MG/5ML PO SOLN: 650.0000 mg | ORAL | Status: DC | PRN

## 2023-03-21 MED ORDER — ONDANSETRON HCL 4 MG/2ML IJ SOLN: 4.0000 mg | Freq: Four times a day (QID) | INTRAMUSCULAR | Status: DC | PRN

## 2023-03-21 MED ORDER — ORAL CARE MOUTH RINSE
15.0000 mL | OROMUCOSAL | Status: DC
  Administered 2023-03-22 (×2): 15 mL via OROMUCOSAL
  Filled 2023-03-21 (×11): qty 15

## 2023-03-21 MED ORDER — LORAZEPAM 2 MG/ML IJ SOLN
0.5000 mg | Freq: Once | INTRAMUSCULAR | Status: AC
  Administered 2023-03-21: 0.5 mg via INTRAVENOUS
  Filled 2023-03-21: qty 1

## 2023-03-21 MED ORDER — ASPIRIN 81 MG PO TBEC
81.0000 mg | DELAYED_RELEASE_TABLET | Freq: Every day | ORAL | Status: DC
  Administered 2023-03-22: 81 mg via ORAL
  Filled 2023-03-21: qty 1

## 2023-03-21 MED ORDER — SODIUM CHLORIDE 0.9% FLUSH
3.0000 mL | Freq: Once | INTRAVENOUS | Status: AC
  Administered 2023-03-21: 3 mL via INTRAVENOUS

## 2023-03-21 MED ORDER — IOHEXOL 350 MG/ML SOLN
75.0000 mL | Freq: Once | INTRAVENOUS | Status: AC | PRN
  Administered 2023-03-21: 75 mL via INTRAVENOUS

## 2023-03-21 NOTE — Progress Notes (Signed)
Code stroke activated @ 1811 with the patient in CT.  LKWT 1700.  Headache, confusion and right sided numbness.  Hx of seizures, compliant with meds per patient.  Dr. Amada Jupiter paged @ [phone] and on camera @ 1821.  Josiah Lobo BSN, Occupational hygienist

## 2023-03-21 NOTE — ED Notes (Signed)
CODE STROKE called to [facility]

## 2023-03-21 NOTE — Assessment & Plan Note (Addendum)
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  CT and CTA negative Will still get stroke workup with MRI, continuous cardiac monitoring and care echo Permissive hypertension for first 24-48 hrs post stroke onset: Prn Labetalol IV or Vasotec IV If BP greater than 220/120  Statins for LDL goal less than 70 ASA 81mg  daily, Plavix 75mg  daily x 3 weeks then monotherapy thereafter if evidence of stroke on MRI Avoid dextrose containing fluids, Maintain euglycemia, euthermia Neuro checks q4 hrs x 24 hrs and then per shift Head of bed 30 degrees Physical therapy/Occupational therapy/Speech therapy if failed dysphagia screen Neurology consult to follow Addendum:MRI-->No acute intracranial abnormality. 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

## 2023-03-21 NOTE — ED Notes (Signed)
Pt assisted to toilet in rm to urinate. Pt ambulatory back to bed with assistance from this tech. Pt placed back on cardiac monitor, pt changed into a hospital gown at this time, warm blankets given to pt, and call light within reach. Pt has no further needs at this time.

## 2023-03-21 NOTE — ED Notes (Addendum)
Swallow screen passed. Patient now taking home med_ Keppra 1500 mg and topiramate 25mg  per provider Quale,MD.

## 2023-03-21 NOTE — ED Provider Notes (Signed)
[facility] Provider Note    Event Date/Time   First MD Initiated Contact with Patient 03/21/23 1813     (approximate)   History   Numbness  Spanish interpreter [place] utilized  HPI  Erica Rosario is a 47 y.o. female with a history of seizures, hemorrhagic infarct, mild right-sided weakness  Around 5 PM patient started to experience a pressure type discomfort reports is not really pain and slight feeling of weakness in her right arm and right leg.  She reports that she has had this before and generally caused by mild "seizures"  Currently the symptoms have gone away.  She is compliant with her Keppra and Topamax but has not had evening doses   When it happens she will get a feeling of pressure over her right side and a bit of twinging.  This was happening earlier, and was happening slightly when I first started talking to her but has now gone away.  No change in her speech no headache no nausea or vomiting.  No confusion  Physical Exam   Triage Vital Signs: ED Triage Vitals [03/21/23 1805]  Encounter Vitals Group     BP (!) 147/80     Systolic BP Percentile      Diastolic BP Percentile      Pulse Rate 85     Resp 18     Temp 98.4 F (36.9 C)     Temp src      SpO2 99 %     Weight      Height      Head Circumference      Peak Flow      Pain Score      Pain Loc      Pain Education      Exclude from Growth Chart     Most recent vital signs: Vitals:   03/21/23 1805  BP: (!) 147/80  Pulse: 85  Resp: 18  Temp: 98.4 F (36.9 C)  SpO2: 99%     General: Awake, no distress.  CV:  Good peripheral perfusion.  Resp:  Normal effort.  Abd:  No distention.  Other:  Patient demonstrates a slight twitching of abdominal musculature and slight twitching of her right arm and right leg when I first evaluated her, but it has since abated.  Extraocular movements are normal.  She demonstrates 5 out of 5 strength in all extremities.  She is awake  alert fully oriented.  No obvious deficits.  She reports normal sensation.  She has slight chronic weakness in her right arm and right leg, though this is not easily discernible on examination today.   ED Results / Procedures / Treatments   Labs (all labs ordered are listed, but only abnormal results are displayed) Labs Reviewed  COMPREHENSIVE METABOLIC PANEL - Abnormal; Notable for the following components:      Result Value   Potassium 3.3 (*)    Glucose, Bld 105 (*)    Calcium 8.7 (*)    All other components within normal limits  PROTIME-INR  APTT  CBC  DIFFERENTIAL  ETHANOL  I-STAT CREATININE, ED  CBG MONITORING, ED  POC URINE PREG, ED     EKG  Interpreted by me at 1900 heart rate 60 QRS 130 QTc 460 Normal sinus rhythm no evidence of acute ischemia   RADIOLOGY  CT head interpreted by me as grossly negative for acute intracranial hemorrhage  CT VENOGRAM HEAD  Result Date: 03/21/2023 CLINICAL DATA:  Neuro deficit, acute, stroke suspected. Right leg and arm numbness, headache, and slurred speech. 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 EXAM: CT VENOGRAM HEAD TECHNIQUE: Venographic phase images of the brain were obtained following the administration of intravenous contrast. Multiplanar reformats and maximum intensity projections were generated. RADIATION DOSE REDUCTION: This exam was performed according to the departmental dose-optimization program which includes automated exposure control, adjustment of the mA and/or kV according to patient size and/or use of iterative reconstruction technique. CONTRAST:  75mL OMNIPAQUE IOHEXOL 350 MG/ML SOLN COMPARISON:  CTA head and neck 03/02/2022.  Head MRV 08/24/2020. FINDINGS: The superior sagittal sinus, internal cerebral veins, vein of Galen, straight sinus, transverse sinuses, sigmoid sinuses, and jugular bulbs are patent without evidence of thrombus or  significant stenosis. IMPRESSION: No evidence of dural venous sinus thrombosis. Electronically Signed   By: Sebastian Ache M.D.   On: 03/21/2023 19:05   CT ANGIO HEAD NECK W WO CM (CODE STROKE)  Result Date: 03/21/2023 CLINICAL DATA:  Neuro deficit, acute, stroke suspected. Right leg and arm numbness, confusion, headache, and slurred speech. EXAM: CT ANGIOGRAPHY HEAD AND NECK WITH AND WITHOUT CONTRAST TECHNIQUE: Multidetector CT imaging of the head and neck was performed using the standard protocol during bolus administration of intravenous contrast. Multiplanar CT image reconstructions and MIPs were obtained to evaluate the vascular anatomy. Carotid stenosis measurements (when applicable) are obtained utilizing NASCET criteria, using the distal internal carotid diameter as the denominator. RADIATION DOSE REDUCTION: This exam was performed according to the departmental dose-optimization program which includes automated exposure control, adjustment of the mA and/or kV according to patient size and/or use of iterative reconstruction technique. CONTRAST:  75mL OMNIPAQUE IOHEXOL 350 MG/ML SOLN COMPARISON:  CTA head and neck 03/02/2022 FINDINGS: CTA NECK FINDINGS Aortic arch: Normal variant aortic arch branching pattern with common origin of the brachiocephalic and left common carotid arteries. Widely patent arch vessel origins. Right carotid system: Patent without evidence of stenosis or dissection. Left carotid system: Patent without evidence of stenosis or dissection. Vertebral arteries: Patent and codominant without evidence of stenosis or dissection. Skeleton: No acute osseous abnormality or suspicious osseous lesion. Other neck: No evidence of cervical lymphadenopathy or mass. Upper chest: Clear lung apices. Review of the MIP images confirms the above findings CTA HEAD FINDINGS Anterior circulation: The internal carotid arteries are widely patent from skull base to carotid termini. ACAs and MCAs are patent  without evidence of a proximal branch occlusion or significant proximal stenosis. No aneurysm is identified. Posterior circulation: The intracranial vertebral arteries are widely patent to the basilar. Patent PICA, AICA, and SCA origins are visualized bilaterally. The basilar artery is widely patent. Posterior communicating arteries are diminutive or absent. Both PCAs are patent without evidence of a significant proximal stenosis. No aneurysm is identified. Venous sinuses: Patent. Anatomic variants: None. Review of the MIP images confirms the above findings These results were communicated to Dr. Amada Jupiter at 6:53 pm on 03/21/2023 by text page via the [facility] messaging system. IMPRESSION: Negative head and neck CTA. Electronically Signed   By: Sebastian Ache M.D.   On: 03/21/2023 19:02   CT HEAD CODE STROKE WO CONTRAST  Result Date: 03/21/2023 CLINICAL DATA:  Code stroke. Right arm and leg numbness. Headache 2 days ago. EXAM: CT HEAD WITHOUT CONTRAST TECHNIQUE: Contiguous axial images were obtained from the base of the skull through the vertex without intravenous contrast. RADIATION DOSE REDUCTION: This exam was performed  according to the departmental dose-optimization program which includes automated exposure control, adjustment of the mA and/or kV according to patient size and/or use of iterative reconstruction technique. COMPARISON:  Brain MRI 09/10/2022 FINDINGS: Brain: There is no acute intracranial hemorrhage, extra-axial fluid collection, or acute infarct Background parenchymal volume is normal. Encephalomalacia in the left parietal and occipital lobes is unchanged, with stable slight ex vacuo dilatation of the left lateral ventricle. The ventricles are otherwise normal in size. Gray-white differentiation is otherwise preserved An empty sella is noted, nonspecific. The suprasellar region is normal. There is no solid mass lesion. There is no mass effect or midline shift. Vascular: No hyperdense vessel or  unexpected calcification. Skull: Normal. Negative for fracture or focal lesion. Sinuses/Orbits: The imaged paranasal sinuses are clear. The globes and orbits are unremarkable. Other: The mastoid air cells and middle ear cavities are clear. ASPECTS (Alberta Stroke Program Early CT Score) - Ganglionic level infarction (caudate, lentiform nuclei, internal capsule, insula, M1-M3 cortex): 7 - Supraganglionic infarction (M4-M6 cortex): 3 Total score (0-10 with 10 being normal): 10 IMPRESSION: Stable noncontrast head CT with no acute intracranial pathology. These results were called by telephone at the time of interpretation on 03/21/2023 at 6:22 pm to provider Dr Arnoldo Morale, who verbally acknowledged these results. Electronically Signed   By: Lesia Hausen M.D.   On: 03/21/2023 18:24      PROCEDURES:  Critical Care performed: Yes, see critical care procedure note(s)  CRITICAL CARE Performed by: Sharyn Creamer   Total critical care time: 30 minutes  Critical care time was exclusive of separately billable procedures and treating other patients.  Critical care was necessary to treat or prevent imminent or life-threatening deterioration.  Critical care was time spent personally by me on the following activities: development of treatment plan with patient and/or surrogate as well as nursing, discussions with consultants, evaluation of patient's response to treatment, examination of patient, obtaining history from patient or surrogate, ordering and performing treatments and interventions, ordering and review of laboratory studies, ordering and review of radiographic studies, pulse oximetry and re-evaluation of patient's condition.  Procedures   MEDICATIONS ORDERED IN ED: Medications  topiramate (TOPAMAX) tablet 25 mg (25 mg Oral Not Given 03/21/23 2142)  sodium chloride flush (NS) 0.9 % injection 3 mL (3 mLs Intravenous Given 03/21/23 1921)  iohexol (OMNIPAQUE) 350 MG/ML injection 75 mL (75 mLs Intravenous  Contrast Given 03/21/23 1845)  LORazepam (ATIVAN) injection 0.5 mg (0.5 mg Intravenous Given 03/21/23 1900)  potassium chloride SA (KLOR-CON M) CR tablet 40 mEq (40 mEq Oral Given 03/21/23 2145)  aspirin chewable tablet 324 mg (324 mg Oral Given 03/21/23 2146)     IMPRESSION / MDM / ASSESSMENT AND PLAN / ED COURSE  I reviewed the triage vital signs and the nursing notes.                              Differential diagnosis includes, but is not limited to, possible localized seizure, ischemic stroke, lecture light abnormality etc.  Based on the patient's clinical history and her description of her previous symptoms of mild seizure-like aura and symptomatology this seems to match what she is describing today with a right sided shaking feeling.  This is since abated.  Patient was activated as acute code stroke but after further evaluation clinical history taking and evaluation it appears much more likely this is localized seizure-like activity or similar.  She is currently awake alert with resolution  of symptoms  Laboratory evaluation normal CBC, very mild hypokalemia.  Doubt hypokalemia of acute clinical significance at this time.  Will replete with oral  Patient's presentation is most consistent with acute presentation with potential threat to life or bodily function.   The patient is on the cardiac monitor to evaluate for evidence of arrhythmia and/or significant heart rate changes.   Clinical Course as of 03/21/23 2204  Tue Mar 21, 2023  1824 Nursing informing patient still in CT scan with teleneurology [MQ]  1850 Dr. Amada Jupiter recommends ativan 0.5 mg IV, if symptoms resolve ok for discharge. If ongoing symptoms, consider admit. Suspects related to history of seizure disorder. If returns to baseline with ativan, he would recommend discharge and increase topamax to 50 mg BID [MQ]    Clinical Course User Index [MQ] Sharyn Creamer, MD   ----------------------------------------- 6:52 PM on  03/21/2023 ----------------------------------------- Per neurology, Dr. Amada Jupiter advises the patient is not a thrombolytic candidate  ----------------------------------------- 10:03 PM on 03/21/2023 ----------------------------------------- Patient has ongoing persistence of a mild right arm and right leg paresthesia.  She is no longer experiencing any tremulousness or feeling of weakness.  Given the ongoing symptomatology and neurology consultation recommendations she will be admitted for further care treatment including anticipated MRI and EEG.  I have consulted with Dr. Para March, who has accepted patient to hospitalist service  Spanish interpreter utilized for reexamination, NIH score 1 at present time due to right-sided paresthesia.  No evidence of LVO.  Will give aspirin, no evidence of hemorrhage.  FINAL CLINICAL IMPRESSION(S) / ED DIAGNOSES   Final diagnoses:  Paresthesia     Rx / DC Orders   ED Discharge Orders     None        Note:  This document was prepared using Dragon voice recognition software and may include unintentional dictation errors.   Sharyn Creamer, MD 03/21/23 2204

## 2023-03-21 NOTE — Progress Notes (Signed)
Responded to code stroke. Pt was with staff an actively being cared for no present need at this time. Let staff know chaplain services available if family shows up or pt request anything.

## 2023-03-21 NOTE — H&P (Signed)
History and Physical    Patient: Erica Rosario QVZ:[id number] DOB: 25-Dec-1975 DOA: 03/21/2023 DOS: the patient was seen and examined on 03/21/2023 PCP: Hillery Aldo, MD  Patient coming from: Home  Chief Complaint:  Chief Complaint  Patient presents with   Numbness    HPI: Erica Rosario is a 47 y.o. 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  Symptom started at 5 PM and she arrived within the tPA window.  Code stroke was activated.  Initial CT was negative and CTA was negative for LVO.  She was seen by neurologist Dr. 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  He recommended IV Ativan and increasing Topamax from 25-50, and if still symptomatic repeat MRI and EEG.  Patient remains symptomatic after an increased dose of Topamax so hospitalization requested.. Additional ED data review: Vitals were within normal limits Labs which included CBC, CMP and EtOH was significant only for potassium of 3.3. EKG, personally viewed and interpreted showed sinus rhythm at 61 with no acute ST-T wave changes As mentioned above, both CT head and CT angio head and neck showed no acute finding CT venogram also done which showed no evidence of dural venous sinus thrombosis   Review of Systems: As mentioned in the history of present illness. All other systems reviewed and are negative.  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 She has never used smokeless tobacco. She reports that she does not currently use alcohol. She reports that she does not use drugs.  No Known Allergies  Family History  Problem Relation Age of Onset   Breast cancer Neg Hx     Prior to Admission medications   Medication Sig Start Date End Date Taking? Authorizing Provider  acetaminophen (TYLENOL) 500 MG tablet Take 500 mg by mouth as needed.    [provider]  aspirin EC 81 MG tablet Take 1 tablet (81 mg total) by mouth daily. Swallow whole. 02/03/22   Ihor Austin, NP  atorvastatin (LIPITOR) 40 MG tablet Take 40 mg by mouth daily.    [provider]  hydrocortisone 2.5 % cream Apply 1 Application topically 2 (two) times daily. 03/01/23   [provider]  levETIRAcetam (KEPPRA) 500 MG tablet Take 2 tablets (1,000 mg total) by mouth every morning AND 3 tablets (1,500 mg total) at bedtime. 03/08/23   Ihor Austin, NP  topiramate (TOPAMAX) 25 MG tablet Take 1 tablet (25 mg total) by mouth 2 (two) times daily. 03/08/23   Ihor Austin, NP    Physical Exam: Vitals:   03/21/23 1805 03/21/23 1853  BP: (!) 147/80   Pulse: 85   Resp: 18   Temp: 98.4 F (36.9 C)   SpO2: 99%   Weight:  61.1 kg  Height:  5' 4.75" (1.645 m)   Physical Exam Vitals and nursing note reviewed.   Constitutional:      General: She is not in acute distress. HENT:     Head: Normocephalic and atraumatic.  Cardiovascular:     Rate and Rhythm: Normal rate and regular rhythm.     Heart sounds: Normal heart sounds.  Pulmonary:     Effort: Pulmonary effort is normal.     Breath sounds: Normal breath sounds.  Abdominal:     Palpations: Abdomen is soft.     Tenderness: There is no abdominal tenderness.  Neurological:     Mental Status: Mental status is at baseline.     Labs on Admission: I have personally reviewed following labs and imaging studies  CBC: Recent Labs  Lab 03/21/23 1844  WBC 5.7  NEUTROABS 3.2  HGB 13.5  HCT 40.2  MCV 97.3  PLT 202   Basic Metabolic Panel: Recent Labs  Lab 03/21/23 1844  NA 137  K 3.3*  CL 106  CO2 22  GLUCOSE 105*  BUN 15  CREATININE 0.80  CALCIUM 8.7*   GFR: Estimated Creatinine Clearance: 76.6 mL/min (by C-G formula based on SCr of 0.8 mg/dL). Liver Function Tests: Recent Labs  Lab 03/21/23 1844  AST 26  ALT 27  ALKPHOS 77  BILITOT 0.5  PROT 7.7  ALBUMIN 4.1   No results for input(s): "LIPASE", "AMYLASE" in the last 168 hours. No results for input(s): "AMMONIA" in the last 168 hours. Coagulation Profile: Recent Labs  Lab 03/21/23 1844  INR 1.0   Cardiac Enzymes: No results for input(s): "CKTOTAL", "CKMB", "CKMBINDEX", "TROPONINI" in the last 168 hours. BNP (last 3 results) No results for input(s): "PROBNP" in the last 8760 hours. HbA1C: No results for input(s): "HGBA1C" in the last 72 hours. CBG: No results for input(s): "GLUCAP" in the last 168 hours. Lipid Profile: No results for input(s): "CHOL", "HDL", "LDLCALC", "TRIG", "CHOLHDL", "LDLDIRECT" in the last 72 hours. Thyroid Function Tests: No results for input(s): "TSH", "T4TOTAL", "FREET4", "T3FREE", "THYROIDAB" in the last 72 hours. Anemia Panel: No results for input(s): "VITAMINB12", "FOLATE", "FERRITIN", "TIBC", "IRON", "RETICCTPCT" in the last 72  hours. Urine analysis:    Component Value Date/Time   COLORURINE STRAW (A) 03/02/2022 1641   APPEARANCEUR CLEAR (A) 03/02/2022 1641   LABSPEC 1.023 03/02/2022 1641   PHURINE 7.0 03/02/2022 1641   GLUCOSEU NEGATIVE 03/02/2022 1641   HGBUR NEGATIVE 03/02/2022 1641   BILIRUBINUR NEGATIVE 03/02/2022 1641   KETONESUR NEGATIVE 03/02/2022 1641   PROTEINUR NEGATIVE 03/02/2022 1641   UROBILINOGEN 0.2 07/02/2010 0959   NITRITE NEGATIVE 03/02/2022 1641   LEUKOCYTESUR NEGATIVE 03/02/2022 1641    Radiological Exams on Admission: CT VENOGRAM HEAD  Result Date: 03/21/2023 CLINICAL DATA:  Neuro deficit, acute, stroke suspected. Right leg and arm numbness, headache, and slurred speech. 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 EXAM: CT VENOGRAM HEAD TECHNIQUE: Venographic phase images of the brain were obtained following the administration of intravenous contrast. Multiplanar reformats and maximum intensity projections were generated. RADIATION  DOSE REDUCTION: This exam was performed according to the departmental dose-optimization program which includes automated exposure control, adjustment of the mA and/or kV according to patient size and/or use of iterative reconstruction technique. CONTRAST:  75mL OMNIPAQUE IOHEXOL 350 MG/ML SOLN COMPARISON:  CTA head and neck 03/02/2022.  Head MRV 08/24/2020. FINDINGS: The superior sagittal sinus, internal cerebral veins, vein of Galen, straight sinus, transverse sinuses, sigmoid sinuses, and jugular bulbs are patent without evidence of thrombus or significant stenosis. IMPRESSION: No evidence of dural venous sinus thrombosis. Electronically Signed   By: Sebastian Ache M.D.   On: 03/21/2023 19:05   CT ANGIO HEAD NECK W WO CM (CODE STROKE)  Result Date: 03/21/2023 CLINICAL DATA:  Neuro deficit, acute, stroke suspected. Right leg and arm numbness, confusion, headache, and slurred speech. EXAM: CT  ANGIOGRAPHY HEAD AND NECK WITH AND WITHOUT CONTRAST TECHNIQUE: Multidetector CT imaging of the head and neck was performed using the standard protocol during bolus administration of intravenous contrast. Multiplanar CT image reconstructions and MIPs were obtained to evaluate the vascular anatomy. Carotid stenosis measurements (when applicable) are obtained utilizing NASCET criteria, using the distal internal carotid diameter as the denominator. RADIATION DOSE REDUCTION: This exam was performed according to the departmental dose-optimization program which includes automated exposure control, adjustment of the mA and/or kV according to patient size and/or use of iterative reconstruction technique. CONTRAST:  75mL OMNIPAQUE IOHEXOL 350 MG/ML SOLN COMPARISON:  CTA head and neck 03/02/2022 FINDINGS: CTA NECK FINDINGS Aortic arch: Normal variant aortic arch branching pattern with common origin of the brachiocephalic and left common carotid arteries. Widely patent arch vessel origins. Right carotid system: Patent without evidence of stenosis or dissection. Left carotid system: Patent without evidence of stenosis or dissection. Vertebral arteries: Patent and codominant without evidence of stenosis or dissection. Skeleton: No acute osseous abnormality or suspicious osseous lesion. Other neck: No evidence of cervical lymphadenopathy or mass. Upper chest: Clear lung apices. Review of the MIP images confirms the above findings CTA HEAD FINDINGS Anterior circulation: The internal carotid arteries are widely patent from skull base to carotid termini. ACAs and MCAs are patent without evidence of a proximal branch occlusion or significant proximal stenosis. No aneurysm is identified. Posterior circulation: The intracranial vertebral arteries are widely patent to the basilar. Patent PICA, AICA, and SCA origins are visualized bilaterally. The basilar artery is widely patent. Posterior communicating arteries are diminutive or absent.  Both PCAs are patent without evidence of a significant proximal stenosis. No aneurysm is identified. Venous sinuses: Patent. Anatomic variants: None. Review of the MIP images confirms the above findings These results were communicated to Dr. Amada Jupiter at 6:53 pm on 03/21/2023 by text page via the [facility] messaging system. IMPRESSION: Negative head and neck CTA. Electronically Signed   By: Sebastian Ache M.D.   On: 03/21/2023 19:02   CT HEAD CODE STROKE WO CONTRAST  Result Date: 03/21/2023 CLINICAL DATA:  Code stroke. Right arm and leg numbness. Headache 2 days ago. EXAM: CT HEAD WITHOUT CONTRAST TECHNIQUE: Contiguous axial images were obtained from the base of the skull through the vertex without intravenous contrast. RADIATION DOSE REDUCTION: This exam was performed according to the departmental dose-optimization program which includes automated exposure control, adjustment of the mA and/or kV according to patient size and/or use of iterative reconstruction technique. COMPARISON:  Brain MRI 09/10/2022 FINDINGS: Brain: There is no acute intracranial hemorrhage, extra-axial fluid collection, or acute infarct Background parenchymal volume is normal. Encephalomalacia in the left parietal and occipital lobes is unchanged,  with stable slight ex vacuo dilatation of the left lateral ventricle. The ventricles are otherwise normal in size. Gray-white differentiation is otherwise preserved An empty sella is noted, nonspecific. The suprasellar region is normal. There is no solid mass lesion. There is no mass effect or midline shift. Vascular: No hyperdense vessel or unexpected calcification. Skull: Normal. Negative for fracture or focal lesion. Sinuses/Orbits: The imaged paranasal sinuses are clear. The globes and orbits are unremarkable. Other: The mastoid air cells and middle ear cavities are clear. ASPECTS ([place] Stroke Program Early CT Score) - Ganglionic level infarction (caudate, lentiform nuclei, internal capsule,  insula, M1-M3 cortex): 7 - Supraganglionic infarction (M4-M6 cortex): 3 Total score (0-10 with 10 being normal): 10 IMPRESSION: Stable noncontrast head CT with no acute intracranial pathology. These results were called by telephone at the time of interpretation on 03/21/2023 at 6:22 pm to provider Dr Arnoldo Morale, who verbally acknowledged these results. Electronically Signed   By: Lesia Hausen M.D.   On: 03/21/2023 18:24     Data Reviewed: Relevant notes from primary care and specialist visits, past discharge summaries as available in EHR, including Care Everywhere. 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  CT and CTA negative Will still get stroke workup with MRI, continuous cardiac monitoring and care echo Permissive hypertension for first 24-48 hrs post stroke onset: Prn Labetalol IV or Vasotec IV If BP greater than 220/120  Statins for LDL goal less than 70 ASA 81mg  daily, Plavix 75mg  daily x 3 weeks then monotherapy thereafter if evidence of stroke on MRI Avoid dextrose containing fluids, Maintain euglycemia, euthermia Neuro checks q4 hrs x 24 hrs and then per shift Head of bed 30 degrees Physical therapy/Occupational therapy/Speech therapy if failed dysphagia screen Neurology consult to follow Addendum:MRI-->No acute intracranial abnormality. 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 Observation status is judged to be reasonable and necessary in order to provide the required intensity of service to ensure the patient's safety. The patient's presenting symptoms, physical exam findings, and initial radiographic and laboratory data in the context of their medical condition is felt to place them at decreased risk for further clinical deterioration. Furthermore, it is anticipated that the patient will be medically stable for discharge from the hospital within 2 midnights of admission.   CRITICAL CARE Performed by: Andris Baumann   Total critical care time: 60 minutes  Critical care time was exclusive of separately billable procedures and treating other patients.  Critical care was necessary to treat or prevent imminent or life-threatening deterioration.  Critical care was time spent personally by me on the following activities: development of  treatment plan with patient and/or surrogate as well as nursing, discussions with consultants, evaluation of patient's response to treatment, examination of patient, obtaining history from patient or surrogate, ordering and performing treatments and interventions, ordering and review of laboratory studies, ordering and review of radiographic studies, pulse oximetry and re-evaluation of patient's condition.  Author: Andris Baumann, MD 03/21/2023 10:12 PM  For on call review [email or web address].

## 2023-03-21 NOTE — Consult Note (Signed)
[facility] Telemedicine Consult   Requesting Provider: Sherryl Manges Consult Participants: Interpreter, bedside nurse, telestroke nurse Location of the provider: [address] [place], [place] Location of the patient: [facility]  This consult was provided via telemedicine with 2-way video and audio communication. The patient/family was informed that care would be provided in this way and agreed to receive care in this manner.    Chief Complaint: Right arm pain  HPI: 47 yo F with a history of hemorrhagic venous infarct secondary to CVST who presents with right arm pain, confusion, and a feeling "like I am about to have a seizure." She has not had any events like this for about a year. I saw her in person in September of 2023 for similar symptoms and at that time she had recently missed a dose of her seizure medicine. At that time, I also noted some concern for exam embellishment.     LKW: 5pm tpa given?: No, previous [facility] IR Thrombectomy? No, no LVO Modified Rankin Scale: 1-No significant post stroke disability and can perform usual duties with stroke symptoms Time of teleneurologist evaluation: 18:19  Exam: Vitals:   03/21/23 1805  BP: (!) 147/80  Pulse: 85  Resp: 18  Temp: 98.4 F (36.9 C)  SpO2: 99%    General: in bed, NAD  1A: Level of Consciousness - 0 1B: Ask Month and Age - 1(September when it is oct 1) 1C: 'Blink Eyes' & 'Squeeze Hands' - 0 2: Test Horizontal Extraocular Movements - 0 3: Test Visual Fields - 1 4: Test Facial Palsy - 0 5A: Test Left Arm Motor Drift - 0 5B: Test Right Arm Motor Drift - 0 6A: Test Left Leg Motor Drift - 0 6B: Test Right Leg Motor Drift - 0 7: Test Limb Ataxia - 0 8: Test Sensation - 1 9: Test Language/Aphasia- 0 10: Test Dysarthria - 0 11: Test Extinction/Inattention - 1 NIHSS score: 4   Imaging Reviewed: CT head - negative  Labs reviewed in epic and pertinent values follow: Cr 0.8   Assessment: 47 yo F with a history of CVST with  hemorrhagic stroke and seizure who presents with right sided numbness and weakness. My suspicion is this represents partial seizure with todd's phenomenon, and if she returns to baseline, could just increase topamax as she is on a very low dose. If she is still symptomatic, may need to repeat MRI and EEG  Recommendations: 1) MRI brain and EEG if not back to baseline.  2) continue keppra 1g qam and 1.5 g qpm 3) increase topiramate to 50mg  BID 4) neurology will follow if admitted   Ritta Slot, MD [facility] [phone]  If 7pm- 7am, please page neurology on call as listed in AMION.

## 2023-03-21 NOTE — Assessment & Plan Note (Signed)
CT venogram with no evidence of sinus thrombosis

## 2023-03-21 NOTE — ED Notes (Signed)
Back in ER room at this time

## 2023-03-21 NOTE — ED Triage Notes (Signed)
Pt to triage via wheelchair.  Pt reports numbness in right leg, right arm for 1 hour.  Pt reports confusion.  Pt had a headache 2 days ago and has heaviness in head now.  Pt reports slurred speech earlier.  None now.   Pt alert.

## 2023-03-21 NOTE — Assessment & Plan Note (Addendum)
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

## 2023-03-21 NOTE — ED Notes (Signed)
Neurologist on cart at this time speaking with patient through interpretor

## 2023-03-21 NOTE — ED Notes (Signed)
MD quale at bedside

## 2023-03-22 ENCOUNTER — Telehealth: Payer: Self-pay | Admitting: Adult Health

## 2023-03-22 ENCOUNTER — Encounter: Payer: Self-pay | Admitting: Internal Medicine

## 2023-03-22 ENCOUNTER — Ambulatory Visit: Payer: 59

## 2023-03-22 DIAGNOSIS — R29818 Other symptoms and signs involving the nervous system: Secondary | ICD-10-CM | POA: Diagnosis not present

## 2023-03-22 DIAGNOSIS — R202 Paresthesia of skin: Secondary | ICD-10-CM | POA: Diagnosis not present

## 2023-03-22 LAB — LIPID PANEL
Cholesterol: 137 mg/dL (ref 0–200)
HDL: 52 mg/dL (ref >40)
LDL Cholesterol: 65 mg/dL (ref 0–99)
Total CHOL/HDL Ratio: 2.6 {ratio}
Triglycerides: 99 mg/dL (ref <150)
VLDL: 20 mg/dL (ref 0–40)

## 2023-03-22 LAB — CBC
HCT: 37.6 % (ref 36.0–46.0)
Hemoglobin: 12.9 g/dL (ref 12.0–15.0)
MCH: 32.7 pg (ref 26.0–34.0)
MCHC: 34.3 g/dL (ref 30.0–36.0)
MCV: 95.4 fL (ref 80.0–100.0)
Platelets: 186 10*3/uL (ref 150–400)
RBC: 3.94 MIL/uL (ref 3.87–5.11)
RDW: 12.3 % (ref 11.5–15.5)
WBC: 6.8 10*3/uL (ref 4.0–10.5)
nRBC: 0 % (ref 0.0–0.2)

## 2023-03-22 LAB — HEMOGLOBIN A1C
Hgb A1c MFr Bld: 5.6 % (ref 4.8–5.6)
Mean Plasma Glucose: 114.02 mg/dL

## 2023-03-22 LAB — HIV ANTIBODY (ROUTINE TESTING W REFLEX): HIV Screen 4th Generation wRfx: NONREACTIVE

## 2023-03-22 MED ORDER — TOPIRAMATE 50 MG PO TABS: 50.0000 mg | ORAL_TABLET | Freq: Two times a day (BID) | ORAL | 1 refills | Status: DC

## 2023-03-22 NOTE — Progress Notes (Signed)
Subjective: Still feels a little bit of paresthesia, but almost back to normal  Exam: Vitals:   03/22/23 0601 03/22/23 0857  BP:  94/66  Pulse:  (!) 54  Resp:  16  Temp: 98.6 F (37 C)   SpO2:  100%   Gen: In bed, NAD Resp: non-labored breathing, no acute distress Abd: soft, nt  Neuro: MS: Awake, alert, interactive and appropriate, oriented CN: Visual fields with right lower quadrantanopia, face symmetric Motor: No drift in either upper extremity, good strength in bilateral lower extremities Sensory: Intact to light touch   Pertinent Labs: Creatinine 0.8  Impression: 47 year old female with a history of previous hemorrhagic infarct who presented with right-sided numbness and weakness.  She had a similar presentation in September 2023, which is presumed secondary to seizure.  With her duration of symptoms and negative MRI, very low suspicion that this is cerebrovascular in nature, but it is possible that she had a recurrent seizure with a prolonged Todd's phenomenon.  With her improvement at the current time, I do not feel like she needs any further evaluation, but I would favor increasing her Topamax.  Recommendations: 1) continue Keppra 1 g every morning and 1500 every afternoon 2) increase Topamax to 50 mg twice daily 3) follow-up with outpatient neurology  Ritta Slot, MD [facility] [phone]  If 7pm- 7am, please page neurology on call as listed in AMION.

## 2023-03-22 NOTE — Telephone Encounter (Signed)
Pt's son Jason(not on [place], but translated for pt) he called to report that pt was in ED on yesterday for light seizures, he reported that pt's  topiramate (TOPAMAX) was increased,  He is asking for a call to discuss what Shanda Bumps, NP would suggest. His # is [phone]

## 2023-03-22 NOTE — Discharge Summary (Signed)
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  Symptom started at 5 PM and she arrived within the tPA window.  Code stroke was activated.  Initial CT was negative and CTA was negative for LVO.  She was seen by neurologist Dr. 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  He recommended IV Ativan and increasing Topamax from 25-50, and if still symptomatic repeat MRI and EEG.  Patient remains symptomatic after an increased dose of Topamax so hospitalization requested.. Additional ED data review: Vitals were within normal limits Labs which included CBC, CMP and EtOH was significant only for potassium of 3.3. EKG, personally viewed and interpreted showed sinus rhythm at 61 with no acute ST-T wave changes As  mentioned above, both CT head and CT angio head and neck showed no acute finding CT venogram also done which showed no evidence of dural venous sinus thrombosis"\   Further hospital course and management as outlined below.  10/2 -- pt doing well,r eports her presenting symnptoms have resolved.   Medically stable for and requesting discharge home today.  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  CT and CTA negative Will still get stroke workup with MRI, continuous cardiac monitoring and care echo Permissive hypertension for first 24-48 hrs post stroke onset: Prn Labetalol IV or Vasotec IV If BP greater than 220/120  Statins for LDL goal less than 70 ASA 81mg  daily, Plavix 75mg  daily x 3 weeks then monotherapy thereafter if evidence of stroke on MRI Avoid dextrose containing fluids, Maintain euglycemia, euthermia Neuro checks q4 hrs x 24 hrs and then per shift Head of bed 30 degrees Physical therapy/Occupational therapy/Speech therapy if failed dysphagia screen Neurology consult to follow Addendum:MRI-->No acute intracranial abnormality. 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  CT venogram with no evidence of sinus  thrombosis         Consultants: Neurology Procedures performed: as above  Disposition: Home Diet recommendation:  Discharge Diet Orders (From admission, onward)     Start     Ordered   03/22/23 0000  Diet - low sodium heart healthy        03/22/23 1329            DISCHARGE MEDICATION: Allergies as of 03/22/2023   No Known Allergies      Medication List     STOP taking these medications    topiramate 25 MG tablet Commonly known as: Topamax       TAKE these medications    acetaminophen 500 MG tablet Commonly known as: TYLENOL Take 500 mg by mouth as needed.   aspirin EC 81 MG tablet Take 1 tablet (81 mg total) by mouth daily. Swallow whole.   atorvastatin 40 MG tablet Commonly known as: LIPITOR Take 40 mg by mouth daily.   hydrocortisone 2.5 % cream Apply 1 Application topically 2 (two) times daily.   levETIRAcetam 500 MG tablet Commonly known as: Keppra Take 2 tablets (1,000 mg total) by mouth every morning AND 3 tablets (1,500 mg total) at bedtime.        Discharge Exam: Filed Weights   03/21/23 1853  Weight: 61.1 kg   General exam: awake, alert, no acute distress HEENT: moist mucus membranes, hearing grossly normal  Respiratory system: CTAB, no wheezes, rales or rhonchi, normal respiratory effort. Cardiovascular system: normal S1/S2, RRR, no JVD, murmurs, rubs, gallops, no pedal edema.   Gastrointestinal system: soft, NT, ND, no HSM felt, +bowel sounds. Central nervous system: A&O x 3. no gross focal neurologic deficits, normal speech Extremities: moves all, no edema, normal tone Skin: dry, intact, normal temperature, normal color, No rashes, lesions or ulcers Psychiatry: normal mood, congruent affect, judgement and insight appear normal   Condition at discharge: stable  The results of significant diagnostics from this hospitalization (including imaging, microbiology, ancillary and laboratory) are listed below for reference.    Imaging Studies: MR BRAIN WO CONTRAST  Result Date: 03/22/2023 CLINICAL DATA:  Right arm pain and confusion EXAM: MRI HEAD WITHOUT CONTRAST TECHNIQUE: Multiplanar, multiecho pulse sequences of the brain and surrounding structures were obtained without intravenous contrast. COMPARISON:  09/10/2022 FINDINGS: Brain: No acute infarct, mass effect or extra-axial collection. 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 No acute hemorrhage. Aside from gliosis in the posterior left hemisphere, the parenchymal signal of the brain is normal. Normal CSF spaces. The midline structures are normal. The hippocampi are normal and symmetric in size and signal. The hypothalamus and mamillary bodies are normal. There is no cortical ectopia or dysplasia. Vascular: Normal flow voids. Skull and upper cervical spine: Normal marrow signal. Sinuses/Orbits: Negative. Other: None. IMPRESSION: 1. No acute intracranial abnormality. 2. 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 Electronically Signed   By: Deatra Robinson M.D.   On: 03/22/2023 01:01   CT VENOGRAM HEAD  Result Date: 03/21/2023 CLINICAL DATA:  Neuro deficit, acute, stroke suspected. Right leg and arm numbness, headache, and slurred speech. 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 EXAM: CT VENOGRAM HEAD TECHNIQUE: Venographic phase images of the brain were obtained following the administration of intravenous contrast. Multiplanar reformats and maximum intensity projections were generated. RADIATION DOSE REDUCTION: This exam was performed according to the departmental dose-optimization program which includes automated  exposure control, adjustment of the mA and/or kV according to patient size and/or use of iterative reconstruction technique. CONTRAST:  75mL OMNIPAQUE IOHEXOL 350 MG/ML SOLN COMPARISON:  CTA head and neck 03/02/2022.  Head MRV 08/24/2020. FINDINGS: The superior sagittal  sinus, internal cerebral veins, vein of Galen, straight sinus, transverse sinuses, sigmoid sinuses, and jugular bulbs are patent without evidence of thrombus or significant stenosis. IMPRESSION: No evidence of dural venous sinus thrombosis. Electronically Signed   By: Sebastian Ache M.D.   On: 03/21/2023 19:05   CT ANGIO HEAD NECK W WO CM (CODE STROKE)  Result Date: 03/21/2023 CLINICAL DATA:  Neuro deficit, acute, stroke suspected. Right leg and arm numbness, confusion, headache, and slurred speech. EXAM: CT ANGIOGRAPHY HEAD AND NECK WITH AND WITHOUT CONTRAST TECHNIQUE: Multidetector CT imaging of the head and neck was performed using the standard protocol during bolus administration of intravenous contrast. Multiplanar CT image reconstructions and MIPs were obtained to evaluate the vascular anatomy. Carotid stenosis measurements (when applicable) are obtained utilizing NASCET criteria, using the distal internal carotid diameter as the denominator. RADIATION DOSE REDUCTION: This exam was performed according to the departmental dose-optimization program which includes automated exposure control, adjustment of the mA and/or kV according to patient size and/or use of iterative reconstruction technique. CONTRAST:  75mL OMNIPAQUE IOHEXOL 350 MG/ML SOLN COMPARISON:  CTA head and neck 03/02/2022 FINDINGS: CTA NECK FINDINGS Aortic arch: Normal variant aortic arch branching pattern with common origin of the brachiocephalic and left common carotid arteries. Widely patent arch vessel origins. Right carotid system: Patent without evidence of stenosis or dissection. Left carotid system: Patent without evidence of stenosis or dissection. Vertebral arteries: Patent and codominant without evidence of stenosis or dissection. Skeleton: No acute osseous abnormality or suspicious osseous lesion. Other neck: No evidence of cervical lymphadenopathy or mass. Upper chest: Clear lung apices. Review of the MIP images confirms the above  findings CTA HEAD FINDINGS Anterior circulation: The internal carotid arteries are widely patent from skull base to carotid termini. ACAs and MCAs are patent without evidence of a proximal branch occlusion or significant proximal stenosis. No aneurysm is identified. Posterior circulation: The intracranial vertebral arteries are widely patent to the basilar. Patent PICA, AICA, and SCA origins are visualized bilaterally. The basilar artery is widely patent. Posterior communicating arteries are diminutive or absent. Both PCAs are patent without evidence of a significant proximal stenosis. No aneurysm is identified. Venous sinuses: Patent. Anatomic variants: None. Review of the MIP images confirms the above findings These results were communicated to Dr. Amada Jupiter at 6:53 pm on 03/21/2023 by text page via the [facility] messaging system. IMPRESSION: Negative head and neck CTA. Electronically Signed   By: Sebastian Ache M.D.   On: 03/21/2023 19:02   CT HEAD CODE STROKE WO CONTRAST  Result Date: 03/21/2023 CLINICAL DATA:  Code stroke. Right arm and leg numbness. Headache 2 days ago. EXAM: CT HEAD WITHOUT CONTRAST TECHNIQUE: Contiguous axial images were obtained from the base of the skull through the vertex without intravenous contrast. RADIATION DOSE REDUCTION: This exam was performed according to the departmental dose-optimization program which includes automated exposure control, adjustment of the mA and/or kV according to patient size and/or use of iterative reconstruction technique. COMPARISON:  Brain MRI 09/10/2022 FINDINGS: Brain: There is no acute intracranial hemorrhage, extra-axial fluid collection, or acute infarct Background parenchymal volume is normal. Encephalomalacia in the left parietal and occipital lobes is unchanged, with stable slight ex vacuo dilatation of the left lateral ventricle. The ventricles are otherwise  normal in size. Gray-white differentiation is otherwise preserved An empty sella is noted,  nonspecific. The suprasellar region is normal. There is no solid mass lesion. There is no mass effect or midline shift. Vascular: No hyperdense vessel or unexpected calcification. Skull: Normal. Negative for fracture or focal lesion. Sinuses/Orbits: The imaged paranasal sinuses are clear. The globes and orbits are unremarkable. Other: The mastoid air cells and middle ear cavities are clear. ASPECTS (Alberta Stroke Program Early CT Score) - Ganglionic level infarction (caudate, lentiform nuclei, internal capsule, insula, M1-M3 cortex): 7 - Supraganglionic infarction (M4-M6 cortex): 3 Total score (0-10 with 10 being normal): 10 IMPRESSION: Stable noncontrast head CT with no acute intracranial pathology. These results were called by telephone at the time of interpretation on 03/21/2023 at 6:22 pm to provider Dr Arnoldo Morale, who verbally acknowledged these results. Electronically Signed   By: Lesia Hausen M.D.   On: 03/21/2023 18:24    Microbiology: Results for orders placed or performed during the hospital encounter of 04/02/21  Resp Panel by RT-PCR (Flu A&B, Covid) Nasopharyngeal Swab     Status: None   Collection Time: 04/02/21 10:30 PM   Specimen: Nasopharyngeal Swab; Nasopharyngeal(NP) swabs in vial transport medium  Result Value Ref Range Status   SARS Coronavirus 2 by RT PCR NEGATIVE NEGATIVE Final    Comment: (NOTE) SARS-CoV-2 target nucleic acids are NOT DETECTED.  The SARS-CoV-2 RNA is generally detectable in upper respiratory specimens during the acute phase of infection. The lowest concentration of SARS-CoV-2 viral copies this assay can detect is 138 copies/mL. A negative result does not preclude SARS-Cov-2 infection and should not be used as the sole basis for treatment or other patient management decisions. A negative result may occur with  improper specimen collection/handling, submission of specimen other than nasopharyngeal swab, presence of viral mutation(s) within the areas targeted by  this assay, and inadequate number of viral copies(<138 copies/mL). A negative result must be combined with clinical observations, patient history, and epidemiological information. The expected result is Negative.  Fact Sheet for Patients:  BloggerCourse.com  Fact Sheet for Healthcare Providers:  SeriousBroker.it  This test is no t yet approved or cleared by the [place] FDA and  has been authorized for detection and/or diagnosis of SARS-CoV-2 by FDA under an Emergency Use Authorization (EUA). This EUA will remain  in effect (meaning this test can be used) for the duration of the COVID-19 declaration under Section 564(b)(1) of the Act, 21 U.S.C.section 360bbb-3(b)(1), unless the authorization is terminated  or revoked sooner.       Influenza A by PCR NEGATIVE NEGATIVE Final   Influenza B by PCR NEGATIVE NEGATIVE Final    Comment: (NOTE) The Xpert Xpress SARS-CoV-2/FLU/RSV plus assay is intended as an aid in the diagnosis of influenza from Nasopharyngeal swab specimens and should not be used as a sole basis for treatment. Nasal washings and aspirates are unacceptable for Xpert Xpress SARS-CoV-2/FLU/RSV testing.  Fact Sheet for Patients: BloggerCourse.com  Fact Sheet for Healthcare Providers: SeriousBroker.it  This test is not yet approved or cleared by the [place] FDA and has been authorized for detection and/or diagnosis of SARS-CoV-2 by FDA under an Emergency Use Authorization (EUA). This EUA will remain in effect (meaning this test can be used) for the duration of the COVID-19 declaration under Section 564(b)(1) of the Act, 21 U.S.C. section 360bbb-3(b)(1), unless the authorization is terminated or revoked.  Performed at [facility], [address], [place], [place] [address]     Labs:  CBC: No results for input(s): "WBC", "NEUTROABS", "HGB",  "HCT", "MCV", "PLT" in the last 168 hours.  Basic Metabolic Panel: No results for input(s): "NA", "K", "CL", "CO2", "GLUCOSE", "BUN", "CREATININE", "CALCIUM", "MG", "PHOS" in the last 168 hours.  Liver Function Tests: No results for input(s): "AST", "ALT", "ALKPHOS", "BILITOT", "PROT", "ALBUMIN" in the last 168 hours.  CBG: No results for input(s): "GLUCAP" in the last 168 hours.  Discharge time spent: greater than 30 minutes.  Signed: Pennie Banter, DO [facility] 04/05/2023

## 2023-03-22 NOTE — Evaluation (Signed)
Physical Therapy Evaluation Patient Details Name: Erica Rosario MRN: [id number] DOB: Oct 28, 1975 Today's Date: 03/22/2023  History of Present Illness  47 y.o. 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   She arrived within the tPA window.  Code stroke was activated.  Initial CT was negative and CTA was negative for LVO.  She was seen by neurologist Dr. 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  Clinical Impression  Pt pleasant and willing to participate, ultimately did quite well.  She continues to report some mild dizziness that was not functionally limiting (BP was 90s/50s pre and post ambulation with SpO2 and HR stable and appropriate).  She showed good overall safety and confidence with >200 ft of ambulation w/o AD and no LOBs or limping.  Will benefit from continued PT in the acute setting to continue to monitor symptoms/function - pt reports feeling relatively close to her baseline with improvement thus far.      If plan is discharge home, recommend the following: Assist for transportation   Can travel by private vehicle    yes    Equipment Recommendations None recommended by PT  Recommendations for Other Services       Functional Status Assessment Patient has had a recent decline in their functional status and demonstrates the ability to make significant improvements in function in a reasonable and predictable amount of time.     Precautions /  Restrictions        Mobility  Bed Mobility Overal bed mobility: Independent                  Transfers Overall transfer level: Modified independent Equipment used: None               General transfer comment: easily gets to standing from EOB w/o assist    Ambulation/Gait Ambulation/Gait assistance: Modified independent (Device/Increase time) Gait Distance (Feet): 250 Feet Assistive device: None         General Gait Details: Pt with mild c/o dizziness that improved with increased distance.  She had no LOBs, limp, discordant steppage or other issues - ambulating a little slower but essentially at baseline.  Stairs            Wheelchair Mobility     Tilt Bed    Modified Rankin (Stroke Patients Only)       Balance Overall balance assessment: Modified Independent                                           Pertinent Vitals/Pain Pain Assessment Pain Assessment: No/denies pain    Home Living Family/patient expects to be discharged to:: Private residence Living Arrangements: Spouse/significant other                      Prior Function Prior Level of Function : Independent/Modified Independent             Mobility Comments: Pt drives  kids to school, remains active, does not need AD ADLs Comments: independent     Extremity/Trunk Assessment   Upper Extremity Assessment Upper Extremity Assessment: Generalized weakness;Overall [facility] for tasks assessed    Lower Extremity Assessment Lower Extremity Assessment: Generalized weakness;Overall [facility] for tasks assessed       Communication   Communication Communication: No apparent difficulties Cueing Techniques:  (Spanish speaking, Interp Ceaser [phone])  Cognition Arousal: Alert Behavior During Therapy: WFL for tasks assessed/performed Overall Cognitive Status: Within Functional Limits for tasks assessed                                           General Comments General comments (skin integrity, edema, etc.): Pt reports symptoms are improving, minimal continued weakness, numbness or dizziness    Exercises     Assessment/Plan    PT Assessment Patient needs continued PT services  PT Problem List Decreased activity tolerance;Cardiopulmonary status limiting activity       PT Treatment Interventions Gait training;Stair training;Therapeutic exercise;Therapeutic activities;Balance training;Neuromuscular re-education;Patient/family education    PT Goals (Current goals can be found in the Care Plan section)  Acute Rehab PT Goals Patient Stated Goal: go home today PT Goal Formulation: With patient Time For Goal Achievement: 04/04/23 Potential to Achieve Goals: Good    Frequency Min 1X/week     Co-evaluation               AM-PAC PT "6 Clicks" Mobility  Outcome Measure Help needed turning from your back to your side while in a flat bed without using bedrails?: None Help needed moving from lying on your back to sitting on the side of a flat bed without using bedrails?: None Help needed moving to and from a bed to a chair (including a wheelchair)?: None Help needed standing up from a chair using your arms (e.g., wheelchair or bedside chair)?: None Help needed to walk in hospital room?: None Help needed climbing 3-5 steps with a railing? : None 6 Click Score: 24    End of Session Equipment Utilized During Treatment: Gait belt Activity Tolerance: Patient tolerated treatment well Patient left: with call bell/phone within reach;with bed alarm set Nurse Communication: Mobility status PT Visit Diagnosis: Muscle weakness (generalized) (M62.81);Dizziness and giddiness (R42)    Time: 0454-0981 PT Time Calculation (min) (ACUTE ONLY): 26 min   Charges:   PT Evaluation $PT Eval Low Complexity: 1 Low   PT General Charges $$ ACUTE PT VISIT: 1 Visit         Malachi Pro, DPT 03/22/2023, 10:45 AM

## 2023-03-23 NOTE — Telephone Encounter (Signed)
Pt's son is not listed on the DPR. Called the spanish interpretor line. A total of 25 minutes was spent on the phone. Pt is currently at home she states that since taking the increase dose of the topiramate she has vomited has light headed feeling and dizziness. She has a heavy pressure/ and feeling like her head is going to explode. Was just dc from hospital. Her MRI was normal. States that these symptoms didn't start until she took the 50 mg dose of the topiramate. She never had these symptoms on the 25 mg dose BID.  She got nauseous and vommited once while on the phone call with me. She only took the 25 mg this morning. I advised I didn't feel convinced that this was the medication but for now, reduce back to 25 mg twice a day since she tolerated that previously. Advised she should make sure that she is staying hydrated and should make sure water intake is up.  Advised if these symptoms she may need to go back to urgent care or ER.  They did advise she has a PCP appt this morning. Informed her that she should really discuss everything at PCP and rule out if there is something viral that is going on. Pt declines having been exposed to a virus. Lab work completed in ER didn't indicated infection. Informed I would pass this along to [facility] for her to further assess and make recommendations if needed. She is scheduled on Monday for a follow up here as requested by Neuro hospitalist at dc.

## 2023-03-23 NOTE — Telephone Encounter (Signed)
Pt's son called again stating that the pt is getting worse and since they increased her Topamax it made her vomit. Pt states that she hears like a noise that gets louder like it's about to explode but then it goes away. After a couple of min it will come back and she starts getting like a desperate feeling not quite anxiety. Pt would like to be advised.

## 2023-03-23 NOTE — Telephone Encounter (Signed)
Due to concern of recent possible seizure, would recommend she try topiramate 25mg  AM and 50mg  PM.  If symptoms persist on this dosage after 1-2 days, can go back to 25mg  BID over the weekend and can discuss other treatment options at follow up visit on Monday.

## 2023-03-23 NOTE — Progress Notes (Signed)
[facility] [address] [place]. [place] [address] [phone]       OFFICE FOLLOW UP NOTE  [name] Date of Birth:  Jan 02, 1976 Medical Record Number:  [id number]   Reason for visit: Seizure and stroke follow-up    SUBJECTIVE:   CHIEF COMPLAINT:  Recent seizure   HPI:   Update 03/23/2023 JM: Patient returns for sooner follow-up visit due to recent ED visit.  She is accompanied by her niece and [place] interpreter.  She presented to [facility] ED on 10/1 with right sided numbness and weakness, similar presentation 02/2022 and presumed secondary to seizure.  MRI brain negative for acute finding.  Evaluated by neurology Dr. 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  Recommended increased topiramate dosage from 25mg  BID to 50 mg BID and continuation of levetiracetam 1000/1500.   Patient called 10/3 reporting issues with increased topiramate dosage, reported nausea with vomiting, head pressure, lightheadedness and dizziness sensation. She felt due to higher topiramate dosage as symptoms presented after increasing dose.  Patient self decreased topiramate dosage back to 25 mg BID and noted improvement of symptoms along with Zofran for nausea (rx'd by PCP). In setting of recent seizure, she was advised to increase nighttime dosage to 50mg  and continue 25mg  AM.   Denies any additional seizure activity. Is currently on topamax 25mg  AM and 50mg  PM, reports tolerating well over the past several days but last night started to have return of floating/lightheadedness sensation. Does admit to limted water intake, about 16-32oz per day.  Also notes initially feeling intense pressure in her head but this has been gradually improving over the past few days. Remains on keppra 1000/1500.       History provided for reference purposes only Update 03/08/2023 JM: Patient returns for follow-up visit accompanied by daughter and [facility] health  interpreter.  No recent seizure activity on Keppra 1000/1500, no side effects.  Remains on topiramate 25 mg twice daily for headache prophylaxis, does have occasional headaches about once every 1-3 months.   Right sided numbness present with increased activity still. 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  Stable right peripheral visual impairment.  Compliant on aspirin and atorvastatin.  Routinely follows with PCP for stroke risk factor management.   Update 08/31/2022 JM: Patient returns for 38-month follow-up.  She is accompanied by [facility] health interpreter.    Denies any recurrent seizure activity, remains on Keppra 1000/1500, denies side effects.  She occasionally continues to miss dosages but has been working on trying to be more diligent about this.  She reports continued right-sided numbness especially with increased exertion and will improve after resting.  At times her right hand will become numb and start to shake, can last all day. No always associated with full side right-sided numbness and denies any association with headaches. Usually in setting of increased stressors. Also mentions low back pain but denies radiating symptoms, also notes pain in both hands. Plans on scheduling f/u visit with PCP to further discuss.   Headaches have improved since prior visit, having about 1 headache every 2 months. Continues on topamax 25mg  twice daily, tolerating well. Can have headaches if thinking too much or trying to concentrate. Will use tylenol with benefit.   Denies any new stroke/TIA symptoms.  Continued right peripheral impairment, stable since prior visit. Compliant on aspirin and atorvastatin.  Blood pressure well-controlled.  Routinely follows with PCP.  Update 03/07/2022 JM: Patient returns for acute visit due to recent possible seizure.  She is  accompanied by [facility] health interpreter.  She presented to [facility] ED on 9/13 complaining of episode of distress, describing it as a rising sensation in her stomach followed by a feeling of "despair" as well as weakness and numbness in both arms and legs but difficulty fully describing, while in ED multiple episodes where she became quite distressed and she would hold her right leg flexed, could be easily repositioned and able to move it voluntarily without increased tone during episode.  Evaluated by neurologist Dr. Amada Jupiter with symptoms consistent with epigastric rising/sense of impending doom concerning a partial seizure however multiple findings on exam which could be suspicious for nonorganic etiology of these events.  CT head and CTA head/neck unremarkable.  She did receive 2 mg of IV Ativan and Keppra load and remained episode free for multiple hours.  Did miss a dose of her medication that morning and due to difficulty tolerating higher dosage, recommended continuation of current Keppra dosage.  She does report continued right sided numbness which has been persistent since she was in the hospital (describes complete numbness side of face, arm and leg). Has improved some since discharge. She has not had any additional seizure like activity since discharge. She does confirm she did miss her morning dose of keppra prior to her seizure.  She has not missed any dosages since recent ED visit.  She does continue to experience headaches which was discussed at prior visit, did obtain lab work which did not show any signs of anemia (headaches previously present while anemic). She is interested in starting prophylactic therapy.  Previously taking topiramate with benefit.  Remains on aspirin and atorvastatin.  Closely followed by PCP.  No further concerns at this time.   Update 03/01/2022 JM: Patient returns for 17-month stroke and seizure follow-up accompanied by [facility] health interpreter.  Overall stable without any  reoccurring seizure activity or new stroke/TIA symptoms.  She does complain of recurrence of typical frontal headaches which have worsened over the past 4 months.  Previously associated with iron deficiency anemia.  Does report having issues with heavy menstrual cycles over the past few months and ended up undergoing hysterectomy on 8/3.  She did receive iron infusion back in July, has not had repeat lab work since that time.  She is scheduled to follow-up with OB next week.  Compliant on Keppra 1000/1500, denies side effects.  Compliant on aspirin and atorvastatin, denies side effects.  Blood pressure today 110/60.  No further concerns at this time  Update 09/14/2021 JM: Patient returns for acute visit due to recent seizure activity.  She is accompanied by her sister and [facility] interpreter.  She presented to [facility] ED on 09/10/2021 for a witnessed generalized tonic-clonic seizure lasting approximately 5 minutes.  She was confused and postictal upon arrival.  She had additional tonic-clonic seizure in ED lasting approximately 1 to 2 minutes, received IV Ativan and Keppra load.  Lab work largely within normal limits and CTH no acute abnormality.  Due to likely unprovoked seizure, Keppra dosage increased to 1500 mg twice daily.  Since discharge, she has been doing well without any reoccurring seizure activity.  Remains on Keppra 1500 mg twice daily but does complain on dizziness and nauseous since starting which was a prior compliant on higher keppra dosage. Prior to seizure onset, denies any missed dosages, significantly elevated stress levels, recent  medication changes or any potential seizure provoking triggers.   Update 07/26/2021 JM: Returns for follow-up visit after prior visit 4 months ago with Dr. Pearlean Brownie for recent breakthrough seizure and hx of stroke. She is accompanied by a friend and [facility] interpreter.  Overall stable since prior visit without any additional seizure activity, compliant on  Keppra 500/1000mg  without side effects (prior difficulty tolerating 1000mg  BID dosing which improved after dose adjustment). EEG 04/2021 unremarkable without evidence of seizures.  Stable from stroke standpoint without new stroke/TIA symptoms and stable residual deficit of right peripheral visual impairment.  Compliant on aspirin and atorvastatin without side effects.  Blood pressure today 106/72. At prior visit, topamax discontinued as no headaches but shortly after stopping, expericing reoccurring headaches - PCP restarted Topamax, headaches stable but can occur with increased stress. Recently started on venlafaxine for premenopausal symptoms.  No new concerns at this time.   Update 04/07/2021 Dr. Pearlean Brownie Patient returns for follow-up after last visit 2 months ago.  She is accompanied by brother and Spanish language interpreter was present throughout this visit.  Patient was seen in the ER on 04/03/2021 with breakthrough seizures.  She presented with visual problems which was similar to what she had during her previous episode of venous sinus thrombosis and subsequently went on to have multiple seizures.  Patient denied missing any recent doses of medicines.  She was loaded with 2 g of Keppra and Ativan 2 mg.  Her maintenance dose of Keppra was increased to 1 g twice daily.  Patient states she does not remember the episode of the hospital visit.  She states she is done well since then and has not had any further recurrent seizures but she complains of feeling dizzy and having nausea and trouble tolerating the higher dose.  She did also have CT scan of the head as well as CT angiogram of the neck and brain both of which were unremarkable on 04/03/2021.  Patient denies significant headaches or numbness but she is on Topamax 25 mg twice daily and is wondering if she can stop it.  She has no new complaints today.  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  Update 01/18/2021 JM: Ms. Luskin returns for 42-month stroke follow-up accompanied by sister and [facility] interpreter.  She was evaluated at [facility] ED 7/29 for dizziness and occipital and frontal headache associated with bright lights in right eye over the past week.  Seen in ED for similar symptoms on 3/7 and diagnosed with iron deficiency anemia possibly in setting of heavy menstrual cycle.  Started menstrual cycle on 7/23 but symptoms much worse compared to prior symptoms. Dizziness improved after meclizine and received IM Reglan for headache.  CT head unremarkable for acute findings.  Lab work unremarkable.  Symptoms have since resolved.  Is scheduled for IUD implant on 8/8 due to continued heavy irregular menstrual cycles.  Of note, she did have IUD placed 7/12 to help control menstrual cycles with heavy bleeding.  She describes the dizziness as a lightheaded feeling.  She has had vertigo in the past and reports different dizziness symptoms associated with menstrual cycle.  Residual stroke deficit right peripheral visual impairment stable.  Denies any other new stroke/TIA symptoms. Compliant on warfarin with reported heavy menstrual cycle since starting and atorvastatin without associated side effects. Blood pressure today 110/66. Reports complaince on keppra 500mg  BID  without associated side effects and denies any seizure activity.  No further concerns at this time.  Update 09/09/2020 JM: Mrs. Christner returns for stroke follow-up after prior visit approximately 4 months ago accompanied by interpreter.   Residual deficits of mild right peripheral visual impairment but is improving.  She denies residual right hand numbness Evaluated in the ED 08/24/2020 with headache, dizziness and visual complaints. MRI and MRV unremarkable for acute findings.  Noted to be anemic with low iron levels which was felt possibly related to heavy menstrual cycles. Headache and  dizziness have since resolved. She has since f/u with OB/GYN with plans on placing IUD to help control menstrual cycles.  Also follow-up with PCP who initiated ferrous sulfate. Has f/u next week for repeat lab work Denies new stroke/TIA symptoms  reports bilateral hand finger joint pain with increased stiffness and quick lasting "tremor" with extension of the fingers upon awakening which has been present over the past month.  Denies any weakness, pain in other locations such as wrist or arm or numbness/tingling  Continues on Keppra 500 mg twice daily -tolerating denies any recent seizure activity  Remains on warfarin with INR levels monitored by PCP- per patient, INR level stable - unable to view via epic - aside from heavy menstrual cycles, no other bleeding or bruising concerns Remains on atorvastatin -denies associated side effects  Blood pressure today 110/67 - does not routinely monitor at home  No further concerns at this time  Initial visit 05/12/2020 JM: Ms. Elick is being seen for hospital follow-up accompanied by her brother and interpreter. She reports she has been improving but continues to have right hand numbness, right peripheral visual impairment and gait impairment due to visual difficulty. She has not done any therapies since discharge due to lack of insurance.  She is in the process of applying for [employer] financial assistance as well as other insurance..  Denies new or worsening stroke/TIA symptoms. 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  She lives with her husband and 2 sons able to maintain ADLs independently and has been slowly returning to completing IADLs.  She continues on Keppra 500 mg twice daily tolerating well and denies any reoccurring seizure activity.  She remains on warfarin without bleeding or bruising with INR levels monitored by PCP weekly. Per patient, INR levels have been greater than 3 so currently doing  dosage adjustments per PCP. Remains on atorvastatin without myalgias. Blood pressure today 114/68 - not routinely monitored at home.  She has since discontinued OCP.  No further concerns at this time.  Stroke admission 03/25/2020 JM: Ms. LONELL KRAMM is a 47 y.o. female with no PMH  who presented to [facility] with HA and neck stiffness on 03/25/2020.   Personally reviewed hospitalization pertinent progress notes, lab work and imaging with summary provided.  Shortly transferred to [facility] and evaluated by Dr. 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  Repeat CT head showed hemorrhagic transformation L parietal with slight increase edema with most hemorrhagic foci unchanged and single new hemorrhage at vertex.  Repeat CT head 3 hours later stable. Pan CT negative for malignancy but did show B renal vein nonocclusive thrombosis. CTV and CTA h/n negative LVO with partial thrombosis of the superior sagittal sinus at the vertex, nonocclusive as well can be demonstrated around the  margins of the thrombus with patency beyond that.  Also showed thrombosed superficial draining vein of the left again visible, similar in appearance with low density edema slightly increased and mild swelling with mass-effect and 1 to 2 mm of left-to-right shift and flattening of the left lateral ventricle.  Questionable hypercoagulable state with hypercoagulable showed anticardiolipin IgM 19 (indeterminate) and protein S total and active mildly low but likely inaccurate on heparin.  Advise to discontinue OCP use.  Placed on heparin IV and warfarin daily. GTC in ER s/p Ativan and Keppra load and initiated Keppra 500 mg twice daily with LTM showing left frontal temporal continuous slowing and bilateral parieto-occipital spikes.  LDL UTC with direct LDL of 151 no statin PTA and initiated  atorvastatin 40 mg daily.  No prior history of HTN or DM.  Other stroke risk factors include history of EtOH use and overweight but no prior stroke history.  Other active problems include anemia, leukocytosis and hypokalemia.  Evaluated by therapies and recommended discharge to CIR for ongoing therapy needs.  She was discharged to CIR on 03/31/2020 and discharged home on 04/03/2020 on Keppra, Topamax and warfarin.    She unfortunately returned on 04/04/2020 with complaint of headache, generalized weakness and "faintness" as well as nauseated and several episodes of emesis but no focal deficits noted.  She was found to have worsening vasogenic edema in left parieto-occipital region and worsening midline shift of 13 mm.  Warfarin discontinued and reversed with vitamin K.  Administering mannitolx1, Decadron, hypertonic saline and heparin drip.  Repeat CT head showed improved MLS, cerebral edema and decreased hydrocephalus and again repeat CT head showed stable edema with decreasing IPH and same 1 cm midline shift. NSG on board PRN.  Also found to have severe anemia receiving 2U PRBC with improvement potentially contributing to syncopal event on 10/15 at home.  Elevated liver enzymes of unclear etiology possibly related to Topamax therefore DC'd.  Restarted warfarin with INR goal 2-3 in addition to Lovenox for total of 4 doses.  Advised to follow-up with [facility] on 10/25 for pro time blood test and CBC as well as INR levels and adjustment of warfarin as indicated and possible discontinuation of Lovenox.  Discharged on dexamethasone.  Reevaluated by therapy recommended outpatient OT and no PT needs.  She was discharged home on 04/11/2020.   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 hemorrhagic infarct w/ surrounded edema MRI  Superior  sagittal sinus thrombosis w/ hemorrhagic transformation L occipital venous infarct. Also thrombosis draining cortical vein. 2 areas enhancement in L occipital hemorrhage possible ongoing hemorrhage. Similar edema w/ slight R shift. CTA and CTV 10/9 - Partial thrombosis of the superior sagittal sinus at the vertex, Thrombosed superficial draining vein on the left, Hemorrhagic infarction in the left parietal cortical and subcortical brain  Pan CT neg for malignancy, B renal vein nonocclusive thrombosis. CTV 10/16 Decreased volume of nonocclusive superior sagittal sinus Thrombus. Decreased conspicuity of thrombus within an adjacent left cortical vein. Repeat CT head improved MLS, cerebral edema and decreased hydrocephalus  2D Echo EF 60-65%. No source of embolus  LE dopplers 10/19 - no DVT  LDL UTC, TC 210, TG 132, direct LDL 151.9 HgbA1c 5.6 UDS +benzos Hypercoagulable labs unremarkable (anticardiolipin IgM slightly elevated, prot S total and activity mildly low - not accurate on heparin) VTE prophylaxis -  heparin IV   No antithrombotic prior to admission, now on full dose lovenox. On coumadin with INR goal 2-3.  Today INR 1.5 Decadron 4mg  IV q6h ->2mg  IV q 6h -> 2mg  po Q6h -> 2mg  Q8  Therapy recommendations:  Outpt OT, no PT - gave pt info for pro bono therapies at [facility] clinic Disposition:  home  Cerebral Edema CT head Left parieto-occipital venous infarct with increased edema and mass effect including 13 mm of rightward midline shift. New mild dilatation of the right lateral ventricle suggesting early trapping Repeat CT improved MLS, cerebral edema and decreased hydrocephalus CT repeat stable edema, decreasing IPH. Same 1cm midline shift  NSG on board PRN S/p mannitol x 1 Decadron 10mg  x 1 -> 4mg  Q6 -> 2mg  Q6 ->2mg  Q8 -> tapering On 3% at 75 -> 40 cc / hr -> 20cc->off Increase topomax to 50mg  bid -> d/c PICC 10/18->10/22 removed      ROS:   14 system review of systems performed  and negative with exception of those listed in HPI  PMH:  Past Medical History:  Diagnosis Date   Anemia    Hemorrhagic cerebrovascular accident (CVA) (HCC) 03/25/2020   Seizures (HCC)    Stroke (HCC)    Thrombosis of superior sagittal sinus 03/25/2020   a.) thrombosis of the posterior aspect of the superior sagittal sinus with hemorrhagic transformation of a venous infarct in the LEFT occipital lobe; (+) vasogenic edema with regional mass effect resulting in LEFT to RIGHT midline shift    PSH:  Past Surgical History:  Procedure Laterality Date   VAGINAL HYSTERECTOMY N/A 01/20/2022   Procedure: HYSTERECTOMY VAGINAL WITH BILATERAL SALPINGECTOMY;  Surgeon: Suzy Bouchard, MD;  Location: [facility] ORS;  Service: Gynecology;  Laterality: N/A;    Social History:  Social History   Socioeconomic History   Marital status: Married    Spouse name: Not on file   Number of children: Not on file   Years of education: Not on file   Highest education level: Not on file  Occupational History   Not on file  Tobacco Use   Smoking status: Never   Smokeless tobacco: Never  Vaping Use   Vaping status: Never Used  Substance and Sexual Activity   Alcohol use: Not Currently   Drug use: Never   Sexual activity: Not on file  Other Topics Concern   Not on file  Social History Narrative   Not on file   Social Determinants of Health   Financial Resource Strain: Not on file  Food Insecurity: No Food Insecurity (03/22/2023)   Hunger Vital Sign    Worried About Running Out of Food in the Last Year: Never true    Ran Out of Food in the Last Year: Never true  Transportation Needs: No Transportation Needs (03/22/2023)   PRAPARE - Administrator, Civil Service (Medical): No    Lack of Transportation (Non-Medical): No  Physical Activity: Not on file  Stress: Not on file  Social Connections: Not on file  Intimate Partner Violence: Not At Risk (03/22/2023)   Humiliation, Afraid, Rape,  and Kick questionnaire    Fear of Current or Ex-Partner: No    Emotionally Abused: No    Physically Abused: No    Sexually Abused: No    Family History:  Family History  Problem Relation Age of Onset   Breast cancer Neg Hx     Medications:   Current Outpatient Medications on File Prior to Visit  Medication Sig Dispense Refill   acetaminophen (TYLENOL) 500 MG tablet Take 500 mg by mouth as needed.     aspirin EC 81 MG tablet Take 1 tablet (81 mg total) by mouth daily. Swallow whole. 30 tablet 0   atorvastatin (LIPITOR) 40 MG tablet Take 40 mg by mouth daily.     hydrocortisone 2.5 % cream Apply 1 Application topically 2 (two) times daily.     levETIRAcetam (KEPPRA) 500 MG tablet Take 2 tablets (1,000 mg total) by mouth every morning AND 3 tablets (1,500 mg total) at bedtime. 450 tablet 3   topiramate (TOPAMAX) 50 MG tablet Take 1 tablet (50 mg total) by mouth 2 (two) times daily. 60 tablet 1   MELATONIN MAXIMUM STRENGTH 5 MG TABS Take 5 mg by mouth at bedtime. (Patient not taking: Reported on 03/27/2023)     No current facility-administered medications on file prior to visit.    Allergies:  No Known Allergies    OBJECTIVE:  Physical Exam  Vitals:   03/27/23 0955  BP: 122/69  Pulse: 60  Weight: 133 lb 6.4 oz (60.5 kg)  Height: 5' (1.524 m)   Body mass index is 26.05 kg/m. No results found.  General: well developed, well nourished, very pleasant middle-aged Hispanic female, seated, in no evident distress  Neurologic Exam Mental Status: Awake and fully alert. Limited English primarily Spanish-speaking. Denies dysarthria or aphasia.  Oriented to place and time. Recent and remote memory intact. Attention span, concentration and fund of knowledge appropriate. Mood and affect appropriate.  Cranial Nerves: Pupils equal, briskly reactive to light. Extraocular movements full without nystagmus. Visual fields right homonymous inferior quadrantanopia. Hearing intact. Face, tongue,  palate moves normally and symmetrically.  Motor: Normal bulk and tone. Normal strength in all tested extremity muscles. Sensory.: intact to touch , pinprick , position and vibratory sensation Coordination: Rapid alternating movements normal in all extremities. Finger-to-nose and heel-to-shin performed accurately bilaterally.   Gait and Station: Arises from chair without difficulty. Stance is normal. Gait demonstrates unsteadiness (due to lightheadedness sensation).  Tandem walk and heel toe not attempted Reflexes: 1+ and symmetric. Toes downgoing.       ASSESSMENT: CALEYA RYER is a 47 y.o. 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 On 04/04/2020 (d/c'd home from CIR on 10/15 in stable condition) with worsening headache, N/V and generalized weakness found to have worsening vasogenic edema in left parieto-occipital region and worsening midline shift of 13 mm requiring reversal of warfarin, mannitol x1, Decadron and hypertonic saline with improvement.  Hospital course complicated by severe anemia, leukocytosis and fever, and elevated liver enzymes. Vascular risk factors include anemia, HLD and OCP use.  Recurrent frontal headaches usually associated with iron deficiency anemia which greatly improved on topiramate.  Recent breakthrough seizure on 10/1, eval in ED, recommended continuation of Keppra and increased topiramate dosage from 25mg  BID to 50mg  BID but difficulty tolerating with reports of lightheadedness and nausea     PLAN:  Sagittal sinus thrombosis w/ hemorrhagic transformation:  Residual deficit: Right inferior homonymous quadrantanopia MRV 08/24/2020 no evidence of residual thrombosis Continue aspirin 81 mg daily and atorvastatin for secondary  stroke prevention.  Discussed secondary stroke prevention measures and importance of close PCP follow up for aggressive stroke risk factor management including HLD with LDL goal<70 Repeat hypercoagulable  labs: Slightly elevated anticardiolipin IgM (14) likely clinically insignificant  Right-sided numbness: Has been persistent since seizure-like activity on 03/02/2022 with some improvement and now more associated with increased exertion.  CTH and CTA head/neck 03/02/2022 unremarkable.  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 Intolerant to 1500mg  BID dosing Recommend initiating lamotrigine with gradual titration to 100 mg twice daily -instructions provided in AVS Discontinue topiramate due to reported side effects, currently low dose, no indication to taper, recommending more rapid discontinuation due to side effects (vs waiting to get to full dose of lamotrigine dosage prior to stopping) - advised to increase water intake but if lightheadedness sensation persists after stopping topiramate, advised to follow-up with PCP for further evaluation Per patient/niece request, will prescribe Valtoco 15mg  (based on weight) to use as needed for breakthrough seizure.  Last seizure activity 03/02/2022 likely provoked in setting of missing AM keppra dosage Breakthrough seizure 09/06/2021 -witnessed tonic-clonic seizure lasting 5 minutes; prior sz 03/1021 EEG 09/2021 unremarkable Discussed avoiding seizure provoking triggers and ensuring medication compliance Advised to call with any recurrent seizure activity. Discussed indications to call 911 with seizure activity  Chronic frontal  headaches Overall stable Previously on topiramate but now difficulty tolerating Should get some benefit for headache prevention with adding lamotrigine but advised to call if headaches worsen to discuss other treatment options Use of tylenol as needed, advised to call if this should no longer be beneficial and can consider use of Nurtec or Ubrelvy for rescue, triptans contraindicated due to stroke history    Follow-up in 6 months or call earlier if needed    CC:  Hillery Aldo, MD    I spent 45 minutes of face-to-face and non-face-to-face time with patient and niece assisted by [facility] health interpreter.  This included previsit chart review, lab review, study review, order entry, electronic health record documentation, patient education and discussion regarding above diagnoses and treatment plan and answered all the questions to patient and niece satisfaction  Ihor Austin, [facility]  [facility] [address] [place], [place] [address]  Phone [phone] Fax [phone] Note: This document was prepared with digital dictation and possible smart phrase technology. Any transcriptional errors that result from this process are unintentional.

## 2023-03-23 NOTE — Telephone Encounter (Signed)
Erica Rosario the interpreter line and spoke with Erica Rosario who was able to connect to the patinet. She is feeling some better since we spoke this morning. Her pcp was able to get her something to help with her nausea. Advised that since she had a seizure and the dosage was increased, Erica Rosario would like for the patient to try taking the 25 mg in the am and the 50 mg in the evening. Pt was nervous because of it making her sick to her stomach. Recommended that tonight 30 min before she takes the 50 mg topiramate try taking her medication for nausea and then hopefully this will help her not to get sick on her stomach.  Pt aware if symptoms persists she can decrease to the 25 mg twice a day and we can readdress on Monday. Pt verbalized understanding.

## 2023-03-27 ENCOUNTER — Encounter: Payer: Self-pay | Admitting: Adult Health

## 2023-03-27 ENCOUNTER — Ambulatory Visit (INDEPENDENT_AMBULATORY_CARE_PROVIDER_SITE_OTHER): Payer: 59 | Admitting: Adult Health

## 2023-03-27 VITALS — BP 122/69 | HR 60 | Ht 60.0 in | Wt 133.4 lb

## 2023-03-27 DIAGNOSIS — I639 Cerebral infarction, unspecified: Secondary | ICD-10-CM | POA: Diagnosis not present

## 2023-03-27 DIAGNOSIS — G08 Intracranial and intraspinal phlebitis and thrombophlebitis: Secondary | ICD-10-CM

## 2023-03-27 DIAGNOSIS — G40909 Epilepsy, unspecified, not intractable, without status epilepticus: Secondary | ICD-10-CM | POA: Diagnosis not present

## 2023-03-27 DIAGNOSIS — R519 Headache, unspecified: Secondary | ICD-10-CM

## 2023-03-27 MED ORDER — VALTOCO 15 MG DOSE 7.5 MG/0.1ML NA LQPK: 15.0000 mg | NASAL | 0 refills | Status: DC | PRN

## 2023-03-27 MED ORDER — LAMOTRIGINE 100 MG PO TABS: 100.0000 mg | ORAL_TABLET | Freq: Every day | ORAL | 5 refills | Status: DC

## 2023-03-27 MED ORDER — LAMOTRIGINE 25 MG PO TABS: ORAL_TABLET | ORAL | 0 refills | Status: DC

## 2023-03-27 NOTE — Patient Instructions (Addendum)
Your Plan:  Stop topamax and will start on lamotrigine with gradual titration to 100mg  twice daily   Gradually increase lamotrigine 25mg  tablet as below:  Start 25mg  twice daily for 1 week Increase to 50mg  twice daily (2 tablets twice a day) for 1 week Increase to 75mg  twice daily (3 tablets twice a day) for 1 week  Increase to 100mg  twice daily (4 tablets twice a day) for 1 week  I will be sending in a prescription to pick up in 4 weeks for 100mg  tablets of lamotrigine - you do not need to pick this up yet incase you have any difficulty tolerating   Please increase your water intake to at least 64-100oz of water per day as this can help with lightheadedness/floating sensation  If symptoms persist, please follow up with your primary doctor for further evaluation  Continue Keppra 1000mg  AM and 1500mg  PM  Please call with any recurrent seizure activity   Please let me know if headaches worsen after stopping topamax      Follow up in 6 months or call earlier if needed     Thank you for coming to see [place] at [facility]. I hope we have been able to provide you high quality care today.  You may receive a patient satisfaction survey over the next few weeks. We would appreciate your feedback and comments so that we may continue to improve ourselves and the health of our patients.

## 2023-04-05 ENCOUNTER — Encounter: Payer: Self-pay | Admitting: Internal Medicine

## 2023-04-20 ENCOUNTER — Telehealth: Payer: Self-pay | Admitting: Adult Health

## 2023-04-20 NOTE — Telephone Encounter (Signed)
Returned pt's call and informed that her Lamictal Rx was already in the pharmacy, advised that she needs to request the Lamictal 100 mg Rx. Pt verbalized understanding. Information was provided to patient in Spanish.

## 2023-04-20 NOTE — Telephone Encounter (Signed)
Pt is requesting a refill for lamoTRIgine (LAMICTAL) 100 MG tablet .  Pharmacy: [facility]

## 2023-05-05 ENCOUNTER — Telehealth: Payer: Self-pay | Admitting: Diagnostic Neuroimaging

## 2023-05-05 MED ORDER — LAMOTRIGINE 100 MG PO TABS: 100.0000 mg | ORAL_TABLET | Freq: Two times a day (BID) | ORAL | 12 refills | Status: DC

## 2023-05-05 MED ORDER — LAMOTRIGINE 100 MG PO TABS: 100.0000 mg | ORAL_TABLET | Freq: Two times a day (BID) | ORAL | Status: DC

## 2023-05-05 NOTE — Telephone Encounter (Signed)
Patient called in to clarify lamotrigine prescription.  100 mg once a day was sent in but it should be 100 mg twice a day.  I sent in the correct prescription.  Meds ordered this encounter  Medications   DISCONTD: lamoTRIgine (LAMICTAL) 100 MG tablet    Sig: Take 1 tablet (100 mg total) by mouth 2 (two) times daily.    Dispense:  60 tablet    Refill:  12    Please do not fill until after completion of lamotrigine 25mg  tablet titration completed (should take 28 days to complete)   lamoTRIgine (LAMICTAL) 100 MG tablet    Sig: Take 1 tablet (100 mg total) by mouth 2 (two) times daily.   Suanne Marker, MD 05/05/2023, 12:27 PM Certified in Neurology, Neurophysiology and Neuroimaging  [facility] [address], [address] [place], [place] [address] [phone]

## 2023-05-08 NOTE — Telephone Encounter (Signed)
Looks like lamotrigine was previously filled for 100mg  daily for some reason but yes, should be 100mg  BID. Thank you.

## 2023-05-08 NOTE — Telephone Encounter (Signed)
Pt was informed that the new Rx for lamotrigine 100 mg BID, was sent to her pharmacy. Pt stated she is taking the medication twice a day. Pt had no additional questions and thanked me for calling.

## 2023-10-16 ENCOUNTER — Ambulatory Visit (INDEPENDENT_AMBULATORY_CARE_PROVIDER_SITE_OTHER): Payer: 59 | Admitting: Adult Health

## 2023-10-16 ENCOUNTER — Encounter: Payer: Self-pay | Admitting: Adult Health

## 2023-10-16 VITALS — BP 108/64 | HR 76 | Ht 60.0 in | Wt 141.0 lb

## 2023-10-16 DIAGNOSIS — R200 Anesthesia of skin: Secondary | ICD-10-CM | POA: Diagnosis not present

## 2023-10-16 DIAGNOSIS — I639 Cerebral infarction, unspecified: Secondary | ICD-10-CM | POA: Diagnosis not present

## 2023-10-16 DIAGNOSIS — R519 Headache, unspecified: Secondary | ICD-10-CM

## 2023-10-16 DIAGNOSIS — G08 Intracranial and intraspinal phlebitis and thrombophlebitis: Secondary | ICD-10-CM

## 2023-10-16 DIAGNOSIS — R569 Unspecified convulsions: Secondary | ICD-10-CM

## 2023-10-16 MED ORDER — LAMOTRIGINE 100 MG PO TABS: 100.0000 mg | ORAL_TABLET | Freq: Two times a day (BID) | ORAL | 3 refills | Status: DC

## 2023-10-16 MED ORDER — LEVETIRACETAM 500 MG PO TABS: ORAL_TABLET | ORAL | 3 refills | Status: DC

## 2023-10-16 NOTE — Patient Instructions (Addendum)
 Your Plan:  Continue Keppra  1000mg  AM and 1500mg  PM  Continue Lamictal  100 mg twice daily  Continue stroke prevention medications and continue routine follow-up with PCP for stroke risk factor management      Follow up in 9 months or call earlier if needed      Thank you for coming to see us  at [facility]. I hope we have been able to provide you high quality care today.  You may receive a patient satisfaction survey over the next few weeks. We would appreciate your feedback and comments so that we may continue to improve ourselves and the health of our patients.

## 2023-10-16 NOTE — Progress Notes (Signed)
 [name]       OFFICE FOLLOW UP NOTE  [name] Date of Birth:  1976-01-13 Medical Record Number:  [id number]   Reason for visit: Seizure and stroke follow-up    SUBJECTIVE:   CHIEF COMPLAINT:  Chief Complaint  Patient presents with   Cerebrovascular Accident    Rm 3 with spouse and [facility] interpreter Pt is well and stable, reports no new sz or stroke concerns.       HPI:   Update 10/16/2023 Erica Rosario: Patient returns for follow-up visit accompanied by her spouse, assisted by [facility] interpreter.  Denies any additional seizure activity.  Reports compliance on lamotrigine  100 mg twice daily and Keppra  1000/1500, tolerating without side effects.  Only experiencing occasional headaches, not severe, lasts only short duration.  No new stroke/TIA symptoms.  Reports compliance on aspirin  and atorvastatin .  Reports routine follow-up with PCP.  No questions or concerns at this time.     History provided for reference purposes only Update 03/23/2023 Erica Rosario: Patient returns for sooner follow-up visit due to recent ED visit.  She is accompanied by her niece and [facility] interpreter.  She presented to [facility] ED on 10/1 with right sided numbness and weakness, similar presentation 02/2022 and presumed secondary to seizure.  MRI brain negative for acute finding.  Evaluated by neurology Dr. 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  Recommended increased topiramate  dosage from 25mg  BID to 50 mg BID and continuation of levetiracetam  1000/1500.   Patient called 10/3 reporting issues with increased topiramate  dosage, reported nausea with vomiting, head pressure, lightheadedness and dizziness sensation. She felt due to higher topiramate  dosage as symptoms presented after increasing dose.  Patient self decreased topiramate  dosage back to 25 mg BID and noted improvement of symptoms along  with Zofran  for nausea (rx'd by PCP). In setting of recent seizure, she was advised to increase nighttime dosage to 50mg  and continue 25mg  AM.   Denies any additional seizure activity. Is currently on topamax  25mg  AM and 50mg  PM, reports tolerating well over the past several days but last night started to have return of floating/lightheadedness sensation. Does admit to limted water intake, about 16-32oz per day.  Also notes initially feeling intense pressure in her head but this has been gradually improving over the past few days. Remains on keppra  1000/1500.   Update 03/08/2023 Erica Rosario: Patient returns for follow-up visit accompanied by daughter and [place] interpreter.  No recent seizure activity on Keppra  1000/1500, no side effects.  Remains on topiramate  25 mg twice daily for headache prophylaxis, does have occasional headaches about once every 1-3 months.   Right sided numbness present with increased activity still. 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  Stable right peripheral visual impairment.  Compliant on aspirin  and atorvastatin .  Routinely follows with PCP for stroke risk factor management.   Update 08/31/2022 Erica Rosario: Patient returns for 79-month follow-up.  She is accompanied by [facility] health interpreter.    Denies any recurrent seizure activity, remains on Keppra  1000/1500, denies side effects.  She occasionally continues to miss dosages but has been working on trying to be more diligent about this.  She reports continued right-sided numbness especially with increased exertion and will improve after resting.  At times her right hand will become numb and start to shake, can last all  day. No always associated with full side right-sided numbness and denies any association with headaches. Usually in setting of increased stressors. Also mentions low back pain but  denies radiating symptoms, also notes pain in both hands. Plans on scheduling f/u visit with PCP to further discuss.   Headaches have improved since prior visit, having about 1 headache every 2 months. Continues on topamax  25mg  twice daily, tolerating well. Can have headaches if thinking too much or trying to concentrate. Will use tylenol  with benefit.   Denies any new stroke/TIA symptoms.  Continued right peripheral impairment, stable since prior visit. Compliant on aspirin  and atorvastatin .  Blood pressure well-controlled.  Routinely follows with PCP.  Update 03/07/2022 Erica Rosario: Patient returns for acute visit due to recent possible seizure.  She is accompanied by [facility] health interpreter.  She presented to [facility] ED on 9/13 complaining of episode of distress, describing it as a rising sensation in her stomach followed by a feeling of "despair" as well as weakness and numbness in both arms and legs but difficulty fully describing, while in ED multiple episodes where she became quite distressed and she would hold her right leg flexed, could be easily repositioned and able to move it voluntarily without increased tone during episode.  Evaluated by neurologist Dr. Alecia Ames with symptoms consistent with epigastric rising/sense of impending doom concerning a partial seizure however multiple findings on exam which could be suspicious for nonorganic etiology of these events.  CT head and CTA head/neck unremarkable.  She did receive 2 mg of IV Ativan  and Keppra  load and remained episode free for multiple hours.  Did miss a dose of her medication that morning and due to difficulty tolerating higher dosage, recommended continuation of current Keppra  dosage.  She does report continued right sided numbness which has been persistent since she was in the hospital (describes complete numbness side of face, arm and leg). Has improved some since discharge. She has not had any additional seizure like activity since discharge. She  does confirm she did miss her morning dose of keppra  prior to her seizure.  She has not missed any dosages since recent ED visit.  She does continue to experience headaches which was discussed at prior visit, did obtain lab work which did not show any signs of anemia (headaches previously present while anemic). She is interested in starting prophylactic therapy.  Previously taking topiramate  with benefit.  Remains on aspirin  and atorvastatin .  Closely followed by PCP.  No further concerns at this time.   Update 03/01/2022 Erica Rosario: Patient returns for 58-month stroke and seizure follow-up accompanied by [facility] health interpreter.  Overall stable without any reoccurring seizure activity or new stroke/TIA symptoms.  She does complain of recurrence of typical frontal headaches which have worsened over the past 4 months.  Previously associated with iron  deficiency anemia.  Does report having issues with heavy menstrual cycles over the past few months and ended up undergoing hysterectomy on 8/3.  She did receive iron  infusion back in July, has not had repeat lab work since that time.  She is scheduled to follow-up with OB next week.  Compliant on Keppra  1000/1500, denies side effects.  Compliant on aspirin  and atorvastatin , denies side effects.  Blood pressure today 110/60.  No further concerns at this time  Update 09/14/2021 Erica Rosario: Patient returns for acute visit due to recent seizure activity.  She is accompanied by her sister and [facility] interpreter.  She presented to [facility] ED on 09/10/2021 for a witnessed generalized tonic-clonic  seizure lasting approximately 5 minutes.  She was confused and postictal upon arrival.  She had additional tonic-clonic seizure in ED lasting approximately 1 to 2 minutes, received IV Ativan  and Keppra  load.  Lab work largely within normal limits and CTH no acute abnormality.  Due to likely unprovoked seizure, Keppra  dosage increased to 1500 mg twice daily.  Since discharge, she has been  doing well without any reoccurring seizure activity.  Remains on Keppra  1500 mg twice daily but does complain on dizziness and nauseous since starting which was a prior compliant on higher keppra  dosage. Prior to seizure onset, denies any missed dosages, significantly elevated stress levels, recent medication changes or any potential seizure provoking triggers.   Update 07/26/2021 Erica Rosario: Returns for follow-up visit after prior visit 4 months ago with Dr. Janett Medin for recent breakthrough seizure and hx of stroke. She is accompanied by a friend and [facility] interpreter.  Overall stable since prior visit without any additional seizure activity, compliant on Keppra  500/1000mg  without side effects (prior difficulty tolerating 1000mg  BID dosing which improved after dose adjustment). EEG 04/2021 unremarkable without evidence of seizures.  Stable from stroke standpoint without new stroke/TIA symptoms and stable residual deficit of right peripheral visual impairment.  Compliant on aspirin  and atorvastatin  without side effects.  Blood pressure today 106/72. At prior visit, topamax  discontinued as no headaches but shortly after stopping, expericing reoccurring headaches - PCP restarted Topamax , headaches stable but can occur with increased stress. Recently started on venlafaxine for premenopausal symptoms.  No new concerns at this time.   Update 04/07/2021 Dr. Janett Medin Patient returns for follow-up after last visit 2 months ago.  She is accompanied by brother and Spanish language interpreter was present throughout this visit.  Patient was seen in the ER on 04/03/2021 with breakthrough seizures.  She presented with visual problems which was similar to what she had during her previous episode of venous sinus thrombosis and subsequently went on to have multiple seizures.  Patient denied missing any recent doses of medicines.  She was loaded with 2 g of Keppra  and Ativan  2 mg.  Her maintenance dose of Keppra  was increased to 1 g twice  daily.  Patient states she does not remember the episode of the hospital visit.  She states she is done well since then and has not had any further recurrent seizures but she complains of feeling dizzy and having nausea and trouble tolerating the higher dose.  She did also have CT scan of the head as well as CT angiogram of the neck and brain both of which were unremarkable on 04/03/2021.  Patient denies significant headaches or numbness but she is on Topamax  25 mg twice daily and is wondering if she can stop it.  She has no new complaints today.  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  Update 01/18/2021 Erica Rosario: Ms. Schechter returns for 20-month stroke follow-up accompanied by sister and [facility] interpreter.  She was evaluated at [facility] ED 7/29 for dizziness and occipital and frontal headache associated with bright lights in right eye over the past week.  Seen in ED for similar symptoms on 3/7 and diagnosed with iron  deficiency anemia possibly in setting of heavy menstrual cycle.  Started menstrual cycle on 7/23 but symptoms much worse compared to prior symptoms. Dizziness improved after meclizine  and received IM Reglan  for headache.  CT head unremarkable for  acute findings.  Lab work unremarkable.  Symptoms have since resolved.  Is scheduled for IUD implant on 8/8 due to continued heavy irregular menstrual cycles.  Of note, she did have IUD placed 7/12 to help control menstrual cycles with heavy bleeding.  She describes the dizziness as a lightheaded feeling.  She has had vertigo in the past and reports different dizziness symptoms associated with menstrual cycle.  Residual stroke deficit right peripheral visual impairment stable.  Denies any other new stroke/TIA symptoms. Compliant on warfarin with reported heavy menstrual cycle since starting and atorvastatin  without associated side effects. Blood pressure  today 110/66. Reports complaince on keppra  500mg  BID without associated side effects and denies any seizure activity.  No further concerns at this time.  Update 09/09/2020 Erica Rosario: Mrs. Weidenbach returns for stroke follow-up after prior visit approximately 4 months ago accompanied by interpreter.   Residual deficits of mild right peripheral visual impairment but is improving.  She denies residual right hand numbness Evaluated in the ED 08/24/2020 with headache, dizziness and visual complaints. MRI and MRV unremarkable for acute findings.  Noted to be anemic with low iron  levels which was felt possibly related to heavy menstrual cycles. Headache and dizziness have since resolved. She has since f/u with OB/GYN with plans on placing IUD to help control menstrual cycles.  Also follow-up with PCP who initiated ferrous sulfate . Has f/u next week for repeat lab work Denies new stroke/TIA symptoms  reports bilateral hand finger joint pain with increased stiffness and quick lasting "tremor" with extension of the fingers upon awakening which has been present over the past month.  Denies any weakness, pain in other locations such as wrist or arm or numbness/tingling  Continues on Keppra  500 mg twice daily -tolerating denies any recent seizure activity  Remains on warfarin with INR levels monitored by PCP- per patient, INR level stable - unable to view via epic - aside from heavy menstrual cycles, no other bleeding or bruising concerns Remains on atorvastatin  -denies associated side effects  Blood pressure today 110/67 - does not routinely monitor at home  No further concerns at this time  Initial visit 05/12/2020 Erica Rosario: Ms. Kissler is being seen for hospital follow-up accompanied by her brother and interpreter. She reports she has been improving but continues to have right hand numbness, right peripheral visual impairment and gait impairment due to visual difficulty. She has not done any therapies since discharge due  to lack of insurance.  She is in the process of applying for [facility] financial assistance as well as other insurance..  Denies new or worsening stroke/TIA symptoms. 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  She lives with her husband and 2 sons able to maintain ADLs independently and has been slowly returning to completing IADLs.  She continues on Keppra  500 mg twice daily tolerating well and denies any reoccurring seizure activity.  She remains on warfarin without bleeding or bruising with INR levels monitored by PCP weekly. Per patient, INR levels have been greater than 3 so currently doing dosage adjustments per PCP. Remains on atorvastatin  without myalgias. Blood pressure today 114/68 - not routinely monitored at home.  She has since discontinued OCP.  No further concerns at this time.  Stroke admission 03/25/2020 Erica Rosario: Ms. VITINA RAD is a 48 y.o. female with no PMH  who presented to [facility] with HA and neck stiffness on 03/25/2020.   Personally reviewed hospitalization pertinent progress  notes, lab work and imaging with summary provided.  Shortly transferred to [facility] and evaluated by Dr. 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  Repeat CT head showed hemorrhagic transformation L parietal with slight increase edema with most hemorrhagic foci unchanged and single new hemorrhage at vertex.  Repeat CT head 3 hours later stable. Pan CT negative for malignancy but did show B renal vein nonocclusive thrombosis. CTV and CTA h/n negative LVO with partial thrombosis of the superior sagittal sinus at the vertex, nonocclusive as well can be demonstrated around the margins of the thrombus with patency beyond that.  Also showed thrombosed superficial draining vein of the left again visible, similar in appearance with  low density edema slightly increased and mild swelling with mass-effect and 1 to 2 mm of left-to-right shift and flattening of the left lateral ventricle.  Questionable hypercoagulable state with hypercoagulable showed anticardiolipin IgM 19 (indeterminate) and protein S total and active mildly low but likely inaccurate on heparin .  Advise to discontinue OCP use.  Placed on heparin  IV and warfarin daily. GTC in ER s/p Ativan  and Keppra  load and initiated Keppra  500 mg twice daily with LTM showing left frontal temporal continuous slowing and bilateral parieto-occipital spikes.  LDL UTC with direct LDL of 151 no statin PTA and initiated atorvastatin  40 mg daily.  No prior history of HTN or DM.  Other stroke risk factors include history of EtOH use and overweight but no prior stroke history.  Other active problems include anemia, leukocytosis and hypokalemia.  Evaluated by therapies and recommended discharge to CIR for ongoing therapy needs.  She was discharged to CIR on 03/31/2020 and discharged home on 04/03/2020 on Keppra , Topamax  and warfarin.    She unfortunately returned on 04/04/2020 with complaint of headache, generalized weakness and "faintness" as well as nauseated and several episodes of emesis but no focal deficits noted.  She was found to have worsening vasogenic edema in left parieto-occipital region and worsening midline shift of 13 mm.  Warfarin discontinued and reversed with vitamin K.  Administering mannitolx1, Decadron , hypertonic saline and heparin  drip.  Repeat CT head showed improved MLS, cerebral edema and decreased hydrocephalus and again repeat CT head showed stable edema with decreasing IPH and same 1 cm midline shift. NSG on board PRN.  Also found to have severe anemia receiving 2U PRBC with improvement potentially contributing to syncopal event on 10/15 at home.  Elevated liver enzymes of unclear etiology possibly related to Topamax  therefore DC'd.  Restarted warfarin with INR goal 2-3 in  addition to Lovenox  for total of 4 doses.  Advised to follow-up with [facility] on 10/25 for pro time blood test and CBC as well as INR levels and adjustment of warfarin as indicated and possible discontinuation of Lovenox .  Discharged on dexamethasone .  Reevaluated by therapy recommended outpatient OT and no PT needs.  She was discharged home on 04/11/2020.   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 Also thrombosis draining cortical vein. 2 areas enhancement in L occipital hemorrhage possible ongoing hemorrhage. Similar edema w/ slight R shift. CTA and CTV 10/9 - Partial thrombosis of the superior sagittal sinus at  the vertex, Thrombosed superficial draining vein on the left, Hemorrhagic infarction in the left parietal cortical and subcortical brain  Pan CT neg for malignancy, B renal vein nonocclusive thrombosis. CTV 10/16 Decreased volume of nonocclusive superior sagittal sinus Thrombus. Decreased conspicuity of thrombus within an adjacent left cortical vein. Repeat CT head improved MLS, cerebral edema and decreased hydrocephalus  2D Echo EF 60-65%. No source of embolus  LE dopplers 10/19 - no DVT  LDL UTC, TC 210, TG 132, direct LDL 151.9 HgbA1c 5.6 UDS +benzos Hypercoagulable labs unremarkable (anticardiolipin IgM slightly elevated, prot S total and activity mildly low - not accurate on heparin ) VTE prophylaxis - heparin  IV   No antithrombotic prior to admission, now on full dose lovenox . On coumadin  with INR goal 2-3.  Today INR 1.5 Decadron  4mg  IV q6h ->2mg  IV q 6h -> 2mg  po Q6h -> 2mg  Q8  Therapy recommendations:  Outpt OT, no PT - gave pt info for pro bono therapies at [facility] clinic Disposition:  home  Cerebral Edema CT head Left parieto-occipital venous infarct with increased edema and mass effect including 13 mm of rightward midline shift. New mild dilatation of the right lateral ventricle suggesting early trapping Repeat CT improved MLS, cerebral edema and decreased hydrocephalus CT repeat stable edema, decreasing IPH. Same 1cm midline shift  NSG on board PRN S/p mannitol  x 1 Decadron  10mg  x 1 -> 4mg  Q6 -> 2mg  Q6 ->2mg  Q8 -> tapering On 3% at 75 -> 40 cc / hr -> 20cc->off Increase topomax to 50mg  bid -> d/c PICC 10/18->10/22 removed      ROS:   14 system review of systems performed and negative with exception of those listed in HPI  PMH:  Past Medical History:  Diagnosis Date   Acute focal neurological deficit 04/02/2021   Anemia    Hemorrhagic cerebrovascular accident (CVA) (HCC) 03/25/2020   Seizures (HCC)    Stroke (HCC)    Thrombosis of superior sagittal sinus 03/25/2020   a.) thrombosis of the posterior aspect of the superior sagittal sinus with hemorrhagic transformation of a venous infarct in the LEFT occipital lobe; (+) vasogenic edema with regional mass effect resulting in LEFT to RIGHT midline shift    PSH:  Past Surgical History:  Procedure Laterality Date   VAGINAL HYSTERECTOMY N/A 01/20/2022   Procedure: HYSTERECTOMY VAGINAL WITH BILATERAL SALPINGECTOMY;  Surgeon: Carolynn Citrin, MD;  Location: [facility] ORS;  Service: Gynecology;  Laterality: N/A;    Social History:  Social History   Socioeconomic History   Marital status: Married    Spouse name: Not on file   Number of children: Not on file   Years of education: Not on file   Highest education level: Not on file  Occupational History   Not on file  Tobacco Use   Smoking status: Never   Smokeless tobacco: Never  Vaping Use   Vaping status: Never Used  Substance and Sexual Activity   Alcohol use: Not Currently   Drug use: Never   Sexual activity: Not on file   Other Topics Concern   Not on file  Social History Narrative   Not on file   Social Drivers of Health   Financial Resource Strain: Not on file  Food Insecurity: No Food Insecurity (03/22/2023)   Hunger Vital Sign    Worried About Running Out of Food in the Last Year: Never true    Ran Out of Food in the Last Year: Never true  Transportation Needs: No  Transportation Needs (03/22/2023)   PRAPARE - Administrator, Civil Service (Medical): No    Lack of Transportation (Non-Medical): No  Physical Activity: Not on file  Stress: Not on file  Social Connections: Not on file  Intimate Partner Violence: Not At Risk (03/22/2023)   Humiliation, Afraid, Rape, and Kick questionnaire    Fear of Current or Ex-Partner: No    Emotionally Abused: No    Physically Abused: No    Sexually Abused: No    Family History:  Family History  Problem Relation Age of Onset   Breast cancer Neg Hx     Medications:   Current Outpatient Medications on File Prior to Visit  Medication Sig Dispense Refill   acetaminophen  (TYLENOL ) 500 MG tablet Take 500 mg by mouth as needed.     aspirin  EC 81 MG tablet Take 1 tablet (81 mg total) by mouth daily. Swallow whole. 30 tablet 0   atorvastatin  (LIPITOR) 40 MG tablet Take 40 mg by mouth daily.     diazePAM , 15 MG Dose, (VALTOCO  15 MG DOSE) 2 x 7.5 MG/0.1ML LQPK Place 15 mg into the nose as needed. 1 each 0   hydrocortisone 2.5 % cream Apply 1 Application topically 2 (two) times daily.     No current facility-administered medications on file prior to visit.    Allergies:  No Known Allergies    OBJECTIVE:  Physical Exam  Vitals:   10/16/23 1039  BP: 108/64  Pulse: 76  Weight: 141 lb (64 kg)  Height: 5' (1.524 m)   Body mass index is 27.54 kg/m. No results found.  General: well developed, well nourished, very pleasant middle-aged Hispanic female, seated, in no evident distress  Neurologic Exam Mental Status: Awake and fully alert.  Limited English primarily Spanish-speaking. Denies dysarthria or aphasia.  Oriented to place and time. Recent and remote memory intact. Attention span, concentration and fund of knowledge appropriate. Mood and affect appropriate.  Cranial Nerves: Pupils equal, briskly reactive to light. Extraocular movements full without nystagmus. Visual fields right homonymous inferior quadrantanopia. Hearing intact. Face, tongue, palate moves normally and symmetrically.  Motor: Normal bulk and tone. Normal strength in all tested extremity muscles. Sensory.: intact to touch , pinprick , position and vibratory sensation Coordination: Rapid alternating movements normal in all extremities. Finger-to-nose and heel-to-shin performed accurately bilaterally.   Gait and Station: Arises from chair without difficulty. Stance is normal. Gait demonstrates normal stride length and balance without use of AD.  Reflexes: 1+ and symmetric. Toes downgoing.       ASSESSMENT: Erica Rosario is a 48 y.o. 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 On 04/04/2020 (d/c'd home from CIR on 10/15 in stable condition) with worsening headache, N/V and generalized weakness found to have worsening vasogenic edema in left parieto-occipital region and worsening midline shift of 13 mm requiring reversal of warfarin, mannitol  x1, Decadron  and hypertonic saline with improvement.  Hospital course complicated by severe anemia, leukocytosis and fever, and elevated liver enzymes. Vascular risk factors include anemia, HLD and OCP use.       PLAN:  Sagittal sinus thrombosis w/ hemorrhagic transformation:  Residual deficit: Right inferior homonymous quadrantanopia MRV 08/24/2020 no evidence of residual  thrombosis Continue aspirin  81 mg daily and atorvastatin  for secondary stroke prevention managed/prescribed by PCP  Discussed secondary stroke prevention measures and importance of close PCP follow up for aggressive stroke risk factor management including HLD with LDL goal<70 Repeat hypercoagulable labs: Slightly elevated anticardiolipin IgM (14) likely clinically insignificant  Right-sided numbness: Has been persistent since seizure-like activity on 03/02/2022 with some improvement and now more associated with increased exertion.  CTH and CTA head/neck 03/02/2022 unremarkable.  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 Intolerant to 1500mg  BID dosing Continue lamotrigine  100 mg twice daily -refill provided Prior intolerance to topiramate  on higher dosage (increased after 03/2023 seizure) Continue Valtoco  15mg  (based on weight) to use as needed for breakthrough seizure. Prior seizures: 02/2022 (in setting of missed Keppra  dosage), 08/2021 (tonic-clonic), 03/2021 (tonic-clonic), 03/2020 (onset, in setting of hemorrhagic transformation) EEG 09/2021 unremarkable Discussed avoiding seizure provoking triggers and ensuring medication compliance Advised to call with any recurrent seizure activity. Discussed indications to call 911 with seizure activity  Chronic frontal headaches Only mild infrequent headaches Continue lamotrigine  100 mg twice daily Previously managed with topiramate  (see above) Use of tylenol  as needed, advised to call if this should no longer be beneficial and can consider use of Nurtec or Ubrelvy  for rescue, triptans contraindicated due to stroke history    Follow-up in 9 months or  call earlier if needed    CC:  Comer Decamp, MD    I spent 25 minutes of face-to-face and non-face-to-face time with patient and husband assisted by [facility] health interpreter.  This included previsit chart review, lab review, study review, order entry, electronic health record documentation, patient education and discussion regarding above diagnoses and treatment plan and answered all the questions to patient and husband satisfaction  Johny Nap, AGNP-BC  [facility] [address] [place], [place] [address]  Phone [phone] Fax [phone] Note: This document was prepared with digital dictation and possible smart phrase technology. Any transcriptional errors that result from this process are unintentional.

## 2024-01-09 ENCOUNTER — Emergency Department

## 2024-01-09 ENCOUNTER — Other Ambulatory Visit: Payer: Self-pay

## 2024-01-09 ENCOUNTER — Emergency Department
Admission: EM | Admit: 2024-01-09 | Discharge: 2024-01-09 | Disposition: A | Discharge status: Home / Self Care | Attending: Emergency Medicine | Admitting: Emergency Medicine

## 2024-01-09 DIAGNOSIS — R202 Paresthesia of skin: Secondary | ICD-10-CM | POA: Insufficient documentation

## 2024-01-09 DIAGNOSIS — R569 Unspecified convulsions: Secondary | ICD-10-CM | POA: Diagnosis not present

## 2024-01-09 DIAGNOSIS — G40919 Epilepsy, unspecified, intractable, without status epilepticus: Secondary | ICD-10-CM

## 2024-01-09 DIAGNOSIS — R55 Syncope and collapse: Secondary | ICD-10-CM | POA: Insufficient documentation

## 2024-01-09 DIAGNOSIS — R531 Weakness: Secondary | ICD-10-CM | POA: Insufficient documentation

## 2024-01-09 LAB — COMPREHENSIVE METABOLIC PANEL WITH GFR
ALT: 32 U/L (ref 0–44)
AST: 30 U/L (ref 15–41)
Albumin: 3.9 g/dL (ref 3.5–5.0)
Alkaline Phosphatase: 83 U/L (ref 38–126)
Anion gap: 11 (ref 5–15)
BUN: 14 mg/dL (ref 6–20)
CO2: 21 mmol/L — ABNORMAL LOW (ref 22–32)
Calcium: 9 mg/dL (ref 8.9–10.3)
Chloride: 109 mmol/L (ref 98–111)
Creatinine, Ser: 0.68 mg/dL (ref 0.44–1.00)
GFR, Estimated: >60 mL/min (ref >60)
Glucose, Bld: 102 mg/dL — ABNORMAL HIGH (ref 70–99)
Potassium: 3.6 mmol/L (ref 3.5–5.1)
Sodium: 141 mmol/L (ref 135–145)
Total Bilirubin: 0.4 mg/dL (ref 0.0–1.2)
Total Protein: 6.7 g/dL (ref 6.5–8.1)

## 2024-01-09 LAB — CBC
HCT: 39.5 % (ref 36.0–46.0)
Hemoglobin: 13.3 g/dL (ref 12.0–15.0)
MCH: 32.2 pg (ref 26.0–34.0)
MCHC: 33.7 g/dL (ref 30.0–36.0)
MCV: 95.6 fL (ref 80.0–100.0)
Platelets: 193 K/uL (ref 150–400)
RBC: 4.13 MIL/uL (ref 3.87–5.11)
RDW: 11.9 % (ref 11.5–15.5)
WBC: 4.6 K/uL (ref 4.0–10.5)
nRBC: 0 % (ref 0.0–0.2)

## 2024-01-09 LAB — DIFFERENTIAL
Abs Immature Granulocytes: 0.01 K/uL (ref 0.00–0.07)
Basophils Absolute: 0 K/uL (ref 0.0–0.1)
Basophils Relative: 0 %
Eosinophils Absolute: 0.1 K/uL (ref 0.0–0.5)
Eosinophils Relative: 1 %
Immature Granulocytes: 0 %
Lymphocytes Relative: 36 %
Lymphs Abs: 1.7 K/uL (ref 0.7–4.0)
Monocytes Absolute: 0.5 K/uL (ref 0.1–1.0)
Monocytes Relative: 10 %
Neutro Abs: 2.4 K/uL (ref 1.7–7.7)
Neutrophils Relative %: 53 %

## 2024-01-09 LAB — PROTIME-INR
INR: 1 (ref 0.8–1.2)
Prothrombin Time: 13.4 s (ref 11.4–15.2)

## 2024-01-09 LAB — URINALYSIS, ROUTINE W REFLEX MICROSCOPIC
Bilirubin Urine: NEGATIVE
Glucose, UA: NEGATIVE mg/dL
Hgb urine dipstick: NEGATIVE
Ketones, ur: NEGATIVE mg/dL
Leukocytes,Ua: NEGATIVE
Nitrite: NEGATIVE
Protein, ur: NEGATIVE mg/dL
Specific Gravity, Urine: 1.006 (ref 1.005–1.030)
pH: 6 (ref 5.0–8.0)

## 2024-01-09 LAB — APTT: aPTT: 28 s (ref 24–36)

## 2024-01-09 LAB — TROPONIN I (HIGH SENSITIVITY)
Troponin I (High Sensitivity): <2 ng/L (ref <18)
Troponin I (High Sensitivity): 2 ng/L (ref <18)

## 2024-01-09 LAB — POC URINE PREG, ED: Preg Test, Ur: NEGATIVE

## 2024-01-09 LAB — CBG MONITORING, ED: Glucose-Capillary: 103 mg/dL — ABNORMAL HIGH (ref 70–99)

## 2024-01-09 LAB — ETHANOL: Alcohol, Ethyl (B): <15 mg/dL (ref <15)

## 2024-01-09 MED ORDER — SODIUM CHLORIDE 0.9% FLUSH: 3.0000 mL | Freq: Once | INTRAVENOUS | Status: DC

## 2024-01-09 MED ORDER — DIPHENHYDRAMINE HCL 50 MG/ML IJ SOLN
12.5000 mg | Freq: Once | INTRAMUSCULAR | Status: AC
  Administered 2024-01-09: 12.5 mg via INTRAVENOUS
  Filled 2024-01-09: qty 1

## 2024-01-09 MED ORDER — LAMOTRIGINE 50 MG PO TBDP: ORAL_TABLET | ORAL | 0 refills | Status: DC

## 2024-01-09 MED ORDER — METOCLOPRAMIDE HCL 5 MG/ML IJ SOLN
10.0000 mg | Freq: Once | INTRAMUSCULAR | Status: AC
  Administered 2024-01-09: 10 mg via INTRAVENOUS
  Filled 2024-01-09: qty 2

## 2024-01-09 NOTE — ED Provider Notes (Addendum)
 [facility] Provider Note    Event Date/Time   First MD Initiated Contact with Patient 01/09/24 1513     (approximate)   History   Code Stroke   HPI  Erica Rosario is a 48 y.o. female with history of stroke, seizures who comes in with concerns for weakness.  Patient reported having a near syncopal episode which is what she called EMS for.  Unclear if she hit her head.  She denied any chest pain, shortness of breath, falls onto the ground.  To me she only reported right arm and leg weakness with tingling.  Denies having this before with her seizures.  She did take some of her diazepam  for her seizures before EMS got there so the question was if patient could have had a seizure first.  Later patient did report to the nurses and to Dr. Terald from neurology that she symptoms gets numbness when she has seizures.  Reports the numbness is resolving  Physical Exam   Triage Vital Signs: ED Triage Vitals [01/09/24 1453]  Encounter Vitals Group     BP 121/63     Girls Systolic BP Percentile      Girls Diastolic BP Percentile      Boys Systolic BP Percentile      Boys Diastolic BP Percentile      Pulse Rate (!) 55     Resp 18     Temp 98.3 F (36.8 C)     Temp Source Oral     SpO2 98 %     Weight      Height      Head Circumference      Peak Flow      Pain Score      Pain Loc      Pain Education      Exclude from Growth Chart     Most recent vital signs: Vitals:   01/09/24 1453  BP: 121/63  Pulse: (!) 55  Resp: 18  Temp: 98.3 F (36.8 C)  SpO2: 98%     General: Awake, no distress.  CV:  Good peripheral perfusion.  Resp:  Normal effort.  Abd:  No distention.  Other:  Decree sensation on the right   ED Results / Procedures / Treatments   Labs (all labs ordered are listed, but only abnormal results are displayed) Labs Reviewed  COMPREHENSIVE METABOLIC PANEL WITH GFR - Abnormal; Notable for the following components:      Result Value    CO2 21 (*)    Glucose, Bld 102 (*)    All other components within normal limits  CBG MONITORING, ED - Abnormal; Notable for the following components:   Glucose-Capillary 103 (*)    All other components within normal limits  PROTIME-INR  APTT  CBC  DIFFERENTIAL  ETHANOL  LAMOTRIGINE  LEVEL  LEVETIRACETAM  LEVEL  TOPIRAMATE  LEVEL  URINALYSIS, ROUTINE W REFLEX MICROSCOPIC  CBG MONITORING, ED  POC URINE PREG, ED  TROPONIN I (HIGH SENSITIVITY)     EKG  My interpretation of EKG:  Normal sinus bradycardia rate of 58 without any ST elevation or T wave inversions, normal intervals  RADIOLOGY I have reviewed the CT head personally interpreted no evidence of intracranial hemorrhage   PROCEDURES:  Critical Care performed: Yes, see critical care procedure note(s)  .1-3 Lead EKG Interpretation  Performed by: Ernest Ronal BRAVO, MD Authorized by: Ernest Ronal BRAVO, MD     Interpretation: abnormal     ECG rate:  68   ECG rate assessment: bradycardic     Rhythm: sinus bradycardia     Ectopy: none     Conduction: normal   .Critical Care  Performed by: Ernest Ronal BRAVO, MD Authorized by: Ernest Ronal BRAVO, MD   Critical care provider statement:    Critical care time (minutes):  30   Critical care was necessary to treat or prevent imminent or life-threatening deterioration of the following conditions: code stroke.   Critical care was time spent personally by me on the following activities:  Development of treatment plan with patient or surrogate, discussions with consultants, evaluation of patient's response to treatment, examination of patient, ordering and review of laboratory studies, ordering and review of radiographic studies, ordering and performing treatments and interventions, pulse oximetry, re-evaluation of patient's condition and review of old charts    MEDICATIONS ORDERED IN ED: Medications - No data to display   IMPRESSION / MDM / ASSESSMENT AND PLAN / ED COURSE  I reviewed the  triage vital signs and the nursing notes.   Patient's presentation is most consistent with acute presentation with potential threat to life or bodily function.   Differential: Stroke, seizure, electrolyte abn, syncope.   INR is normal CBC reassuring.  CMP reassuring ethanol negative glucose normal  Stroke code was called from triage.  TNK not given symptoms improving.   3:29 PM patient now stating that she has a headache.  I will give a migraine cocktail.  She did report thinking that she might be having a seizure which is why she took her PRN (diazapam) medication.   Patient will be handed off to oncoming team pending neurology evaluation.   The patient is on the cardiac monitor to evaluate for evidence of arrhythmia and/or significant heart rate changes.      FINAL CLINICAL IMPRESSION(S) / ED DIAGNOSES   Final diagnoses:  Tingling     Rx / DC Orders   ED Discharge Orders     None        Note:  This document was prepared using Dragon voice recognition software and may include unintentional dictation errors.   Ernest Ronal BRAVO, MD 01/09/24 1531    Ernest Ronal BRAVO, MD 01/09/24 [phone]

## 2024-01-09 NOTE — ED Provider Notes (Signed)
-----------------------------------------   3:13 PM on 01/09/2024 -----------------------------------------  Blood pressure 121/63, pulse (!) 55, temperature 98.3 F (36.8 C), temperature source Oral, resp. rate 18, SpO2 98%.  Assuming care from Dr. Ernest.  In short, Erica Rosario is a 48 y.o. female with a chief complaint of Code Stroke .  Refer to the original H&P for additional details.  The current plan of care is to follow-up neuro recs for seizure vs stroke.  ----------------------------------------- 6:00 PM on 01/09/2024 ----------------------------------------- Patient reevaluated by neurology, who suspects breakthrough seizure and no findings to suggest stroke at this time.  Patient has been bradycardic at times, but remains in sinus bradycardia and 2 sets of troponin are unremarkable.  She is not on any AV nodal blocking agents and I doubt bradycardia contributed to her presentation today.  We will increase lamotrigine  dose to 100 mg in the morning and 150 mg in the evening at the recommendation of neurology.  Patient appropriate for discharge home with outpatient neurology follow-up, was counseled to return to the ED for new or worsening symptoms.  Patient agrees with plan.       Willo Dunnings, MD 01/09/24 [phone]

## 2024-01-09 NOTE — ED Notes (Signed)
 1330 Last known well 1424 Patient arrived to ED 1427 Code stroke called after seeing MD Ernest and patient in CT room 3 with 2 RN's 1428 Telestroke notified 1432 Dr. Jerrie in [place] 1433 Pharmacy in [place] 1445 Patient to ED room 1 from CT

## 2024-01-09 NOTE — Progress Notes (Signed)
 CODE STROKE- PHARMACY COMMUNICATION   Time CODE STROKE called/page received:1430  Time response to CODE STROKE was made (in person or via phone): In person  Time Stroke Kit retrieved from Pyxis (only if needed): TNK not given  Name of Provider/Nurse contacted: Dr. Jerrie  Past Medical History:  Diagnosis Date   Acute focal neurological deficit 04/02/2021   Anemia    Hemorrhagic cerebrovascular accident (CVA) (HCC) 03/25/2020   Seizures (HCC)    Stroke (HCC)    Thrombosis of superior sagittal sinus 03/25/2020   a.) thrombosis of the posterior aspect of the superior sagittal sinus with hemorrhagic transformation of a venous infarct in the LEFT occipital lobe; (+) vasogenic edema with regional mass effect resulting in LEFT to RIGHT midline shift   Prior to Admission medications   Medication Sig Start Date End Date Taking? Authorizing Provider  acetaminophen  (TYLENOL ) 500 MG tablet Take 500 mg by mouth as needed.    [provider]  aspirin  EC 81 MG tablet Take 1 tablet (81 mg total) by mouth daily. Swallow whole. 02/03/22   Whitfield Raisin, NP  atorvastatin  (LIPITOR) 40 MG tablet Take 40 mg by mouth daily.    [provider]  diazePAM , 15 MG Dose, (VALTOCO  15 MG DOSE) 2 x 7.5 MG/0.1ML LQPK Place 15 mg into the nose as needed. 03/27/23   Whitfield Raisin, NP  hydrocortisone 2.5 % cream Apply 1 Application topically 2 (two) times daily. 03/01/23   [provider]  lamoTRIgine  (LAMICTAL ) 100 MG tablet Take 1 tablet (100 mg total) by mouth 2 (two) times daily. 10/16/23   Whitfield Raisin, NP  levETIRAcetam  (KEPPRA ) 500 MG tablet Take 2 tablets (1,000 mg total) by mouth every morning AND 3 tablets (1,500 mg total) at bedtime. 10/16/23   Whitfield Raisin, NP    Damien Napoleon ,PharmD Clinical Pharmacist  01/09/2024  2:44 PM

## 2024-01-09 NOTE — ED Triage Notes (Signed)
 Patient to ED via [facility] with complaints of sudden onset of right arm and right leg weakness that started today at 1330.   CBG 100 20G LAC

## 2024-01-09 NOTE — ED Notes (Addendum)
 Patient reports numbness is resolving. Patient states she develops numbness like this when she has seizures.

## 2024-01-09 NOTE — ED Notes (Signed)
 Called CCMD to place patient on monitor

## 2024-01-09 NOTE — Consult Note (Addendum)
 NEUROLOGY CONSULT NOTE   Date of service: January 09, 2024 Patient Name: Erica Rosario MRN:  [id number] DOB:  [birth year] Chief Complaint: Breakthrough seizures Requesting Provider: Willo Dunnings, MD  History of Present Illness  Erica Rosario is a 48 y.o. 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  Code stroke was activated for concern for right sided weakness but she subsequently clarified that she was presenting for her typical seizure  Regarding her CVST, no clear etiology was found, and she was eventually narrowed to aspirin  monotherapy  Regarding her seizures, she is currently managed on Keppra  at 1000 mg in the morning/1500 mg in the evening (intolerant to 1500 mg twice daily due to side effects) Lamotrigine  100 mg twice daily Valtoco  15 mg for breakthrough seizure (used today with improvement in her symptoms)  She denies any recent headache, any signs/symptoms of infection, reports mood has been down the past few days but no missed medications (shows pill box with appropriate doses taken and I personally confirmed correct pills), no change in sleep habits   Has developed a headache since this event which is not atypical for her post-seizure.     ROS  Comprehensive ROS performed and pertinent positives documented in HPI   Past History   Past Medical History:  Diagnosis Date   Acute focal neurological deficit 04/02/2021   Anemia    Hemorrhagic cerebrovascular accident (CVA) (HCC) 03/25/2020   Seizures (HCC)    Stroke (HCC)    Thrombosis of superior sagittal sinus 03/25/2020   a.) thrombosis of the posterior aspect of the superior sagittal sinus with hemorrhagic transformation of a venous infarct in the LEFT occipital lobe; (+) vasogenic edema with regional mass effect resulting in LEFT to RIGHT midline shift    Past Surgical History:  Procedure  Laterality Date   VAGINAL HYSTERECTOMY N/A 01/20/2022   Procedure: HYSTERECTOMY VAGINAL WITH BILATERAL SALPINGECTOMY;  Surgeon: Lovetta Debby PARAS, MD;  Location: [facility] ORS;  Service: Gynecology;  Laterality: N/A;    Family History: Family History  Problem Relation Age of Onset   Breast cancer Neg Hx     Social History  reports that she has never smoked. She has never used smokeless tobacco. She reports that she does not currently use alcohol. She reports that she does not use drugs.  No Known Allergies  Medications   Current Facility-Administered Medications:    metoCLOPramide  (REGLAN ) injection 10 mg, 10 mg, Intravenous, Once, Erica Ronal BRAVO, MD  Current Outpatient Medications:    acetaminophen  (TYLENOL ) 500 MG tablet, Take 500 mg by mouth as needed., Disp: , Rfl:    aspirin  EC 81 MG tablet, Take 1 tablet (81 mg total) by mouth daily. Swallow whole., Disp: 30 tablet, Rfl: 0   atorvastatin  (LIPITOR) 40 MG tablet, Take 40 mg by mouth daily., Disp: , Rfl:    diazePAM , 15 MG Dose, (VALTOCO  15 MG DOSE) 2 x 7.5 MG/0.1ML LQPK, Place 15 mg into the nose as needed., Disp: 1 each, Rfl: 0   hydrocortisone 2.5 % cream, Apply 1 Application topically 2 (two) times daily., Disp: , Rfl:    lamoTRIgine  (LAMICTAL ) 100 MG tablet, Take 1 tablet (100 mg total) by mouth 2 (two) times daily., Disp: 180 tablet, Rfl: 3   levETIRAcetam  (KEPPRA ) 500 MG tablet, Take 2 tablets (1,000 mg total) by mouth every morning AND 3 tablets (1,500 mg total) at bedtime.,  Disp: 450 tablet, Rfl: 3  Vitals   Vitals:   2024/01/13 1453  BP: 121/63  Pulse: (!) 55  Resp: 18  Temp: 98.3 F (36.8 C)  TempSrc: Oral  SpO2: 98%    There is no height or weight on file to calculate BMI.   Physical Exam   Constitutional: Appears well-developed and well-nourished.  Psych: Affect appropriate to situation, calm and cooperative Eyes: No scleral injection HENT: No oropharyngeal obstruction.  MSK: no joint deformities.   Cardiovascular: Normal rate and regular rhythm. Perfusing extremities well Respiratory: Effort normal, non-labored breathing GI: Soft.  No distension. There is no tenderness.  Skin: Warm dry and intact visible skin  Neurologic Examination   Mental Status: Patient is awake, alert, oriented to person, place, month, year, and situation. Patient is able to give a clear and coherent history. No signs of aphasia or neglect Cranial Nerves: II: Visual Fields are full. Pupils are equal, round, and reactive to light.   III,IV, VI: EOMI without ptosis or diploplia.  V: Facial sensation is symmetric to temperature VII: Facial movement is symmetric.  VIII: hearing is intact to voice X: Uvula elevates symmetrically XI: Shoulder shrug is symmetric. XII: tongue is midline without atrophy or fasciculations.  Motor: Tone is normal. Bulk is normal. 5/5 throughout Sensory: Mild sensation loss in just the arm, which improved over course of ED stay and resolved Cerebellar: FNF and HKS are intact bilaterally Gait:  Deferred    Labs/Imaging/Neurodiagnostic studies   CBC:  Recent Labs  Lab 13-Jan-2024 1427  WBC 4.6  NEUTROABS 2.4  HGB 13.3  HCT 39.5  MCV 95.6  PLT 193   Basic Metabolic Panel:  Lab Results  Component Value Date   NA 141 January 13, 2024   K 3.6 January 13, 2024   CO2 21 (L) Jan 13, 2024   GLUCOSE 102 (H) 01-13-2024   BUN 14 13-Jan-2024   CREATININE 0.68 January 13, 2024   CALCIUM  9.0 13-Jan-2024   GFRNONAA >60 13-Jan-2024   Lipid Panel:  Lab Results  Component Value Date   LDLCALC 65 03/22/2023   HgbA1c:  Lab Results  Component Value Date   HGBA1C 5.6 03/21/2023   Urine Drug Screen:     Component Value Date/Time   LABOPIA NONE DETECTED 03/02/2022 1641   LABOPIA NONE DETECTED 03/26/2020 1647   COCAINSCRNUR NONE DETECTED 03/02/2022 1641   LABBENZ NONE DETECTED 03/02/2022 1641   LABBENZ POSITIVE (A) 03/26/2020 1647   AMPHETMU NONE DETECTED 03/02/2022 1641   AMPHETMU NONE  DETECTED 03/26/2020 1647   THCU NONE DETECTED 03/02/2022 1641   THCU NONE DETECTED 03/26/2020 1647   LABBARB NONE DETECTED 03/02/2022 1641   LABBARB NONE DETECTED 03/26/2020 1647    Alcohol Level     Component Value Date/Time   ETH <15 01-13-24 1427   INR  Lab Results  Component Value Date   INR 1.0 2024/01/13   APTT  Lab Results  Component Value Date   APTT 28 2024/01/13   AED levels: No results found for: PHENYTOIN, ZONISAMIDE, LAMOTRIGINE , LEVETIRACETA  CT Head without contrast(Personally reviewed): no acute intracranial process     ASSESSMENT   Erica Rosario is a 48 y.o. 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 Therefore will increase medications. No need for imaging as no new symptoms including no recent headache prior to event today.   RECOMMENDATIONS  - Lamotrigine  increase to 100 mg in the morning and 150 mg at night - Keppra  continue  1000 mg morning / 1500 mg at night - Continue Valtoco  rescue med, please provide refill if needed - No further inpatient neurological workup, neurologically stable for discharge - Discussed with Dr. Ernest and Dr. Willo in person  Standard seizure precautions: [place] statutes, patients with seizures are not allowed to drive until  they have been seizure-free for six months. Use caution when using heavy equipment or power tools. Avoid working on ladders or at heights. Take showers instead of baths. Ensure the water temperature is not too high on the home water heater. Do not go swimming alone. When caring for infants or small children, sit down when holding, feeding, or changing them to minimize risk of injury to the child in the event you have a seizure.  To reduce risk of seizures, maintain good sleep hygiene avoid alcohol and illicit drug use, take all anti-seizure medications as prescribed.    ______________________________________________________________________  Erica Jernigan MD-PhD [facility] [phone] [facility] coverage for [facility] is from 8 AM to 4 AM in-house and 4 PM to 8 PM by telephone/video. 8 PM to 8 AM emergent questions or overnight urgent questions should be addressed to [facility] On-call or [facility] neurohospitalist; contact information can be found on AMION  CRITICAL CARE Performed by: Erica Rosario   Total critical care time: 35 minutes  Critical care time was exclusive of separately billable procedures and treating other patients.  Critical care was necessary to treat or prevent imminent or life-threatening deterioration -- emergent evaluation for consideration of thromolytic / thrombectomy, evaluation for possible focal status epilepticus.   Critical care was time spent personally by me on the following activities: development of treatment plan with patient and/or surrogate as well as nursing, discussions with consultants, evaluation of patient's response to treatment, examination of patient, obtaining history from patient or surrogate, ordering and performing treatments and interventions, ordering and review of laboratory studies, ordering and review of radiographic studies, pulse oximetry and re-evaluation of patient's condition.

## 2024-01-09 NOTE — ED Notes (Signed)
Code  stroke  called  to  [facility] 

## 2024-01-11 ENCOUNTER — Telehealth: Payer: Self-pay | Admitting: Adult Health

## 2024-01-11 NOTE — Telephone Encounter (Signed)
 Pt has called with her son(Jason not on DPR) thru her son pt had it relayaed that she had a seizure 2 days agao(was reeased from hospital same day) she said she was told to take 50 additional mg of the  lamoTRIgine  50 MG TBDP,  Pt relayed that she has been doing that, she would like to discuss the recent seizure with RN, please call pt.

## 2024-01-12 NOTE — Telephone Encounter (Signed)
 Cld Pt w/interpreter service - Interpreter Beverley [phone]. Pt is asking if NP is ok with her being on increased dose of lamotrigine  the MD in hospital has her taking. Pt went to ED on 7/22 for weakness but neurology at the hospital suspected Pt had a breakthrough seizure. Pt lamotrigine  50 mg increased to 2 tabs in AM and 3 tabs in PM. Pt would like an earlier appt with NP to discuss if she needs to continue this dose of lamotrigine . Pt has appt 03/07/24. Was able to offer her an appt on 02/21/24 which she accepted. Pt also asked how to set up [facility] acct. Gave Pt ph number for [facility] help desk. Pt voiced understanding and thanked Charity fundraiser for call back.

## 2024-02-20 NOTE — Progress Notes (Unsigned)
 [facility] [address] [place]. [name] [address] [phone]       OFFICE FOLLOW UP NOTE  [name] Date of Birth:  [birth year] Medical Record Number:  [id number]   Reason for visit: Seizure and stroke follow-up    SUBJECTIVE:   CHIEF COMPLAINT:  No chief complaint on file.     HPI:   Update 02/21/2024 JM: Patient returns for sooner scheduled visit due to recent possible seizure.  She was seen in the ED 7/22 after near syncopal episode and patient reporting right arm and leg weakness with tingling with gradual improvement of symptoms after self administering Valtoco . She reported typical presentation with seizures.  CTH negative for acute abnormalities.  She was seen by a neurologist Dr. Jerrie, no clear trigger for seizure therefore recommended increasing lamotrigine  from 100mg  BID to 100mg  AM and 150mg  PM and continuation of Keppra  1000/1500.          History provided for reference purposes only Update 10/16/2023 JM: Patient returns for follow-up visit accompanied by her spouse, assisted by [facility] interpreter.  Denies any additional seizure activity.  Reports compliance on lamotrigine  100 mg twice daily and Keppra  1000/1500, tolerating without side effects.  Only experiencing occasional headaches, not severe, lasts only short duration.  No new stroke/TIA symptoms.  Reports compliance on aspirin  and atorvastatin .  Reports routine follow-up with PCP.  No questions or concerns at this time.  Update 03/23/2023 JM: Patient returns for sooner follow-up visit due to recent ED visit.  She is accompanied by her niece and [place] interpreter.  She presented to [facility] ED on 10/1 with right sided numbness and weakness, similar presentation 02/2022 and presumed secondary to seizure.  MRI brain negative for acute finding.  Evaluated by neurology Dr. 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  Recommended increased  topiramate  dosage from 25mg  BID to 50 mg BID and continuation of levetiracetam  1000/1500.   Patient called 10/3 reporting issues with increased topiramate  dosage, reported nausea with vomiting, head pressure, lightheadedness and dizziness sensation. She felt due to higher topiramate  dosage as symptoms presented after increasing dose.  Patient self decreased topiramate  dosage back to 25 mg BID and noted improvement of symptoms along with Zofran  for nausea (rx'd by PCP). In setting of recent seizure, she was advised to increase nighttime dosage to 50mg  and continue 25mg  AM.   Denies any additional seizure activity. Is currently on topamax  25mg  AM and 50mg  PM, reports tolerating well over the past several days but last night started to have return of floating/lightheadedness sensation. Does admit to limted water intake, about 16-32oz per day.  Also notes initially feeling intense pressure in her head but this has been gradually improving over the past few days. Remains on keppra  1000/1500.   Update 03/08/2023 JM: Patient returns for follow-up visit accompanied by daughter and [place] interpreter.  No recent seizure activity on Keppra  1000/1500, no side effects.  Remains on topiramate  25 mg twice daily for headache prophylaxis, does have occasional headaches about once every 1-3 months.   Right sided numbness present with increased activity still. 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  Stable right peripheral visual impairment.  Compliant on aspirin  and atorvastatin .  Routinely follows with PCP for stroke risk factor management.   Update 08/31/2022 JM: Patient returns for 50-month follow-up.  She is accompanied by [facility] health  interpreter.    Denies any recurrent seizure activity, remains on Keppra  1000/1500, denies side effects.  She occasionally continues to miss  dosages but has been working on trying to be more diligent about this.  She reports continued right-sided numbness especially with increased exertion and will improve after resting.  At times her right hand will become numb and start to shake, can last all day. No always associated with full side right-sided numbness and denies any association with headaches. Usually in setting of increased stressors. Also mentions low back pain but denies radiating symptoms, also notes pain in both hands. Plans on scheduling f/u visit with PCP to further discuss.   Headaches have improved since prior visit, having about 1 headache every 2 months. Continues on topamax  25mg  twice daily, tolerating well. Can have headaches if thinking too much or trying to concentrate. Will use tylenol  with benefit.   Denies any new stroke/TIA symptoms.  Continued right peripheral impairment, stable since prior visit. Compliant on aspirin  and atorvastatin .  Blood pressure well-controlled.  Routinely follows with PCP.  Update 03/07/2022 JM: Patient returns for acute visit due to recent possible seizure.  She is accompanied by [facility] health interpreter.  She presented to [facility] ED on 9/13 complaining of episode of distress, describing it as a rising sensation in her stomach followed by a feeling of despair as well as weakness and numbness in both arms and legs but difficulty fully describing, while in ED multiple episodes where she became quite distressed and she would hold her right leg flexed, could be easily repositioned and able to move it voluntarily without increased tone during episode.  Evaluated by neurologist Dr. Michaela with symptoms consistent with epigastric rising/sense of impending doom concerning a partial seizure however multiple findings on exam which could be suspicious for nonorganic etiology of these events.  CT head and CTA head/neck unremarkable.  She did receive 2 mg of IV Ativan  and Keppra  load and remained episode free  for multiple hours.  Did miss a dose of her medication that morning and due to difficulty tolerating higher dosage, recommended continuation of current Keppra  dosage.  She does report continued right sided numbness which has been persistent since she was in the hospital (describes complete numbness side of face, arm and leg). Has improved some since discharge. She has not had any additional seizure like activity since discharge. She does confirm she did miss her morning dose of keppra  prior to her seizure.  She has not missed any dosages since recent ED visit.  She does continue to experience headaches which was discussed at prior visit, did obtain lab work which did not show any signs of anemia (headaches previously present while anemic). She is interested in starting prophylactic therapy.  Previously taking topiramate  with benefit.  Remains on aspirin  and atorvastatin .  Closely followed by PCP.  No further concerns at this time.   Update 03/01/2022 JM: Patient returns for 17-month stroke and seizure follow-up accompanied by [facility] health interpreter.  Overall stable without any reoccurring seizure activity or new stroke/TIA symptoms.  She does complain of recurrence of typical frontal headaches which have worsened over the past 4 months.  Previously associated with iron  deficiency anemia.  Does report having issues with heavy menstrual cycles over the past few months and ended up undergoing hysterectomy on 8/3.  She did receive iron  infusion back in July, has not had repeat lab work since that time.  She is scheduled to follow-up with OB next week.  Compliant  on Keppra  1000/1500, denies side effects.  Compliant on aspirin  and atorvastatin , denies side effects.  Blood pressure today 110/60.  No further concerns at this time  Update 09/14/2021 JM: Patient returns for acute visit due to recent seizure activity.  She is accompanied by her sister and [facility] interpreter.  She presented to [facility] ED on  09/10/2021 for a witnessed generalized tonic-clonic seizure lasting approximately 5 minutes.  She was confused and postictal upon arrival.  She had additional tonic-clonic seizure in ED lasting approximately 1 to 2 minutes, received IV Ativan  and Keppra  load.  Lab work largely within normal limits and CTH no acute abnormality.  Due to likely unprovoked seizure, Keppra  dosage increased to 1500 mg twice daily.  Since discharge, she has been doing well without any reoccurring seizure activity.  Remains on Keppra  1500 mg twice daily but does complain on dizziness and nauseous since starting which was a prior compliant on higher keppra  dosage. Prior to seizure onset, denies any missed dosages, significantly elevated stress levels, recent medication changes or any potential seizure provoking triggers.   Update 07/26/2021 JM: Returns for follow-up visit after prior visit 4 months ago with Dr. Rosemarie for recent breakthrough seizure and hx of stroke. She is accompanied by a friend and [facility] interpreter.  Overall stable since prior visit without any additional seizure activity, compliant on Keppra  500/1000mg  without side effects (prior difficulty tolerating 1000mg  BID dosing which improved after dose adjustment). EEG 04/2021 unremarkable without evidence of seizures.  Stable from stroke standpoint without new stroke/TIA symptoms and stable residual deficit of right peripheral visual impairment.  Compliant on aspirin  and atorvastatin  without side effects.  Blood pressure today 106/72. At prior visit, topamax  discontinued as no headaches but shortly after stopping, expericing reoccurring headaches - PCP restarted Topamax , headaches stable but can occur with increased stress. Recently started on venlafaxine for premenopausal symptoms.  No new concerns at this time.   Update 04/07/2021 Dr. Rosemarie Patient returns for follow-up after last visit 2 months ago.  She is accompanied by brother and Spanish language interpreter was  present throughout this visit.  Patient was seen in the ER on 04/03/2021 with breakthrough seizures.  She presented with visual problems which was similar to what she had during her previous episode of venous sinus thrombosis and subsequently went on to have multiple seizures.  Patient denied missing any recent doses of medicines.  She was loaded with 2 g of Keppra  and Ativan  2 mg.  Her maintenance dose of Keppra  was increased to 1 g twice daily.  Patient states she does not remember the episode of the hospital visit.  She states she is done well since then and has not had any further recurrent seizures but she complains of feeling dizzy and having nausea and trouble tolerating the higher dose.  She did also have CT scan of the head as well as CT angiogram of the neck and brain both of which were unremarkable on 04/03/2021.  Patient denies significant headaches or numbness but she is on Topamax  25 mg twice daily and is wondering if she can stop it.  She has no new complaints today.  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  Update 01/18/2021 JM: Ms. Jimerson returns for 45-month stroke follow-up accompanied by sister and [facility] interpreter.  She was evaluated at [facility] ED 7/29 for dizziness and  occipital and frontal headache associated with bright lights in right eye over the past week.  Seen in ED for similar symptoms on 3/7 and diagnosed with iron  deficiency anemia possibly in setting of heavy menstrual cycle.  Started menstrual cycle on 7/23 but symptoms much worse compared to prior symptoms. Dizziness improved after meclizine  and received IM Reglan  for headache.  CT head unremarkable for acute findings.  Lab work unremarkable.  Symptoms have since resolved.  Is scheduled for IUD implant on 8/8 due to continued heavy irregular menstrual cycles.  Of note, she did have IUD placed 7/12 to help control menstrual  cycles with heavy bleeding.  She describes the dizziness as a lightheaded feeling.  She has had vertigo in the past and reports different dizziness symptoms associated with menstrual cycle.  Residual stroke deficit right peripheral visual impairment stable.  Denies any other new stroke/TIA symptoms. Compliant on warfarin with reported heavy menstrual cycle since starting and atorvastatin  without associated side effects. Blood pressure today 110/66. Reports complaince on keppra  500mg  BID without associated side effects and denies any seizure activity.  No further concerns at this time.  Update 09/09/2020 JM: Mrs. Stopper returns for stroke follow-up after prior visit approximately 4 months ago accompanied by interpreter.   Residual deficits of mild right peripheral visual impairment but is improving.  She denies residual right hand numbness Evaluated in the ED 08/24/2020 with headache, dizziness and visual complaints. MRI and MRV unremarkable for acute findings.  Noted to be anemic with low iron  levels which was felt possibly related to heavy menstrual cycles. Headache and dizziness have since resolved. She has since f/u with OB/GYN with plans on placing IUD to help control menstrual cycles.  Also follow-up with PCP who initiated ferrous sulfate . Has f/u next week for repeat lab work Denies new stroke/TIA symptoms  reports bilateral hand finger joint pain with increased stiffness and quick lasting tremor with extension of the fingers upon awakening which has been present over the past month.  Denies any weakness, pain in other locations such as wrist or arm or numbness/tingling  Continues on Keppra  500 mg twice daily -tolerating denies any recent seizure activity  Remains on warfarin with INR levels monitored by PCP- per patient, INR level stable - unable to view via epic - aside from heavy menstrual cycles, no other bleeding or bruising concerns Remains on atorvastatin  -denies associated side  effects  Blood pressure today 110/67 - does not routinely monitor at home  No further concerns at this time  Initial visit 05/12/2020 JM: Ms. Bernath is being seen for hospital follow-up accompanied by her brother and interpreter. She reports she has been improving but continues to have right hand numbness, right peripheral visual impairment and gait impairment due to visual difficulty. She has not done any therapies since discharge due to lack of insurance.  She is in the process of applying for [employer] financial assistance as well as other insurance..  Denies new or worsening stroke/TIA symptoms. 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  She lives with her husband and 2 sons able to maintain ADLs independently and has been slowly returning to completing IADLs.  She continues on Keppra  500 mg twice daily tolerating well and denies any reoccurring seizure activity.  She remains on warfarin without bleeding or bruising with INR levels monitored by PCP weekly. Per patient, INR levels have been greater than 3 so currently doing dosage adjustments per PCP. Remains  on atorvastatin  without myalgias. Blood pressure today 114/68 - not routinely monitored at home.  She has since discontinued OCP.  No further concerns at this time.  Stroke admission 03/25/2020 JM: Ms. JAYONNA MEYERING is a 48 y.o. female with no PMH  who presented to [facility] with HA and neck stiffness on 03/25/2020.   Personally reviewed hospitalization pertinent progress notes, lab work and imaging with summary provided.  Shortly transferred to [facility] and evaluated by Dr. 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  Repeat CT head showed hemorrhagic transformation L parietal with slight increase edema with most hemorrhagic foci unchanged and single new  hemorrhage at vertex.  Repeat CT head 3 hours later stable. Pan CT negative for malignancy but did show B renal vein nonocclusive thrombosis. CTV and CTA h/n negative LVO with partial thrombosis of the superior sagittal sinus at the vertex, nonocclusive as well can be demonstrated around the margins of the thrombus with patency beyond that.  Also showed thrombosed superficial draining vein of the left again visible, similar in appearance with low density edema slightly increased and mild swelling with mass-effect and 1 to 2 mm of left-to-right shift and flattening of the left lateral ventricle.  Questionable hypercoagulable state with hypercoagulable showed anticardiolipin IgM 19 (indeterminate) and protein S total and active mildly low but likely inaccurate on heparin .  Advise to discontinue OCP use.  Placed on heparin  IV and warfarin daily. GTC in ER s/p Ativan  and Keppra  load and initiated Keppra  500 mg twice daily with LTM showing left frontal temporal continuous slowing and bilateral parieto-occipital spikes.  LDL UTC with direct LDL of 151 no statin PTA and initiated atorvastatin  40 mg daily.  No prior history of HTN or DM.  Other stroke risk factors include history of EtOH use and overweight but no prior stroke history.  Other active problems include anemia, leukocytosis and hypokalemia.  Evaluated by therapies and recommended discharge to CIR for ongoing therapy needs.  She was discharged to CIR on 03/31/2020 and discharged home on 04/03/2020 on Keppra , Topamax  and warfarin.    She unfortunately returned on 04/04/2020 with complaint of headache, generalized weakness and faintness as well as nauseated and several episodes of emesis but no focal deficits noted.  She was found to have worsening vasogenic edema in left parieto-occipital region and worsening midline shift of 13 mm.  Warfarin discontinued and reversed with vitamin K.  Administering mannitolx1, Decadron , hypertonic saline and heparin  drip.   Repeat CT head showed improved MLS, cerebral edema and decreased hydrocephalus and again repeat CT head showed stable edema with decreasing IPH and same 1 cm midline shift. NSG on board PRN.  Also found to have severe anemia receiving 2U PRBC with improvement potentially contributing to syncopal event on 10/15 at home.  Elevated liver enzymes of unclear etiology possibly related to Topamax  therefore DC'd.  Restarted warfarin with INR goal 2-3 in addition to Lovenox  for total of 4 doses.  Advised to follow-up with [facility] on 10/25 for pro time blood test and CBC as well as INR levels and adjustment of warfarin as indicated and possible discontinuation of Lovenox .  Discharged on dexamethasone .  Reevaluated by therapy recommended outpatient OT and no PT needs.  She was discharged home on 04/11/2020.   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 Also thrombosis draining cortical vein. 2 areas enhancement in L occipital hemorrhage possible ongoing hemorrhage. Similar edema w/ slight R shift. CTA and CTV 10/9 - Partial thrombosis of the superior sagittal sinus at the vertex, Thrombosed superficial draining vein on the left, Hemorrhagic infarction in the left parietal cortical and subcortical brain  Pan CT neg for malignancy, B renal vein nonocclusive thrombosis. CTV 10/16 Decreased volume of nonocclusive superior sagittal sinus Thrombus. Decreased conspicuity of thrombus within an adjacent left cortical vein. Repeat CT head improved MLS, cerebral edema and decreased hydrocephalus  2D Echo EF 60-65%. No source of embolus  LE dopplers 10/19 - no DVT  LDL UTC, TC 210, TG 132, direct LDL 151.9 HgbA1c  5.6 UDS +benzos Hypercoagulable labs unremarkable (anticardiolipin IgM slightly elevated, prot S total and activity mildly low - not accurate on heparin ) VTE prophylaxis - heparin  IV   No antithrombotic prior to admission, now on full dose lovenox . On coumadin  with INR goal 2-3.  Today INR 1.5 Decadron  4mg  IV q6h ->2mg  IV q 6h -> 2mg  po Q6h -> 2mg  Q8  Therapy recommendations:  Outpt OT, no PT - gave pt info for pro bono therapies at [facility] clinic Disposition:  home  Cerebral Edema CT head Left parieto-occipital venous infarct with increased edema and mass effect including 13 mm of rightward midline shift. New mild dilatation of the right lateral ventricle suggesting early trapping Repeat CT improved MLS, cerebral edema and decreased hydrocephalus CT repeat stable edema, decreasing IPH. Same 1cm midline shift  NSG on board PRN S/p mannitol  x 1 Decadron  10mg  x 1 -> 4mg  Q6 -> 2mg  Q6 ->2mg  Q8 -> tapering On 3% at 75 -> 40 cc / hr -> 20cc->off Increase topomax to 50mg  bid -> d/c PICC 10/18->10/22 removed      ROS:   14 system review of systems performed and negative with exception of those listed in HPI  PMH:  Past Medical History:  Diagnosis Date   Acute focal neurological deficit 04/02/2021   Anemia    Hemorrhagic cerebrovascular accident (CVA) (HCC) 03/25/2020   Seizures (HCC)    Stroke (HCC)    Thrombosis of superior sagittal sinus 03/25/2020   a.) thrombosis of the posterior aspect of the superior sagittal sinus with hemorrhagic transformation of a venous infarct in the LEFT occipital lobe; (+) vasogenic edema with regional mass effect resulting in LEFT to RIGHT midline shift    PSH:  Past Surgical History:  Procedure Laterality Date   VAGINAL HYSTERECTOMY N/A 01/20/2022   Procedure: HYSTERECTOMY VAGINAL WITH BILATERAL SALPINGECTOMY;  Surgeon: Lovetta Debby PARAS, MD;  Location: [facility] ORS;  Service: Gynecology;  Laterality: N/A;    Social History:  Social History    Socioeconomic History   Marital status: Married    Spouse name: Not on file   Number of children: Not on file   Years of education: Not on file   Highest education level: Not on file  Occupational History   Not on file  Tobacco Use   Smoking status: Never   Smokeless tobacco: Never  Vaping Use   Vaping status: Never Used  Substance and Sexual Activity   Alcohol use: Not Currently   Drug use: Never   Sexual activity: Not on file  Other Topics Concern   Not on file  Social History Narrative   Not on file   Social Drivers of Health   Financial Resource Strain: Not on file  Food Insecurity: No Food Insecurity (03/22/2023)   Hunger Vital Sign    Worried About Running Out of Food in the Last Year: Never true    Ran Out of Food in the Last Year: Never true  Transportation Needs: No Transportation Needs (03/22/2023)   PRAPARE - Administrator, Civil Service (Medical): No    Lack of Transportation (Non-Medical): No  Physical Activity: Not on file  Stress: Not on file  Social Connections: Not on file  Intimate Partner Violence: Not At Risk (03/22/2023)   Humiliation, Afraid, Rape, and Kick questionnaire    Fear of Current or Ex-Partner: No    Emotionally Abused: No    Physically Abused: No    Sexually Abused: No    Family History:  Family History  Problem Relation Age of Onset   Breast cancer Neg Hx     Medications:   Current Outpatient Medications on File Prior to Visit  Medication Sig Dispense Refill   acetaminophen  (TYLENOL ) 500 MG tablet Take 500 mg by mouth as needed.     aspirin  EC 81 MG tablet Take 1 tablet (81 mg total) by mouth daily. Swallow whole. 30 tablet 0   atorvastatin  (LIPITOR) 40 MG tablet Take 40 mg by mouth daily.     diazePAM , 15 MG Dose, (VALTOCO  15 MG DOSE) 2 x 7.5 MG/0.1ML LQPK Place 15 mg into the nose as needed. 1 each 0   hydrocortisone 2.5 % cream Apply 1 Application topically 2 (two) times daily.     lamoTRIgine  50 MG TBDP Take  2 tablets (100 mg total) by mouth in the morning AND 3 tablets (150 mg total) every evening. 150 tablet 0   levETIRAcetam  (KEPPRA ) 500 MG tablet Take 2 tablets (1,000 mg total) by mouth every morning AND 3 tablets (1,500 mg total) at bedtime. 450 tablet 3   No current facility-administered medications on file prior to visit.    Allergies:  No Known Allergies    OBJECTIVE:  Physical Exam  There were no vitals filed for this visit.  There is no height or weight on file to calculate BMI. No results found.  General: well developed, well nourished, very pleasant middle-aged Hispanic female, seated, in no evident distress  Neurologic Exam Mental Status: Awake and fully alert. Limited English primarily Spanish-speaking. Denies dysarthria or aphasia.  Oriented to place and time. Recent and remote memory intact. Attention span, concentration and fund of knowledge appropriate. Mood and affect appropriate.  Cranial Nerves: Pupils equal, briskly reactive to light. Extraocular movements full without nystagmus. Visual fields right homonymous inferior quadrantanopia. Hearing intact. Face, tongue, palate moves normally and symmetrically.  Motor: Normal bulk and tone. Normal strength in all tested extremity muscles. Sensory.: intact to touch , pinprick , position and vibratory sensation Coordination: Rapid alternating movements normal in all extremities. Finger-to-nose and heel-to-shin performed accurately bilaterally.   Gait and Station: Arises from chair without difficulty. Stance is normal. Gait demonstrates normal stride length and balance without use of AD.  Reflexes: 1+ and symmetric. Toes downgoing.       ASSESSMENT: Erica Rosario is a 48 y.o. 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  On 04/04/2020 (d/c'd home from [facility] on 10/15 in stable condition) with worsening headache, N/V and generalized weakness found to have worsening vasogenic edema in left parieto-occipital region and worsening midline shift of 13 mm requiring reversal of warfarin, mannitol  x1, Decadron  and hypertonic saline with improvement.  Hospital course complicated by severe anemia, leukocytosis and fever, and elevated liver enzymes. Vascular risk factors include anemia, HLD and OCP use.       PLAN:  Sagittal sinus thrombosis w/ hemorrhagic transformation:  Residual deficit: Right inferior homonymous quadrantanopia MRV 08/24/2020 no evidence of residual thrombosis Continue aspirin  81 mg daily and atorvastatin  for secondary stroke prevention managed/prescribed by PCP Discussed secondary stroke prevention measures and importance of close PCP follow up for aggressive stroke risk factor management including HLD with LDL goal<70 Repeat hypercoagulable labs: Slightly elevated anticardiolipin IgM (14) likely clinically insignificant  Right-sided numbness: Has been persistent since seizure-like activity on 03/02/2022 with some improvement and now more associated with increased exertion.  CTH and CTA head/neck 03/02/2022 unremarkable.  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 Prior seizures: 12/2023 unprovoked, 02/2022 (in setting of missed Keppra  dosage), 08/2021 (tonic-clonic), 03/2021 (tonic-clonic), 03/2020 (onset, in  setting of hemorrhagic transformation) EEG 09/2021 unremarkable Discussed avoiding seizure provoking triggers and ensuring medication compliance Advised to call with any recurrent seizure activity. Discussed indications to call 911 with seizure activity  Chronic frontal headaches Only mild infrequent headaches Continue lamotrigine  100/150 Use of tylenol  as needed, advised to call if this should no longer be beneficial and can consider use of Nurtec or Ubrelvy  for rescue, triptans contraindicated due to stroke history    Follow-up in 9 months or call earlier if needed    CC:  Tobie Domino, MD    I spent 25 minutes of face-to-face and non-face-to-face time with patient and husband assisted by [facility] health interpreter.  This included previsit chart review, lab review, study review, order entry, electronic health record documentation, patient education and discussion regarding above diagnoses and treatment plan and answered all the questions to patient and husband satisfaction  Harlene Bogaert, AGNP-BC  [facility] [address] [place], [place] [address]  Phone [phone] Fax [phone] Note: This document was prepared with digital dictation and possible smart phrase technology. Any transcriptional errors that result from this process are unintentional.

## 2024-02-21 ENCOUNTER — Ambulatory Visit (INDEPENDENT_AMBULATORY_CARE_PROVIDER_SITE_OTHER): Admitting: Adult Health

## 2024-02-21 ENCOUNTER — Encounter: Payer: Self-pay | Admitting: Adult Health

## 2024-02-21 VITALS — BP 118/78 | HR 62 | Ht 59.06 in | Wt 141.0 lb

## 2024-02-21 DIAGNOSIS — G08 Intracranial and intraspinal phlebitis and thrombophlebitis: Secondary | ICD-10-CM

## 2024-02-21 DIAGNOSIS — G40909 Epilepsy, unspecified, not intractable, without status epilepticus: Secondary | ICD-10-CM | POA: Diagnosis not present

## 2024-02-21 DIAGNOSIS — Z5181 Encounter for therapeutic drug level monitoring: Secondary | ICD-10-CM

## 2024-02-21 DIAGNOSIS — I639 Cerebral infarction, unspecified: Secondary | ICD-10-CM

## 2024-02-21 MED ORDER — LAMOTRIGINE 100 MG PO TABS
ORAL_TABLET | ORAL | 11 refills | Status: AC
Start: 2024-02-21 — End: ?

## 2024-02-21 MED ORDER — VALTOCO 15 MG DOSE 2 X 7.5 MG/0.1ML NA LQPK: 15.0000 mg | NASAL | 5 refills | Status: AC | PRN

## 2024-02-21 NOTE — Patient Instructions (Addendum)
 Your Plan:  Continue lamotrigine  150mg  twice daily and Keppra  1000 mg AM and 1500mg  PM  We will check lamotrigine  levels today   Continue Valtoco  as needed for seizure rescue  Please call with any recurrent seizure activity        Follow up in January as scheduled       Thank you for coming to see us  at [facility]. I hope we have been able to provide you high quality care today.  You may receive a patient satisfaction survey over the next few weeks. We would appreciate your feedback and comments so that we may continue to improve ourselves and the health of our patients.

## 2024-02-22 ENCOUNTER — Ambulatory Visit: Payer: Self-pay | Admitting: Adult Health

## 2024-02-22 LAB — LAMOTRIGINE LEVEL: Lamotrigine Lvl: 5.2 ug/mL (ref 2.0–20.0)

## 2024-02-22 LAB — LEVETIRACETAM LEVEL: Levetiracetam Lvl: 18.2 ug/mL (ref 10.0–40.0)

## 2024-03-04 ENCOUNTER — Other Ambulatory Visit (HOSPITAL_COMMUNITY): Payer: Self-pay

## 2024-03-05 ENCOUNTER — Other Ambulatory Visit (HOSPITAL_COMMUNITY): Payer: Self-pay

## 2024-03-05 ENCOUNTER — Telehealth: Payer: Self-pay | Admitting: Pharmacy Technician

## 2024-03-05 NOTE — Telephone Encounter (Signed)
 Pharmacy Patient Advocate Encounter   Received notification from Patient Pharmacy that prior authorization for Valtoco  15mg  is required/requested.   Insurance verification completed.   The patient is insured through [facility] .   Per test claim: PA required; PA submitted to above mentioned insurance via Latent Key/confirmation #/EOC [id number] Status is pending

## 2024-03-05 NOTE — Telephone Encounter (Signed)
 Pharmacy Patient Advocate Encounter  Received notification from [employer] that Prior Authorization for Valtoco  has been APPROVED from 03/05/24 to 03/05/25. Spoke to pharmacy to process.Copay is $50.    PA #/Case ID/Reference #: [id number]

## 2024-03-07 ENCOUNTER — Ambulatory Visit: Payer: 59 | Admitting: Adult Health

## 2024-03-13 ENCOUNTER — Other Ambulatory Visit: Payer: Self-pay | Admitting: Family Medicine

## 2024-03-13 DIAGNOSIS — Z1231 Encounter for screening mammogram for malignant neoplasm of breast: Secondary | ICD-10-CM

## 2024-05-03 ENCOUNTER — Ambulatory Visit
Admission: RE | Admit: 2024-05-03 | Discharge: 2024-05-03 | Disposition: A | Discharge status: Home / Self Care | Source: Ambulatory Visit | Attending: Family Medicine | Admitting: Family Medicine

## 2024-05-03 DIAGNOSIS — Z1231 Encounter for screening mammogram for malignant neoplasm of breast: Secondary | ICD-10-CM | POA: Diagnosis present

## 2024-07-16 NOTE — Progress Notes (Unsigned)
 " Erica Rosario [address] [place]. [place] [address] [phone]       OFFICE FOLLOW UP NOTE  Erica Rosario Date of Birth:  [birth year] Medical Record Number:  [id number]   Reason for visit: Seizure and stroke follow-up    SUBJECTIVE:   CHIEF COMPLAINT:  No chief complaint on file.     HPI:   Update 07/17/2024 JM: Patient returns for follow-up visit.  Denies any recurrent seizures since prior visit.  Reports compliance on lamotrigine  100/150 and levetiracetam  1000/1500.        History provided for reference purposes only Update 02/21/2024 JM: Patient returns for sooner scheduled visit due to recent seizure.  She is accompanied by interpreter.  She was seen in the ED 7/22 after near syncopal episode and patient reporting right arm and leg weakness with tingling with gradual improvement of symptoms after self administering Valtoco . She reported typical presentation with seizures.  CTH negative for acute abnormalities.  She was seen by a neurologist Dr. Jerrie, no clear trigger for seizure therefore recommended increasing lamotrigine  from 100mg  BID to 100mg  AM and 150mg  PM and continuation of Keppra  1000/1500.   Since then, she has been doing well and has not had any additional seizures.  She reports taking lamotrigine  100 mg a.m. and 150 mg p.m., she never further increased to 150mg  BID as advised as she was not aware of this recommendation. She is tolerating increase dose of Lamictal  without side effects, she also remains on keppra  without side effects.  She does endorse increased stress which may have attributed to her seizure but denies any missed dosages, recent illness, infection or sleep deprivation.  Reports benefit with Valtoco  at onset of seizure with symptoms not as severe or prolonged.   Update 10/16/2023 JM: Patient returns for follow-up visit accompanied by her spouse, assisted by [facility] interpreter.  Denies any additional seizure activity.   Reports compliance on lamotrigine  100 mg twice daily and Keppra  1000/1500, tolerating without side effects.  Only experiencing occasional headaches, not severe, lasts only short duration.  No new stroke/TIA symptoms.  Reports compliance on aspirin  and atorvastatin .  Reports routine follow-up with PCP.  No questions or concerns at this time.  Update 03/23/2023 JM: Patient returns for sooner follow-up visit due to recent ED visit.  She is accompanied by her niece and [place] interpreter.  She presented to [facility] ED on 10/1 with right sided numbness and weakness, similar presentation 02/2022 and presumed secondary to seizure.  MRI brain negative for acute finding.  Evaluated by neurology Dr. 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  Recommended increased topiramate  dosage from 25mg  BID to 50 mg BID and continuation of levetiracetam  1000/1500.   Patient called 10/3 reporting issues with increased topiramate  dosage, reported nausea with vomiting, head pressure, lightheadedness and dizziness sensation. She felt due to higher topiramate  dosage as symptoms presented after increasing dose.  Patient self decreased topiramate  dosage back to 25 mg BID and noted improvement of symptoms along with Zofran  for nausea (rx'd by PCP). In setting of recent seizure, she was advised to increase nighttime dosage to 50mg  and continue 25mg  AM.   Denies any additional seizure activity. Is currently on topamax  25mg  AM and 50mg  PM, reports tolerating well over the past several days but last night started to have return of floating/lightheadedness sensation. Does admit to limted water intake, about 16-32oz per day.  Also notes initially feeling intense pressure in her head but this  has been gradually improving over the past few days. Remains on keppra  1000/1500.   Update 03/08/2023 JM: Patient returns for follow-up visit accompanied by daughter and [place] interpreter.  No recent seizure  activity on Keppra  1000/1500, no side effects.  Remains on topiramate  25 mg twice daily for headache prophylaxis, does have occasional headaches about once every 1-3 months.   Right sided numbness present with increased activity still. 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  Stable right peripheral visual impairment.  Compliant on aspirin  and atorvastatin .  Routinely follows with PCP for stroke risk factor management.   Update 08/31/2022 JM: Patient returns for 63-month follow-up.  She is accompanied by [facility] health interpreter.    Denies any recurrent seizure activity, remains on Keppra  1000/1500, denies side effects.  She occasionally continues to miss dosages but has been working on trying to be more diligent about this.  She reports continued right-sided numbness especially with increased exertion and will improve after resting.  At times her right hand will become numb and start to shake, can last all day. No always associated with full side right-sided numbness and denies any association with headaches. Usually in setting of increased stressors. Also mentions low back pain but denies radiating symptoms, also notes pain in both hands. Plans on scheduling f/u visit with PCP to further discuss.   Headaches have improved since prior visit, having about 1 headache every 2 months. Continues on topamax  25mg  twice daily, tolerating well. Can have headaches if thinking too much or trying to concentrate. Will use tylenol  with benefit.   Denies any new stroke/TIA symptoms.  Continued right peripheral impairment, stable since prior visit. Compliant on aspirin  and atorvastatin .  Blood pressure well-controlled.  Routinely follows with PCP.  Update 03/07/2022 JM: Patient returns for acute visit due to recent possible seizure.  She is accompanied by [facility] health interpreter.   She presented to [facility] ED on 9/13 complaining of episode of distress, describing it as a rising sensation in her stomach followed by a feeling of despair as well as weakness and numbness in both arms and legs but difficulty fully describing, while in ED multiple episodes where she became quite distressed and she would hold her right leg flexed, could be easily repositioned and able to move it voluntarily without increased tone during episode.  Evaluated by neurologist Dr. Michaela with symptoms consistent with epigastric rising/sense of impending doom concerning a partial seizure however multiple findings on exam which could be suspicious for nonorganic etiology of these events.  CT head and CTA head/neck unremarkable.  She did receive 2 mg of IV Ativan  and Keppra  load and remained episode free for multiple hours.  Did miss a dose of her medication that morning and due to difficulty tolerating higher dosage, recommended continuation of current Keppra  dosage.  She does report continued right sided numbness which has been persistent since she was in the hospital (describes complete numbness side of face, arm and leg). Has improved some since discharge. She has not had any additional seizure like activity since discharge. She does confirm she did miss her morning dose of keppra  prior to her seizure.  She has not missed any dosages since recent ED visit.  She does continue to experience headaches which was discussed at prior visit, did obtain lab work which did not show any signs of anemia (headaches previously present while anemic). She is interested  in starting prophylactic therapy.  Previously taking topiramate  with benefit.  Remains on aspirin  and atorvastatin .  Closely followed by PCP.  No further concerns at this time.   Update 03/01/2022 JM: Patient returns for 61-month stroke and seizure follow-up accompanied by [facility] health interpreter.  Overall stable without any reoccurring seizure activity or new  stroke/TIA symptoms.  She does complain of recurrence of typical frontal headaches which have worsened over the past 4 months.  Previously associated with iron  deficiency anemia.  Does report having issues with heavy menstrual cycles over the past few months and ended up undergoing hysterectomy on 8/3.  She did receive iron  infusion back in July, has not had repeat lab work since that time.  She is scheduled to follow-up with OB next week.  Compliant on Keppra  1000/1500, denies side effects.  Compliant on aspirin  and atorvastatin , denies side effects.  Blood pressure today 110/60.  No further concerns at this time  Update 09/14/2021 JM: Patient returns for acute visit due to recent seizure activity.  She is accompanied by her sister and [facility] interpreter.  She presented to [facility] ED on 09/10/2021 for a witnessed generalized tonic-clonic seizure lasting approximately 5 minutes.  She was confused and postictal upon arrival.  She had additional tonic-clonic seizure in ED lasting approximately 1 to 2 minutes, received IV Ativan  and Keppra  load.  Lab work largely within normal limits and CTH no acute abnormality.  Due to likely unprovoked seizure, Keppra  dosage increased to 1500 mg twice daily.  Since discharge, she has been doing well without any reoccurring seizure activity.  Remains on Keppra  1500 mg twice daily but does complain on dizziness and nauseous since starting which was a prior compliant on higher keppra  dosage. Prior to seizure onset, denies any missed dosages, significantly elevated stress levels, recent medication changes or any potential seizure provoking triggers.   Update 07/26/2021 JM: Returns for follow-up visit after prior visit 4 months ago with Dr. Rosemarie for recent breakthrough seizure and hx of stroke. She is accompanied by a friend and [facility] interpreter.  Overall stable since prior visit without any additional seizure activity, compliant on Keppra  500/1000mg  without side  effects (prior difficulty tolerating 1000mg  BID dosing which improved after dose adjustment). EEG 04/2021 unremarkable without evidence of seizures.  Stable from stroke standpoint without new stroke/TIA symptoms and stable residual deficit of right peripheral visual impairment.  Compliant on aspirin  and atorvastatin  without side effects.  Blood pressure today 106/72. At prior visit, topamax  discontinued as no headaches but shortly after stopping, expericing reoccurring headaches - PCP restarted Topamax , headaches stable but can occur with increased stress. Recently started on venlafaxine for premenopausal symptoms.  No new concerns at this time.   Update 04/07/2021 Dr. Rosemarie Patient returns for follow-up after last visit 2 months ago.  She is accompanied by brother and Spanish language interpreter was present throughout this visit.  Patient was seen in the ER on 04/03/2021 with breakthrough seizures.  She presented with visual problems which was similar to what she had during her previous episode of venous sinus thrombosis and subsequently went on to have multiple seizures.  Patient denied missing any recent doses of medicines.  She was loaded with 2 g of Keppra  and Ativan  2 mg.  Her maintenance dose of Keppra  was increased to 1 g twice daily.  Patient states she does not remember the episode of the hospital visit.  She states she is done well since then and has not had any  further recurrent seizures but she complains of feeling dizzy and having nausea and trouble tolerating the higher dose.  She did also have CT scan of the head as well as CT angiogram of the neck and brain both of which were unremarkable on 04/03/2021.  Patient denies significant headaches or numbness but she is on Topamax  25 mg twice daily and is wondering if she can stop it.  She has no new complaints today.  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  Update 01/18/2021 JM: Erica Rosario returns for 36-month stroke follow-up accompanied by sister and [facility] interpreter.  She was evaluated at [facility] ED 7/29 for dizziness and occipital and frontal headache associated with bright lights in right eye over the past week.  Seen in ED for similar symptoms on 3/7 and diagnosed with iron  deficiency anemia possibly in setting of heavy menstrual cycle.  Started menstrual cycle on 7/23 but symptoms much worse compared to prior symptoms. Dizziness improved after meclizine  and received IM Reglan  for headache.  CT head unremarkable for acute findings.  Lab work unremarkable.  Symptoms have since resolved.  Is scheduled for IUD implant on 8/8 due to continued heavy irregular menstrual cycles.  Of note, she did have IUD placed 7/12 to help control menstrual cycles with heavy bleeding.  She describes the dizziness as a lightheaded feeling.  She has had vertigo in the past and reports different dizziness symptoms associated with menstrual cycle.  Residual stroke deficit right peripheral visual impairment stable.  Denies any other new stroke/TIA symptoms. Compliant on warfarin with reported heavy menstrual cycle since starting and atorvastatin  without associated side effects. Blood pressure today 110/66. Reports complaince on keppra  500mg  BID without associated side effects and denies any seizure activity.  No further concerns at this time.  Update 09/09/2020 JM: Erica Rosario returns for stroke follow-up after prior visit approximately 4 months ago accompanied by interpreter.   Residual deficits of mild right peripheral visual impairment but is improving.  She denies residual right hand numbness Evaluated in the ED 08/24/2020 with headache, dizziness and visual complaints. MRI and MRV unremarkable for acute findings.  Noted to be anemic with low iron  levels which was felt possibly related to heavy menstrual cycles. Headache and dizziness have since resolved. She  has since f/u with OB/GYN with plans on placing IUD to help control menstrual cycles.  Also follow-up with PCP who initiated ferrous sulfate . Has f/u next week for repeat lab work Denies new stroke/TIA symptoms  reports bilateral hand finger joint pain with increased stiffness and quick lasting tremor with extension of the fingers upon awakening which has been present over the past month.  Denies any weakness, pain in other locations such as wrist or arm or numbness/tingling  Continues on Keppra  500 mg twice daily -tolerating denies any recent seizure activity  Remains on warfarin with INR levels monitored by PCP- per patient, INR level stable - unable to view via epic - aside from heavy menstrual cycles, no other bleeding or bruising concerns Remains on atorvastatin  -denies associated side effects  Blood pressure today 110/67 - does not routinely monitor at home  No further concerns at this time  Initial visit 05/12/2020 JM: Erica Rosario is being seen for hospital follow-up accompanied by her brother and interpreter. She reports she has been improving but continues to have right hand numbness, right peripheral visual  impairment and gait impairment due to visual difficulty. She has not done any therapies since discharge due to lack of insurance.  She is in the process of applying for [employer] financial assistance as well as other insurance..  Denies new or worsening stroke/TIA symptoms. 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  She lives with her husband and 2 sons able to maintain ADLs independently and has been slowly returning to completing IADLs.  She continues on Keppra  500 mg twice daily tolerating well and denies any reoccurring seizure activity.  She remains on warfarin without bleeding or bruising with INR levels monitored by PCP weekly. Per patient, INR levels have been greater than 3 so currently doing dosage adjustments per PCP. Remains on  atorvastatin  without myalgias. Blood pressure today 114/68 - not routinely monitored at home.  She has since discontinued OCP.  No further concerns at this time.  Stroke admission 03/25/2020 JM: Erica Rosario is a 49 y.o. female with no PMH  who presented to [facility] with HA and neck stiffness on 03/25/2020.   Personally reviewed hospitalization pertinent progress notes, lab work and imaging with summary provided.  Shortly transferred to [facility] and evaluated by Dr. 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  Repeat CT head showed hemorrhagic transformation L parietal with slight increase edema with most hemorrhagic foci unchanged and single new hemorrhage at vertex.  Repeat CT head 3 hours later stable. Pan CT negative for malignancy but did show B renal vein nonocclusive thrombosis. CTV and CTA h/n negative LVO with partial thrombosis of the superior sagittal sinus at the vertex, nonocclusive as well can be demonstrated around the margins of the thrombus with patency beyond that.  Also showed thrombosed superficial draining vein of the left again visible, similar in appearance with low density edema slightly increased and mild swelling with mass-effect and 1 to 2 mm of left-to-right shift and flattening of the left lateral ventricle.  Questionable hypercoagulable state with hypercoagulable showed anticardiolipin IgM 19 (indeterminate) and protein S total and active mildly low but likely inaccurate on heparin .  Advise to discontinue OCP use.  Placed on heparin  IV and warfarin daily. GTC in ER s/p Ativan  and Keppra  load and initiated Keppra  500 mg twice daily with LTM showing left frontal temporal continuous slowing and bilateral parieto-occipital spikes.  LDL UTC with direct LDL of 151 no statin PTA and initiated atorvastatin  40 mg daily.  No prior history  of HTN or DM.  Other stroke risk factors include history of EtOH use and overweight but no prior stroke history.  Other active problems include anemia, leukocytosis and hypokalemia.  Evaluated by therapies and recommended discharge to CIR for ongoing therapy needs.  She was discharged to CIR on 03/31/2020 and discharged home on 04/03/2020 on Keppra , Topamax  and warfarin.    She unfortunately returned on 04/04/2020 with complaint of headache, generalized weakness and faintness as well as nauseated and several episodes of emesis but no focal deficits noted.  She was found to have worsening vasogenic edema in left parieto-occipital region and worsening midline shift of 13 mm.  Warfarin discontinued and reversed with vitamin K.  Administering mannitolx1, Decadron , hypertonic saline and heparin  drip.  Repeat CT head showed improved MLS, cerebral edema and decreased hydrocephalus and again repeat CT head showed stable edema with decreasing IPH and  same 1 cm midline shift. NSG on board PRN.  Also found to have severe anemia receiving 2U PRBC with improvement potentially contributing to syncopal event on 10/15 at home.  Elevated liver enzymes of unclear etiology possibly related to Topamax  therefore DC'd.  Restarted warfarin with INR goal 2-3 in addition to Lovenox  for total of 4 doses.  Advised to follow-up with [facility] on 10/25 for pro time blood test and CBC as well as INR levels and adjustment of warfarin as indicated and possible discontinuation of Lovenox .  Discharged on dexamethasone .  Reevaluated by therapy recommended outpatient OT and no PT needs.  She was discharged home on 04/11/2020.   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 hemorrhagic infarct w/ surrounded edema MRI  Superior sagittal sinus thrombosis w/ hemorrhagic  transformation L occipital venous infarct. Also thrombosis draining cortical vein. 2 areas enhancement in L occipital hemorrhage possible ongoing hemorrhage. Similar edema w/ slight R shift. CTA and CTV 10/9 - Partial thrombosis of the superior sagittal sinus at the vertex, Thrombosed superficial draining vein on the left, Hemorrhagic infarction in the left parietal cortical and subcortical brain  Pan CT neg for malignancy, B renal vein nonocclusive thrombosis. CTV 10/16 Decreased volume of nonocclusive superior sagittal sinus Thrombus. Decreased conspicuity of thrombus within an adjacent left cortical vein. Repeat CT head improved MLS, cerebral edema and decreased hydrocephalus  2D Echo EF 60-65%. No source of embolus  LE dopplers 10/19 - no DVT  LDL UTC, TC 210, TG 132, direct LDL 151.9 HgbA1c 5.6 UDS +benzos Hypercoagulable labs unremarkable (anticardiolipin IgM slightly elevated, prot S total and activity mildly low - not accurate on heparin ) VTE prophylaxis - heparin  IV   No antithrombotic prior to admission, now on full dose lovenox . On coumadin  with INR goal 2-3.  Today INR 1.5 Decadron  4mg  IV q6h ->2mg  IV q 6h -> 2mg  po Q6h -> 2mg  Q8  Therapy recommendations:  Outpt OT, no PT - gave pt info for pro bono therapies at [facility] clinic Disposition:  home  Cerebral Edema CT head Left parieto-occipital venous infarct with increased edema and mass effect including 13 mm of rightward midline shift. New mild dilatation of the right lateral ventricle suggesting early trapping Repeat CT improved MLS, cerebral edema and decreased hydrocephalus CT repeat stable edema, decreasing IPH. Same 1cm midline shift  NSG on board PRN S/p mannitol  x 1 Decadron  10mg  x 1 -> 4mg  Q6 -> 2mg  Q6 ->2mg  Q8 -> tapering On 3% at 75 -> 40 cc / hr -> 20cc->off Increase topomax to 50mg  bid -> d/c PICC 10/18->10/22 removed      ROS:   14 system review of systems performed and negative with exception of those  listed in HPI  PMH:  Past Medical History:  Diagnosis Date   Acute focal neurological deficit 04/02/2021   Anemia    Hemorrhagic cerebrovascular accident (CVA) (HCC) 03/25/2020   Seizures (HCC)    Stroke (HCC)    Thrombosis of superior sagittal sinus 03/25/2020   a.) thrombosis of the posterior aspect of the superior sagittal sinus with hemorrhagic transformation of a venous infarct in the LEFT occipital lobe; (+) vasogenic edema with regional mass effect resulting in LEFT to RIGHT midline shift    PSH:  Past Surgical History:  Procedure Laterality Date   VAGINAL HYSTERECTOMY N/A 01/20/2022  Procedure: HYSTERECTOMY VAGINAL WITH BILATERAL SALPINGECTOMY;  Surgeon: Schermerhorn, Debby PARAS, MD;  Location: [facility] ORS;  Service: Gynecology;  Laterality: N/A;    Social History:  Social History   Socioeconomic History   Marital status: Married    Spouse name: Not on file   Number of children: Not on file   Years of education: Not on file   Highest education level: Not on file  Occupational History   Not on file  Tobacco Use   Smoking status: Never   Smokeless tobacco: Never  Vaping Use   Vaping status: Never Used  Substance and Sexual Activity   Alcohol use: Not Currently   Drug use: Never   Sexual activity: Not on file  Other Topics Concern   Not on file  Social History Narrative   Not on file   Social Drivers of Health   Tobacco Use: Low Risk (02/21/2024)   Patient History    Smoking Tobacco Use: Never    Smokeless Tobacco Use: Never    Passive Exposure: Not on file  Financial Resource Strain: Not on file  Food Insecurity: No Food Insecurity (03/22/2023)   Hunger Vital Sign    Worried About Running Out of Food in the Last Year: Never true    Ran Out of Food in the Last Year: Never true  Transportation Needs: No Transportation Needs (03/22/2023)   PRAPARE - Administrator, Civil Service (Medical): No    Lack of Transportation (Non-Medical): No  Physical  Activity: Not on file  Stress: Not on file  Social Connections: Not on file  Intimate Partner Violence: Not At Risk (03/22/2023)   Humiliation, Afraid, Rape, and Kick questionnaire    Fear of Current or Ex-Partner: No    Emotionally Abused: No    Physically Abused: No    Sexually Abused: No  Depression (PHQ2-9): Not on file  Alcohol Screen: Not on file  Housing: Low Risk (03/22/2023)   Housing    Last Housing Risk Score: 0  Utilities: Not At Risk (03/22/2023)   [facility] Utilities    Threatened with loss of utilities: No  Health Literacy: Not on file    Family History:  Family History  Problem Relation Age of Onset   Breast cancer Neg Hx     Medications:   Current Outpatient Medications on File Prior to Visit  Medication Sig Dispense Refill   acetaminophen  (TYLENOL ) 500 MG tablet Take 500 mg by mouth as needed.     aspirin  EC 81 MG tablet Take 1 tablet (81 mg total) by mouth daily. Swallow whole. 30 tablet 0   atorvastatin  (LIPITOR) 40 MG tablet Take 40 mg by mouth daily.     diazePAM , 15 MG Dose, (VALTOCO  15 MG DOSE) 2 x 7.5 MG/0.1ML LQPK Place 15 mg into the nose as needed. Given 15 mg (2x 7.5mg ) IN at the onset of seizure, can repeat after 4 hours if needed 10 each 5   hydrocortisone 2.5 % cream Apply 1 Application topically 2 (two) times daily.     lamoTRIgine  (LAMICTAL ) 100 MG tablet Take 1 tablet (100 mg total) by mouth daily AND 1.5 tablets (150 mg total) at bedtime. 75 tablet 11   levETIRAcetam  (KEPPRA ) 500 MG tablet Take 2 tablets (1,000 mg total) by mouth every morning AND 3 tablets (1,500 mg total) at bedtime. 450 tablet 3   No current facility-administered medications on file prior to visit.    Allergies:  No Known Allergies    OBJECTIVE:  Physical Exam  There were no vitals filed for this visit.  There is no height or weight on file to calculate BMI. No results found.  General: well developed, well nourished, very pleasant middle-aged Hispanic female, seated,  in no evident distress  Neurologic Exam Mental Status: Awake and fully alert. Limited English primarily Spanish-speaking. Denies dysarthria or aphasia.  Oriented to place and time. Recent and remote memory intact. Attention span, concentration and fund of knowledge appropriate. Mood and affect appropriate.  Cranial Nerves: Pupils equal, briskly reactive to light. Extraocular movements full without nystagmus. Visual fields right homonymous inferior quadrantanopia. Hearing intact. Face, tongue, palate moves normally and symmetrically.  Motor: Normal bulk and tone. Normal strength in all tested extremity muscles. Sensory.: intact to touch , pinprick , position and vibratory sensation Coordination: Rapid alternating movements normal in all extremities. Finger-to-nose and heel-to-shin performed accurately bilaterally.   Gait and Station: Arises from chair without difficulty. Stance is normal. Gait demonstrates normal stride length and balance without use of AD.  Reflexes: 1+ and symmetric. Toes downgoing.       ASSESSMENT: Erica Rosario is a 49 y.o. 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 On 04/04/2020 (d/c'd home from [facility] on 10/15 in stable condition) with worsening headache, N/V and generalized weakness found to have worsening vasogenic edema in left parieto-occipital region and worsening midline shift of 13 mm requiring reversal of warfarin, mannitol  x1, Decadron  and hypertonic saline with improvement.  Hospital course complicated by severe anemia, leukocytosis and fever, and elevated liver enzymes. Vascular risk factors include anemia, HLD and OCP use.       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 Continue lamotrigine  100mg  AM and 150mg  PM as no additional seizures (was advised to increase to 150mg  BID in ED but never did, low threshold to increase in the future if needed) - refill provided Continue levetiracetam  1000mg  AM and 1500mg  PM - can consider dosage reduction at follow-up visit if she remains seizure-free Continue Valtoco  15mg  as needed for seizure rescue - refill provided Lab work 02/2024: Lamotrigine  5.2, levetiracetam  18.2 Advised to call with any recurrent seizure activity  Advised no driving for 6 months post seizure per [place] law   Sagittal sinus thrombosis w/ hemorrhagic transformation:  Residual deficit: Right inferior homonymous quadrantanopia MRV 08/24/2020 no evidence of residual thrombosis Continue aspirin  81 mg daily and atorvastatin  for secondary stroke prevention managed/prescribed by PCP Discussed secondary stroke prevention measures and importance of close PCP follow up for aggressive stroke risk factor management including HLD with LDL goal<70 Repeat hypercoagulable labs: Slightly elevated anticardiolipin IgM (14) likely clinically insignificant  Chronic frontal headaches Was not discussed today due to acute visit      Follow-up in January as scheduled or call earlier if needed     CC:  Tobie Domino, MD      Harlene Bogaert, AGNP-BC  [facility] [address] [place], [place] [address]  Phone [phone] Fax [phone] Note: This document was prepared with digital dictation and possible smart phrase technology. Any transcriptional errors that result from this process are unintentional.  "

## 2024-07-17 ENCOUNTER — Encounter: Payer: Self-pay | Admitting: Adult Health

## 2024-07-17 ENCOUNTER — Ambulatory Visit: Admitting: Adult Health

## 2024-07-17 VITALS — BP 115/63 | HR 53 | Ht 59.06 in | Wt 139.2 lb

## 2024-07-17 DIAGNOSIS — R519 Headache, unspecified: Secondary | ICD-10-CM

## 2024-07-17 DIAGNOSIS — G08 Intracranial and intraspinal phlebitis and thrombophlebitis: Secondary | ICD-10-CM

## 2024-07-17 DIAGNOSIS — I639 Cerebral infarction, unspecified: Secondary | ICD-10-CM | POA: Diagnosis not present

## 2024-07-17 DIAGNOSIS — G40909 Epilepsy, unspecified, not intractable, without status epilepticus: Secondary | ICD-10-CM

## 2024-07-17 MED ORDER — LEVETIRACETAM 500 MG PO TABS: ORAL_TABLET | ORAL | 3 refills | Status: AC

## 2025-02-05 ENCOUNTER — Ambulatory Visit: Admitting: Adult Health
